# Patient Record
Sex: Male | Born: 1955 | Race: White | Hispanic: No | Marital: Married | Smoking: Never smoker
Health system: Southern US, Community
[De-identification: ages and names within clinical notes are randomized; demographics above are authoritative.]

## PROBLEM LIST (undated history)

## (undated) DIAGNOSIS — F329 Major depressive disorder, single episode, unspecified: Secondary | ICD-10-CM

## (undated) DIAGNOSIS — Z87438 Personal history of other diseases of male genital organs: Secondary | ICD-10-CM

## (undated) DIAGNOSIS — I38 Endocarditis, valve unspecified: Secondary | ICD-10-CM

## (undated) DIAGNOSIS — K219 Gastro-esophageal reflux disease without esophagitis: Secondary | ICD-10-CM

## (undated) DIAGNOSIS — F32A Depression, unspecified: Secondary | ICD-10-CM

## (undated) DIAGNOSIS — I639 Cerebral infarction, unspecified: Secondary | ICD-10-CM

## (undated) DIAGNOSIS — N2889 Other specified disorders of kidney and ureter: Secondary | ICD-10-CM

## (undated) DIAGNOSIS — C801 Malignant (primary) neoplasm, unspecified: Secondary | ICD-10-CM

## (undated) DIAGNOSIS — A4902 Methicillin resistant Staphylococcus aureus infection, unspecified site: Secondary | ICD-10-CM

## (undated) DIAGNOSIS — I10 Essential (primary) hypertension: Secondary | ICD-10-CM

## (undated) DIAGNOSIS — I340 Nonrheumatic mitral (valve) insufficiency: Secondary | ICD-10-CM

## (undated) DIAGNOSIS — E785 Hyperlipidemia, unspecified: Secondary | ICD-10-CM

## (undated) DIAGNOSIS — R7881 Bacteremia: Secondary | ICD-10-CM

## (undated) DIAGNOSIS — Z87442 Personal history of urinary calculi: Secondary | ICD-10-CM

## (undated) DIAGNOSIS — I219 Acute myocardial infarction, unspecified: Secondary | ICD-10-CM

## (undated) DIAGNOSIS — N189 Chronic kidney disease, unspecified: Secondary | ICD-10-CM

## (undated) HISTORY — DX: Endocarditis, valve unspecified: I38

## (undated) HISTORY — PX: TONSILLECTOMY: SUR1361

## (undated) HISTORY — PX: MITRAL VALVE REPAIR: SHX2039

## (undated) HISTORY — PX: NO PAST SURGERIES: SHX2092

## (undated) HISTORY — DX: Nonrheumatic mitral (valve) insufficiency: I34.0

## (undated) HISTORY — PX: CARDIAC CATHETERIZATION: SHX172

## (undated) HISTORY — DX: Acute myocardial infarction, unspecified: I21.9

## (undated) HISTORY — DX: Hyperlipidemia, unspecified: E78.5

## (undated) HISTORY — DX: Cerebral infarction, unspecified: I63.9

## (undated) HISTORY — DX: Other specified disorders of kidney and ureter: N28.89

## (undated) HISTORY — DX: Personal history of other diseases of male genital organs: Z87.438

---

## 2006-01-01 DIAGNOSIS — Z87442 Personal history of urinary calculi: Secondary | ICD-10-CM

## 2006-01-01 HISTORY — DX: Personal history of urinary calculi: Z87.442

## 2006-01-22 ENCOUNTER — Encounter: Admission: RE | Admit: 2006-01-22 | Discharge: 2006-01-22 | Payer: Self-pay | Admitting: Family Medicine

## 2006-03-04 ENCOUNTER — Ambulatory Visit (HOSPITAL_BASED_OUTPATIENT_CLINIC_OR_DEPARTMENT_OTHER): Admission: RE | Admit: 2006-03-04 | Discharge: 2006-03-04 | Payer: Self-pay | Admitting: Urology

## 2008-02-16 ENCOUNTER — Inpatient Hospital Stay (HOSPITAL_COMMUNITY): Admission: EM | Admit: 2008-02-16 | Discharge: 2008-03-08 | Payer: Self-pay | Admitting: Emergency Medicine

## 2008-02-16 ENCOUNTER — Ambulatory Visit: Payer: Self-pay | Admitting: Pulmonary Disease

## 2008-02-18 ENCOUNTER — Encounter (INDEPENDENT_AMBULATORY_CARE_PROVIDER_SITE_OTHER): Payer: Self-pay | Admitting: Cardiology

## 2008-02-19 ENCOUNTER — Ambulatory Visit: Payer: Self-pay | Admitting: Infectious Diseases

## 2008-02-19 ENCOUNTER — Encounter (INDEPENDENT_AMBULATORY_CARE_PROVIDER_SITE_OTHER): Payer: Self-pay | Admitting: Internal Medicine

## 2008-02-20 ENCOUNTER — Encounter (INDEPENDENT_AMBULATORY_CARE_PROVIDER_SITE_OTHER): Payer: Self-pay | Admitting: Neurology

## 2008-02-25 ENCOUNTER — Ambulatory Visit: Payer: Self-pay | Admitting: Physical Medicine & Rehabilitation

## 2008-03-08 ENCOUNTER — Inpatient Hospital Stay (HOSPITAL_COMMUNITY)
Admission: RE | Admit: 2008-03-08 | Discharge: 2008-04-30 | Payer: Self-pay | Admitting: Physical Medicine & Rehabilitation

## 2008-03-08 ENCOUNTER — Ambulatory Visit: Payer: Self-pay | Admitting: Physical Medicine & Rehabilitation

## 2008-03-17 ENCOUNTER — Encounter (INDEPENDENT_AMBULATORY_CARE_PROVIDER_SITE_OTHER): Payer: Self-pay | Admitting: Cardiology

## 2008-04-10 ENCOUNTER — Ambulatory Visit: Payer: Self-pay | Admitting: Physical Medicine & Rehabilitation

## 2008-04-28 ENCOUNTER — Ambulatory Visit: Payer: Self-pay | Admitting: Physical Medicine & Rehabilitation

## 2008-05-13 ENCOUNTER — Encounter: Payer: Self-pay | Admitting: Infectious Diseases

## 2008-06-11 ENCOUNTER — Ambulatory Visit: Payer: Self-pay | Admitting: Infectious Diseases

## 2008-06-11 DIAGNOSIS — L678 Other hair color and hair shaft abnormalities: Secondary | ICD-10-CM | POA: Insufficient documentation

## 2008-06-11 DIAGNOSIS — I10 Essential (primary) hypertension: Secondary | ICD-10-CM | POA: Insufficient documentation

## 2008-06-11 DIAGNOSIS — I330 Acute and subacute infective endocarditis: Secondary | ICD-10-CM | POA: Insufficient documentation

## 2008-06-11 DIAGNOSIS — L738 Other specified follicular disorders: Secondary | ICD-10-CM | POA: Insufficient documentation

## 2008-06-11 DIAGNOSIS — Z87442 Personal history of urinary calculi: Secondary | ICD-10-CM | POA: Insufficient documentation

## 2008-06-11 DIAGNOSIS — E785 Hyperlipidemia, unspecified: Secondary | ICD-10-CM | POA: Insufficient documentation

## 2008-06-18 ENCOUNTER — Ambulatory Visit: Payer: Self-pay | Admitting: Internal Medicine

## 2008-06-18 DIAGNOSIS — I6340 Cerebral infarction due to embolism of unspecified cerebral artery: Secondary | ICD-10-CM | POA: Insufficient documentation

## 2008-06-18 DIAGNOSIS — I2109 ST elevation (STEMI) myocardial infarction involving other coronary artery of anterior wall: Secondary | ICD-10-CM | POA: Insufficient documentation

## 2008-06-28 ENCOUNTER — Telehealth: Payer: Self-pay | Admitting: Internal Medicine

## 2008-06-28 ENCOUNTER — Ambulatory Visit: Payer: Self-pay | Admitting: Psychology

## 2008-06-30 ENCOUNTER — Encounter
Admission: RE | Admit: 2008-06-30 | Discharge: 2008-09-28 | Payer: Self-pay | Admitting: Physical Medicine & Rehabilitation

## 2008-07-07 ENCOUNTER — Ambulatory Visit: Payer: Self-pay | Admitting: Internal Medicine

## 2008-07-08 ENCOUNTER — Encounter: Payer: Self-pay | Admitting: Internal Medicine

## 2008-07-08 ENCOUNTER — Ambulatory Visit (HOSPITAL_COMMUNITY): Admission: RE | Admit: 2008-07-08 | Discharge: 2008-07-08 | Payer: Self-pay | Admitting: Internal Medicine

## 2008-07-16 ENCOUNTER — Encounter
Admission: RE | Admit: 2008-07-16 | Discharge: 2008-10-14 | Payer: Self-pay | Admitting: Physical Medicine & Rehabilitation

## 2008-07-20 ENCOUNTER — Ambulatory Visit: Payer: Self-pay | Admitting: Physical Medicine & Rehabilitation

## 2008-08-25 ENCOUNTER — Ambulatory Visit: Payer: Self-pay | Admitting: Physical Medicine & Rehabilitation

## 2008-08-25 DIAGNOSIS — I059 Rheumatic mitral valve disease, unspecified: Secondary | ICD-10-CM | POA: Insufficient documentation

## 2008-08-25 DIAGNOSIS — R079 Chest pain, unspecified: Secondary | ICD-10-CM | POA: Insufficient documentation

## 2008-08-25 DIAGNOSIS — R1310 Dysphagia, unspecified: Secondary | ICD-10-CM | POA: Insufficient documentation

## 2008-08-25 DIAGNOSIS — R569 Unspecified convulsions: Secondary | ICD-10-CM | POA: Insufficient documentation

## 2008-08-25 DIAGNOSIS — I82409 Acute embolism and thrombosis of unspecified deep veins of unspecified lower extremity: Secondary | ICD-10-CM | POA: Insufficient documentation

## 2008-08-26 ENCOUNTER — Ambulatory Visit: Payer: Self-pay | Admitting: Psychology

## 2008-08-26 ENCOUNTER — Ambulatory Visit: Payer: Self-pay | Admitting: Internal Medicine

## 2008-10-19 ENCOUNTER — Encounter
Admission: RE | Admit: 2008-10-19 | Discharge: 2008-12-22 | Payer: Self-pay | Admitting: Physical Medicine & Rehabilitation

## 2008-10-25 ENCOUNTER — Telehealth (INDEPENDENT_AMBULATORY_CARE_PROVIDER_SITE_OTHER): Payer: Self-pay | Admitting: *Deleted

## 2008-11-19 ENCOUNTER — Ambulatory Visit: Payer: Self-pay | Admitting: Physical Medicine & Rehabilitation

## 2009-01-07 ENCOUNTER — Ambulatory Visit: Payer: Self-pay | Admitting: Internal Medicine

## 2009-01-13 ENCOUNTER — Ambulatory Visit: Payer: Self-pay | Admitting: Internal Medicine

## 2009-01-17 LAB — CONVERTED CEMR LAB
AST: 17 units/L (ref 0–37)
LDL Cholesterol: 94 mg/dL (ref 0–99)
Total CHOL/HDL Ratio: 2
Triglycerides: 48 mg/dL (ref 0.0–149.0)

## 2009-02-08 ENCOUNTER — Telehealth (INDEPENDENT_AMBULATORY_CARE_PROVIDER_SITE_OTHER): Payer: Self-pay | Admitting: *Deleted

## 2009-02-18 ENCOUNTER — Encounter
Admission: RE | Admit: 2009-02-18 | Discharge: 2009-02-23 | Payer: Self-pay | Admitting: Physical Medicine & Rehabilitation

## 2009-02-23 ENCOUNTER — Ambulatory Visit: Payer: Self-pay | Admitting: Physical Medicine & Rehabilitation

## 2009-02-24 ENCOUNTER — Encounter: Payer: Self-pay | Admitting: Internal Medicine

## 2009-04-01 ENCOUNTER — Encounter: Payer: Self-pay | Admitting: Internal Medicine

## 2009-04-12 ENCOUNTER — Telehealth: Payer: Self-pay | Admitting: Internal Medicine

## 2009-04-25 ENCOUNTER — Telehealth: Payer: Self-pay | Admitting: Internal Medicine

## 2009-05-13 ENCOUNTER — Encounter (INDEPENDENT_AMBULATORY_CARE_PROVIDER_SITE_OTHER): Payer: Self-pay | Admitting: *Deleted

## 2009-07-18 ENCOUNTER — Ambulatory Visit (HOSPITAL_COMMUNITY): Admission: RE | Admit: 2009-07-18 | Discharge: 2009-07-18 | Payer: Self-pay | Admitting: Internal Medicine

## 2009-07-18 ENCOUNTER — Ambulatory Visit: Payer: Self-pay | Admitting: Internal Medicine

## 2009-07-18 ENCOUNTER — Encounter: Payer: Self-pay | Admitting: Internal Medicine

## 2009-07-18 ENCOUNTER — Ambulatory Visit: Payer: Self-pay

## 2009-07-18 ENCOUNTER — Ambulatory Visit: Payer: Self-pay | Admitting: Cardiovascular Disease

## 2010-01-13 ENCOUNTER — Encounter: Payer: Self-pay | Admitting: Internal Medicine

## 2010-01-13 ENCOUNTER — Ambulatory Visit
Admission: RE | Admit: 2010-01-13 | Discharge: 2010-01-13 | Payer: Self-pay | Source: Home / Self Care | Attending: Internal Medicine | Admitting: Internal Medicine

## 2010-01-19 ENCOUNTER — Ambulatory Visit
Admission: RE | Admit: 2010-01-19 | Discharge: 2010-01-19 | Payer: Self-pay | Source: Home / Self Care | Attending: Internal Medicine | Admitting: Internal Medicine

## 2010-01-19 ENCOUNTER — Other Ambulatory Visit: Payer: Self-pay | Admitting: Internal Medicine

## 2010-01-19 LAB — LIPID PANEL
Cholesterol: 241 mg/dL — ABNORMAL HIGH (ref 0–200)
HDL: 56.1 mg/dL (ref >39.00)
Total CHOL/HDL Ratio: 4
Triglycerides: 112 mg/dL (ref 0.0–149.0)
VLDL: 22.4 mg/dL (ref 0.0–40.0)

## 2010-01-19 LAB — LDL CHOLESTEROL, DIRECT: Direct LDL: 164.1 mg/dL

## 2010-01-23 ENCOUNTER — Encounter: Payer: Self-pay | Admitting: Internal Medicine

## 2010-02-02 NOTE — Letter (Signed)
Summary: Dr Marden Noble' Office Note  Dr Marden Noble' Office Note   Imported By: Roderic Ovens 03/17/2009 11:39:25  _____________________________________________________________________  External Attachment:    Type:   Image     Comment:   External Document

## 2010-02-02 NOTE — Progress Notes (Signed)
  Request recieved from [employer] forwarded to [facility] for processing' [name]  February 08, 2009 8:36 AM

## 2010-02-02 NOTE — Assessment & Plan Note (Signed)
Summary: 6 MO F/U-W/ECHO SAME DAY   Visit Type:  6 months follow up Primary Provider:  Dr gates  CC:  No complains.  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  I last saw him in clinic about 6 months ago. SInce seen he has done well from a cardiac standpoint.  He denies chest pains.  Breathing is good.  NO DOE.  No PND.  NO palpitations.  He works out at [employer] regularly.  Current Medications (verified): 1)  Pristiq 50 Mg Xr24h-Tab (Desvenlafaxine Succinate) .... Once Daily 2)  Zocor 80 Mg Tabs (Simvastatin) .Marland Kitchen.. 1 Tablet At Bedtime 3)  Metoprolol Succinate 25 Mg Xr24h-Tab (Metoprolol Succinate) .... Take 1/2 Tablet Once A Day 4)  Nortriptyline Hcl 50 Mg Caps (Nortriptyline Hcl) .... Take 1 Capsule By Mouth Once A Day 5)  Bayer Childrens Aspirin 81 Mg Chew (Aspirin) .... Take 1 Tablet By Mouth Once A Day  Allergies (verified): No Known Drug Allergies  Past History:  Past medical, surgical, family and social histories (including risk factors) reviewed, and no changes noted (except as noted below).  Past Medical History: Reviewed history from 08/25/2008 and no changes required. Endocarditis- MV vegitation Myocardial infarction Hemmorhagic CVA   Family History: Reviewed history from 06/11/2008 and no changes required. mom with RF as a child (died of heart problem in 63s).   Social History: Reviewed history from 06/18/2008 and no changes required. 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  Negative to the above problem except as noted above.  Vital Signs:  Patient profile:   55 year old male Height:      68 inches Weight:      174.25 pounds BMI:     26.59 Pulse rate:   74 / minute Pulse rhythm:   regular Resp:     18 per minute BP sitting:   120 / 80  (left arm) Cuff size:   large  Vitals Entered By: Vikki Ports (July 18, 2009 10:40 AM)  Physical Exam  Additional Exam:  Patient is in NAD HEENT:  Normocephalic, atraumatic. EOMI, PERRLA.  Neck: JVP is normal. No thyromegaly. No bruits.  Lungs: clear to auscultation. No rales no wheezes.  Heart: Regular rate and rhythm. Normal S1, S2. No S3.   No significant murmurs. PMI not displaced.  Abdomen:  Supple, nontender. Normal bowel sounds. No masses. No hepatomegaly.  Extremities:   Good distal pulses throughout. No lower extremity edema.  Musculoskeletal :moving all extremities.  Neuro:   alert and oriented x3.    EKG  Procedure date:  07/18/2009  Findings:      NSR.  74 bpm.  RBBB.  Nonspecific ST changes.  Impression & Recommendations:  Problem # 1:  MITRAL VALVE DISORDERS (ICD-424.0) Echo today shows 2 jets of MR that overall is moderate in severity.  LV is normal in size and function.  LA is mildly dilated but not signiificantly different from echo 1 year ago.    [name] is doing well clnically.  Exam is with triv murmur.  I have discussed with him signs of sudden worsening in  MR.  Encouraged him to stay active.  I would follow him in clinci in 6 months with f/u echo in 6 to 12 months.  ABx before dental and GI procedures.  Problem # 2:  HYPERLIPIDEMIA (  ICD-272.4) Patient is currently taking 40  mg   Lipid panel from January shows LDL 94, HDL 74.  Continue. His updated medication list for this problem includes:    Zocor 80 Mg Tabs (Simvastatin) ..... One half a  tablet at bedtime  Problem # 3:  MYOCARDIAL INFARCTION, ANTERIOR WALL, INITIAL EPISODE (ICD-410.11) Complication of endocarditis.  LV function is normal.  Continue current regimen. His updated medication list for this problem includes:    Metoprolol Succinate 25 Mg Xr24h-tab (Metoprolol succinate) .Marland Kitchen... Take 1/2 tablet once a day    Bayer Childrens Aspirin 81 Mg Chew (Aspirin) .Marland Kitchen... 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  Other Orders: EKG w/ Interpretation (93000)  Appended Document: 6 MO F/U-W/ECHO SAME DAY Error:  told patient to stop Zocor.  Complained of some achiness in legs that he thought was due to his trainer.  Will call back in 2 to 3 wks.

## 2010-02-02 NOTE — Progress Notes (Signed)
Summary: disability   Phone Note Other Incoming Call back at [phone]   Caller: [employer] Summary of Call: left msg on after hrs, please call regarding disability forms request ID [id number] Initial call taken by: Migdalia Dk,  April 12, 2009 8:12 AM  Follow-up for Phone Call        Called [employer]port to check on status of paperwork. Left message to have Fleet Contras call back when she is in office later today Dossie Arbour, RN, BSN  April 12, 2009 8:37 AM   Additional Follow-up for Phone Call Additional follow up Details #1::        [facility] with Fleet Contras to see if she is holding and papers on this patient. Additional Follow-up by: Suzan Garibaldi RN

## 2010-02-02 NOTE — Assessment & Plan Note (Signed)
Summary: f/u end of dec/sl   Visit Type:  Follow-up Primary Provider:  Dr gates  CC:  No complains.  History of Present Illness: Patient is a 55 year old with a history of MV endocarditis in 03/2008.  Complicated by MI with dissection of his LAD and also a CVA.  I last saw him in August.  When I saw him I recommended that he cut back on his Metoprolol to 12.5 once daily as he was complaining of some fatigue. On talking to him he says he is very foggy in the morning when he wakes up.  He is not dizzy.  Not dizzy at other times.  Is very actve, using elliptical 30/day.   He denies chest pain, no palpitations, no shortness of breath.  No fevers or chills.   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  He is driving.  Problems Prior to Update: 1)  Chest Pain  (ICD-786.50) 2)  Mitral Valve Prolapse  (ICD-424.0) 3)  Dvt  (ICD-453.40) 4)  Seizure Disorder  (ICD-780.39) 5)  Dysphagia Unspecified  (ICD-787.20) 6)  Hypertension  (ICD-401.9) 7)  Hyperlipidemia  (ICD-272.4) 8)  Cerebral Embolism With Cerebral Infarction  (ICD-434.11) 9)  Myocardial Infarction, Anterior Wall, Initial Episode  (ICD-410.11) 10)  Hyperlipidemia-mixed  (ICD-272.4) 11)  Folliculitis  (ICD-704.8) 12)  Endocarditis, Bacterial, Acute  (ICD-421.0) 13)  Nephrolithiasis, Hx of  (ICD-V13.01)  Current Medications (verified): 1)  Pristiq 50 Mg Xr24h-Tab (Desvenlafaxine Succinate) .... Once Daily 2)  Zocor 80 Mg Tabs (Simvastatin) .Marland Kitchen.. 1 Tablet At Bedtime 3)  Tegretol 200 Mg Tabs (Carbamazepine) .... Take 1 Tablet By Mouth Two Times A Day 4)  Metoprolol Succinate 25 Mg Xr24h-Tab (Metoprolol Succinate) .... Take 1/2 Tablet Once A Day 5)  Nortriptyline Hcl 50 Mg Caps (Nortriptyline Hcl) .... Take 1 Capsule By Mouth Once A Day  Allergies (verified): No Known Drug Allergies  Past History:  Past Medical History: Last updated: 08/25/2008 Endocarditis- MV vegitation Myocardial infarction Hemmorhagic CVA   Family  History: Last updated: 06/28/08 mom with RF as a child (died of heart problem in 17s).   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  Negative to the above problem except as noted above.  Vital Signs:  Patient profile:   55 year old male Height:      68 inches Weight:      144.25 pounds BMI:     22.01 Pulse rate:   71 / minute Pulse rhythm:   regular Resp:     18 per minute BP sitting:   110 / 74  (left arm) Cuff size:   regular  Vitals Entered By: Vikki Ports (January 07, 2009 11:01 AM)  Physical Exam  General:  Thin 55 year old in NAD Head:  normocephalic and atraumatic Mouth:  Teeth, gums and palate normal. Oral mucosa normal. Neck:  JVP is normla    No thyromegaly or bruits. Lungs:  CTA.  No rales or wheezes. Heart:  RRR.  S1, S2.  No S3.  Gr I/VI systolic murmur at apex. Abdomen:  Scaphoid,  no hepatomegaly or masses.  Nontender. Pulses:  pulses normal in all 4 extremities Extremities:  No clubbing or cyanosis. Neurologic:  Alert and oriented x 3.   EKG  Procedure date:  01/07/2009  Findings:      NSR.  71 bpm.  Impression & Recommendations:  Problem # 1:  MITRAL VALVE PROLAPSE (ICD-424.0) Patient with mitral valve endocarditis.  Last TEE in July with moderate MR.  I would recommend f/u echo in July of this year.  I will see him at that time.  Problem # 2:  MYOCARDIAL INFARCTION, ANTERIOR WALL, INITIAL EPISODE (ICD-410.11) This was most likely a complication of the endocarditis. He is on a very smal dose of b blocker.  I do not think his fogginess is secondary to this.  I would probabley continue.  He has plenty. Aggressive risk factor modificaiton.  Wil check lipids.  Stay active.  He has started a baby ASA  I recommended EcASA 81 mg. The following medications were removed from the medication list:    Toprol Xl 25 Mg Xr24h-tab (Metoprolol  succinate) ..... One half a tablet every day His updated medication list for this problem includes:    Metoprolol Succinate 25 Mg Xr24h-tab (Metoprolol succinate) .Marland Kitchen... Take 1/2 tablet once a day    Bayer Childrens Aspirin 81 Mg Chew (Aspirin)  Orders: EKG w/ Interpretation (93000)  Problem # 3:  HYPERTENSION (ICD-401.9) controlled.  Problem # 4:  HYPERLIPIDEMIA-MIXED (ICD-272.4) Will check fasting lipids and AST. His updated medication list for this problem includes:    Zocor 80 Mg Tabs (Simvastatin) .Marland Kitchen... 1 tablet at bedtime  Patient Instructions: 1)  Your physician recommends that you schedule a follow-up appointment in: July/2011 2)  Your physician recommends that you return for a FASTING lipid profile: And AST at [facility]  office next week.

## 2010-02-02 NOTE — Progress Notes (Signed)
Summary: disability forms  Phone Note From Other Clinic Call back at [phone]   Caller: [employer]/Summer Summary of Call: calling again(aft hrs msg) about disability forms ref [id number], has not heard anything and request to know if the dr is going to complete the forms or not Initial call taken by: Migdalia Dk,  April 25, 2009 3:48 PM  Follow-up for Phone Call        I will forward to [facility]- RN for Dr. Tenny Craw. Sherri Rad, RN, BSN  April 25, 2009 5:08 PM   Additional Follow-up for Phone Call Additional follow up Details #1::        Called [employer] and advised per Dr.Ross can't complete disability form because his inability to work is neuro related not cardiac. Additional Follow-up by: Suzan Garibaldi RN

## 2010-02-02 NOTE — Miscellaneous (Signed)
  Clinical Lists Changes  Medications: Added new medication of SIMVASTATIN 40 MG TABS (SIMVASTATIN) 1 tab at bedtime - Signed Rx of SIMVASTATIN 40 MG TABS (SIMVASTATIN) 1 tab at bedtime;  #30 x 6;  Signed;  Entered by: Layne Benton, RN, BSN;  Authorized by: Sherrill Raring, MD, [facility];  Method used: Electronically to [facility]*, [address], [place], [place]  [id number], Ph: [phone], Fax: [phone]    Prescriptions: SIMVASTATIN 40 MG TABS (SIMVASTATIN) 1 tab at bedtime  #30 x 6   Entered by:   Layne Benton, RN, BSN   Authorized by:   Sherrill Raring, MD, [facility]   Signed by:   Layne Benton, RN, BSN on 01/23/2010   Method used:   Electronically to        [facility]* (retail)       [address]       [place], [place]  [id number]       Ph: [phone]       Fax: [phone]   RxID:   [id number]

## 2010-02-02 NOTE — Letter (Signed)
Summary: Appointment - Reminder 2  [facility], Main Office  [address]   [place], [place] [address]   Phone: [phone]  Fax: [phone]     May 13, 2009 MRN: [id number]   [facility] [place] [address] [place], [place]  [address]   Dear [name],  Our records indicate that it is time to schedule a follow-up appointment with Dr. Tenny Craw. It is very important that we reach you to schedule this appointment. We look forward to participating in your health care needs. Please contact us at the number listed above at your earliest convenience to schedule your appointment.  If you are unable to make an appointment at this time, give [place] a call so we can update our records.     Sincerely,   Migdalia Dk [facility] Scheduling Team

## 2010-02-02 NOTE — Assessment & Plan Note (Signed)
Summary: 6 month rov.sl   Visit Type:  6 months follow up Primary Provider:  Dr gates  CC:  No complaints.  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  I last saw him in clinic about 6 months ago. SInce seen he has done well from a cardiac standpoint.  He denies chest pains.  Breathing is good.  NO DOE.  No PND.  NO palpitations.  He works out at [employer] regularly.  Eatting more.  Says his wt is up. He stopped Zocor after last visit.   Current Medications (verified): 1)  Metoprolol Succinate 25 Mg Xr24h-Tab (Metoprolol Succinate) .... Take 1/2 Tablet Once A Day 2)  Nortriptyline Hcl 25 Mg Caps (Nortriptyline Hcl) .... Take 1 Capsule By Mouth Once A Day 3)  Rapaflo 8 Mg Caps (Silodosin) .... Take 1 Capsule By Mouth Once A Day 4)  Pristiq 100 Mg Xr24h-Tab (Desvenlafaxine Succinate) .... Take 1 Tablet By Mouth Once A Day 5)  Vitamin D 2000 Unit Tabs (Cholecalciferol) .... Take 1 Tablet By Mouth Two Times A Day  Allergies (verified): No Known Drug Allergies  Past History:  Past medical, surgical, family and social histories (including risk factors) reviewed, and no changes noted (except as noted below).  Past Medical History: Endocarditis- MV vegitation Myocardial infarction Mitral regurgitation Hemmorhagic CVA  Dyslipidemia  Family History: Reviewed history from 06/11/2008 and no changes required. mom with RF as a child (died of heart problem in 60s).   Social History: Reviewed history from 06/18/2008 and no changes required. 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  Negatvie to the above problem except as noted above.  Vital Signs:  Patient profile:   55 year old male Height:      68 inches Weight:      196.75 pounds BMI:     30.02 Pulse rate:   64 / minute Pulse rhythm:   regular Resp:     18 per minute BP  sitting:   138 / 90  (left arm) Cuff size:   large  Vitals Entered By: Vikki Ports (January 13, 2010 2:15 PM)  Physical Exam  Additional Exam:  Patient is in NAD HEENT:  Normocephalic, atraumatic. EOMI, PERRLA.  Neck: JVP is normal. No thyromegaly. No bruits.  Lungs: clear to auscultation. No rales no wheezes.  Heart: Regular rate and rhythm. Normal S1, S2. No S3.   No significant murmurs. PMI not displaced.  Abdomen:  Supple, nontender. Normal bowel sounds. No masses. No hepatomegaly.  Extremities:   Good distal pulses throughout. No lower extremity edema.  Musculoskeletal :moving all extremities.  Neuro:   alert and oriented x3.    EKG  Procedure date:  01/13/2010  Findings:      NSR>  64 bpm.   RBBB  Impression & Recommendations:  Problem # 1:  MITRAL VALVE DISORDERS (ICD-424.0) Exam is rel unremarkabble.  Patient without SOB or palpitations.  Would follow periodic echoes.  Problem # 2:  HYPERTENSION (ICD-401.9) Follow.  BP on my check was better at 134/78  Problem # 3:  HYPERLIPIDEMIA (ICD-272.4) Sigmond is no longer taking Zocor, stopped after last visit.  Will check fasting lipids and get back with him.  Other Orders: EKG w/ Interpretation (93000)  Patient Instructions: 1)  Your physician recommends that you schedule a follow-up appointment in: 6 months 2)  Your physician recommends that you return for a FASTING lipid profile: [facility] lab next week 272 3)  Your physician recommends that you continue on your current medications as directed. Please refer to the Current Medication list given to you today.

## 2010-02-03 NOTE — Letter (Signed)
Summary: [name]   Imported By: Marylou Mccoy 06/08/2009 14:55:24  _____________________________________________________________________  External Attachment:    Type:   Image     Comment:   External Document

## 2010-03-24 ENCOUNTER — Other Ambulatory Visit: Payer: Self-pay | Admitting: *Deleted

## 2010-03-24 DIAGNOSIS — E782 Mixed hyperlipidemia: Secondary | ICD-10-CM

## 2010-03-27 ENCOUNTER — Other Ambulatory Visit: Payer: Self-pay

## 2010-04-12 LAB — GLUCOSE, CAPILLARY
Glucose-Capillary: 100 mg/dL — ABNORMAL HIGH (ref 70–99)
Glucose-Capillary: 104 mg/dL — ABNORMAL HIGH (ref 70–99)
Glucose-Capillary: 105 mg/dL — ABNORMAL HIGH (ref 70–99)
Glucose-Capillary: 106 mg/dL — ABNORMAL HIGH (ref 70–99)
Glucose-Capillary: 113 mg/dL — ABNORMAL HIGH (ref 70–99)
Glucose-Capillary: 133 mg/dL — ABNORMAL HIGH (ref 70–99)
Glucose-Capillary: 91 mg/dL (ref 70–99)
Glucose-Capillary: 97 mg/dL (ref 70–99)

## 2010-04-12 LAB — CBC
HCT: 40.1 % (ref 39.0–52.0)
Hemoglobin: 13.4 g/dL (ref 13.0–17.0)
Hemoglobin: 13.8 g/dL (ref 13.0–17.0)
Hemoglobin: 13.6 g/dL (ref 13.0–17.0)
MCHC: 34.5 g/dL (ref 30.0–36.0)
MCHC: 34.7 g/dL (ref 30.0–36.0)
MCV: 87.9 fL (ref 78.0–100.0)
RBC: 4.54 MIL/uL (ref 4.22–5.81)
RBC: 4.47 MIL/uL (ref 4.22–5.81)
RDW: 14.5 % (ref 11.5–15.5)
WBC: 7.4 10*3/uL (ref 4.0–10.5)

## 2010-04-12 LAB — BASIC METABOLIC PANEL
CO2: 23 mEq/L (ref 19–32)
CO2: 28 mEq/L (ref 19–32)
Calcium: 9.2 mg/dL (ref 8.4–10.5)
Calcium: 9.4 mg/dL (ref 8.4–10.5)
Chloride: 105 mEq/L (ref 96–112)
Creatinine, Ser: 0.92 mg/dL (ref 0.4–1.5)
GFR calc Af Amer: >60 mL/min (ref >60)
GFR calc Af Amer: >60 mL/min (ref >60)
GFR calc Af Amer: >60 mL/min (ref >60)
GFR calc non Af Amer: >60 mL/min (ref >60)
GFR calc non Af Amer: >60 mL/min (ref >60)
Glucose, Bld: 95 mg/dL (ref 70–99)
Potassium: 3.7 mEq/L (ref 3.5–5.1)
Potassium: 3.9 mEq/L (ref 3.5–5.1)
Sodium: 139 mEq/L (ref 135–145)
Sodium: 140 mEq/L (ref 135–145)
Sodium: 142 mEq/L (ref 135–145)

## 2010-04-13 LAB — GLUCOSE, CAPILLARY
Glucose-Capillary: 100 mg/dL — ABNORMAL HIGH (ref 70–99)
Glucose-Capillary: 100 mg/dL — ABNORMAL HIGH (ref 70–99)
Glucose-Capillary: 100 mg/dL — ABNORMAL HIGH (ref 70–99)
Glucose-Capillary: 101 mg/dL — ABNORMAL HIGH (ref 70–99)
Glucose-Capillary: 102 mg/dL — ABNORMAL HIGH (ref 70–99)
Glucose-Capillary: 103 mg/dL — ABNORMAL HIGH (ref 70–99)
Glucose-Capillary: 103 mg/dL — ABNORMAL HIGH (ref 70–99)
Glucose-Capillary: 103 mg/dL — ABNORMAL HIGH (ref 70–99)
Glucose-Capillary: 103 mg/dL — ABNORMAL HIGH (ref 70–99)
Glucose-Capillary: 104 mg/dL — ABNORMAL HIGH (ref 70–99)
Glucose-Capillary: 104 mg/dL — ABNORMAL HIGH (ref 70–99)
Glucose-Capillary: 104 mg/dL — ABNORMAL HIGH (ref 70–99)
Glucose-Capillary: 105 mg/dL — ABNORMAL HIGH (ref 70–99)
Glucose-Capillary: 105 mg/dL — ABNORMAL HIGH (ref 70–99)
Glucose-Capillary: 106 mg/dL — ABNORMAL HIGH (ref 70–99)
Glucose-Capillary: 106 mg/dL — ABNORMAL HIGH (ref 70–99)
Glucose-Capillary: 107 mg/dL — ABNORMAL HIGH (ref 70–99)
Glucose-Capillary: 107 mg/dL — ABNORMAL HIGH (ref 70–99)
Glucose-Capillary: 107 mg/dL — ABNORMAL HIGH (ref 70–99)
Glucose-Capillary: 109 mg/dL — ABNORMAL HIGH (ref 70–99)
Glucose-Capillary: 110 mg/dL — ABNORMAL HIGH (ref 70–99)
Glucose-Capillary: 111 mg/dL — ABNORMAL HIGH (ref 70–99)
Glucose-Capillary: 111 mg/dL — ABNORMAL HIGH (ref 70–99)
Glucose-Capillary: 112 mg/dL — ABNORMAL HIGH (ref 70–99)
Glucose-Capillary: 112 mg/dL — ABNORMAL HIGH (ref 70–99)
Glucose-Capillary: 113 mg/dL — ABNORMAL HIGH (ref 70–99)
Glucose-Capillary: 113 mg/dL — ABNORMAL HIGH (ref 70–99)
Glucose-Capillary: 114 mg/dL — ABNORMAL HIGH (ref 70–99)
Glucose-Capillary: 117 mg/dL — ABNORMAL HIGH (ref 70–99)
Glucose-Capillary: 117 mg/dL — ABNORMAL HIGH (ref 70–99)
Glucose-Capillary: 118 mg/dL — ABNORMAL HIGH (ref 70–99)
Glucose-Capillary: 119 mg/dL — ABNORMAL HIGH (ref 70–99)
Glucose-Capillary: 123 mg/dL — ABNORMAL HIGH (ref 70–99)
Glucose-Capillary: 123 mg/dL — ABNORMAL HIGH (ref 70–99)
Glucose-Capillary: 125 mg/dL — ABNORMAL HIGH (ref 70–99)
Glucose-Capillary: 125 mg/dL — ABNORMAL HIGH (ref 70–99)
Glucose-Capillary: 125 mg/dL — ABNORMAL HIGH (ref 70–99)
Glucose-Capillary: 84 mg/dL (ref 70–99)
Glucose-Capillary: 88 mg/dL (ref 70–99)
Glucose-Capillary: 91 mg/dL (ref 70–99)
Glucose-Capillary: 93 mg/dL (ref 70–99)
Glucose-Capillary: 93 mg/dL (ref 70–99)
Glucose-Capillary: 98 mg/dL (ref 70–99)
Glucose-Capillary: 99 mg/dL (ref 70–99)
Glucose-Capillary: 99 mg/dL (ref 70–99)
Glucose-Capillary: 102 mg/dL — ABNORMAL HIGH (ref 70–99)
Glucose-Capillary: 102 mg/dL — ABNORMAL HIGH (ref 70–99)
Glucose-Capillary: 103 mg/dL — ABNORMAL HIGH (ref 70–99)
Glucose-Capillary: 103 mg/dL — ABNORMAL HIGH (ref 70–99)
Glucose-Capillary: 106 mg/dL — ABNORMAL HIGH (ref 70–99)
Glucose-Capillary: 107 mg/dL — ABNORMAL HIGH (ref 70–99)
Glucose-Capillary: 107 mg/dL — ABNORMAL HIGH (ref 70–99)
Glucose-Capillary: 108 mg/dL — ABNORMAL HIGH (ref 70–99)
Glucose-Capillary: 110 mg/dL — ABNORMAL HIGH (ref 70–99)
Glucose-Capillary: 111 mg/dL — ABNORMAL HIGH (ref 70–99)
Glucose-Capillary: 111 mg/dL — ABNORMAL HIGH (ref 70–99)
Glucose-Capillary: 111 mg/dL — ABNORMAL HIGH (ref 70–99)
Glucose-Capillary: 112 mg/dL — ABNORMAL HIGH (ref 70–99)
Glucose-Capillary: 113 mg/dL — ABNORMAL HIGH (ref 70–99)
Glucose-Capillary: 113 mg/dL — ABNORMAL HIGH (ref 70–99)
Glucose-Capillary: 113 mg/dL — ABNORMAL HIGH (ref 70–99)
Glucose-Capillary: 115 mg/dL — ABNORMAL HIGH (ref 70–99)
Glucose-Capillary: 118 mg/dL — ABNORMAL HIGH (ref 70–99)
Glucose-Capillary: 118 mg/dL — ABNORMAL HIGH (ref 70–99)
Glucose-Capillary: 118 mg/dL — ABNORMAL HIGH (ref 70–99)
Glucose-Capillary: 118 mg/dL — ABNORMAL HIGH (ref 70–99)
Glucose-Capillary: 120 mg/dL — ABNORMAL HIGH (ref 70–99)
Glucose-Capillary: 120 mg/dL — ABNORMAL HIGH (ref 70–99)
Glucose-Capillary: 120 mg/dL — ABNORMAL HIGH (ref 70–99)
Glucose-Capillary: 121 mg/dL — ABNORMAL HIGH (ref 70–99)
Glucose-Capillary: 122 mg/dL — ABNORMAL HIGH (ref 70–99)
Glucose-Capillary: 123 mg/dL — ABNORMAL HIGH (ref 70–99)
Glucose-Capillary: 123 mg/dL — ABNORMAL HIGH (ref 70–99)
Glucose-Capillary: 124 mg/dL — ABNORMAL HIGH (ref 70–99)
Glucose-Capillary: 125 mg/dL — ABNORMAL HIGH (ref 70–99)
Glucose-Capillary: 127 mg/dL — ABNORMAL HIGH (ref 70–99)
Glucose-Capillary: 128 mg/dL — ABNORMAL HIGH (ref 70–99)
Glucose-Capillary: 132 mg/dL — ABNORMAL HIGH (ref 70–99)
Glucose-Capillary: 132 mg/dL — ABNORMAL HIGH (ref 70–99)
Glucose-Capillary: 133 mg/dL — ABNORMAL HIGH (ref 70–99)
Glucose-Capillary: 137 mg/dL — ABNORMAL HIGH (ref 70–99)
Glucose-Capillary: 137 mg/dL — ABNORMAL HIGH (ref 70–99)
Glucose-Capillary: 137 mg/dL — ABNORMAL HIGH (ref 70–99)
Glucose-Capillary: 141 mg/dL — ABNORMAL HIGH (ref 70–99)
Glucose-Capillary: 145 mg/dL — ABNORMAL HIGH (ref 70–99)
Glucose-Capillary: 149 mg/dL — ABNORMAL HIGH (ref 70–99)
Glucose-Capillary: 152 mg/dL — ABNORMAL HIGH (ref 70–99)
Glucose-Capillary: 153 mg/dL — ABNORMAL HIGH (ref 70–99)
Glucose-Capillary: 159 mg/dL — ABNORMAL HIGH (ref 70–99)
Glucose-Capillary: 84 mg/dL (ref 70–99)
Glucose-Capillary: 90 mg/dL (ref 70–99)
Glucose-Capillary: 92 mg/dL (ref 70–99)
Glucose-Capillary: 98 mg/dL (ref 70–99)

## 2010-04-13 LAB — COMPREHENSIVE METABOLIC PANEL
BUN: 26 mg/dL — ABNORMAL HIGH (ref 6–23)
Calcium: 9.7 mg/dL (ref 8.4–10.5)
Creatinine, Ser: 0.84 mg/dL (ref 0.4–1.5)
GFR calc non Af Amer: >60 mL/min (ref >60)
Glucose, Bld: 131 mg/dL — ABNORMAL HIGH (ref 70–99)
Sodium: 143 mEq/L (ref 135–145)
Total Protein: 6.9 g/dL (ref 6.0–8.3)

## 2010-04-13 LAB — URINALYSIS, ROUTINE W REFLEX MICROSCOPIC
Bilirubin Urine: NEGATIVE
Bilirubin Urine: NEGATIVE
Glucose, UA: NEGATIVE mg/dL
Glucose, UA: NEGATIVE mg/dL
Hgb urine dipstick: NEGATIVE
Ketones, ur: NEGATIVE mg/dL
Ketones, ur: NEGATIVE mg/dL
Nitrite: NEGATIVE
Specific Gravity, Urine: 1.02 (ref 1.005–1.030)
Specific Gravity, Urine: 1.026 (ref 1.005–1.030)
pH: 6 (ref 5.0–8.0)
pH: 6.5 (ref 5.0–8.0)

## 2010-04-13 LAB — BASIC METABOLIC PANEL
BUN: 13 mg/dL (ref 6–23)
BUN: 18 mg/dL (ref 6–23)
CO2: 28 mEq/L (ref 19–32)
CO2: 25 mEq/L (ref 19–32)
CO2: 26 mEq/L (ref 19–32)
CO2: 26 mEq/L (ref 19–32)
Calcium: 9 mg/dL (ref 8.4–10.5)
Calcium: 9.2 mg/dL (ref 8.4–10.5)
Calcium: 9.4 mg/dL (ref 8.4–10.5)
Calcium: 9.5 mg/dL (ref 8.4–10.5)
Chloride: 105 mEq/L (ref 96–112)
Chloride: 108 mEq/L (ref 96–112)
Chloride: 105 mEq/L (ref 96–112)
Chloride: 106 mEq/L (ref 96–112)
Chloride: 109 mEq/L (ref 96–112)
Chloride: 109 mEq/L (ref 96–112)
Creatinine, Ser: 0.79 mg/dL (ref 0.4–1.5)
Creatinine, Ser: 0.79 mg/dL (ref 0.4–1.5)
Creatinine, Ser: 0.84 mg/dL (ref 0.4–1.5)
Creatinine, Ser: 0.91 mg/dL (ref 0.4–1.5)
GFR calc Af Amer: >60 mL/min (ref >60)
GFR calc Af Amer: >60 mL/min (ref >60)
GFR calc Af Amer: >60 mL/min (ref >60)
GFR calc Af Amer: >60 mL/min (ref >60)
GFR calc Af Amer: >60 mL/min (ref >60)
GFR calc Af Amer: >60 mL/min (ref >60)
GFR calc Af Amer: >60 mL/min (ref >60)
GFR calc non Af Amer: >60 mL/min (ref >60)
GFR calc non Af Amer: >60 mL/min (ref >60)
GFR calc non Af Amer: >60 mL/min (ref >60)
GFR calc non Af Amer: >60 mL/min (ref >60)
GFR calc non Af Amer: >60 mL/min (ref >60)
GFR calc non Af Amer: >60 mL/min (ref >60)
GFR calc non Af Amer: >60 mL/min (ref >60)
Glucose, Bld: 101 mg/dL — ABNORMAL HIGH (ref 70–99)
Glucose, Bld: 109 mg/dL — ABNORMAL HIGH (ref 70–99)
Glucose, Bld: 109 mg/dL — ABNORMAL HIGH (ref 70–99)
Potassium: 3.8 mEq/L (ref 3.5–5.1)
Potassium: 3.9 mEq/L (ref 3.5–5.1)
Potassium: 3.3 mEq/L — ABNORMAL LOW (ref 3.5–5.1)
Potassium: 3.6 mEq/L (ref 3.5–5.1)
Sodium: 140 mEq/L (ref 135–145)
Sodium: 140 mEq/L (ref 135–145)
Sodium: 142 mEq/L (ref 135–145)
Sodium: 145 mEq/L (ref 135–145)

## 2010-04-13 LAB — CBC
HCT: 34.7 % — ABNORMAL LOW (ref 39.0–52.0)
HCT: 35.1 % — ABNORMAL LOW (ref 39.0–52.0)
HCT: 38.7 % — ABNORMAL LOW (ref 39.0–52.0)
Hemoglobin: 12.5 g/dL — ABNORMAL LOW (ref 13.0–17.0)
Hemoglobin: 12.8 g/dL — ABNORMAL LOW (ref 13.0–17.0)
Hemoglobin: 13.4 g/dL (ref 13.0–17.0)
MCHC: 34.6 g/dL (ref 30.0–36.0)
MCHC: 34.6 g/dL (ref 30.0–36.0)
MCHC: 34.7 g/dL (ref 30.0–36.0)
MCV: 86.1 fL (ref 78.0–100.0)
MCV: 86.3 fL (ref 78.0–100.0)
MCV: 86.3 fL (ref 78.0–100.0)
MCV: 87.2 fL (ref 78.0–100.0)
MCV: 87.8 fL (ref 78.0–100.0)
RBC: 4.07 MIL/uL — ABNORMAL LOW (ref 4.22–5.81)
RBC: 4.03 MIL/uL — ABNORMAL LOW (ref 4.22–5.81)
RBC: 4.06 MIL/uL — ABNORMAL LOW (ref 4.22–5.81)
RBC: 4.23 MIL/uL (ref 4.22–5.81)
RBC: 4.26 MIL/uL (ref 4.22–5.81)
RDW: 12.8 % (ref 11.5–15.5)
RDW: 13.5 % (ref 11.5–15.5)
WBC: 7.3 10*3/uL (ref 4.0–10.5)
WBC: 8.3 10*3/uL (ref 4.0–10.5)
WBC: 9 10*3/uL (ref 4.0–10.5)
WBC: 9.5 10*3/uL (ref 4.0–10.5)

## 2010-04-13 LAB — DIFFERENTIAL
Lymphs Abs: 1.5 10*3/uL (ref 0.7–4.0)
Monocytes Relative: 7 % (ref 3–12)
Neutro Abs: 7.1 10*3/uL (ref 1.7–7.7)
Neutrophils Relative %: 74 % (ref 43–77)

## 2010-04-13 LAB — URINE CULTURE

## 2010-04-13 LAB — HEMOGLOBIN A1C
Hgb A1c MFr Bld: 5.6 % (ref 4.6–6.1)
Mean Plasma Glucose: 114 mg/dL

## 2010-04-13 LAB — PREALBUMIN
Prealbumin: 25.7 mg/dL (ref 18.0–45.0)
Prealbumin: 24.8 mg/dL (ref 18.0–45.0)

## 2010-04-13 LAB — PHENYTOIN LEVEL, TOTAL: Phenytoin Lvl: <2.5 ug/mL — ABNORMAL LOW (ref 10.0–20.0)

## 2010-04-18 LAB — CBC
HCT: 29.8 % — ABNORMAL LOW (ref 39.0–52.0)
HCT: 31.6 % — ABNORMAL LOW (ref 39.0–52.0)
HCT: 32.7 % — ABNORMAL LOW (ref 39.0–52.0)
HCT: 34.2 % — ABNORMAL LOW (ref 39.0–52.0)
Hemoglobin: 10.3 g/dL — ABNORMAL LOW (ref 13.0–17.0)
Hemoglobin: 11.1 g/dL — ABNORMAL LOW (ref 13.0–17.0)
Hemoglobin: 11.5 g/dL — ABNORMAL LOW (ref 13.0–17.0)
Hemoglobin: 11.5 g/dL — ABNORMAL LOW (ref 13.0–17.0)
Hemoglobin: 11.9 g/dL — ABNORMAL LOW (ref 13.0–17.0)
Hemoglobin: 12 g/dL — ABNORMAL LOW (ref 13.0–17.0)
Hemoglobin: 12.9 g/dL — ABNORMAL LOW (ref 13.0–17.0)
Hemoglobin: 13.2 g/dL (ref 13.0–17.0)
Hemoglobin: 13.2 g/dL (ref 13.0–17.0)
MCHC: 34 g/dL (ref 30.0–36.0)
MCHC: 34.6 g/dL (ref 30.0–36.0)
MCHC: 34.7 g/dL (ref 30.0–36.0)
MCHC: 34.8 g/dL (ref 30.0–36.0)
MCHC: 35.1 g/dL (ref 30.0–36.0)
MCHC: 35.1 g/dL (ref 30.0–36.0)
MCHC: 35.2 g/dL (ref 30.0–36.0)
MCHC: 35.3 g/dL (ref 30.0–36.0)
MCHC: 35.3 g/dL (ref 30.0–36.0)
MCHC: 35.3 g/dL (ref 30.0–36.0)
MCV: 85.5 fL (ref 78.0–100.0)
MCV: 85.9 fL (ref 78.0–100.0)
MCV: 86.3 fL (ref 78.0–100.0)
MCV: 87.3 fL (ref 78.0–100.0)
MCV: 87.8 fL (ref 78.0–100.0)
MCV: 88 fL (ref 78.0–100.0)
Platelets: 242 10*3/uL (ref 150–400)
Platelets: 261 10*3/uL (ref 150–400)
Platelets: 337 10*3/uL (ref 150–400)
Platelets: 411 10*3/uL — ABNORMAL HIGH (ref 150–400)
Platelets: 414 10*3/uL — ABNORMAL HIGH (ref 150–400)
Platelets: 481 10*3/uL — ABNORMAL HIGH (ref 150–400)
Platelets: 494 10*3/uL — ABNORMAL HIGH (ref 150–400)
RBC: 4.25 MIL/uL (ref 4.22–5.81)
RBC: 3.38 MIL/uL — ABNORMAL LOW (ref 4.22–5.81)
RBC: 4 MIL/uL — ABNORMAL LOW (ref 4.22–5.81)
RBC: 4.12 MIL/uL — ABNORMAL LOW (ref 4.22–5.81)
RBC: 4.24 MIL/uL (ref 4.22–5.81)
RBC: 4.41 MIL/uL (ref 4.22–5.81)
RDW: 12.4 % (ref 11.5–15.5)
RDW: 12.4 % (ref 11.5–15.5)
RDW: 12.5 % (ref 11.5–15.5)
RDW: 12.5 % (ref 11.5–15.5)
RDW: 12.5 % (ref 11.5–15.5)
RDW: 12.7 % (ref 11.5–15.5)
RDW: 12.8 % (ref 11.5–15.5)
RDW: 12.9 % (ref 11.5–15.5)
RDW: 13 % (ref 11.5–15.5)
WBC: 11 10*3/uL — ABNORMAL HIGH (ref 4.0–10.5)
WBC: 11.2 10*3/uL — ABNORMAL HIGH (ref 4.0–10.5)
WBC: 12.7 10*3/uL — ABNORMAL HIGH (ref 4.0–10.5)

## 2010-04-18 LAB — GLUCOSE, CAPILLARY
Glucose-Capillary: 102 mg/dL — ABNORMAL HIGH (ref 70–99)
Glucose-Capillary: 104 mg/dL — ABNORMAL HIGH (ref 70–99)
Glucose-Capillary: 107 mg/dL — ABNORMAL HIGH (ref 70–99)
Glucose-Capillary: 107 mg/dL — ABNORMAL HIGH (ref 70–99)
Glucose-Capillary: 107 mg/dL — ABNORMAL HIGH (ref 70–99)
Glucose-Capillary: 109 mg/dL — ABNORMAL HIGH (ref 70–99)
Glucose-Capillary: 109 mg/dL — ABNORMAL HIGH (ref 70–99)
Glucose-Capillary: 110 mg/dL — ABNORMAL HIGH (ref 70–99)
Glucose-Capillary: 111 mg/dL — ABNORMAL HIGH (ref 70–99)
Glucose-Capillary: 112 mg/dL — ABNORMAL HIGH (ref 70–99)
Glucose-Capillary: 113 mg/dL — ABNORMAL HIGH (ref 70–99)
Glucose-Capillary: 116 mg/dL — ABNORMAL HIGH (ref 70–99)
Glucose-Capillary: 118 mg/dL — ABNORMAL HIGH (ref 70–99)
Glucose-Capillary: 118 mg/dL — ABNORMAL HIGH (ref 70–99)
Glucose-Capillary: 119 mg/dL — ABNORMAL HIGH (ref 70–99)
Glucose-Capillary: 120 mg/dL — ABNORMAL HIGH (ref 70–99)
Glucose-Capillary: 122 mg/dL — ABNORMAL HIGH (ref 70–99)
Glucose-Capillary: 125 mg/dL — ABNORMAL HIGH (ref 70–99)
Glucose-Capillary: 125 mg/dL — ABNORMAL HIGH (ref 70–99)
Glucose-Capillary: 129 mg/dL — ABNORMAL HIGH (ref 70–99)
Glucose-Capillary: 130 mg/dL — ABNORMAL HIGH (ref 70–99)
Glucose-Capillary: 133 mg/dL — ABNORMAL HIGH (ref 70–99)
Glucose-Capillary: 133 mg/dL — ABNORMAL HIGH (ref 70–99)
Glucose-Capillary: 147 mg/dL — ABNORMAL HIGH (ref 70–99)
Glucose-Capillary: 154 mg/dL — ABNORMAL HIGH (ref 70–99)
Glucose-Capillary: 98 mg/dL (ref 70–99)
Glucose-Capillary: 99 mg/dL (ref 70–99)
Glucose-Capillary: 99 mg/dL (ref 70–99)

## 2010-04-18 LAB — BASIC METABOLIC PANEL
BUN: 9 mg/dL (ref 6–23)
CO2: 23 mEq/L (ref 19–32)
CO2: 24 mEq/L (ref 19–32)
CO2: 25 mEq/L (ref 19–32)
CO2: 25 mEq/L (ref 19–32)
CO2: 27 mEq/L (ref 19–32)
Calcium: 8.1 mg/dL — ABNORMAL LOW (ref 8.4–10.5)
Calcium: 8.2 mg/dL — ABNORMAL LOW (ref 8.4–10.5)
Calcium: 8.4 mg/dL (ref 8.4–10.5)
Chloride: 104 mEq/L (ref 96–112)
Chloride: 104 mEq/L (ref 96–112)
Chloride: 115 mEq/L — ABNORMAL HIGH (ref 96–112)
Creatinine, Ser: 0.72 mg/dL (ref 0.4–1.5)
Creatinine, Ser: 0.8 mg/dL (ref 0.4–1.5)
Creatinine, Ser: 0.81 mg/dL (ref 0.4–1.5)
Creatinine, Ser: 0.93 mg/dL (ref 0.4–1.5)
GFR calc Af Amer: >60 mL/min (ref >60)
GFR calc Af Amer: >60 mL/min (ref >60)
GFR calc Af Amer: >60 mL/min (ref >60)
GFR calc non Af Amer: >60 mL/min (ref >60)
GFR calc non Af Amer: >60 mL/min (ref >60)
Glucose, Bld: 104 mg/dL — ABNORMAL HIGH (ref 70–99)
Glucose, Bld: 104 mg/dL — ABNORMAL HIGH (ref 70–99)
Glucose, Bld: 105 mg/dL — ABNORMAL HIGH (ref 70–99)
Glucose, Bld: 110 mg/dL — ABNORMAL HIGH (ref 70–99)
Glucose, Bld: 149 mg/dL — ABNORMAL HIGH (ref 70–99)
Potassium: 3.7 mEq/L (ref 3.5–5.1)
Sodium: 139 mEq/L (ref 135–145)
Sodium: 141 mEq/L (ref 135–145)
Sodium: 143 mEq/L (ref 135–145)

## 2010-04-18 LAB — DIFFERENTIAL
Basophils Absolute: 0.1 10*3/uL (ref 0.0–0.1)
Basophils Relative: 1 % (ref 0–1)
Eosinophils Absolute: 0.1 10*3/uL (ref 0.0–0.7)
Eosinophils Relative: 1 % (ref 0–5)
Lymphocytes Relative: 6 % — ABNORMAL LOW (ref 12–46)
Lymphs Abs: 0.7 10*3/uL (ref 0.7–4.0)
Monocytes Absolute: 0.9 10*3/uL (ref 0.1–1.0)
Monocytes Absolute: 1.3 10*3/uL — ABNORMAL HIGH (ref 0.1–1.0)
Neutro Abs: 6.9 10*3/uL (ref 1.7–7.7)
Neutrophils Relative %: 70 % (ref 43–77)

## 2010-04-18 LAB — URINALYSIS, ROUTINE W REFLEX MICROSCOPIC
Bilirubin Urine: NEGATIVE
Glucose, UA: NEGATIVE mg/dL
Ketones, ur: NEGATIVE mg/dL
Leukocytes, UA: NEGATIVE
Nitrite: NEGATIVE
Nitrite: NEGATIVE
Protein, ur: NEGATIVE mg/dL
Urobilinogen, UA: 0.2 mg/dL (ref 0.0–1.0)
pH: 6 (ref 5.0–8.0)
pH: 6.5 (ref 5.0–8.0)

## 2010-04-18 LAB — BLOOD GAS, ARTERIAL
Acid-base deficit: 0.3 mmol/L (ref 0.0–2.0)
Acid-base deficit: 1.1 mmol/L (ref 0.0–2.0)
Bicarbonate: 22.1 mEq/L (ref 20.0–24.0)
Drawn by: 24513
FIO2: 0.3 %
FIO2: 30 %
MECHVT: 600 mL
MECHVT: 600 mL
O2 Saturation: 99 %
Patient temperature: 99.2
RATE: 15 resp/min
TCO2: 23 mmol/L (ref 0–100)
pCO2 arterial: 31.3 mmHg — ABNORMAL LOW (ref 35.0–45.0)

## 2010-04-18 LAB — ANA: Anti Nuclear Antibody(ANA): NEGATIVE

## 2010-04-18 LAB — LIPID PANEL
HDL: 27 mg/dL — ABNORMAL LOW (ref >39)
Total CHOL/HDL Ratio: 4.6 RATIO
VLDL: 20 mg/dL (ref 0–40)

## 2010-04-18 LAB — COMPREHENSIVE METABOLIC PANEL
ALT: 30 U/L (ref 0–53)
ALT: 29 U/L (ref 0–53)
AST: 30 U/L (ref 0–37)
AST: 25 U/L (ref 0–37)
Albumin: 3 g/dL — ABNORMAL LOW (ref 3.5–5.2)
Alkaline Phosphatase: 55 U/L (ref 39–117)
CO2: 28 mEq/L (ref 19–32)
CO2: 24 mEq/L (ref 19–32)
Chloride: 102 mEq/L (ref 96–112)
Chloride: 103 mEq/L (ref 96–112)
Creatinine, Ser: 0.97 mg/dL (ref 0.4–1.5)
GFR calc Af Amer: >60 mL/min (ref >60)
GFR calc Af Amer: >60 mL/min (ref >60)
GFR calc non Af Amer: >60 mL/min (ref >60)
GFR calc non Af Amer: >60 mL/min (ref >60)
Glucose, Bld: 103 mg/dL — ABNORMAL HIGH (ref 70–99)
Potassium: 3.8 mEq/L (ref 3.5–5.1)
Sodium: 138 mEq/L (ref 135–145)
Sodium: 134 mEq/L — ABNORMAL LOW (ref 135–145)
Total Bilirubin: 0.7 mg/dL (ref 0.3–1.2)
Total Bilirubin: 0.7 mg/dL (ref 0.3–1.2)

## 2010-04-18 LAB — PROTIME-INR
INR: 1.3 (ref 0.00–1.49)
Prothrombin Time: 14.5 seconds (ref 11.6–15.2)

## 2010-04-18 LAB — CRYOGLOBULIN

## 2010-04-18 LAB — PHENYTOIN LEVEL, FREE AND TOTAL
Phenytoin Bound: 3.3 ug/mL
Phenytoin, Free: 0.44 ug/mL — ABNORMAL LOW (ref 1.00–2.00)
Phenytoin, Free: <0.02 ug/mL (ref 1.00–2.00)
Phenytoin, Total: 3.7 ug/mL — ABNORMAL LOW (ref 10.0–20.0)
Phenytoin, Total: <1.1 ug/mL (ref 10.0–20.0)

## 2010-04-18 LAB — POCT CARDIAC MARKERS
CKMB, poc: 3.1 ng/mL (ref 1.0–8.0)
Troponin i, poc: 0.13 ng/mL — ABNORMAL HIGH (ref 0.00–0.09)

## 2010-04-18 LAB — CARDIAC PANEL(CRET KIN+CKTOT+MB+TROPI)
CK, MB: 107.1 ng/mL — ABNORMAL HIGH (ref 0.3–4.0)
CK, MB: 6.6 ng/mL — ABNORMAL HIGH (ref 0.3–4.0)
Relative Index: 2.1 (ref 0.0–2.5)
Total CK: 138 U/L (ref 7–232)
Total CK: 641 U/L — ABNORMAL HIGH (ref 7–232)
Total CK: 883 U/L — ABNORMAL HIGH (ref 7–232)
Troponin I: 37.82 ng/mL (ref 0.00–0.06)
Troponin I: 7.58 ng/mL (ref 0.00–0.06)
Troponin I: 9.77 ng/mL (ref 0.00–0.06)

## 2010-04-18 LAB — CULTURE, BLOOD (ROUTINE X 2)

## 2010-04-18 LAB — CK TOTAL AND CKMB (NOT AT ARMC)
CK, MB: 15.9 ng/mL — ABNORMAL HIGH (ref 0.3–4.0)
CK, MB: 16 ng/mL — ABNORMAL HIGH (ref 0.3–4.0)
Relative Index: 9.3 — ABNORMAL HIGH (ref 0.0–2.5)
Relative Index: 3.8 — ABNORMAL HIGH (ref 0.0–2.5)
Total CK: 344 U/L — ABNORMAL HIGH (ref 7–232)
Total CK: 418 U/L — ABNORMAL HIGH (ref 7–232)

## 2010-04-18 LAB — URINE CULTURE: Colony Count: NO GROWTH

## 2010-04-18 LAB — URINE MICROSCOPIC-ADD ON

## 2010-04-18 LAB — PHOSPHORUS: Phosphorus: 3 mg/dL (ref 2.3–4.6)

## 2010-04-18 LAB — TROPONIN I
Troponin I: 1.4 ng/mL (ref 0.00–0.06)
Troponin I: 12.85 ng/mL (ref 0.00–0.06)
Troponin I: 14.48 ng/mL (ref 0.00–0.06)
Troponin I: 5.29 ng/mL (ref 0.00–0.06)

## 2010-04-18 LAB — PHENYTOIN LEVEL, TOTAL
Phenytoin Lvl: 5.5 ug/mL — ABNORMAL LOW (ref 10.0–20.0)
Phenytoin Lvl: 7.2 ug/mL — ABNORMAL LOW (ref 10.0–20.0)

## 2010-04-18 LAB — D-DIMER, QUANTITATIVE: D-Dimer, Quant: 0.78 ug/mL-FEU — ABNORMAL HIGH (ref 0.00–0.48)

## 2010-04-18 LAB — SEDIMENTATION RATE: Sed Rate: 18 mm/hr — ABNORMAL HIGH (ref 0–16)

## 2010-04-18 LAB — MAGNESIUM: Magnesium: 2.1 mg/dL (ref 1.5–2.5)

## 2010-04-18 LAB — HEPARIN LEVEL (UNFRACTIONATED)
Heparin Unfractionated: 0.24 IU/mL — ABNORMAL LOW (ref 0.30–0.70)
Heparin Unfractionated: 0.32 IU/mL (ref 0.30–0.70)

## 2010-04-18 LAB — C4 COMPLEMENT: Complement C4, Body Fluid: 32 mg/dL (ref 16–47)

## 2010-04-18 LAB — CARDIOLIPIN ANTIBODIES, IGM+IGG: Anticardiolipin IgM: <7 [MPL'U] — ABNORMAL LOW (ref <10)

## 2010-05-16 NOTE — H&P (Signed)
NAMETHEADOR, JEZEWSKI               ACCOUNT NO.:  [id number]   MEDICAL RECORD NO.:  [id number]          PATIENT TYPE:  IPS   LOCATION:  4005                         FACILITY:  [facility]   PHYSICIAN:  Ranelle Oyster, M.D.DATE OF BIRTH:  [birth year]   DATE OF ADMISSION:  03/08/2008  DATE OF DISCHARGE:                              HISTORY & PHYSICAL   PRIMARY CARE PHYSICIAN:  [name], MD   CARDIOLOGIST:  [name], MD   NEUROLOGIST:  Melvyn Novas, MD   CHIEF COMPLAINT:  Bicerebral infarct with aphasia and weakness, left  more than right.   HISTORY OF PRESENT ILLNESS:  This is a 55 year old white male with the  mitral valve prolapse and hypertension who was admitted on February 15,  after recent viral illness and fatigue.  He developed chest pain the  evening after his meal.  He received nitroglycerin x2 in the emergency  room.  Chest x-ray was negative.  EKG showed right bundle-branch block  with diffuse ST depression.  Cardiac panel showed troponin of 37.82, CK-  MB of 107.  He is placed on IV heparin.  Cardiac cath with findings of  spontaneous proximal left anterior descending dissection with filling  defect.  No significant CAD was noted.  The patient was placed on  aspirin and Plavix.  On February 18, the patient developed aphasia and  left-sided weakness.  MRI revealed a left temporal lobe infarct.  Neurology saw the patient and TPA was initiated for presumed embolic  ischemic stroke.  He received an initial bolus and then developed a  seizure and was loaded with Ativan and intubated.  CT was negative for  hemorrhagic conversion and TPA was completed.  Followup head CT and CT  angio on February 19, showed 3 hemorrhages in the right basal ganglia,  left frontal lobe, and left posterior fossa.  Neurosurgery saw the  patient and recommended conservative care and monitoring.  EEG showed  epileptiform discharges and diffuse neuronal dysfunction consistent with  a  toxic and metabolic encephalopathy.  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  Hospital course was complicated by gram-positive  cocci, blood culture, and endocarditis.  ID saw the patient and Zosyn  was changed to IV Rocephin on March 2, for 4 more weeks from March 04, 2008.  Due to severe dysphagia, G tube was placed on March 2, by  Interventional Radiology and the patient remains n.p.o.  Subcu Lovenox  was then initiated for DVT prophylaxis.  Rehab saw this patient and felt  that ultimately he could benefit from the inpatient rehab admission and  the patient was brought here today.   REVIEW OF SYSTEMS:  Notable for cough.  He has had an incontinence of  bowel.  Foley catheter remains in place.  He has been lethargic at  times.  Full reviews in the written H&P.  Other pertinent positives are  as above.   PAST MEDICAL HISTORY:  Positive for mitral valve prolapse, hypertension,  BPH, kidney stone, and hyperlipidemia.  Occasional alcohol use and  negative tobacco.   FAMILY HISTORY:  Unknown.   SOCIAL HISTORY:  The patient is married to a local attorney with good  family support.   ALLERGIES:  None.   MEDICATIONS:  Crestor and aspirin.   LABORATORY DATA:  Creatinine 0.8.  Hemoglobin 12.4, white count 9,  platelets 362.   PHYSICAL EXAMINATION:  VITAL SIGNS:  Blood pressure is 135/88, pulse is  95 and temperature 97.9, respiratory rate is 20, sating 95% on room air.  GENERAL:  The patient is lying in bed awake with a right gaze  preference.  HEENT:  Pupils are reactive to light and the patient blinks to visual  stimuli.  He notes throat exam is notable for dry oral mucosa.  He has  poor tongue control and mouth-breathes quite a bit.  NECK:  Supple without JVD or lymphadenopathy.  CHEST:  Clear to auscultation without wheezes, rales, or rhonchi.  HEART:  Regular rate and rhythm without murmur, rubs, or gallops.  EXTREMITIES:  No clubbing, cyanosis, or edema.  ABDOMEN:   Soft, nontender with the G tube site clean and intact.  SKIN:  Notable for bruising in the right groin and a few other areas of  bruising on the foot.  Otherwise he was intact.  NEUROLOGIC:  Cranial nerves II-XII revealed left central VII sign.  He  does appear to have closed vision, although when it left in attention,  interferes with full visual fields.  The patient is globally aphasic,  although appears to have more receptive skills than expressive.  He is  able to follow a few one-step commands, although inconsistently so.  The  patient withdrawals somewhat to pain on the left side.  He has  withdrawals on the right side.  The right arm is moving freely in 4-5/5.  Right lower extremity is also 4-5/5.  Left lower extremity had trace  movement with underlying tone at 2+/4.  Left lower extremity also with  minimal movements with underlying tone perhaps at 1/4.  Judging,  orientation, memory, and mood are all unable to be assessed due to the  patient's profound aphasia today.   POSTADMISSION PHYSICIAN EVALUATION:  1. Functional deficit secondary to bicerebral infarcts due to embolus.      The patient was significant left-sided spastic hemiparesis global      aphasia, dysphagia, hemisensory loss, and spatial deficits.  2. The patient is admitted to receive collaborative interdisciplinary      care between the physiatrist, rehab nursing staff, and therapy      team.  3. The patient's level of medical complexity and substantial therapy      needs to contact so that medical necessity cannot be provided to a      less intensity of care.  4. The patient experienced substantial functional loss from his      baseline.  Upon functional assessment at the time of preadmission      screening on February 24, the patient had not been yet seen by      Physical or Occupational Therapy.  As of most recent therapy eval      within the last 24 hours, the patient's total assist with bed      mobility  performing 20%.  He has been total assist with transfers      (0%).  Gait has not been tested.  ADL's are total assist.  Judging      by the patient's diagnosis, physical exam, and functional history,      he has potential for functional progress  with result in measurable      gains on rehab.  These gains will be a substantial and practical      use upon discharge to home in facilitating mobility, self-care,      etc.  Interim changes in the medical status since our preadmission      consultation are detailed in history of present illness.  5. Physiatrist will provide 24-hour management of medical needs as      well as oversight of therapy plan/treatment and provide guidance as      appropriate regarding interaction of the two.  Medical problem list      and plan are listed below.  6. The 24-hour rehab nursing will assist in management of the      patient's skin care needs as well as appropriate nutrition,      medication management, pain management, safety awareness, family      education, bowel and bladder management, and integration of therapy      concepts and techniques.  7. PT will assess and treat for lower extremity strength, spasticity      control, appropriate splinting, adaptive devices pre-gait training,      family education, and safety with goals at a min assist level.  8. OT will assess and treat for upper extremity use and ADLs in the      setting of his language and cognitive deficits.  It will also work      on Press photographer and spasticity management with      adaptive equipment being added in as appropriate.  Goals are min to      mod assist for basic mobility.  9. Speech language pathology will assess and treat for severe      dysphasia and aphasia with cognitive remediation as possible.      Goals are min assist with language and cognition.  Perhaps      supervision with swallowing.  10.Case management social worker will assess and treat for       psychosocial issues and discharge planning.  11.A team conference will be held weekly to assess progress towards      goals and determine barriers of discharge.  12.The patient has demonstrated sufficient medical stability and      exercise capacity to tolerate at least 3 hours of therapy per day      at least 5 days per week.  We may have to consider a less      aggressive 7-day approach for this patient depending on initial      tolerance.  13.Estimated length of stay is 3-4 weeks.  Prognosis fair.   MEDICAL PROBLEM LIST AND PLAN:  1. Dysphagia:  Continue n.p.o. with gastrostomy tube feeds changing to      bolus schedule.  He will likely need long-term bolus feeds due to      severe oropharyngeal dysphagia.  2. Cardiac:  Continue Lopressor 50 mg b.i.d. via tube.  Watch closely      for I's and O's.  Continue Isordil as well as Crestor.  Monitor for      signs and symptoms of fluid overload, shortness breath, chest pain,      etc.  3. Hyperlipidemia:  Crestor.  4. Seizure prophylaxis:  Continue Keppra 250 mg b.i.d. for now.  5. Sleep:  Continue low-dose Seroquel on 25 mg at bedtime in light to      wean this off as possible.  6. Deep vein thrombosis prophylaxis, subcu  Lovenox.  7. Tone control:  We will start with splinting of the left leg and arm      at night in resting positions.  The patient is likely to develop      severe contractures due to his early emerging tone.      Ranelle Oyster, M.D.  Electronically Signed     ZTS/MEDQ  D:  03/08/2008  T:  03/09/2008  Job:  [id number]

## 2010-05-16 NOTE — Discharge Summary (Signed)
[name], [name]               ACCOUNT NO.:  [id number]   MEDICAL RECORD NO.:  [id number]          PATIENT TYPE:  INP   LOCATION:  2002                         FACILITY:  [facility]   PHYSICIAN:  [name], M.D.DATE OF BIRTH:  1955/09/14   DATE OF ADMISSION:  02/16/2008  DATE OF DISCHARGE:  03/08/2008                               DISCHARGE SUMMARY   DISCHARGE DIAGNOSES:  1. Streptococcus gondii endocarditis with plans for 45 days of total      intravenous antibiotic therapy.  Currently on day 19 of 45      intravenous Rocephin.  2. 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  3. Acute coronary syndrome with a small dissection of the left      anterior descending artery at admission.  4. Severe neurologic sequelae secondary to bilateral thromboembolic      strokes with left hemiparesis, quite probable cognitive      dysfunction, and aphasia.  5. Aspiration pneumonia, resolved.  6. 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  Currently      on Keppra therapy.  7. Hypokalemia, resolved.  8. Percutaneous endoscopic gastrostomy tube placement for nutrition      with ongoing evaluations of swallowing function.  9. Bilateral hemispheric hemorrhage following tissue plasminogen      activator therapy on March 18, 2008, for bilateral embolic strokes.   PROCEDURES:  1. Cardiac catheterization, February 17, 2008 per Dr. [name]      revealed normal left main artery.  Left anterior descending artery      revealed in the proximal segment a small dissection flap,      otherwise, no other angiographically significant coronary artery      disease.  Circumflex artery was normal.  Right coronary artery was      normal and left ventriculogram revealed a small basal anterior wall      akinetic and slightly aneurysmal segment.  Otherwise, normal LV      function.  Ejection fraction of 55%.  2. CT without  contrast of the head performed at 10:20 a.m. on February 19, 2008, revealed no evidence of acute cortical infarct and no      acute intracranial abnormality.  CT angiogram of the chest and neck      performed on February 19, 2008, revealed no evidence of arterial      dissection or other acute vascular abnormality in the neck.  There      was also no aortic dissection or acute vascular findings in the      chest.  There was a 6-mm noncalcified pulmonary nodule in the      medial left lung apex.  Repeat CT scan of the head without contrast      at 12:40 p.m. on February 19, 2008, revealed a 5-mm hypodense      lesion in the cortex of the precentral gyrus, but no other mass      effect or acute findings identified.  The MRI of the brain  performed at 10:45 a.m. on February 19, 2008, revealed a confluent      area of restricted diffusion in the left superior temporal gyrus      corresponding with the patient's acute onset of Wernicke      dysphagia/aphasia.  Pattern was felt to be most compatible with      embolic phenomenon from a proximal cardiac or aortic source.   A portable ultrasound of the aorta on February 20, 2008, revealed the  abdominal aorta to be normal with a transverse dimension of 2.2 cm.  Repeat CT angiogram of the head and neck on February 20, 2008, revealed  multiple hemorrhages bilaterally in the brain, the largest in the right  basal ganglia measuring 7.1 x 2.5 cm with 6 mm of right to left midline  shift.  There was also an occluded segment versus high-grade stenosis in  the anterior left M2 branch.  There were displaced right middle cerebral  artery branches due to basal ganglia hemorrhage.  There was a fetal-type  right posterior cerebral artery.  Serial CT scans of the head from  February 21, 2008 through February 24, 2008, revealed evolving  intracranial hemorrhages in the left frontal and right basal ganglia  with slow resolution of the midline shift.   There were also left caudate  and thalamic infarcts noted.   MRI of the brain with and without contrast on February 26, 2008,  revealed multiple hemorrhagic and nonhemorrhagic infarcts as previously  described on CT scanning.  The largest area of hemorrhages in the right  basal ganglia measured 8 x 2.6 cm.  There was enhancement associated  with the left caudate head infarct, left temporal cortical infarct, and  left parietal cortical infarct.  There were no evidence for abscess or  infection.   Percutaneous gastrostomy placement performed on March 02, 2008, with the  successful placement of 20-French pull-through gastrostomy placement per  Dr. Oley Balm.   Chest x-ray performed on February 26, 2008, revealed patchy airspace  opacities in the left lower lobe consistent with pneumonia and chest x-  ray from March 06, 2008 revealed a right PICC in place with a tip at the  mid superior vena cava level.  There were no segmental infiltrates and  no pulmonary edema.  Gastrostomy tube was in place and heart size was  within normal limits.   CONSULTATIONS:  1. Cardiac consultation on February 16, 2008, Dr. [name], [facility]      Cardiology.  2. Neurology consultation on February 19, 2008, Dr. Porfirio Mylar Dohmeier.  3. Code Stroke consultation, Felicie Morn, PA-C, February 20, 2008.  4. Neurosurgical consultation, Dr. Trey Sailors on February 20, 2008.  5. Infectious Diseases consultation, Dr. Lina Sayre on February 20, 2008.  6. Occupational and physical therapy, and speech therapy consultations      - March 01, 2008.   HOSPITAL COURSE:  [name] is a very pleasant 55 year old male,  who presented to [facility] on February 16, 2008, complaining  of about a 1-week history of a viral-like illness with myalgias,  fatigue, and occasional chills.  Temperature maximum over the past week  was 100.4 degrees.  He reported actually feeling better on the day of  admission and  went to work and reportedly felt relatively well at work,  then returned home and had dinner and after dinner developed chest pain  that was substernal and very intense with a grade of about 7-8/10.  He  vomited once and the chest pain continued.  He also complained of some  right elbow pain as well.  He was brought to the [facility] Emergency  Room by his wife and was seen within 45-50 minutes of the onset of pain.  Denied any diarrhea or abdominal symptoms.  He had had a low-grade cough  during the previous week but nonproductive, and he responded to 2  sublingual nitroglycerins in the emergency room and was placed on  aspirin therapy and intravenous heparin. In the ER, his EKG revealed ST-  segment depression in the inferior leads including II, III and aVF.  He  was seen for immediate consultation by Dr. [name] of [facility]  [facility]iology, who agreed that this could possibly be an acute coronary  syndrome and he was admitted to 3300 for further therapy and  observation.   Cardiac enzymes were positive overnight, though chest pain had been well  controlled and vital signs were stable.  Troponin I was 1.4, CK was 171,  and a relative index was 9.3.  CK-MB was elevated at 15.9.  It was felt  that he likely had an acute coronary syndrome and nasal cannula oxygen,  intravenous nitroglycerin, beta-blocker therapy, and intravenous heparin  were continued.   The patient was taken to the cardiac cath lab by Dr. [name] on  February 17, 2008, where he was found to have a proximal LAD dissection  with a very small flap with excellent blood flow distally.  After  multiple consultations with his colleagues, Dr. [name] elected to treat  with Plavix, Integrilin, aspirin, and IV heparin and to observe over the  next several days for stability and healing.  On February 18, 2008, he  was comfortable and blood pressure was controlled.  Temperature maximum  was 100.0 degrees.  Pulse was 70 and  regular, blood pressure 105/50.  The patient was comfortable and felt to be improving.  Echocardiogram  performed on February 18, 2008 at 3:45 p.m. revealed an overall normal  left ventricular ejection fraction of 60% and there were no left  ventricular regional wall motion abnormalities.  Left ventricular  diastolic function parameters were normal.  Aortic valve thickness was  mildly increased and there was mild aortic valvular regurgitation.  There was also mild mitral valvular regurgitation.  There was a mobile  density in the left atrium, which was felt to possibly represent  artifact, but could not rule out any mass. A TEE or cardiac MRI was  recommended for further evaluation.   On the morning of February 19, 2008, [name] was without chest pain  and feeling quite well.  Vital signs were stable and he was afebrile.  Physical exam was within normal limits on chest and cardiac evaluation.  This was between 0700 and 0725 a.m.  He was also seen by Dr. [name]  shortly thereafter, then felt to be recovering well from his acute  coronary syndrome.  Cardiac MRI was ordered with gadolinium to further  assess the atrial abnormality noted on 2-D echocardiogram.  At  approximately 9:45 a.m., he was noted by his wife to call her by the  wrong name and we were notified immediately and a code stroke was  initiated.  [name] was having an obvious Wernicke aphasia with  inability to name objects or people consistently by their correct name.  He was taken immediately to the [facility] radiology department and  noncontrast CT of the head revealed no evidence  of intracranial  hemorrhage or abnormality.  The subsequent evaluations as dictated above  were performed.  He was seen in consultation by Neurology and it was  felt that he had almost certain thromboembolic phenomena to his brain  most likely from a cardiac source and he was administered t-PA.  Following administration of t-PA, his  speech improved and he was able to  name things more normally, and then he suddenly had a tonic seizure  lasting about 10 minutes.  He was intubated, sedated and intravenous  antiseizure medications were begun in the form of intravenous Dilantin.  Subsequent CT scan following t-PA administration that afternoon revealed  no intracerebral hemorrhage.  Blood cultures were obtained after a fever  of greater than 102 was recorded and he was started on vancomycin and  Rocephin and ID consultation was requested.   Subsequent 2-D echocardiogram performed on February 19, 2008 at 1:30  p.m. revealed mild aortic root dilatation and there was moderate  thickening of the mitral valve and findings were concerning for a mobile  mitral valve vegetation demonstrating chaotic motion.  The left atrial  posterior wall revealed an ill-defined frond-like mass still being  observed.  On the morning of February 20, 2008, CT angio of the head and  neck unfortunately revealed hemorrhagic CNS infarcts bilaterally with  the largest hemorrhage being 2.5 x 7.2 cm as mentioned in the above  radiologic procedures.  Also, blood cultures drawn on February 19, 2008  were reported out as positive for gram-positive cocci and subsequently  grew out Streptococcus gondii in all 3 cultures.  A diagnosis of  streptococcal endocarditis was made and Infectious Disease has  recommended a total of 45 days of intravenous anti-streptococcal  antibiotic therapy.   Neurologic sequelae have been severe with left hemiparesis but with some  function of the right upper extremity and right lower extremity.  Able  to shake hands, but not able to speak.   [name] developed recurrent fever and pneumonia in the left lower  lobe on February 26, 2008 and antibiotics were switched to Zosyn for  approximately 72 hours and then back to Rocephin with resolution of  chest x-ray findings by February 29, 2008.   Gastrostomy tube was placed on  March 02, 2008 for nutrtional purposes.   The patient's condition on transfer to Rehabilitation Medicine at [facility]  revealed quite severe neurologic deficits thus far with aphasia, with  probable cognitive deficits, and severe hemiparesis on the left.  Plans  are for further physical therapy, occupational therapy, and speech  therapy, and for progressing his PEG feedings to bolus feedings instead  of continuous.   He has had no further cardiac sequelae.   LABORATORIES:  Most recent laboratories are as follows:  BMET from March 06, 2008, revealed sodium 145, potassium 4.1, chloride 108, bicarb 29,  glucose 131, BUN 17, and creatinine 0.84.  CBC revealed a white count of  9000, hemoglobin 12.4, and platelet count 362,000.   The patient is on Keppra for seizure prophylaxis.   He has been made a No Code Blue in terms of cardiac and pulmonary  resuscitation with no chest compressions or defibrillation for cardiac  resuscitation and no mechanical intubation or ventilation for pulmonary  support.   [name] may be slowly improving neurologically with more periods of  alertness and interacts as best as he can with movements of his right  hand with gripping, hand shaking, rubbing, etc., to seemingly express  communication.  The hope is that overtime, he will develop much better neurological  function with resolution of inflammation, edema, and blood  intracranially.   Other issues are as follows:  1. Borderline mild elevation of blood pressure.  Continue to follow      and may need to add further medication for better blood pressure      control.  2. Bowel and bladder status.  We will likely not be able to care for      himself in this manner and will still need to be assessed to make      sure he does not have urinary retention after Foley catheter      removal.  3. Nutrition, continues on Jevity 1.2, with 2 scoops of Beneprotein,      at 70 mL an hour and focus is to transition to  bolus tube feedings.   Medications are as follows:  1. Crestor 10 mg per PEG daily.  2. Isordil 10 mg q.8 h. per PEG daily.  3. Seroquel 25 mg p.o. per PEG nightly.  4. Keppra 250 mg b.i.d. per PEG.  5. Metoprolol 50 mg b.i.d. per PEG.  6. Lovenox 40 mg subcu q.24 h.  7. Rocephin 2 g IV for total of 45 days of therapy, which would from     February 19, 2008 with supper was started on February 19, 2008.  8. Ambien 5-10 mg at bedtime p.r.n. insomnia.  9. Imodium 2-4 mg after each loose stool, not to exceed 8 mg daily.  10.Anusol/Tucks ointment as needed for hemorrhoids.  11.Tylenol 650 mg per 20.3 mL of solution per PEG q.4-6 h. p.r.n.      [name], M.D.  Electronically Signed     RNG/MEDQ  D:  03/08/2008  T:  03/08/2008  Job:  [id number]   cc:   [name], MD  [name], M.D.  Melvyn Novas, M.D.  [name], M.D.  Danae Orleans. Venetia Maxon, M.D.

## 2010-05-16 NOTE — H&P (Signed)
NAMEKYRIN, GRATZ               ACCOUNT NO.:  [id number]   MEDICAL RECORD NO.:  [id number]          PATIENT TYPE:  INP   LOCATION:  3305                         FACILITY:  [facility]   PHYSICIAN:  [name], M.D.DATE OF BIRTH:  09-10-1955   DATE OF ADMISSION:  02/16/2008  DATE OF DISCHARGE:                              HISTORY & PHYSICAL   CHIEF COMPLAINT:  Chest pain for 1 hour.   HISTORY OF PRESENT ILLNESS:  Mr. Holtsclaw is a very pleasant 55 year old  male with a history of borderline increase in blood pressure and  moderate elevation of his cholesterol, who presents with a history of 1  week of a viral-like illness with myalgias and fatigue and occasional  chills.  Temperature maximum over the past week was 100.4.  Tonight, he  developed chest pain after eating, that was substernal and very intense  and a grade of about 7-8/10.  The patient felt weak.  He then threw up  and the chest pain continued.  He also complained of some right elbow  pain as well.  He was brought to the [facility] Room by his  wife and was seen within 45-50 minutes of the onset of the pain.  He  denies any diarrhea or other abdominal symptoms.  He has had a low-grade  cough but nonproductive.  Denies any palpable chest pain.  His chest  pain ceased after 2 sublingual nitroglycerins in the emergency room.  He  is being admitted now to rule out acute coronary syndrome.   MEDICATIONS:  Aspirin today and Advil intermittently over the past week.  Crestor 5 mg daily.   ALLERGIES:  No known drug allergies.   PAST MEDICAL HISTORY:  1. Mitral valve prolapse.  2. Renal calculi.  3. BPH.  4. Moderate hypercholesterolemia.  5. Borderline elevations in blood pressure.   PAST SURGICAL HISTORY:  Transurethral extraction of a renal calculus in  2008.   FAMILY HISTORY:  Mother had possibly rheumatic heart disease and died at  age 80 from complications of GYN surgery.  Father is in his 108s and  apparently in good health.  He has a 92 year old brother, who is obese,  with asthma.   SOCIAL HISTORY:  He is married.  He is an Pensions consultant.  Wife is present  with him in the emergency room.  He exercises 4-5 times a week and  usually feels quite well while doing so.  Tends to feel fine usually  except for the above clinical syndrome over the past week.  He has felt  very fatigued over the past week.   REVIEW OF SYSTEMS:  Some diaphoresis and chills this week.  Very  fatigued.  Temperature maximum of 100.4.  Again, no diarrhea or  abdominal pain.  Denies any recent dyspepsia.   PHYSICAL EXAMINATION:  GENERAL:  Alert, oriented male in no acute  distress.  VITAL SIGNS:  Blood pressure 171/92, pulse 91 and regular, respirations  18 and nonlabored, temperature is 97.3.  HEENT:  Reveals him to be atraumatic, normocephalic.  NECK:  Supple without JVD or thyromegaly.  CHEST:  Clear to auscultation.  HEART:  Cardiac exam reveals a regular rhythm with no murmurs, or  gallops, rubs.  Did not hear a systolic click.  ABDOMEN:  Soft, nontender, nondistended.  Bowel sounds are normal.  No  obvious organomegaly.  EXTREMITIES: Without edema or cyanosis.  SKIN:  Felt somewhat clammy initially, but improved after nitroglycerin.  NEUROLOGICAL:  Oriented x3.  Nonfocal exam.   Chest x-ray revealed no active disease.  Normal cardiac size, but not a  very good inspiration.  EKG revealed sinus rhythm with controlled rate,  right bundle-branch block.  Question of ST-segment depression in the  inferior leads in II, III, and F.  Cardiac enzymes, CBC, D-dimers, BMET,  PT/PTT pending.   ASSESSMENT:  A 55 year old male with acute chest pain syndrome with  abnormal EKG, responsive to nitroglycerin, oxygen, and aspirin.  Here to  rule out acute coronary syndrome.  Maybe it could be pericarditis,  pulmonary embolus, esophageal spasm, or biliary colic in considering the  differential diagnosis.   PLAN:  We will  admit and treat with nasal cannula oxygen and check  serial cardiac enzymes.  Nitroglycerin p.r.n. and Lovenox.  We will also  monitor.  Dr. [name] is consulting from [facility].      [name], M.D.  Electronically Signed     RNG/MEDQ  D:  02/16/2008  T:  02/17/2008  Job:  [id number]   cc:   [name], MD

## 2010-05-16 NOTE — Assessment & Plan Note (Signed)
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  He has  continued to make progress since I last saw him.  I did place him on  Tegretol, this has helped some of his lability.  He is weaned off the  Keppra.  He is on Lexapro but is transitioning over to Pristiq through  Dr. Carie Caddy direction.  He feels in general he is improving.  He is  undergoing counseling.  He is hopeful that his wife may be coming around  a bit now.  He sees Dr. Leonides Cave at the end of month for  neuropsychological testing.  The patient has some mild pain at the left  shoulder with end range abduction, but this is minimal.  He is getting  better strength back.  He is in the gym working out, doing the  elliptical, occasionally some running by his account, etc.   REVIEW OF SYSTEMS:  Notable for occasional problems and appetite.  He  has some more recent left ankle pain due to his running.  Other  pertinent positives are above and full.  A 14-point review is in the  written health and history section of the chart.   SOCIAL HISTORY:  As noted above.  I believe his wife is still at the  home.   PHYSICAL EXAMINATION:  VITAL SIGNS:  Blood pressure is 157/121, recheck  116/68, pulse 52, and respiratory rate 18.  He is sating 100% on room  air.  NEUROLOGIC:  The patient is pleasant, alert, and oriented x3.  Affect is  bright.  He is a bit emotional at times but much improved from last  visit, left lability noted.  He has fair insight and awareness.  His  impulsivity was down.  He has a mild left central VII and tongue  deviation, but speech is clear.  Language skills are intact.  I had him  walk heel-to-toe and he lost balance slightly to the left.  He is able  to hop on left foot without significant balance loss.  He has minimal  tone on the left side.  He has some tightness in the pectoralis muscles  as well as the platysmas muscles with end range shoulder abduction  today.  His visual left pronator drift still in  standing.  Coordination  was fair to good overall in the upper extremities.  He still seems to  delay a bit with multi tasks.  Sensory exam is grossly intact on the  left side.  Visual fields are within normal limits.  HEART:  Regular.  CHEST:  Clear.  ABDOMEN:  Soft and nontender.   ASSESSMENT:  1. History of bicerebral infarcts with more left-sided symptomatology      in right.  2. Reactive depression and emotional dyscontrol syndrome.   PLAN:  1. Continue with Tegretol, but change to 200 mg dose which is      pill/swallow form.  He did not like the chewable tablets.  2. Lexapro to Pristiq per Dr. Carie Caddy direction.  3. Await Dr. Maxwell Marion neuropsychological testing.  Also, continue with      counseling.  4. Decide on driving capabilities when I review Dr. Maxwell Marion      information.  Might consider specific driving evaluation also.  He      still has some issues with his processing and potentially some      delay in reaction time.  He continues to make nice progress      overall, however.  5. Recommended range of  motion of left shoulder.  He can begin some      low weight machine lifting.  He is to stay      away from free weights at this point.  Continue aerobic exercise.  6. I will see him back in 2 months.  I would like to check Tegretol      level, CBC, liver function test.      Ranelle Oyster, M.D.  Electronically Signed     ZTS/MedQ  D:  08/25/2008 10:57:32  T:  08/26/2008 03:16:50  Job #:  [id number]

## 2010-05-16 NOTE — Procedures (Signed)
EEG NUMBER:  [id number].   HISTORY:  This is a 55 year old patient with myocardial infarction and  bacterial endocarditis.  The patient has sustained  bicerebral strokes  and has a right brain intracranial hemorrhage with seizures.  The  patient is being evaluated for status epilepticus this is a portable EEG  recording.  No skull defects noted.   MEDICATIONS:  Include Plavix, Rocephin, Cerbrex, Lopressor, Crestor  Protonix, Xanax, Versed, Ambien, Zofran, dopamine and fentanyl.   EEG CLASSIFICATION:  Delta grade 1, generalized.   DESCRIPTION OF RECORDING:  Background rhythm of this consists of a  moderately well-modulated 8-9 Hz background activity.  As the record  progresses, intermittent 1-2 Hz delta activity seen with bifrontal  predominance and surprisingly is bilaterally symmetric.  There are  rudimentary sleep spindles seen off and on throughout the recording in  an overlying beta frequency activity.  At no time, during the recording  it did appear to be evidence of spike, spike wave discharges and again  no focal slowing seen.  EKG monitor shows no evidence of cardiac rhythm  abnormalities with a heart rate of 60.   IMPRESSION:  This is an abnormal EEG recording due to generalized delta  slowing.  Such recording suggests diffuse neuronal dysfunction and could  be consistent with toxic or metabolic encephalopathy, but also could  indicate injury to the deep midline nuclei.  No epileptiform discharges  were seen.      Marlan Palau, M.D.  Electronically Signed     ZOX:WRUE  D:  02/20/2008 19:33:20  T:  02/21/2008 07:49:19  Job #:  [id number]

## 2010-05-16 NOTE — Cardiovascular Report (Signed)
NAMEDONOVAN, [name]               ACCOUNT NO.:  [id number]   MEDICAL RECORD NO.:  [id number]           PATIENT TYPE:   LOCATION:                                 FACILITY:   PHYSICIAN:  [name], MD      DATE OF BIRTH:  Aug 12, 1955   DATE OF PROCEDURE:  02/17/2008  DATE OF DISCHARGE:                            CARDIAC CATHETERIZATION   PRIMARY CARE PHYSICIAN:  [name], MD   INDICATIONS:  Non-ST elevation myocardial infarction - a 55 year old  male with hyperlipidemia, borderline hypertension who came in yesterday  with 7/10 substernal chest pain, followed by 1 bout of nausea and  vomiting with just like pressure in this area.  ECG was performed, which  demonstrated right bundle-branch block and diffuse ST depressions in  inferior and inferolateral leads.  Pain eventually resolved with IV  heparin, nitroglycerin, and beta-blocker.  Overnight, he did have an 18  beat run of ventricular tachycardia, nonsustained.   PROCEDURES:  1. Left heart catheterization.  2. Selective coronary angiography.  3. Left ventriculogram.   FINDINGS:  1. Left main artery:  Left main artery branches into LAD and      circumflex artery.  This artery is widely patent.  2. Left anterior descending artery:  This artery has 2 moderate-sized      diagonal branches.  In the proximal segment of the LAD, there is a      small dissection flap noted with an associated small filling defect      which does not demonstrate any evidence of dye staining. The      contrast in this area clears.  There is excellent flow around this      filling defect.  Otherwise, there is no other angiographically      significant coronary artery disease in the remainder of this      vessel. The LAD is a large vessel (4.5 - 5.0 mm).  3. Circumflex artery:  This artery gives rise to 1 large obtuse      marginal branch.  No angiographically significant coronary artery      disease.  4. Right coronary artery:  This is a  dominant vessel giving rise to      the posterior descending artery.  There is no angiographically      significant coronary artery disease.  5. Left ventriculogram:  There is a small basal anterior wall akinetic      and slightly aneurysmal segment with, otherwise, normal contractile      performance.  Ejection fraction is 55%.   HEMODYNAMICS:  Left ventricular systolic pressure is 133 with an end-  diastolic pressure of 21 mmHg.  Aortic pressure is 133/80, mean of 105  mmHg.  There is no gradient across the aortic valve.   IMPRESSION:  1. 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  Otherwise,      no angiographically significant coronary artery disease.  This      dissection has a recanalized and good flow, and there was a  TIMI-3      flow throughout the remainder of this vessel.  2. Basal anterior wall akinetic segment/aneurysmal segment with,      otherwise, normal contraction.  Ejection fraction of 55%.   PLAN:  Findings were discussed with Dr. Verdis Prime.  Both of [place] agree  to continue aggressive medical management with antiplatlet therepy. I  will initiate Plavix with a 300 mg load now, 75 thereafter and aspirin  325  mg.  We will also initiate Integrilin at least for the next 24 hours.  We will also restart heparin IV 6 hours post sheath pull.  Continue to  optimize medical management.  Continue statin drug, beta-blocker.  We  will monitor the patient closely in the step-down setting.  Findings  were also discussed with Mr. Bracken.      [name], MD  Electronically Signed     MCS/MEDQ  D:  02/17/2008  T:  02/17/2008  Job:  [id number]   cc:   [name], M.D.

## 2010-05-16 NOTE — Assessment & Plan Note (Signed)
[name] is here in regard to his bicerebral infarctions.  He was discharged  from [facility] around April 29, 2008, to a skilled nursing facility.  He has been home with his wife since June 09, 2008.  He has made  remarkable progress.  He ambulates independently without device.  He has  a caregiver at home, who helps with his meals and supervision, but  really does not depend on him anymore.  He goes to the gym now.  He has  interest in driving.  He is eating well.  He denies any pain today.  He  does report depression and emotional lability.  He and his wife are not  doing well in their relationship currently.  She is currently out in  [place] visiting friends.  He sleeps well.  He has come off most of the  medications he previously was on.  He is on Keppra 250 mg b.i.d. and  Lexapro 10 mg nightly.  He has been on Lexapro for about a month for his  mood.  He is on doxycycline now for inguinal rash and also takes Zocor  and Lopressor.   REVIEW OF SYSTEMS:  The patient reports the above.  Full 14-point review  is in the written health and history section of the chart.   SOCIAL HISTORY:  Noted above.  He does have history of drinking,  although he is not actively doing that.   PHYSICAL EXAMINATION:  VITAL SIGNS:  Blood pressure is 113/52, pulse is  50, respiratory rate is 16, and sating 93% on room air.  NEUROLOGIC:  The patient is pleasant, alert, and oriented x3.  Affect is  bright, although he is very emotional, goes from laughing to crying  frequently, but more often is tearful.  He has fair insight and  awareness overall, but is a bit impulsive in general.  He has a left  central VII, still a good pronunciation of words and no problems  swallowing on examination today.  MUSCULOSKELETAL:  His strength is 4+ to 5/5 on the right upper and  bilateral lower extremities.  Left upper extremity is 4/5 with some  blocking at the left elbow and shoulder which are a bit tight with 1/4  tone and  general tendon shortening.  He had a mild left pronator drift.  Decreased fine motor coordination in left upper extremity in general.  I  had him walk today.  He had no problems with normal gait, but did lose  balance on the left with heel-to-toe ambulation.  Sensory exam is  grossly intact on the left side.  HEART:  Regular rhythm, slightly bradycardic.  CHEST:  Clear.  ABDOMEN:  Soft and nontender.   ASSESSMENT:  1. History of bilateral cerebral infarcts most dramatically affecting      his left upper and lower extremity.  2. Reactive depression and emotional dyscontrol.   PLAN:  1. I think he could benefit from a trial of medicine such as Tegretol      to help stabilize his mood.  I would like him to continue following      up with Dr. Leonides Cave for a mood plan and treatment.  We will wean off      Keppra and begin the Tegretol which will end up at 200 mg b.i.d.      after the first week.  2. Continue Lexapro 10 mg nightly for now.  3. He and his wife both likely will need to be involved in counseling  to work through the changes that happened.  4. I do not feel comfortable allowing [place] to drive at this point as      he still has concerns over his emotional lability and impulsive      behavior.  Cognitively, he has made remarkable improvements and I      am shocked to even be thinking about letting him drive soon,      considering his recent medical course.  5. Continue with his physical therapy working on a higher level      balance and coordination, left shoulder range of motion.  I do not      think he requires Botox or any oral antispasmodics at this point.      He is making great progress      there as well.  6. I will see him back in about 4-6 weeks' time.  I am happy to answer      any questions in the meantime.      Ranelle Oyster, M.D.  Electronically Signed     ZTS/MedQ  D:  07/20/2008 13:31:41  T:  07/21/2008 02:59:49  Job #:  [id number]   cc:   [name], M.D.  Fax: [phone]

## 2010-05-16 NOTE — Discharge Summary (Signed)
[name], [name]               ACCOUNT NO.:  [id number]   MEDICAL RECORD NO.:  [id number]          PATIENT TYPE:  IPS   LOCATION:  4005                         FACILITY:  [facility]   PHYSICIAN:  Ranelle Oyster, M.D.DATE OF BIRTH:  12-08-1955   DATE OF ADMISSION:  03/08/2008  DATE OF DISCHARGE:                               DISCHARGE SUMMARY   DISCHARGE DIAGNOSES:  1. Bi-cerebral infarction, ICH - encephalopathy.  2. Endocarditis.  3. Dysphagia.  4. Non-ST elevation myocardial infarction.  5. Hyperlipidemia.  6. Seizure disorder.  7. Hypertension.  8. Deep vein thrombosis prophylaxis with subcutaneous Lovenox.   PROCEDURES:  Placement of gastrostomy tube March 02, 2008 for nutritional  support secondary to dysphagia.   This is a 55 year old white male with mitral valve prolapse and  hypertension who was admitted February 15 with recent viral illness and  fatigue.  Developed chest pain after [name] evening meal.  Received  nitroglycerin x2 in the emergency department.  Chest x-ray negative.  EKG with right bundle branch block and diffuse ST depression.  Cardiac  panel with troponin 37.82, CK-MB of 107.  Placed on intravenous heparin.  Cardiac catheterization Dr. [name] with findings of spontaneous proximal  left anterior descending dissection with filling defect.  No significant  coronary artery disease noted.  Placed on aspirin and Plavix therapy.  On February 18, with aphagia and left-sided weakness, MRI showed left  temporal lobe infarction.  Neurology consulted and tPA initiated for  presumed embolic ischemic stroke.  [name] received initial bolus and  developed a seizure, loaded with Ativan and intubated at that time.  Cranial CT scan follow-up with hemorrhagic conversion, tPA and been  completed.  Follow-up cranial CT scan and CT angiogram February 19 with  3 hemorrhages, the right basal ganglia, left frontal lobe and left  posterior fossa.  Neurosurgery, Dr. [name], advised  conservative care and  monitored.  EEG February 19 with no seizure activity noted.  There was  diffuse neuron dysfunction consistent with toxic and metabolic  encephalopathy.  Remained on Keppra for seizure disorder.   HOSPITAL COURSE:  Positive blood cultures of gram-positive cocci  endocarditis.  Infectious disease consulted, Zosyn was discontinued and  placed on intravenous Rocephin March 2 x4 weeks.  A gastrostomy tube was  placed on March 2 per interventional radiology for dysphagia and  nutritional support.  Subcutaneous Lovenox was later initiated March 5  for deep vein thrombosis prophylaxis.  [name] was seen by inpatient rehab  services and felt to be a good candidate for comprehensive rehab  program.   PAST MEDICAL HISTORY:  1. Mitral valve prolapse.  2. Borderline hypertension.  3. Benign prostatic hypertrophy.  4. Kidney stones.  5. Hyperlipidemia.   No tobacco.  Occasional alcohol.   ALLERGIES:  None.   SOCIAL HISTORY:  [name] is married.  [name] is a [employer] fast [employer].  Multiple  family in the area.   FUNCTIONAL HISTORY:  Prior to admission was independent.   FUNCTIONAL STATUS:  Upon admission to rehab services was +2 total assist  bed mobility, +2 total assist transfers, gait was not tested.  [name] was  limited to follow commands.   MEDICATIONS PRIOR TO ADMISSION:  Crestor 5 mg daily and aspirin daily.   PHYSICAL EXAMINATION:  Blood pressure was 135/88, pulse 95, temperature  97.9, respirations 20, oxygen saturation 95% on room air.  This was a lethargic male, [name] was difficult to keep awake.  Limited to  follow exam.  Would not verbalize or follow any specific commands,  either verbal or motor commands.  Made limited eye contact with the  examiner.  Grimaces to deep palpation.  A Foley catheter tube was in  place.  Deep tendon reflexes were hypoactive.  LUNGS:  Clear to auscultation.  CARDIAC:  Regular rate and rhythm.  ABDOMEN: Soft, nontender.  Good bowel sounds with  G-tube in place.   REHABILITATION HOSPITAL COURSE:  The patient was admitted to inpatient  rehab services with therapies initiated on a 3-hour daily basis  consisting of physical therapy, occupational therapy, speech therapy and  rehabilitation nursing.  The following issues were addressed during the  patient's rehabilitation stay.   Pertaining to [name] bi-cerebral hemorrhagic infarctions,  remained stable.  Conservative care provided.  [name] had seen Dr. Trey Sailors  of neurosurgery during [name] hospital course who advised conservative  care.  [name] was now on subcutaneous Lovenox for deep vein thrombosis  prophylaxis which had been initiated on March 05, 2008.  [name] completed a  course of intravenous Rocephin for endocarditis, follow-up per  cardiology services who continued to follow the patient closely.  [name]  blood pressures was well-controlled as [name] continued on Isordil as well  as Lopressor 50 mg twice daily, diastolic pressures 60-68, pulse from 61-  72.  [name] was followed intensely by speech therapy for both [name] stroke and  findings of toxic metabolic encephalopathy identified per EEG.  Parlodel  had been initiated to help the patient attain better attention as [name] was  still somewhat limited in [name] awareness and initial restraints were  being used for [name] safety.  These were later discontinued.  [name] was using  a Low Boy bed for [name] safety as well as a bed monitor with frequent  checks.  [name] was initially n.p.o. with a gastrostomy tube placed March 2,  per interventional radiology.  [name] diet was steadily advanced to a  regular consistency and [name] G tube was removed on April 29, 2008,  without incident.  [name] continued on Crestor for hyperlipidemia.  Noted  seizure disorder as Keppra was ongoing at 500 mg twice daily with no  more seizure activity noted.  [name] continued to have increased tone which  did improve with the use of baclofen, which was titrated 20 mg every 6  hours.  [name] also  received Botox with good results.   Weekly collaborative interdisciplinary team conferences were held to  discuss the patient's estimated length of stay, any barriers to  discharge and ongoing family teaching.  [name] was essentially minimal  assist to close supervision for [name] transfers and mobility, ambulating  minimum to moderate assist, with decreased balance and needing ongoing  cues for [name] safety, moderate to max assist for verbal output/underlying  language impairment. It was noted the patient was initially nonverbal  upon [name] rehab admission and [name] was able to communicate now [name] basic  needs.  [name] was moderate to max assist improving for [name] functional  problem solving.  [name] was still easily distracted, impulsive, delayed  initiation with functional directions.  [name] was minimal assist for upper  body  bathing and dressing, moderate to max assist lower body bathing and  dressing,  minimal assist toileting and shower transfers.  [name] Foley  catheter tube had been removed.  [name] received routine toileting by rehab  nursing.  [name] was able to sit at the edge of the bed with supervision to  don [name] pants, shoes and socks.  [name] was appropriate, clear conversation  for basic communication.  It was felt, with an ongoing long hospital  course and wife being able to provide but limited care at home, that a  skilled nursing facility would be needed with a bed becoming available  on April 30, 2008 and discharge taking place at that time.   Latest labs showed a sodium 139, potassium 3.7, BUN 9, creatinine 0.7,  hemoglobin 38, hematocrit 40.1, platelet 258,000, WBC of 7.6.  The  patient was discharged in stable condition to skilled nursing facility.   DISCHARGE MEDICATIONS:  At time of dictation included:  1. Crestor 10 mg p.o. q.h.s.  2. Isordil 20 mg p.o. t.i.d.  3. Parlodel 10 mg p.o. at 7 a.m. and 12 noon daily.  4. Baclofen 20 mg p.o. every 6 hours.  5. Colace 200 mg p.o. daily, hold for loose  stools.  6. Keppra 500 mg p.o. b.i.d.  7. Lopressor 50 mg p.o. b.i.d.  8. Tylenol 650 mg p.o. every 4 hours as needed pain.  9. Ultram 50 mg p.o. every 8 hours as needed severe pain.   DIET:  Regular consistency with thin liquids.   SPECIAL INSTRUCTIONS:  Continue therapies to promote overall mobility  and well-being.  Follow-up with Dr. Faith Rogue, outpatient rehab  service, office appointment be made [phone]; Dr. [name], cardiology  services, and Dr. [name], medical management.  Please apply dry  dressing daily to G-tube site until healed.      Mariam Dollar, P.A.      Ranelle Oyster, M.D.  Electronically Signed    DA/MEDQ  D:  04/29/2008  T:  04/29/2008  Job:  [id number]   cc:   [name], M.D.  [name], MD  Melvyn Novas, M.D.  Cliffton Asters, M.D.

## 2010-05-16 NOTE — Consult Note (Signed)
[name], [name]               ACCOUNT NO.:  [id number]   MEDICAL RECORD NO.:  [id number]          PATIENT TYPE:  INP   LOCATION:  2002                         FACILITY:  [facility]   PHYSICIAN:  Melvyn Novas, M.D.  DATE OF BIRTH:  May 31, 1955   DATE OF CONSULTATION:  02/19/2008  DATE OF DISCHARGE:                                 CONSULTATION   ADDENDUM Part 2 of consult- Summaryof events.   NEUROLOGIC:  The patient has no visible cranial nerve deficits.  He  shows initially good coordination with a right-sided dysmetria, that was  rather subtle, and he has a positive right-sided drift for the upper  extremity pronator drift.  Gait had to be deferred.  The patient was  asked to elevate antigravity leg or arm on the right side, he was not  able to maintain this for greater than 5 seconds.  Grip strength on the  right was slightly weaker, but deep tendon reflexes remain symmetric  with down going toes bilaterally.  He stated that he could sense temperature or vibration and pinprick  bilaterally.  There was no evidence of any extinction when we initially  saw the patient.  Again, after intubation and sedation, the patient was  no longer able to cooperate of course with a detailed exam and he  remained still sedated for the first night after his seizure.   LABORATORY DATA:  On the day of the neurologic consultation showed a  troponin of 7.58, CK-MB 2.5, CPK at 138.  Sodium 139, potassium 3.6,  chloride 104.  White blood cell count 10.7, platelets 351, hemoglobin  and hematocrit 12 and 34.2.  The patient showed a 2-D echo with ejection  fraction of 55%.  He is status post cardiac catheterization and IV  heparin treatment as I understand without bolus.  This was discontinued  more than 24 hours before the sensory aphasia became evident and it  should not pose a hypocoagulability risk for this patient.   Due to the history of febrile temperatures and Dr. [name] is feeling  that the  bedside 2-D echo that he repeated on February 19, 2008, after  the patient was admitted to 3100 , this was still not looking quite  normal within the left atrium.  We discussed alternative reasons for the  patient's apparently embolic strokes.  One of them of course became the  possibility of endocarditis with septic emboli.  Dr. [name] had called  and Dr. Maurice March from the ID team earlier the same day and has requested the  patient to be placed on vancomycin.  Dr. [name] was interested in heart MRI once obtainable and Neurology  arranged one EEG next Wednesday and a CT scan for the next morning.   Throughout the day, the patient was seen multiple times by various  specialists and his wife was updated with the development.  The very  next day after the consultation, the patient remained with a right  facial droop, but he now also developed a sudden left-sided weakness.  The CT scan on the morning of February 20, 2008, was then showing 3  hemorrhages in the right basal ganglial area, left frontal lobe, and  left posterior fossa.  The patient remained on IV anticonvulsants, Versed, and fentanyl.  Dr. [name] was called to evaluate the patient from the neurosurgical team.  Blood cultures had not returned at this time though the theory these  might be septic emboli could not be confirmed yet and the patient had  been loaded with IV antibiotics.  The stroke team with Dr. [name]  continued to follow from February 20, 2008.   The patient remained on Dr. [name], Internal Medicine Service and  continued to be followed by Dr. [name] for Cardiology.      Melvyn Novas, M.D.  Electronically Signed     CD/MEDQ  D:  03/04/2008  T:  03/05/2008  Job:  [id number]   cc:   [name], MD  [name], M.D.  [name], MD  [name], M.D.  [name], M.D.

## 2010-05-16 NOTE — Consult Note (Signed)
[name], [name]               ACCOUNT NO.:  [id number]   MEDICAL RECORD NO.:  [id number]          PATIENT TYPE:  INP   LOCATION:  3113                         FACILITY:  [facility]   PHYSICIAN:  [name], M.D.      DATE OF BIRTH:  1955-01-18   DATE OF CONSULTATION:  02/20/2008  DATE OF DISCHARGE:                                 CONSULTATION   REQUESTING PHYSICIAN:  [name], MD   BODY OF TEXT:  This is a 55 year old gentleman with a fairly complicated  history.  On February 16, 2008, he experienced substernal chest pain was  brought into the hospital.  He had a history of mitral regurgitation  aortic insufficiency and was felt to have acute MI syndrome, was seen  the cardiologist who suggested a cardiac catheterization based on the  history.  He underwent cardiac cath.  There was a question of a  dissection.  He underwent antiplatelet therapy.  Following the cardiac  cath he began experiencing some dysphagia and some fascial weakness.  This was on February 19, 2008, he had the onset of aphasia.  He  underwent a CT, which was read as negative.  He continued to have a new  right fascial droop, slurring of his speech.  He was visited by the  Stroke Service who felt he was having stroke and was started on TPA.  After the TPA was started, he on February 19, 2008, in the evening began  developing some seizure activity.  He has previously undergone an MR  that demonstrated some diffuse strokes and felt these could be embolic.  Because of the seizure, the TPA was stopped.  He then underwent this  morning a CT scan, which shows 3 hemorrhages in his right basal ganglia,  left frontal lobe, and left posterior fossa.  Currently, he is on IV  anticonvulsants, Versed, and fentanyl drip and is somewhat sedated.   Currently there is no interventricular extension of the blood and the  hemorrhages appear to be stable, have a little edema around them   Currently, I do not think there is any  need for neurosurgical  intervention, which strongly recommend re-CT in the morning.  If the  hemorrhage extended in the ventricle, he may require ventriculostomy.  Apparently, he has also had echo, which demonstrates some vegetation on  his heart valve.  It is unclear whether these represent septic emboli or  not, however, with the long history of mitral valve prolapse and a  positive blood culture it seems that it has a strong possibility.  He is  currently on appropriate antibiotics for this.  I will continue to  observe and followup on a CT scan.           ______________________________  [name], M.D.     MWR/MEDQ  D:  02/20/2008  T:  02/21/2008  Job:  [id number]

## 2010-05-16 NOTE — Consult Note (Signed)
NAMEMATTHAN, [name]               ACCOUNT NO.:  [id number]   MEDICAL RECORD NO.:  [id number]           PATIENT TYPE:   LOCATION:                                 FACILITY:   PHYSICIAN:  Melvyn Novas, M.D.  DATE OF BIRTH:  [birth year]   DATE OF CONSULTATION:  02/19/2008  DATE OF DISCHARGE:                                 CONSULTATION   TIME:  9:45   CHIEF COMPLAINT:  Sudden onset of expressive aphasia.  Code stroke.   HISTORY OF PRESENT ILLNESS:     ______________________________  Felicie Morn, PA-C      Melvyn Novas, M.D.  Electronically Signed    DS/MEDQ  D:  02/19/2008  T:  02/20/2008  Job:  [id number]

## 2010-05-16 NOTE — Consult Note (Signed)
[name], [name]               ACCOUNT NO.:  [id number]   MEDICAL RECORD NO.:  [id number]          PATIENT TYPE:  INP   LOCATION:  3305                         FACILITY:  [facility]   PHYSICIAN:  [name], MD      DATE OF BIRTH:  [birth year]   DATE OF CONSULTATION:  02/16/2008  DATE OF DISCHARGE:                                 CONSULTATION   REFERRING PHYSICIAN:  [name], MD   REASON FOR CONSULTATION:  Mr. [name] is being seen at the request of  Dr. [name] for the evaluation of chest pain.   HISTORY OF PRESENT ILLNESS:  A 55 year old male with familial  hyperlipidemia, prior mitral valve prolapse, borderline hypertension,  prior nephrolithiasis who is being admitted with chest pain.  This  episode occurred tonight at 8:30 p.m. following dinner which consisted  of spaghetti.  Over the past several days, he has been experiencing a  febrile illness with occasional shaking chills, myalgias, body aches and  fever.  He missed work the last 3 days of last week.  Tonight after  dinner, he developed fairly sudden onset substernal chest discomfort  described as a pressure 7/10 in intensity without any radiation.  He has  never experienced a pain like this prior.  He has experienced fleeting  sharp chest pain with his mitral valve prolapse in the past.  He stated  that he felt mild shortness of breath accompanying this discomfort.  No  significant diaphoresis.  At 8:45 while trying to go to the car, he  vomited x1 in the driveway.  No hematemesis, no coffee-ground emesis.  No other vomiting episodes.  Two days prior to this chest discomfort, he  had been experiencing a dry hacking cough.  Once here in the emergency  department, he received aspirin, nitroglycerin x2 which did help relieve  his chest discomfort slightly.  He also obtained an EKG which  demonstrated sinus rhythm with right bundle-branch block morphology and  diffuse ST depression in II, III, aVF, V4, V5,  V6.  There was no  significant ST elevation noted.  Possibility of ischemia is noted.  I do  not have a prior ECG for comparison.   His last echocardiogram in December 21, 2005 showed an ejection fraction  of 60-65% with mitral valve prolapse and mild mitral regurgitation and  mild aortic insufficiency.  He also states that he had a treadmill test  in December 2007 which was within normal limits with appropriate blood  pressure response.  He exercises regularly about five times a week and  has not experienced any chest discomfort during this activity.   PAST MEDICAL HISTORY:  1. Hyperlipidemia - LDL has been as high as 180, now taking Crestor 5      mg.  He had a LIPITOR and PRAVASTATIN intolerance.  2. Borderline hypertension - on no current medications.  3. Low back pain.  4. Mitral valve prolapse.  5. Nephrolithiasis.   PAST SURGICAL HISTORY:  Basket extraction with nephrolithiasis by Dr.  Logan Bores.   ALLERGIES:  No known drug allergies.   MEDICATIONS:  At home, he is taking 5 mg of Crestor once a day.  He is  not taking any other medications.  He is not on an aspirin or any other  antihypertensives.   SOCIAL HISTORY:  Denies any tobacco use.  No illicit drug use.  In the  past few days, he has not had any alcoholic beverages.  He usually  drinks approximately one beer or one glass of wine per night.  He is a  Curator.   FAMILY HISTORY:  His mother died of rheumatic fever complications at age  66 after a dilation and curettage, but there is no early family history  of coronary artery disease present.   REVIEW OF SYSTEMS:  He has been experiencing some right knee pain but  denies any rash.  Denies any syncope, palpitations, or bleeding  disorders as per HPI.  Unless satisfied above, all other 12 review of  systems negative.   PHYSICAL EXAMINATION:  VITAL SIGNS:  Blood pressure 153/75, pulse 92,  sating 100% on 2 L, afebrile, respiration rate 18.  GENERAL:   Alert and oriented x3 in no acute distress although he is  slightly anxious with his wife who is at bedside.  EYES:  Well perfused conjunctivae.  EOMI.  No scleral icterus noted.  NECK:  Supple.  No lymphadenopathy.  No thyromegaly.  No JVD.  No  carotid bruits.  CARDIOVASCULAR:  Regular rate and rhythm.  No murmurs, rubs or gallops  noted.  At times, he was slightly tachycardic.  Normal PMI.  LUNGS:  Clear to auscultation bilaterally without any wheezes or rales.  ABDOMEN:  Soft, nontender, normoactive bowel sounds.  He did not have  any epigastric tenderness noted.  No hepatosplenomegaly.  EXTREMITIES:  No clubbing, cyanosis or edema.  Normal distal pulses 2+  lower extremity.  NEUROLOGIC - nonfocal.  No tremors.  SKIN:  Warm, dry and intact.  He is not diaphoretic.  No rashes.  PSYCH:  Normal affect.   Labs currently pending.  Chest x-ray shows no acute airspace disease.  EKG as described above shows sinus rhythm with diffuse ST depressions,  possible ischemia with right bundle-branch block morphology.  Heart rate  is 94.  Prior medical records reviewed.  Chest x-ray was personally  reviewed.   ASSESSMENT AND PLAN:  A 55 year old male with mitral valve prolapse,  mild hypertension, hyperlipidemia with a recent febrile illness and new-  onset chest pain concerning for possible acute coronary syndrome.  1. Possible acute coronary syndrome - given his ECG with diffuse ST      depressions, I will go ahead and treat him for possible acute      coronary syndrome.  Intravenous heparin, beta blocker, aspirin,      oxygen, nitroglycerin as needed.  Repeat ECG in the a.m.  Cardiac      enzymes x3 x q.8 hours.  Other possible etiologies for his chest      discomfort include viral gastritis.  D-dimer currently pending.      Appears low risk for pulmonary embolism.  He is quite an active man      without any chest discomfort during normal daily activity.  2. Hyperlipidemia - continue Crestor 5  mg.  We will check a fasting      lipid profile in the a.m.  3. Abnormal EKG - right bundle-branch block with ST depressions as      noted above.  4. Febrile illness - we will go ahead and  hydrate with 100 mL of      normal saline overnight.  I will also make him n.p.o. in case there      is any need for further testing tomorrow morning.   If cardiac biomarkers are reassuring and if his chest pain resolves, he  will likely receive a nuclear stress test for further risk  stratification.  If any cardiac biomarkers are abnormal or if his ECG  does not resolve or chest pain does not resolve, he will be a possible  cardiac catheterization candidate.  I have discussed that with he and  his wife.      [name], MD  Electronically Signed     MCS/MEDQ  D:  02/16/2008  T:  02/17/2008  Job:  [id number]   cc:   [name], M.D.

## 2010-05-16 NOTE — Consult Note (Signed)
[name], [name]               ACCOUNT NO.:  [id number]   MEDICAL RECORD NO.:  [id number]          PATIENT TYPE:  INP   LOCATION:  2002                         FACILITY:  [facility]   PHYSICIAN:  Melvyn Novas, M.D.  DATE OF BIRTH:  1955-03-25   DATE OF CONSULTATION:  02/19/2008  DATE OF DISCHARGE:                                 CONSULTATION     Part one -  This consultation came about as a code stroke call for an in-hospital  patient, who is admitted to the care of Dr. [name] with Dr.  Patsi Sears, [facility].   The patient is a 55 year old male who presented with a chief complaint  of chest discomfort on February 16, 2008, in the evening hours to the  [facility] ER.  The patient had just overcome a viral illness, but had  remained out of work with malaise, subfebrile temperatures.  The day of  his presentation, he developed right after dinner a chest pressure/  chest pain, nausea, and vomiting.  He was coughing, but showed no phlegm  and  remained nonproductive.  He felt better after taking Advil.  He  initially provided no other complaints and when Dr. [name] admitted him  to the Internal Medicine Service, a Cardiology consult was called.  Because of some EKG changes and persistent chest discomfort that the  patient did not address his pain and that seemed to respond to NSAIDs.  The patient has undergone repeated cardiac enzyme drawing during his  first 2 days of admission and a troponin level was slightly elevated,  but had started to decline on followup test.  His cardiologist had  performed a 2-D echo and was concerned about the left atrium presenting  with some unusual intra-atrial thickening or filling defect that looked  atypical for a myxoma.  The patient had stated that he had a history of  mitral wall prolapse and took baby aspirin for this.  He seemed to  improve and temperature elevations were not seen on February 19, 2008,  when the Neurology  consultation came about.  The patient had just been  served breakfast and had talked to his wife and to a Med Tech that  served the tray.  When 15 minutes later his wife came back into the  patient's room, he was talking gibberish and seemed to replace words.  Yet, he seems not to be quite aware that he did this.    He did not slur speech, strictly speaking, and he seemed to comprehend  well.  All this happened around 8:30 to 8:45 in the morning.  A code  stroke was called for the expressive aphasia, and the patient received a  head CT scan, which showed no mass or bleed.  Dr. [name], a cardiologist then clinically reevaluated the patient from  the cardiac perspective.  He would have liked a cardiac MRI at this  time, but I felt that if this patient suffered a possible stroke, an MRI  would give [place] more information and would also help [place] to give if  necessary t-PA within the therapeutic  time window.  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  There were also very  small distal less than 3 or 4 mm found brightly enhanced in the right  temporal region and right occipital region and left parietal temporal.  A t-PA was then administered for a presumed embolic ischemic stroke.  The patient received that the initial bolus and within 2-3 minutes after  the infusion of t-PA followed, developed a seizure.  This time, he began  using with his left arm, wrist flexion, elbow flexion, and then  extension and an almost posturing fashion of the upper extremity  followed.  Until the seizure occurred, the patient had presented with  right motor weakness and expressive aphasia.  Now, we interrupted t-PA  infusion and decided to load the patient with Ativan to which he did not  respond.  He then received 1 g of Depakene IV and 1 g of fosphenytoin IV.  He  needed to be intubated to protect his airway.  He was  in status and had   already been transferred to 3100, the Neurological ICU at this time, now  requiring intubation as a result to seizures and his inability to  protect his airway.  The patient's wife was continuously updated about  the neurologic changes and findings.  I was in constant contact with Dr.  [name] by cell phone and with Dr. [name], Cardiology.  We  discussed to complete or not completes the t-PA infusion, and a CT scan  of the brain unenhanced was obtained, which showed no hemorrhagic  conversion.  Upon receiving these informations, the t-PA infusion was  continued and completed within the allowed window.   PAST MEDICAL HISTORY:  1. Hypertension.  2. Hypercholesterolemia.  3. Renal calculi.  4. Benign prostatic hypertrophy.  5. Mitral valve prolapse.   At home the patient was on a daily aspirin, recently took frequent Advil  because of substernal chest pain and malaise.  In the hospital, he has  been placed on Imdur, Lopressor, Protonix, Plavix, Lovenox, and Crestor.  He was then in the Intensive Care Unit started on anticonvulsant  infusions on Versed and fentanyl per IV path.   ALLERGIES:  He is not allergic to any medication, does have a  significant intolerance to STATINS and developed myalgia.   SOCIAL HISTORY:  Does not smoke, drinks 1 alcoholic beverage per night,  is married, has teenage children, works as an Pensions consultant.   REVIEW OF SYSTEMS:  Positive for nausea, vomiting, and the described  symptoms as elicited in the history of present illness.   PHYSICAL EXAMINATION:  The patient to have clear lungs to auscultation.  No bruits and no temporal artery induration.  No neck rigidity.  No  abdominal tenderness.  His body temperature was 98.7, his blood pressure  was reduced after the sedation, protocol was initiated to 95/45 mm Hg ,  and we decided to reduce the Versed to allow blood presure to recover,  and give him a fluid bolus to bring the systolic blood pressure  into the  120-130 range. His pulse rate was no longer tachycardic.  Once the patient's seizure had stopped, he showed a regular respiratory  rate.  The patient was unarousable after the sedation and intubation as  intended in comparison to the mental status prior to the development of  the seizure.  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  He had full  extraocular movements, showed no gaze or vision impairment, symmetric  facial features were preserved.  Tongue and uvula were midline.  There  was no clear sensory abnormalities.      Melvyn Novas, M.D.  Electronically Signed     CD/MEDQ  D:  03/04/2008  T:  03/05/2008  Job:  [id number]

## 2010-05-19 NOTE — Op Note (Signed)
[name], [name]               ACCOUNT NO.:  [id number]   MEDICAL RECORD NO.:  [id number]          PATIENT TYPE:  AMB   LOCATION:  [facility]                         FACILITY:  [facility]   PHYSICIAN:  Jamison Neighbor, M.D.  DATE OF BIRTH:  1955-03-25   DATE OF PROCEDURE:  03/04/2006  DATE OF DISCHARGE:                               OPERATIVE REPORT   PREOPERATIVE DIAGNOSIS:  Distal left ureteral calculus.   POSTOPERATIVE DIAGNOSIS:  Distal left ureteral calculus.   PROCEDURE:  Cystoscopy, left retrograde, left ureteroscopy, and left  basket extraction.   SURGEON:  Dr. Logan Bores   ANESTHESIA:  General.   COMPLICATIONS:  None.   DRAINS:  None.   BRIEF HISTORY:  This 55 year old male has a stone that is impacted in  the distal left ureter, has been unable to pass despite the use of  Flomax.  The patient's pain has become difficult to control, and he has  elected to have the stone removed.  He understands the risks and  benefits of the procedure and gave full informed consent.   PROCEDURE:  After the successful induction of general anesthesia, the  patient was placed in the dorsal lithotomy position, prepped with  Betadine and draped in the usual sterile fashion.  Cystoscopy was  performed, and the bladder was carefully inspected.  It was free of any  tumor or stones.  Both ureteral orifices are normal in configuration and  location, although left side appeared to be somewhat edematous in its  appearance.  A left retrograde was then performed.  The 6-French  ureteral catheter was inserted without difficulty.   Left retrograde revealed a narrowed distal ureter and then a dilated  proximal ureter.  No filling defects could be seen along the course of  the ureter, and the collecting system was unremarkable.  Contrast did  not flow beyond the distal ureter which appeared to be obstructed.   Following completion of the retrograde, a guidewire was passed up to the  kidney, and the  cystoscope was removed.  The ureteroscope was advanced  along the guidewire and then passed into the distal ureter.  A stone  could be seen in the distal ureter, but it was quite impacted and  partially covered by mucosa.  A basket was placed and was used to pull  the stone out from underneath the mucosal flap, and then it was then  extracted under direct vision.  The ureteroscope was reinserted, and the  ureter was visualized, and there was no significant injury.  The small  amount of mucosal erosion where the stone had been impacted should heal  without difficulty and was felt that a stent was not  necessary.  The bladder was drained.  The patient tolerated procedure  well and was taken to the recovery room in good condition.  We will  continue with the Flomax as needed as well as his pain medicine and will  return to see me in followup in about 2 weeks.           ______________________________  Jamison Neighbor, M.D.  Electronically Signed  RJE/MEDQ  D:  03/04/2006  T:  03/04/2006  Job:  [id number]

## 2010-09-06 ENCOUNTER — Encounter: Payer: Self-pay | Admitting: Internal Medicine

## 2010-10-11 ENCOUNTER — Encounter: Payer: Self-pay | Admitting: Internal Medicine

## 2010-10-24 ENCOUNTER — Encounter: Payer: Self-pay | Admitting: Internal Medicine

## 2010-10-26 ENCOUNTER — Encounter: Payer: Self-pay | Admitting: Internal Medicine

## 2010-10-26 ENCOUNTER — Ambulatory Visit (INDEPENDENT_AMBULATORY_CARE_PROVIDER_SITE_OTHER): Payer: PRIVATE HEALTH INSURANCE | Admitting: Internal Medicine

## 2010-10-26 VITALS — BP 115/68 | HR 60 | Ht 68.0 in | Wt 204.0 lb

## 2010-10-26 DIAGNOSIS — I059 Rheumatic mitral valve disease, unspecified: Secondary | ICD-10-CM

## 2010-10-26 DIAGNOSIS — I2109 ST elevation (STEMI) myocardial infarction involving other coronary artery of anterior wall: Secondary | ICD-10-CM

## 2010-10-26 DIAGNOSIS — E785 Hyperlipidemia, unspecified: Secondary | ICD-10-CM

## 2010-10-26 NOTE — Progress Notes (Signed)
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  He also has a history of dyslipidemia  I saw him back in January. Since seen he denies CP . No significant SOB.  Notes some lethergy.  Felt better on Adderral but has stopped due to mood. No palpitations.   Exercising regularly.  Admits to eatting more than he should  Weight is up. No Known Allergies  Current Outpatient Prescriptions  Medication Sig Dispense Refill  . ARIPiprazole (ABILIFY) 10 MG tablet Take 10 mg by mouth daily.        . Armodafinil (NUVIGIL) 150 MG tablet Take 150 mg by mouth daily.        . cholecalciferol (VITAMIN D) 1000 UNITS tablet Take 1,000 Units by mouth daily. 2 tabs daily       . desvenlafaxine (PRISTIQ) 100 MG 24 hr tablet Take 100 mg by mouth daily.        . metoprolol tartrate (LOPRESSOR) 25 MG tablet 1/2 TAB BID       . simvastatin (ZOCOR) 40 MG tablet Take 40 mg by mouth at bedtime.          Past Medical History  Diagnosis Date  . Endocarditis     MV Vegitation  . Myocardial infarction   . Mitral regurgitation   . CVA (cerebral infarction)     hemmorhagic  CVA  . Dyslipidemia     No past surgical history on file.  Family History  Problem Relation Age of Onset  . Heart disease Mother     mom RF as a child (died of heart problem in  78 )    History   Social History  . Marital Status: Legally Separated    Spouse Name: N/A    Number of Children: N/A  . Years of Education: N/A   Occupational History  . Not on file.   Social History Main Topics  . Smoking status: Never Smoker   . Smokeless tobacco: Not on file  . Alcohol Use: Not on file  . Drug Use: Not on file  . Sexually Active: Not on file   Other Topics Concern  . Not on file   Social History Narrative   MarriedLawyer3 childrenNever smokedAlcohol use--yes    Review of Systems:  All systems reviewed.  They are negative to the above problem except as previously  stated.  Vital Signs: BP 115/68  Pulse 60  Ht 5\' 8"  (1.727 m)  Wt 204 lb (92.534 kg)  BMI 31.02 kg/m2  Physical Exam  Patient is in NAD  HEENT:  Normocephalic, atraumatic. EOMI, PERRLA.  Neck: JVP is normal. No thyromegaly. No bruits.  Lungs: clear to auscultation. No rales no wheezes.  Heart: Regular rate and rhythm. Normal S1, S2. No S3.   No significant murmurs. PMI not displaced.  Abdomen:  Supple, nontender. Normal bowel sounds. No masses. No hepatomegaly.  Extremities:   Good distal pulses throughout. No lower extremity edema.  Musculoskeletal :moving all extremities.  Neuro:   alert and oriented x3.  CN II-XII grossly intact.  EKG:  Sinus rhythm.  60 bpm.  RBBB.  LVH.   Assessment and Plan:

## 2010-10-26 NOTE — Assessment & Plan Note (Signed)
Patient needs to be more careful with diet.  Will review recent labs at Dr. [name]' office.

## 2010-10-26 NOTE — Assessment & Plan Note (Signed)
Last echo in 2011 showed moderate MR.  Will review.  He will need to have f/u at some point.

## 2010-10-26 NOTE — Assessment & Plan Note (Signed)
Doing well.  He has been on lopressor since his MI.  Low dose.  BP is excellent.  He could try pulling back and see if this gives him more energy. Encouraged him to stay active.

## 2015-07-29 DIAGNOSIS — Z79899 Other long term (current) drug therapy: Secondary | ICD-10-CM | POA: Diagnosis not present

## 2015-07-29 DIAGNOSIS — N529 Male erectile dysfunction, unspecified: Secondary | ICD-10-CM | POA: Diagnosis not present

## 2015-07-29 DIAGNOSIS — N400 Enlarged prostate without lower urinary tract symptoms: Secondary | ICD-10-CM | POA: Diagnosis not present

## 2015-07-29 DIAGNOSIS — R946 Abnormal results of thyroid function studies: Secondary | ICD-10-CM | POA: Diagnosis not present

## 2015-07-29 DIAGNOSIS — Z0001 Encounter for general adult medical examination with abnormal findings: Secondary | ICD-10-CM | POA: Diagnosis not present

## 2015-07-29 DIAGNOSIS — E559 Vitamin D deficiency, unspecified: Secondary | ICD-10-CM | POA: Diagnosis not present

## 2015-07-29 DIAGNOSIS — H6120 Impacted cerumen, unspecified ear: Secondary | ICD-10-CM | POA: Diagnosis not present

## 2015-07-29 DIAGNOSIS — E782 Mixed hyperlipidemia: Secondary | ICD-10-CM | POA: Diagnosis not present

## 2015-07-29 DIAGNOSIS — R350 Frequency of micturition: Secondary | ICD-10-CM | POA: Diagnosis not present

## 2015-07-29 DIAGNOSIS — I69919 Unspecified symptoms and signs involving cognitive functions following unspecified cerebrovascular disease: Secondary | ICD-10-CM | POA: Diagnosis not present

## 2015-08-22 ENCOUNTER — Encounter: Payer: PRIVATE HEALTH INSURANCE | Admitting: Internal Medicine

## 2015-08-24 DIAGNOSIS — R351 Nocturia: Secondary | ICD-10-CM | POA: Diagnosis not present

## 2015-08-24 DIAGNOSIS — R350 Frequency of micturition: Secondary | ICD-10-CM | POA: Diagnosis not present

## 2015-08-24 DIAGNOSIS — N401 Enlarged prostate with lower urinary tract symptoms: Secondary | ICD-10-CM | POA: Diagnosis not present

## 2015-08-24 DIAGNOSIS — R3915 Urgency of urination: Secondary | ICD-10-CM | POA: Diagnosis not present

## 2015-09-30 ENCOUNTER — Encounter: Payer: Self-pay | Admitting: Internal Medicine

## 2015-10-12 DIAGNOSIS — Z23 Encounter for immunization: Secondary | ICD-10-CM | POA: Diagnosis not present

## 2015-10-17 ENCOUNTER — Encounter: Payer: Self-pay | Admitting: Internal Medicine

## 2015-10-17 ENCOUNTER — Encounter (INDEPENDENT_AMBULATORY_CARE_PROVIDER_SITE_OTHER): Payer: Self-pay

## 2015-10-17 ENCOUNTER — Ambulatory Visit (INDEPENDENT_AMBULATORY_CARE_PROVIDER_SITE_OTHER): Payer: BLUE CROSS/BLUE SHIELD | Admitting: Internal Medicine

## 2015-10-17 VITALS — BP 142/86 | HR 73 | Ht 68.0 in | Wt 203.8 lb

## 2015-10-17 DIAGNOSIS — I10 Essential (primary) hypertension: Secondary | ICD-10-CM

## 2015-10-17 MED ORDER — ROSUVASTATIN CALCIUM 10 MG PO TABS: 10.0000 mg | ORAL_TABLET | Freq: Every day | ORAL | 3 refills | Status: DC

## 2015-10-17 NOTE — Patient Instructions (Signed)
Your physician has recommended you make the following change in your medication:  1.) start Crestor (rosuvastatin) 10 mg once a day Your physician wants you to follow-up in: 1 year with [name].  You will receive a reminder letter in the mail two months in advance. If you don't receive a letter, please call our office to schedule the follow-up appointment.

## 2015-10-17 NOTE — Progress Notes (Signed)
Cardiology Office Note   Date:  10/17/2015   ID:  [name], DOB [birth year], MRN [id number]  PCP:  Henrine Screws, MD  Cardiologist:   Dorris Carnes, MD    F/U of MV dz     History of Present Illness: [name] is a 60 y.o. male with a history of endocarditis in [id number]  Complicated by MI and also emobokc event to brain with resultant CVA then bleed  I saw him last in October 2012   Since seen he has done well from a cardiac standpoint.  Breathing is OK  NO CP  NO palpitations  No dizziness He walks some , goes to gym a couple times per week though he does not like going  He is followed by [facility]  Lipids in July LDL was 203  HDL 61  Review of that note the pt's diet is not good   He says he tried Lipitor in past  Complains of memory issues  Says that he thinks he tried Crestor  ? Memory issue   Last echo in July 2011 bileaflet prolapse with mod MR      Outpatient Medications Prior to Visit  Medication Sig Dispense Refill  . ARIPiprazole (ABILIFY) 10 MG tablet Take 10 mg by mouth daily.      . Armodafinil (NUVIGIL) 150 MG tablet Take 150 mg by mouth daily.      . cholecalciferol (VITAMIN D) 1000 UNITS tablet Take 1,000 Units by mouth daily. 2 tabs daily     . desvenlafaxine (PRISTIQ) 100 MG 24 hr tablet Take 100 mg by mouth daily.      . metoprolol tartrate (LOPRESSOR) 25 MG tablet 1/2 TAB BID     . simvastatin (ZOCOR) 40 MG tablet Take 40 mg by mouth at bedtime.       No facility-administered medications prior to visit.      Allergies:   Review of patient's allergies indicates no known allergies.   Past Medical History:  Diagnosis Date  . CVA (cerebral infarction)    hemmorhagic  CVA  . Dyslipidemia   . Endocarditis    MV Vegitation  . Mitral regurgitation   . Myocardial infarction     Past Surgical History:  Procedure Laterality Date  . NO PAST SURGERIES       Social History:  The patient  reports that he has never smoked. He has never used  smokeless tobacco. He reports that he does not drink alcohol or use drugs.   Family History:  The patient's family history includes Heart disease in his mother.    ROS:  Please see the history of present illness. All other systems are reviewed and  Negative to the above problem except as noted.    PHYSICAL EXAM: VS:  BP (!) 142/86   Pulse 73   Ht 5\' 8"  (1.727 m)   Wt 203 lb 12.8 oz (92.4 kg)   BMI 30.99 kg/m   GEN: Well nourished, well developed, in no acute distress  HEENT: normal  Neck: no JVD, carotid bruits, or masses Cardiac: RRR; no murmurs, rubs, or gallops,no edema  Respiratory:  clear to auscultation bilaterally, normal work of breathing GI: soft, nontender, nondistended, + BS  No hepatomegaly  MS: no deformity Moving all extremities   Skin: warm and dry, no rash Neuro:  Strength and sensation are intact Psych: euthymic mood, full affect   EKG:  EKG is ordered today.    SR 73 bpm   Occasional  PVC  RBBB  LVH    Lipid Panel    Component Value Date/Time   CHOL 241 (H) 01/19/2010 0904   TRIG 112.0 01/19/2010 0904   HDL 56.10 01/19/2010 0904   CHOLHDL 4 01/19/2010 0904   VLDL 22.4 01/19/2010 0904   LDLCALC 94 01/13/2009 0842   LDLDIRECT 164.1 01/19/2010 0904      Wt Readings from Last 3 Encounters:  10/17/15 203 lb 12.8 oz (92.4 kg)  10/26/10 204 lb (92.5 kg)  01/13/10 196 lb 12 oz (89.2 kg)      ASSESSMENT AND PLAN:  1  MV dz  There is no murmur on exam   I would follow for now  I would hold off on scheduling another echo    2  HL  Profound  He should retry statin  Recomm Crestor 10 mg  F/U lipid in 8 wks   Discussed low fat diet    3  BP  Follow  Diastolic is  mldly increased at 32    ENcouraged him to stay actvive  INcrease walking   Plan for f/u in 1 year       Current medicines are reviewed at length with the patient today.  The patient does not have concerns regarding medicines.  Signed, Dorris Carnes, MD  10/17/2015 8:46 AM    [address], [place], [place]  [address] Phone: [phone]; Fax: [phone]

## 2015-10-19 DIAGNOSIS — F3342 Major depressive disorder, recurrent, in full remission: Secondary | ICD-10-CM | POA: Diagnosis not present

## 2015-10-26 DIAGNOSIS — N401 Enlarged prostate with lower urinary tract symptoms: Secondary | ICD-10-CM | POA: Diagnosis not present

## 2015-10-26 DIAGNOSIS — R351 Nocturia: Secondary | ICD-10-CM | POA: Diagnosis not present

## 2015-10-29 ENCOUNTER — Inpatient Hospital Stay (HOSPITAL_COMMUNITY)
Admission: EM | Admit: 2015-10-29 | Discharge: 2015-11-08 | DRG: 871 | Disposition: A | Discharge status: Rehabilitation Facility | Payer: BLUE CROSS/BLUE SHIELD | Attending: Internal Medicine | Admitting: Internal Medicine

## 2015-10-29 ENCOUNTER — Emergency Department (HOSPITAL_COMMUNITY): Payer: BLUE CROSS/BLUE SHIELD

## 2015-10-29 ENCOUNTER — Encounter (HOSPITAL_COMMUNITY): Payer: Self-pay | Admitting: *Deleted

## 2015-10-29 DIAGNOSIS — N2889 Other specified disorders of kidney and ureter: Secondary | ICD-10-CM | POA: Diagnosis not present

## 2015-10-29 DIAGNOSIS — R7881 Bacteremia: Secondary | ICD-10-CM | POA: Diagnosis not present

## 2015-10-29 DIAGNOSIS — J9690 Respiratory failure, unspecified, unspecified whether with hypoxia or hypercapnia: Secondary | ICD-10-CM | POA: Diagnosis not present

## 2015-10-29 DIAGNOSIS — E876 Hypokalemia: Secondary | ICD-10-CM | POA: Diagnosis present

## 2015-10-29 DIAGNOSIS — R451 Restlessness and agitation: Secondary | ICD-10-CM | POA: Diagnosis not present

## 2015-10-29 DIAGNOSIS — E871 Hypo-osmolality and hyponatremia: Secondary | ICD-10-CM | POA: Diagnosis not present

## 2015-10-29 DIAGNOSIS — Z4682 Encounter for fitting and adjustment of non-vascular catheter: Secondary | ICD-10-CM | POA: Diagnosis not present

## 2015-10-29 DIAGNOSIS — E785 Hyperlipidemia, unspecified: Secondary | ICD-10-CM | POA: Diagnosis present

## 2015-10-29 DIAGNOSIS — Z79899 Other long term (current) drug therapy: Secondary | ICD-10-CM

## 2015-10-29 DIAGNOSIS — I081 Rheumatic disorders of both mitral and tricuspid valves: Secondary | ICD-10-CM | POA: Diagnosis present

## 2015-10-29 DIAGNOSIS — Z8673 Personal history of transient ischemic attack (TIA), and cerebral infarction without residual deficits: Secondary | ICD-10-CM | POA: Diagnosis not present

## 2015-10-29 DIAGNOSIS — R509 Fever, unspecified: Secondary | ICD-10-CM

## 2015-10-29 DIAGNOSIS — R5381 Other malaise: Secondary | ICD-10-CM | POA: Diagnosis not present

## 2015-10-29 DIAGNOSIS — A419 Sepsis, unspecified organism: Secondary | ICD-10-CM

## 2015-10-29 DIAGNOSIS — M47812 Spondylosis without myelopathy or radiculopathy, cervical region: Secondary | ICD-10-CM | POA: Diagnosis not present

## 2015-10-29 DIAGNOSIS — I248 Other forms of acute ischemic heart disease: Secondary | ICD-10-CM | POA: Diagnosis present

## 2015-10-29 DIAGNOSIS — H6091 Unspecified otitis externa, right ear: Secondary | ICD-10-CM | POA: Diagnosis present

## 2015-10-29 DIAGNOSIS — G039 Meningitis, unspecified: Secondary | ICD-10-CM

## 2015-10-29 DIAGNOSIS — C659 Malignant neoplasm of unspecified renal pelvis: Secondary | ICD-10-CM | POA: Diagnosis present

## 2015-10-29 DIAGNOSIS — N179 Acute kidney failure, unspecified: Secondary | ICD-10-CM | POA: Diagnosis present

## 2015-10-29 DIAGNOSIS — Z9079 Acquired absence of other genital organ(s): Secondary | ICD-10-CM | POA: Diagnosis not present

## 2015-10-29 DIAGNOSIS — A401 Sepsis due to streptococcus, group B: Secondary | ICD-10-CM | POA: Diagnosis not present

## 2015-10-29 DIAGNOSIS — Z8679 Personal history of other diseases of the circulatory system: Secondary | ICD-10-CM

## 2015-10-29 DIAGNOSIS — J9601 Acute respiratory failure with hypoxia: Secondary | ICD-10-CM | POA: Diagnosis not present

## 2015-10-29 DIAGNOSIS — R1310 Dysphagia, unspecified: Secondary | ICD-10-CM | POA: Diagnosis present

## 2015-10-29 DIAGNOSIS — R836 Abnormal cytological findings in cerebrospinal fluid: Secondary | ICD-10-CM | POA: Diagnosis not present

## 2015-10-29 DIAGNOSIS — D72829 Elevated white blood cell count, unspecified: Secondary | ICD-10-CM

## 2015-10-29 DIAGNOSIS — I340 Nonrheumatic mitral (valve) insufficiency: Secondary | ICD-10-CM | POA: Diagnosis not present

## 2015-10-29 DIAGNOSIS — G9340 Encephalopathy, unspecified: Secondary | ICD-10-CM

## 2015-10-29 DIAGNOSIS — E872 Acidosis: Secondary | ICD-10-CM | POA: Diagnosis present

## 2015-10-29 DIAGNOSIS — N289 Disorder of kidney and ureter, unspecified: Secondary | ICD-10-CM | POA: Diagnosis not present

## 2015-10-29 DIAGNOSIS — A491 Streptococcal infection, unspecified site: Secondary | ICD-10-CM | POA: Diagnosis not present

## 2015-10-29 DIAGNOSIS — I10 Essential (primary) hypertension: Secondary | ICD-10-CM | POA: Diagnosis present

## 2015-10-29 DIAGNOSIS — R778 Other specified abnormalities of plasma proteins: Secondary | ICD-10-CM

## 2015-10-29 DIAGNOSIS — R7989 Other specified abnormal findings of blood chemistry: Secondary | ICD-10-CM

## 2015-10-29 DIAGNOSIS — I341 Nonrheumatic mitral (valve) prolapse: Secondary | ICD-10-CM | POA: Diagnosis not present

## 2015-10-29 DIAGNOSIS — G9341 Metabolic encephalopathy: Secondary | ICD-10-CM | POA: Diagnosis present

## 2015-10-29 DIAGNOSIS — R4182 Altered mental status, unspecified: Secondary | ICD-10-CM | POA: Diagnosis not present

## 2015-10-29 DIAGNOSIS — R6520 Severe sepsis without septic shock: Secondary | ICD-10-CM | POA: Diagnosis present

## 2015-10-29 DIAGNOSIS — R402431 Glasgow coma scale score 3-8, in the field [EMT or ambulance]: Secondary | ICD-10-CM | POA: Diagnosis not present

## 2015-10-29 DIAGNOSIS — N400 Enlarged prostate without lower urinary tract symptoms: Secondary | ICD-10-CM | POA: Diagnosis not present

## 2015-10-29 DIAGNOSIS — I639 Cerebral infarction, unspecified: Secondary | ICD-10-CM

## 2015-10-29 DIAGNOSIS — C649 Malignant neoplasm of unspecified kidney, except renal pelvis: Secondary | ICD-10-CM

## 2015-10-29 DIAGNOSIS — G009 Bacterial meningitis, unspecified: Secondary | ICD-10-CM | POA: Diagnosis not present

## 2015-10-29 DIAGNOSIS — H60501 Unspecified acute noninfective otitis externa, right ear: Secondary | ICD-10-CM | POA: Diagnosis not present

## 2015-10-29 DIAGNOSIS — R410 Disorientation, unspecified: Secondary | ICD-10-CM

## 2015-10-29 DIAGNOSIS — Z9289 Personal history of other medical treatment: Secondary | ICD-10-CM

## 2015-10-29 DIAGNOSIS — F1010 Alcohol abuse, uncomplicated: Secondary | ICD-10-CM | POA: Diagnosis present

## 2015-10-29 DIAGNOSIS — R4587 Impulsiveness: Secondary | ICD-10-CM | POA: Diagnosis not present

## 2015-10-29 DIAGNOSIS — H60391 Other infective otitis externa, right ear: Secondary | ICD-10-CM | POA: Diagnosis not present

## 2015-10-29 DIAGNOSIS — I159 Secondary hypertension, unspecified: Secondary | ICD-10-CM | POA: Diagnosis not present

## 2015-10-29 DIAGNOSIS — H6122 Impacted cerumen, left ear: Secondary | ICD-10-CM | POA: Diagnosis not present

## 2015-10-29 DIAGNOSIS — D72820 Lymphocytosis (symptomatic): Secondary | ICD-10-CM | POA: Diagnosis not present

## 2015-10-29 DIAGNOSIS — I252 Old myocardial infarction: Secondary | ICD-10-CM

## 2015-10-29 DIAGNOSIS — I6930 Unspecified sequelae of cerebral infarction: Secondary | ICD-10-CM

## 2015-10-29 DIAGNOSIS — R748 Abnormal levels of other serum enzymes: Secondary | ICD-10-CM | POA: Diagnosis not present

## 2015-10-29 DIAGNOSIS — J9600 Acute respiratory failure, unspecified whether with hypoxia or hypercapnia: Secondary | ICD-10-CM

## 2015-10-29 DIAGNOSIS — I351 Nonrheumatic aortic (valve) insufficiency: Secondary | ICD-10-CM | POA: Diagnosis not present

## 2015-10-29 DIAGNOSIS — Z95828 Presence of other vascular implants and grafts: Secondary | ICD-10-CM | POA: Diagnosis not present

## 2015-10-29 DIAGNOSIS — N281 Cyst of kidney, acquired: Secondary | ICD-10-CM | POA: Diagnosis present

## 2015-10-29 HISTORY — DX: Bacteremia: R78.81

## 2015-10-29 HISTORY — DX: Cerebral infarction, unspecified: I63.9

## 2015-10-29 LAB — URINALYSIS, ROUTINE W REFLEX MICROSCOPIC
Bilirubin Urine: NEGATIVE
GLUCOSE, UA: NEGATIVE mg/dL
KETONES UR: NEGATIVE mg/dL
Leukocytes, UA: NEGATIVE
Nitrite: NEGATIVE
PROTEIN: NEGATIVE mg/dL
Specific Gravity, Urine: 1.015 (ref 1.005–1.030)
pH: 7.5 (ref 5.0–8.0)

## 2015-10-29 LAB — BLOOD CULTURE ID PANEL (REFLEXED)
ACINETOBACTER BAUMANNII: NOT DETECTED
CANDIDA ALBICANS: NOT DETECTED
CANDIDA GLABRATA: NOT DETECTED
Candida krusei: NOT DETECTED
Candida parapsilosis: NOT DETECTED
Candida tropicalis: NOT DETECTED
ENTEROBACTER CLOACAE COMPLEX: NOT DETECTED
ENTEROBACTERIACEAE SPECIES: NOT DETECTED
ENTEROCOCCUS SPECIES: NOT DETECTED
ESCHERICHIA COLI: NOT DETECTED
Haemophilus influenzae: NOT DETECTED
Klebsiella oxytoca: NOT DETECTED
Klebsiella pneumoniae: NOT DETECTED
LISTERIA MONOCYTOGENES: NOT DETECTED
NEISSERIA MENINGITIDIS: NOT DETECTED
PSEUDOMONAS AERUGINOSA: NOT DETECTED
Proteus species: NOT DETECTED
STAPHYLOCOCCUS SPECIES: NOT DETECTED
STREPTOCOCCUS AGALACTIAE: DETECTED — AB
STREPTOCOCCUS PNEUMONIAE: NOT DETECTED
STREPTOCOCCUS PYOGENES: NOT DETECTED
Serratia marcescens: NOT DETECTED
Staphylococcus aureus (BCID): NOT DETECTED
Streptococcus species: DETECTED — AB

## 2015-10-29 LAB — CBC WITH DIFFERENTIAL/PLATELET
BASOS PCT: 0 %
Basophils Absolute: 0 10*3/uL (ref 0.0–0.1)
Eosinophils Absolute: 0 10*3/uL (ref 0.0–0.7)
Eosinophils Relative: 0 %
HEMATOCRIT: 48.7 % (ref 39.0–52.0)
Hemoglobin: 16.6 g/dL (ref 13.0–17.0)
LYMPHS PCT: 3 %
Lymphs Abs: 0.3 10*3/uL — ABNORMAL LOW (ref 0.7–4.0)
MCH: 30.1 pg (ref 26.0–34.0)
MCHC: 34.1 g/dL (ref 30.0–36.0)
MCV: 88.2 fL (ref 78.0–100.0)
MONO ABS: 0.2 10*3/uL (ref 0.1–1.0)
MONOS PCT: 2 %
NEUTROS ABS: 11.1 10*3/uL — AB (ref 1.7–7.7)
Neutrophils Relative %: 95 %
Platelets: 210 10*3/uL (ref 150–400)
RBC: 5.52 MIL/uL (ref 4.22–5.81)
RDW: 12.8 % (ref 11.5–15.5)
WBC: 11.7 10*3/uL — ABNORMAL HIGH (ref 4.0–10.5)

## 2015-10-29 LAB — CBC
HEMATOCRIT: 41.3 % (ref 39.0–52.0)
Hemoglobin: 13.6 g/dL (ref 13.0–17.0)
MCH: 29.9 pg (ref 26.0–34.0)
MCHC: 32.9 g/dL (ref 30.0–36.0)
MCV: 90.8 fL (ref 78.0–100.0)
Platelets: 182 10*3/uL (ref 150–400)
RBC: 4.55 MIL/uL (ref 4.22–5.81)
RDW: 13.4 % (ref 11.5–15.5)
WBC: 24.4 10*3/uL — ABNORMAL HIGH (ref 4.0–10.5)

## 2015-10-29 LAB — RESPIRATORY PANEL BY PCR
ADENOVIRUS-RVPPCR: NOT DETECTED
BORDETELLA PERTUSSIS-RVPCR: NOT DETECTED
CHLAMYDOPHILA PNEUMONIAE-RVPPCR: NOT DETECTED
CORONAVIRUS 229E-RVPPCR: NOT DETECTED
CORONAVIRUS HKU1-RVPPCR: NOT DETECTED
CORONAVIRUS NL63-RVPPCR: NOT DETECTED
Coronavirus OC43: NOT DETECTED
Influenza A: NOT DETECTED
Influenza B: NOT DETECTED
MYCOPLASMA PNEUMONIAE-RVPPCR: NOT DETECTED
Metapneumovirus: NOT DETECTED
Parainfluenza Virus 1: NOT DETECTED
Parainfluenza Virus 2: NOT DETECTED
Parainfluenza Virus 3: NOT DETECTED
Parainfluenza Virus 4: NOT DETECTED
Respiratory Syncytial Virus: NOT DETECTED
Rhinovirus / Enterovirus: NOT DETECTED

## 2015-10-29 LAB — GLUCOSE, CAPILLARY
GLUCOSE-CAPILLARY: 136 mg/dL — AB (ref 65–99)
GLUCOSE-CAPILLARY: 129 mg/dL — AB (ref 65–99)
GLUCOSE-CAPILLARY: 129 mg/dL — AB (ref 65–99)
Glucose-Capillary: 133 mg/dL — ABNORMAL HIGH (ref 65–99)

## 2015-10-29 LAB — I-STAT ARTERIAL BLOOD GAS, ED
ACID-BASE DEFICIT: 1 mmol/L (ref 0.0–2.0)
Bicarbonate: 23.4 mmol/L (ref 20.0–28.0)
O2 SAT: 95 %
TCO2: 25 mmol/L (ref 0–100)
pCO2 arterial: 40.2 mmHg (ref 32.0–48.0)
pH, Arterial: 7.381 (ref 7.350–7.450)
pO2, Arterial: 84 mmHg (ref 83.0–108.0)

## 2015-10-29 LAB — MAGNESIUM: MAGNESIUM: 1.4 mg/dL — AB (ref 1.7–2.4)

## 2015-10-29 LAB — URINE MICROSCOPIC-ADD ON: WBC, UA: NONE SEEN WBC/hpf (ref 0–5)

## 2015-10-29 LAB — COMPREHENSIVE METABOLIC PANEL
ALBUMIN: 4.6 g/dL (ref 3.5–5.0)
ALK PHOS: 85 U/L (ref 38–126)
ALT: 35 U/L (ref 17–63)
ANION GAP: 13 (ref 5–15)
AST: 35 U/L (ref 15–41)
BUN: 16 mg/dL (ref 6–20)
CALCIUM: 9.5 mg/dL (ref 8.9–10.3)
CO2: 26 mmol/L (ref 22–32)
Chloride: 102 mmol/L (ref 101–111)
Creatinine, Ser: 1.24 mg/dL (ref 0.61–1.24)
GFR calc Af Amer: >60 mL/min (ref >60)
GFR calc non Af Amer: >60 mL/min (ref >60)
GLUCOSE: 117 mg/dL — AB (ref 65–99)
Potassium: 3.7 mmol/L (ref 3.5–5.1)
SODIUM: 141 mmol/L (ref 135–145)
Total Bilirubin: 1.1 mg/dL (ref 0.3–1.2)
Total Protein: 7.4 g/dL (ref 6.5–8.1)

## 2015-10-29 LAB — I-STAT CG4 LACTIC ACID, ED: Lactic Acid, Venous: 3.2 mmol/L (ref 0.5–1.9)

## 2015-10-29 LAB — APTT: APTT: 25 s (ref 24–36)

## 2015-10-29 LAB — TROPONIN I
TROPONIN I: 0.1 ng/mL — AB (ref <0.03)
TROPONIN I: 0.1 ng/mL — AB (ref <0.03)
Troponin I: 0.05 ng/mL (ref <0.03)

## 2015-10-29 LAB — PHOSPHORUS: Phosphorus: 3 mg/dL (ref 2.5–4.6)

## 2015-10-29 LAB — PROTIME-INR
INR: 1.08
PROTHROMBIN TIME: 14.1 s (ref 11.4–15.2)

## 2015-10-29 LAB — CREATININE, SERUM
Creatinine, Ser: 1.19 mg/dL (ref 0.61–1.24)
GFR calc Af Amer: >60 mL/min (ref >60)
GFR calc non Af Amer: >60 mL/min (ref >60)

## 2015-10-29 LAB — LACTIC ACID, PLASMA: LACTIC ACID, VENOUS: 3 mmol/L — AB (ref 0.5–1.9)

## 2015-10-29 LAB — MRSA PCR SCREENING: MRSA BY PCR: NEGATIVE

## 2015-10-29 MED ORDER — ORAL CARE MOUTH RINSE
15.0000 mL | OROMUCOSAL | Status: DC
  Administered 2015-10-29 – 2015-11-02 (×40): 15 mL via OROMUCOSAL

## 2015-10-29 MED ORDER — PIPERACILLIN-TAZOBACTAM 3.375 G IVPB
3.3750 g | Freq: Three times a day (TID) | INTRAVENOUS | Status: DC
  Administered 2015-10-29 – 2015-10-31 (×7): 3.375 g via INTRAVENOUS
  Filled 2015-10-29 (×9): qty 50

## 2015-10-29 MED ORDER — MIDAZOLAM HCL 2 MG/2ML IJ SOLN
2.0000 mg | Freq: Once | INTRAMUSCULAR | Status: AC
  Administered 2015-10-29: 2 mg via INTRAVENOUS
  Filled 2015-10-29: qty 2

## 2015-10-29 MED ORDER — VITAL HIGH PROTEIN PO LIQD
1000.0000 mL | ORAL | Status: DC
  Administered 2015-10-29 – 2015-10-31 (×3): 1000 mL

## 2015-10-29 MED ORDER — PIPERACILLIN-TAZOBACTAM 3.375 G IVPB 30 MIN
3.3750 g | Freq: Once | INTRAVENOUS | Status: AC
  Administered 2015-10-29: 3.375 g via INTRAVENOUS
  Filled 2015-10-29: qty 50

## 2015-10-29 MED ORDER — LORAZEPAM 2 MG/ML IJ SOLN
2.0000 mg | Freq: Once | INTRAMUSCULAR | Status: AC
  Administered 2015-10-29: 2 mg via INTRAVENOUS
  Filled 2015-10-29: qty 1

## 2015-10-29 MED ORDER — PROPOFOL 1000 MG/100ML IV EMUL
5.0000 ug/kg/min | Freq: Once | INTRAVENOUS | Status: AC
  Administered 2015-10-29: 5 ug/kg/min via INTRAVENOUS
  Filled 2015-10-29: qty 100

## 2015-10-29 MED ORDER — ACETAMINOPHEN 650 MG RE SUPP
650.0000 mg | Freq: Once | RECTAL | Status: AC
  Administered 2015-10-29: 650 mg via RECTAL
  Filled 2015-10-29: qty 1

## 2015-10-29 MED ORDER — PRO-STAT SUGAR FREE PO LIQD
60.0000 mL | Freq: Four times a day (QID) | ORAL | Status: DC
  Administered 2015-10-29 – 2015-10-31 (×11): 60 mL
  Filled 2015-10-29 (×10): qty 60

## 2015-10-29 MED ORDER — PANTOPRAZOLE SODIUM 40 MG PO TBEC
40.0000 mg | DELAYED_RELEASE_TABLET | Freq: Every day | ORAL | Status: DC
  Administered 2015-10-29 – 2015-10-30 (×2): 40 mg via ORAL
  Filled 2015-10-29 (×2): qty 1

## 2015-10-29 MED ORDER — SODIUM CHLORIDE 0.9 % IV BOLUS (SEPSIS)
1000.0000 mL | Freq: Once | INTRAVENOUS | Status: AC
  Administered 2015-10-29: 1000 mL via INTRAVENOUS

## 2015-10-29 MED ORDER — SODIUM CHLORIDE 0.9 % IV SOLN: 250.0000 mL | INTRAVENOUS | Status: DC | PRN

## 2015-10-29 MED ORDER — ROSUVASTATIN CALCIUM 10 MG PO TABS
10.0000 mg | ORAL_TABLET | Freq: Every day | ORAL | Status: DC
  Administered 2015-10-29 – 2015-11-08 (×10): 10 mg via ORAL
  Filled 2015-10-29 (×10): qty 1

## 2015-10-29 MED ORDER — PROPOFOL 1000 MG/100ML IV EMUL
5.0000 ug/kg/min | INTRAVENOUS | Status: DC
  Administered 2015-10-29 – 2015-10-30 (×8): 40 ug/kg/min via INTRAVENOUS
  Administered 2015-10-30: 30 ug/kg/min via INTRAVENOUS
  Administered 2015-10-31 (×2): 40 ug/kg/min via INTRAVENOUS
  Filled 2015-10-29 (×12): qty 100

## 2015-10-29 MED ORDER — ASPIRIN 300 MG RE SUPP: 300.0000 mg | RECTAL | Status: AC

## 2015-10-29 MED ORDER — VANCOMYCIN HCL IN DEXTROSE 1-5 GM/200ML-% IV SOLN
1000.0000 mg | Freq: Once | INTRAVENOUS | Status: AC
  Administered 2015-10-29: 1000 mg via INTRAVENOUS
  Filled 2015-10-29: qty 200

## 2015-10-29 MED ORDER — VANCOMYCIN HCL 10 G IV SOLR
1250.0000 mg | Freq: Two times a day (BID) | INTRAVENOUS | Status: DC
  Administered 2015-10-29 – 2015-10-31 (×4): 1250 mg via INTRAVENOUS
  Filled 2015-10-29 (×5): qty 1250

## 2015-10-29 MED ORDER — FENTANYL CITRATE (PF) 100 MCG/2ML IJ SOLN
100.0000 ug | Freq: Once | INTRAMUSCULAR | Status: AC
  Administered 2015-10-29: 100 ug via INTRAVENOUS
  Filled 2015-10-29: qty 2

## 2015-10-29 MED ORDER — MAGNESIUM SULFATE 2 GM/50ML IV SOLN
2.0000 g | Freq: Once | INTRAVENOUS | Status: AC
  Administered 2015-10-29: 2 g via INTRAVENOUS
  Filled 2015-10-29: qty 50

## 2015-10-29 MED ORDER — ASPIRIN 81 MG PO CHEW
324.0000 mg | CHEWABLE_TABLET | ORAL | Status: AC
  Administered 2015-10-29: 324 mg via ORAL
  Filled 2015-10-29: qty 4

## 2015-10-29 MED ORDER — ETOMIDATE 2 MG/ML IV SOLN
INTRAVENOUS | Status: DC | PRN
  Administered 2015-10-29: 150 mg via INTRAVENOUS

## 2015-10-29 MED ORDER — ENOXAPARIN SODIUM 40 MG/0.4ML ~~LOC~~ SOLN: 40.0000 mg | Freq: Every day | SUBCUTANEOUS | Status: DC

## 2015-10-29 MED ORDER — CHLORHEXIDINE GLUCONATE 0.12% ORAL RINSE (MEDLINE KIT)
15.0000 mL | Freq: Two times a day (BID) | OROMUCOSAL | Status: DC
  Administered 2015-10-29 – 2015-11-02 (×9): 15 mL via OROMUCOSAL

## 2015-10-29 MED ORDER — SUCCINYLCHOLINE CHLORIDE 20 MG/ML IJ SOLN
INTRAMUSCULAR | Status: DC | PRN
  Administered 2015-10-29: 100 mg via INTRAVENOUS

## 2015-10-29 NOTE — ED Notes (Signed)
Patient arrived via EMS covered in feces.  Wife stated they went to bed around 1030.  She got up around 230 and found him on the BR floor covered in feces.  She stated he answered her when she was questioning him but upon arrival to the ED patient was restless, thrashing his arms about, attempting to sit up, grabbing at people.  Patient had a procedure done by urologist for an enlarged prostate on Wednesday and was given Cipro.  Stated he was fine when they went to bed - no complaints were voiced.  EMS adm 5mg  Valium and patient was still restless and moving about unable to control patient

## 2015-10-29 NOTE — H&P (Signed)
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 In the ED he was found to be febrile and combative and required intubation. Two days prior to his presentation he had a urologic procedure done (details unclear), but it was a trans-urethral and trans-rectal approach to address his prostatic hypertrophy. He was given a dose of cipro to take the afternoon of his procedure, and according to his wife seemed to be well for the day following his procedure. They went to bed the evening prior to his presentation without incident, but his wife found him lying on the bathroom floor, having been incontinent of bowel and bladder. However, he had taken his pants off and was in the bathroom and appeared to have been attempting to intentionally move his bowels and void on the toilet but fell over before he could be seated on the toilet. She found him down, awake, but not lucid.  PAST MEDICAL HISTORY :  He  has a past medical history of CVA (cerebral infarction); Dyslipidemia; Endocarditis; Mitral regurgitation; Myocardial infarction; and Stroke ([place]).  PAST SURGICAL HISTORY: He  has a past surgical history that includes No past surgeries.  No Known Allergies  No current facility-administered medications on file prior to encounter.    Current Outpatient Prescriptions on File Prior to Encounter  Medication Sig  . BuPROPion HCl (WELLBUTRIN PO) Take by mouth as directed.  . rosuvastatin (CRESTOR) 10 MG tablet Take 1 tablet (10 mg total) by mouth daily.    FAMILY HISTORY:  His indicated that his mother is deceased. He indicated that his father is deceased.    SOCIAL HISTORY: He  reports that he has never smoked. He has  never used smokeless tobacco. He reports that he does not drink alcohol or use drugs.  REVIEW OF SYSTEMS:   Unable to assess  SUBJECTIVE:  60 y/o man with hx of endocarditis presenting with fever, AMS, following a urologic procedure  VITAL SIGNS: BP 143/86   Pulse 110   Temp 102.2 F (39 C) (Rectal)   Resp 19   Ht 5\' 8"  (1.727 m)   Wt 204 lb (92.5 kg)   SpO2 100%   BMI 31.02 kg/m   HEMODYNAMICS:    VENTILATOR SETTINGS: Vent Mode: PRVC FiO2 (%):  [50 %] 50 % Set Rate:  [15 bmp] 15 bmp Vt Set:  [550 mL] 550 mL PEEP:  [5 cmH20] 5 cmH20 Plateau Pressure:  [13 cmH20] 13 cmH20  INTAKE / OUTPUT: No intake/output data recorded.  PHYSICAL EXAMINATION: General:  Well-appearing man Neuro:  Sedated HEENT:  Small lac on forehead, MMM, ETT in place Cardiovascular:  Tachycardic Lungs:  CTA Abdomen:  Clear Musculoskeletal:  No apparent injuries to extremities Skin:  No rashes  LABS:  BMET  Recent Labs Lab 10/29/15 0355  NA 141  K 3.7  CL 102  CO2 26  BUN 16  CREATININE 1.24  GLUCOSE 117*    Electrolytes  Recent Labs Lab 10/29/15 0355  CALCIUM 9.5    CBC  Recent Labs Lab 10/29/15 0355  WBC 11.7*  HGB 16.6  HCT 48.7  PLT 210    Coag's  Recent Labs Lab 10/29/15 0355  APTT 25  INR 1.08    Sepsis Markers  Recent Labs Lab 10/29/15 0417  LATICACIDVEN 3.20*    ABG  Recent Labs Lab 10/29/15 0451  PHART 7.381  PCO2ART 40.2  PO2ART 84.0    Liver Enzymes  Recent Labs Lab 10/29/15 0355  AST 35  ALT 35  ALKPHOS 85  BILITOT 1.1  ALBUMIN 4.6    Cardiac Enzymes  Recent Labs Lab 10/29/15 0355  TROPONINI 0.05*    Glucose No results for input(s): GLUCAP in the last 168 hours.  Imaging Dg Chest Portable 1 View  Result Date: 10/29/2015 CLINICAL DATA:  60 year old male with intubation. EXAM: PORTABLE CHEST 1 VIEW COMPARISON:  Chest radiograph dated 10/29/2015 FINDINGS: There has been interval retraction of the endotracheal  tube now approximately 5 cm above the carina. The enteric tube extends into the left hemi abdomen with the side-port in the left upper quadrant and tip beyond the inferior margin of the image. There is stable cardiomegaly with mild congestive changes. No focal consolidation, pleural effusion, or pneumothorax. A bone fragment noted along the inferior aspect of the left glenoid which appears chronic. No acute fracture identified. IMPRESSION: Interval retraction of the endotracheal tube with tip now approximately 5 cm above the carina. Stable cardiomegaly with minimal congestion. Electronically Signed   By: Anner Crete M.D.   On: 10/29/2015 05:41   Dg Chest Portable 1 View  Result Date: 10/29/2015 CLINICAL DATA:  Initial evaluation for endotracheal tube placement. EXAM: PORTABLE CHEST 1 VIEW COMPARISON:  Prior radiograph from earlier the same day. FINDINGS: Patient now intubated with an endotracheal tube in place. Tip positioned approximately 2.2 cm above the carina. Enteric tube courses in the the abdomen. Cardiomegaly stable.  Mediastinal silhouette within normal limits. Lungs hypoinflated. Mild vascular congestion without overt pulmonary edema. No focal infiltrates. No definite pleural effusion. No pneumothorax. No acute osseous abnormality. IMPRESSION: 1. Tip of the endotracheal tube approximately 2.2 cm above the carina. 2. Stable cardiomegaly with diffuse pulmonary vascular congestion. Electronically Signed   By: Jeannine Boga M.D.   On: 10/29/2015 05:33   Dg Chest Port 1 View  Result Date: 10/29/2015 CLINICAL DATA:  60 year old male with altered mental status EXAM: PORTABLE CHEST 1 VIEW COMPARISON:  Chest radiograph dated 03/06/2008 FINDINGS: There is moderate cardiomegaly with mild vascular congestion. No focal consolidation, pleural effusion, or pneumothorax. No acute osseous pathology noted. IMPRESSION: Cardiomegaly with mild congestive changes, new since the prior radiograph.  Electronically Signed   By: Anner Crete M.D.   On: 10/29/2015 04:27     STUDIES:  Head CT >> (read pending) Neck CT >> (read pending)  CULTURES: Blood 10/28 >> in process  ANTIBIOTICS: Vanc  10/28 >> Zosyn 10/28 >>  SIGNIFICANT EVENTS: Intubated in the ED for agitation  LINES/TUBES: ETT 7.5 mm PIV x2 Condom cath OGT  DISCUSSION: 60 y/o p/w sepsis  ASSESSMENT / PLAN:  PULMONARY A: Need for mechanical ventilation P:   Full vent support  CARDIOVASCULAR A:  HLD P:  Continue Crestor  RENAL A:   Lactic acidosis AKI P:   Due to sepsis, trend with hydration  GASTROINTESTINAL A:   No active issues P:    HEMATOLOGIC A:   No active issues P:   INFECTIOUS A:   Severe sepsis P:   Concern for bacteremia from procedure, risk for endocarditis Echo to eval mitral valve F/u blood cx Continue vanc + zosyn  ENDOCRINE A:   No active issues   P:  NEUROLOGIC A:   AMS Hx of stroke P:   Due to sepsis Does not sound like seizure, although possible Concern for reactivation of old stroke. Unknown deficits RASS goal: 0  FAMILY  - Updates: Wife ([place]) updated on admission   - Inter-disciplinary family meet or Palliative Care meeting due by:  11/05/15   CRITICAL CARE Performed by: Luz Brazen   Total critical care time: 45 minutes  Critical care time was exclusive of separately billable procedures and treating other patients.  Critical care was necessary to treat or prevent imminent or life-threatening deterioration.  Critical care was time spent personally by me on the following activities: development of treatment plan with patient and/or surrogate as well as nursing, discussions with consultants, evaluation of patient's response to treatment, examination of patient, obtaining history from patient or surrogate, ordering and performing treatments and interventions, ordering and review of laboratory studies, ordering and review of  radiographic studies, pulse oximetry and re-evaluation of patient's condition.   Luz Brazen, MD Pulmonary and [place] Pager: [phone]  10/29/2015, 6:03 AM

## 2015-10-29 NOTE — Progress Notes (Signed)
CRITICAL VALUE ALERT  Critical value received:  Lactic acid 3.0  Date of notification:  10/29/15  Time of notification:  1205  Critical value read back:Yes.    Nurse who received alert:  San Jetty RN  MD notified (1st page):  Tammy P NP with CCM  Time of first page:  1206  Trending down from initial result. No new orders given.

## 2015-10-29 NOTE — Progress Notes (Signed)
eLink Physician-Brief Progress Note Patient Name: [name] DOB: 1955-11-24 MRN: [id number]   Date of Service  10/29/2015  HPI/Events of Note  Low magnesium  eICU Interventions  replaced     Intervention Category Minor Interventions: Electrolytes abnormality - evaluation and management  [name], P 10/29/2015, 6:44 PM

## 2015-10-29 NOTE — Progress Notes (Signed)
Pt admitted to [facility]. Vitals signs stable, pt responding to pain, pupils round a reactive, skin intact. Will continue to monitor pt.

## 2015-10-29 NOTE — Progress Notes (Signed)
Initial Nutrition Assessment  DOCUMENTATION CODES:   Obesity unspecified  INTERVENTION:  Recommend initiating Vital High Protein @ 10 ml/hr via OG tube.  Pro-Stat 60 ml 4 times daily, each 30 ml container provides 100 kcal, 15 grams protein. This regimen will provide 1336 kcal, 141 grams protein, 202 ml H2O daily with current propofol rate.   NUTRITION DIAGNOSIS:   Inadequate oral intake related to inability to eat as evidenced by NPO status.  GOAL:   Provide needs based on ASPEN/SCCM guidelines  MONITOR:   Vent status, Labs, Weight trends, I & O's, TF tolerance  REASON FOR ASSESSMENT:   Ventilator    ASSESSMENT:   60 y/o man with a hx of mitral valve prolapse w/ endocarditis and stroke in 2012 who presented to the ED after being found by his wife altered. In the ED he was found to be febrile and combative and required intubation. Two days prior to his presentation he had a urologic procedure done (details unclear), but it was a trans-urethral and trans-rectal approach to address his prostatic hypertrophy.   No family present at time of assessment. Patient appears well-nourished. Discussed with RN. Patient will remain intubated and plan is to TF patient.  Patient is currently intubated on ventilator support MV: 10.9 L/min Temp (24hrs), Avg:100.3 F (37.9 C), Min:99.4 F (37.4 C), Max:102.2 F (39 C)  Propofol: 11.2 ml/hr (296 kcal daily)  Access: OG tube that extends into left hemi abdomen per chest x-ray (though tip beyond inferior margin of image)  MAP: 86; has been >65 since admission  Medications reviewed and include: pantoprazole, propofol gtt.  Labs reviewed: CBG 129-136.  Nutrition-Focused physical exam completed. Findings are no fat depletion, no muscle depletion, and no edema.   Diet Order:  Diet NPO time specified  Skin:  Reviewed, no issues  Last BM:  Unknown  Height:   Ht Readings from Last 1 Encounters:  10/29/15 5\' 8"  (1.727 m)     Weight:   Wt Readings from Last 1 Encounters:  10/29/15 204 lb (92.5 kg)    Ideal Body Weight:  70 kg  BMI:  Body mass index is 31.02 kg/m.  Estimated Nutritional Needs:   Kcal:  [id number] (11-14 kcal/kg)  Protein:  >/= 140 grams (>/= 2 grams/kg)  Fluid:  1.8-2 L/day  EDUCATION NEEDS:   No education needs identified at this time  Willey Blade, MS, RD, LDN Pager: [phone] After Hours Pager: [phone]

## 2015-10-29 NOTE — Progress Notes (Signed)
PULMONARY / CRITICAL CARE MEDICINE   Name: [name] MRN: [id number] DOB: 07/01/1955    ADMISSION DATE:  10/29/2015  CHIEF COMPLAINT:  AMS  HISTORY OF PRESENT ILLNESS:   [name] is a 60 y/o man with a hx of mitral valve prolapse w/ endocarditis and stroke in 2012 who presented to the ED after being found by his wife altered. In the ED he was found to be febrile and combative and required intubation. Two days prior to his presentation he had a urologic procedure done (details unclear), but it was a trans-urethral and trans-rectal approach to address his prostatic hypertrophy. He was given a dose of cipro to take the afternoon of his procedure, and according to his wife seemed to be well for the day following his procedure. They went to bed the evening prior to his presentation without incident, but his wife found him lying on the bathroom floor, having been incontinent of bowel and bladder. However, he had taken his pants off and was in the bathroom and appeared to have been attempting to intentionally move his bowels and void on the toilet but fell over before he could be seated on the toilet. She found him down, awake, but not lucid.  SUBJECTIVE:  No events, agitated when off sedation but no purposeful.  VITAL SIGNS: BP 106/76   Pulse (!) 105   Temp 99.4 F (37.4 C) (Oral)   Resp 18   Ht 5\' 8"  (1.727 m)   Wt 92.5 kg (204 lb)   SpO2 100%   BMI 31.02 kg/m   HEMODYNAMICS:    VENTILATOR SETTINGS: Vent Mode: PRVC FiO2 (%):  [40 %-50 %] 40 % Set Rate:  [15 bmp] 15 bmp Vt Set:  [550 mL] 550 mL PEEP:  [5 cmH20] 5 cmH20 Plateau Pressure:  [13 cmH20-16 cmH20] 16 cmH20  INTAKE / OUTPUT: I/O last 3 completed shifts: In: 6505.8 [I.V.:3255.8; IV Piggyback:3250] Out: 150 [Urine:100; Other:50]  PHYSICAL EXAMINATION: General:  Well-appearing man, sedated and intubated Neuro:  Sedated, intubated, moves all ext to pain but not command, pupils are reactive. HEENT:  Small lac on  forehead, MMM, ETT in place, neck supple. Cardiovascular:  RRR, Nl S1/S2, -M/R/G. Lungs:  CTA B. Abdomen:  Soft, NT, ND and +BS. Musculoskeletal:  No apparent injuries to extremities Skin:  No rashes  LABS:  BMET  Recent Labs Lab 10/29/15 0355 10/29/15 0845  NA 141  --   K 3.7  --   CL 102  --   CO2 26  --   BUN 16  --   CREATININE 1.24 1.19  GLUCOSE 117*  --    Electrolytes  Recent Labs Lab 10/29/15 0355  CALCIUM 9.5   CBC  Recent Labs Lab 10/29/15 0355 10/29/15 0845  WBC 11.7* 24.4*  HGB 16.6 13.6  HCT 48.7 41.3  PLT 210 182   Coag's  Recent Labs Lab 10/29/15 0355  APTT 25  INR 1.08   Sepsis Markers  Recent Labs Lab 10/29/15 0417 10/29/15 1127  LATICACIDVEN 3.20* 3.0*   ABG  Recent Labs Lab 10/29/15 0451  PHART 7.381  PCO2ART 40.2  PO2ART 84.0   Liver Enzymes  Recent Labs Lab 10/29/15 0355  AST 35  ALT 35  ALKPHOS 85  BILITOT 1.1  ALBUMIN 4.6   Cardiac Enzymes  Recent Labs Lab 10/29/15 0355 10/29/15 1117  TROPONINI 0.05* 0.10*   Glucose  Recent Labs Lab 10/29/15 0847 10/29/15 1128  GLUCAP 136* 129*  129*   Imaging  Ct Head Wo Contrast  Result Date: 10/29/2015 CLINICAL DATA:  60 year old male with confusion. Patient was found bathroom having seizure. EXAM: CT HEAD WITHOUT CONTRAST CT CERVICAL SPINE WITHOUT CONTRAST TECHNIQUE: Multidetector CT imaging of the head and cervical spine was performed following the standard protocol without intravenous contrast. Multiplanar CT image reconstructions of the cervical spine were also generated. COMPARISON:  Head CT dated 02/24/2008 and MRI dated 02/26/2008 FINDINGS: CT HEAD FINDINGS Brain: There is mild age-related atrophy and chronic microvascular ischemic changes. An area of low-attenuation with volume loss involving the left temporal lobe likely represents encephalomalacia sequela of prior infarct. An area of low attenuation seen in the same area on the CT of 2010. There is no  acute intracranial hemorrhage. No mass effect or midline shift. No extra-axial fluid collection. Vascular: No hyperdense vessel or unexpected calcification. Skull: Normal. Negative for fracture or focal lesion. Sinuses/Orbits: There is mild diffuse mucoperiosteal thickening of paranasal sinuses. No air-fluid levels. The mastoid air cells are clear. The globes and retro-orbital fat are preserved. Other: An endotracheal and enteric tubes are partially visualized. CT CERVICAL SPINE FINDINGS Alignment: No acute subluxation. Skull base and vertebrae: No acute fracture. There is incomplete bony fusion of the posterior ring of C1. Soft tissues and spinal canal: No prevertebral fluid or swelling. No visible canal hematoma. An endotracheal and enteric tube are partially visualized. Disc levels: Mild degenerative changes most prominent at C5-C6 and C6-C7. Upper chest: Negative. Other: None IMPRESSION: No acute intracranial hemorrhage. Old-appearing left temporal lobe infarct and encephalomalacia. No acute/traumatic cervical spine pathology. Electronically Signed   By: Anner Crete M.D.   On: 10/29/2015 06:25   Ct Cervical Spine Wo Contrast  Result Date: 10/29/2015 CLINICAL DATA:  60 year old male with confusion. Patient was found bathroom having seizure. EXAM: CT HEAD WITHOUT CONTRAST CT CERVICAL SPINE WITHOUT CONTRAST TECHNIQUE: Multidetector CT imaging of the head and cervical spine was performed following the standard protocol without intravenous contrast. Multiplanar CT image reconstructions of the cervical spine were also generated. COMPARISON:  Head CT dated 02/24/2008 and MRI dated 02/26/2008 FINDINGS: CT HEAD FINDINGS Brain: There is mild age-related atrophy and chronic microvascular ischemic changes. An area of low-attenuation with volume loss involving the left temporal lobe likely represents encephalomalacia sequela of prior infarct. An area of low attenuation seen in the same area on the CT of 2010. There  is no acute intracranial hemorrhage. No mass effect or midline shift. No extra-axial fluid collection. Vascular: No hyperdense vessel or unexpected calcification. Skull: Normal. Negative for fracture or focal lesion. Sinuses/Orbits: There is mild diffuse mucoperiosteal thickening of paranasal sinuses. No air-fluid levels. The mastoid air cells are clear. The globes and retro-orbital fat are preserved. Other: An endotracheal and enteric tubes are partially visualized. CT CERVICAL SPINE FINDINGS Alignment: No acute subluxation. Skull base and vertebrae: No acute fracture. There is incomplete bony fusion of the posterior ring of C1. Soft tissues and spinal canal: No prevertebral fluid or swelling. No visible canal hematoma. An endotracheal and enteric tube are partially visualized. Disc levels: Mild degenerative changes most prominent at C5-C6 and C6-C7. Upper chest: Negative. Other: None IMPRESSION: No acute intracranial hemorrhage. Old-appearing left temporal lobe infarct and encephalomalacia. No acute/traumatic cervical spine pathology. Electronically Signed   By: Anner Crete M.D.   On: 10/29/2015 06:25   Dg Chest Portable 1 View  Result Date: 10/29/2015 CLINICAL DATA:  60 year old male with intubation. EXAM: PORTABLE CHEST 1 VIEW COMPARISON:  Chest radiograph dated 10/29/2015 FINDINGS: There has been  interval retraction of the endotracheal tube now approximately 5 cm above the carina. The enteric tube extends into the left hemi abdomen with the side-port in the left upper quadrant and tip beyond the inferior margin of the image. There is stable cardiomegaly with mild congestive changes. No focal consolidation, pleural effusion, or pneumothorax. A bone fragment noted along the inferior aspect of the left glenoid which appears chronic. No acute fracture identified. IMPRESSION: Interval retraction of the endotracheal tube with tip now approximately 5 cm above the carina. Stable cardiomegaly with minimal  congestion. Electronically Signed   By: Anner Crete M.D.   On: 10/29/2015 05:41   Dg Chest Portable 1 View  Result Date: 10/29/2015 CLINICAL DATA:  Initial evaluation for endotracheal tube placement. EXAM: PORTABLE CHEST 1 VIEW COMPARISON:  Prior radiograph from earlier the same day. FINDINGS: Patient now intubated with an endotracheal tube in place. Tip positioned approximately 2.2 cm above the carina. Enteric tube courses in the the abdomen. Cardiomegaly stable.  Mediastinal silhouette within normal limits. Lungs hypoinflated. Mild vascular congestion without overt pulmonary edema. No focal infiltrates. No definite pleural effusion. No pneumothorax. No acute osseous abnormality. IMPRESSION: 1. Tip of the endotracheal tube approximately 2.2 cm above the carina. 2. Stable cardiomegaly with diffuse pulmonary vascular congestion. Electronically Signed   By: Jeannine Boga M.D.   On: 10/29/2015 05:33   Dg Chest Port 1 View  Result Date: 10/29/2015 CLINICAL DATA:  60 year old male with altered mental status EXAM: PORTABLE CHEST 1 VIEW COMPARISON:  Chest radiograph dated 03/06/2008 FINDINGS: There is moderate cardiomegaly with mild vascular congestion. No focal consolidation, pleural effusion, or pneumothorax. No acute osseous pathology noted. IMPRESSION: Cardiomegaly with mild congestive changes, new since the prior radiograph. Electronically Signed   By: Anner Crete M.D.   On: 10/29/2015 04:27   STUDIES:  Head CT >> negative Neck CT >> negative  CULTURES: Blood 10/28 >> in process  ANTIBIOTICS: Vanc  10/28 >> Zosyn 10/28 >>  SIGNIFICANT EVENTS: 10/28>>>Intubated in the ED for agitation  LINES/TUBES: ETT 7.5 mm PIV x2 Condom cath OGT  DISCUSSION: 60 y/o p/w sepsis  ASSESSMENT / PLAN:  PULMONARY A: Need for mechanical ventilation P:   Full vent support. ABG and adjust vent accordingly.  CARDIOVASCULAR A:  HLD P:  Continue Crestor. Tele  monitoring.  RENAL A:   Lactic acidosis P:   Due to sepsis, trend with hydration BMET in AM Replace electrolytes as indicated. NS   GASTROINTESTINAL A:   No active issues P:   Consult nutrition for TF as per nutrition. PPI  HEMATOLOGIC A:   No active issues P:  CBC in AM. Transfuse per ICU protocol.  INFECTIOUS A:   Severe sepsis P:   Concern for bacteremia from procedure, risk for endocarditis. Echo to eval mitral valve, pending. F/u blood cx. Continue vanc+zosyn.  ENDOCRINE A:   No active issues   P:   Monitor  NEUROLOGIC A:   AMS Hx of stroke Neck is supple, doubt meningitis P:   Due to sepsis Does not sound like seizure, although possible Concern for reactivation of old stroke. Unknown deficits RASS goal: 0 Neurology consult called (?MRI and EEG).  FAMILY  - Updates: Wife ([name]) updated bedside   - Inter-disciplinary family meet or Palliative Care meeting due by:  11/05/15  The patient is critically ill with multiple organ systems failure and requires high complexity decision making for assessment and support, frequent evaluation and titration of therapies, application of advanced monitoring technologies  and extensive interpretation of multiple databases.   Critical Care Time devoted to patient care services described in this note is  35  Minutes. This time reflects time of care of this signee Dr Jennet Maduro. This critical care time does not reflect procedure time, or teaching time or supervisory time of PA/NP/Med student/Med Resident etc but could involve care discussion time.  Rush Farmer, M.D. [facility] Pulmonary/Critical Care Medicine. Pager: [phone]. After hours pager: [phone].  10/29/2015, 2:07 PM

## 2015-10-29 NOTE — Progress Notes (Signed)
PHARMACY - PHYSICIAN COMMUNICATION CRITICAL VALUE ALERT - BLOOD CULTURE IDENTIFICATION (BCID)  Results for orders placed or performed during the hospital encounter of 10/29/15  Blood Culture ID Panel (Reflexed) (Collected: 10/29/2015  4:00 AM)  Result Value Ref Range   Enterococcus species NOT DETECTED NOT DETECTED   Listeria monocytogenes NOT DETECTED NOT DETECTED   Staphylococcus species NOT DETECTED NOT DETECTED   Staphylococcus aureus NOT DETECTED NOT DETECTED   Streptococcus species DETECTED (A) NOT DETECTED   Streptococcus agalactiae DETECTED (A) NOT DETECTED   Streptococcus pneumoniae NOT DETECTED NOT DETECTED   Streptococcus pyogenes NOT DETECTED NOT DETECTED   Acinetobacter baumannii NOT DETECTED NOT DETECTED   Enterobacteriaceae species NOT DETECTED NOT DETECTED   Enterobacter cloacae complex NOT DETECTED NOT DETECTED   Escherichia coli NOT DETECTED NOT DETECTED   Klebsiella oxytoca NOT DETECTED NOT DETECTED   Klebsiella pneumoniae NOT DETECTED NOT DETECTED   Proteus species NOT DETECTED NOT DETECTED   Serratia marcescens NOT DETECTED NOT DETECTED   Haemophilus influenzae NOT DETECTED NOT DETECTED   Neisseria meningitidis NOT DETECTED NOT DETECTED   Pseudomonas aeruginosa NOT DETECTED NOT DETECTED   Candida albicans NOT DETECTED NOT DETECTED   Candida glabrata NOT DETECTED NOT DETECTED   Candida krusei NOT DETECTED NOT DETECTED   Candida parapsilosis NOT DETECTED NOT DETECTED   Candida tropicalis NOT DETECTED NOT DETECTED    Name of physician (or Provider) Contacted: [place]  Changes to prescribed antibiotics required: No changes at this time per MD - would recommend penicillin g 4 million units IV Q4H when ready  [name], [name] 10/29/2015  3:44 PM

## 2015-10-29 NOTE — Progress Notes (Signed)
I met with patient's wife and have reviewed his clinical course. Please call with any change in condition or clinical concerns.

## 2015-10-29 NOTE — Consult Note (Signed)
Neurology Consult Note  Reason for Consultation: Altered Mental Status  Requesting provider: Jennet Maduro, MD  CC: Unable to obtain as the patient is intubated and sedated  HPI: This is a 60 year old man admitted for altered mental status. History is obtained from review of the medical record as the patient is intubated and sedated and therefore unable to provide any information. No family is present at the bedside.  According to notes, the patient was found by his wife covered in feces in the bathroom of his home early this morning. It sounds as if he was in his usual state of health when he went to bed last night. When she found him, he had been incontinent of bowel and bladder. The admission note states that "he take his pants off and was in the bathroom and appeared to have been attempting to intentionally move his bowels and void on the toilet but fell over before he could be seated on the toilet." He was awake but confused when she found him. Two days ago he had a transurethral/transrectal procedure performed by urology for BPH. He reportedly had one dose of ciprofloxacin around his procedure and initially did well for the first day afterwards.  In the ED, he was noted to be agitated, speaking incoherently. He was moving all 4 extremities with no focality noted by the ED physician. He was febrile with leukocytosis, felt to be septic. ED nursing notes indicate that the patient was restless, thrashing about, grabbing at people. His behavior did not respond to benzodiazepines and he was intubated because of his agitation. He was placed on empiric vancomycin and Zosyn and admitted to the ICU. Neurology consultation is now requested for additional recommendations.  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 I have limited information in the available records about these events. There is a cardiology note from October 2012 mentioning his endocarditis but  beyond that no specific details aren't available at this time.  PMH:  Past Medical History:  Diagnosis Date  . CVA (cerebral infarction)    hemmorhagic  CVA  . Dyslipidemia   . Endocarditis    MV Vegitation  . Mitral regurgitation   . Myocardial infarction   . Stroke [facility])     PSH:  Past Surgical History:  Procedure Laterality Date  . NO PAST SURGERIES      Family history: Family History  Problem Relation Age of Onset  . Heart disease Mother     mom RF as a child (died of heart problem in  24 )    Social history:  Social History   Social History  . Marital status: Legally Separated    Spouse name: N/A  . Number of children: N/A  . Years of education: N/A   Occupational History  . Not on file.   Social History Main Topics  . Smoking status: Never Smoker  . Smokeless tobacco: Never Used  . Alcohol use No  . Drug use: No  . Sexual activity: Not on file   Other Topics Concern  . 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 0.9 %  sodium chloride infusion  250 mL Intravenous PRN Luz Brazen, MD      . chlorhexidine gluconate (MEDLINE KIT) (  PERIDEX) 0.12 % solution 15 mL  15 mL Mouth Rinse BID Raylene Miyamoto, MD   15 mL at 10/29/15 0800  . etomidate (AMIDATE) injection   Intravenous PRN Ripley Fraise, MD   150 mg at 10/29/15 0434  . feeding supplement (PRO-STAT SUGAR FREE 64) liquid 60 mL  60 mL Per Tube QID Raylene Miyamoto, MD      . feeding supplement (VITAL HIGH PROTEIN) liquid 1,000 mL  1,000 mL Per Tube Q24H Raylene Miyamoto, MD      . MEDLINE mouth rinse  15 mL Mouth Rinse 10 times per day Raylene Miyamoto, MD   15 mL at 10/29/15 1200  . pantoprazole (PROTONIX) EC tablet 40 mg  40 mg Oral Q1200 Tammy S Parrett, NP   40 mg at 10/29/15 1215  .  piperacillin-tazobactam (ZOSYN) IVPB 3.375 g  3.375 g Intravenous Q8H Erenest Blank, RPH   3.375 g at 10/29/15 1200  . propofol (DIPRIVAN) 1000 MG/100ML infusion  5-80 mcg/kg/min Intravenous Continuous Luz Brazen, MD 22.2 mL/hr at 10/29/15 1130 40 mcg/kg/min at 10/29/15 1130  . rosuvastatin (CRESTOR) tablet 10 mg  10 mg Oral Daily Luz Brazen, MD   10 mg at 10/29/15 0959  . succinylcholine (ANECTINE) injection   Intravenous PRN Ripley Fraise, MD   100 mg at 10/29/15 0435  . vancomycin (VANCOCIN) 1,250 mg in sodium chloride 0.9 % 250 mL IVPB  1,250 mg Intravenous Q12H Erenest Blank, RPH        Allergies: No Known Allergies  ROS: As per HPI. A full 14-point review of systems Cannot be obtained as the patient is unable to provide any information since he is intubated and sedated.   PE:  BP 106/76   Pulse (!) 105   Temp 99.4 F (37.4 C) (Oral)   Resp 18   Ht _0  (1.727 m)   Wt 92.5 kg (204 lb)   SpO2 100%   BMI 31.02 kg/m   General: WDWN, intubated and sedated on a propofol drip in the ICU. He does not open his eyes spontaneously or with stimulation. He moves spontaneously and non-purposefully with tactile stimulation. He does not follow commands.  He is diaphoretic with sweat covering his head and face.  HEENT: Normocephalic. Neck supple without LAD. ETT, OGT in place. Sclerae anicteric. No conjunctival injection.  CV: Regular, tachy, no obvious murmur. Carotid pulses full and symmetric, no bruits. Distal pulses 2+ and symmetric.  Lungs: CTAB on anterior exam. Ventilated.  Abdomen: Soft, non-distended, no rebound or guarding. Bowel sounds present x4.  Extremities: No C/C/E. Neuro:  CN: Pupils are equal and round. They are symmetrically reactive from 3-->2 mm. He does not blink to visual threat. Eyes are conjugate. Oculocephalics are intact. Corneals are intact and symmetric. Face is symmetric at rest with symmetric grimace. The remainder of his cranial nerve exam is limited  as he is unable to participate.   Motor: Normal bulk, tone. He moves the right side with good strength. There is relatively less movement on the left, particularly the LLE. No tremor or other abnormal movements.  Sensation: He grimaces and withdraws purposefully from nailbed pressure on the right side and the left arm. He has flexion to nailbed pressure in the LLE.  DTRs: 3+, symmetric. Toes mute. No pathologic reflexes.  Coordination and gait: These cannot be assessed as the patient is unable to participate with the exam.   Labs:  Lab Results  Component Value Date   WBC  24.4 (H) 10/29/2015   HGB 13.6 10/29/2015   HCT 41.3 10/29/2015   PLT 182 10/29/2015   GLUCOSE 117 (H) 10/29/2015   CHOL 241 (H) 01/19/2010   TRIG 112.0 01/19/2010   HDL 56.10 01/19/2010   LDLDIRECT 164.1 01/19/2010   LDLCALC 94 01/13/2009   ALT 35 10/29/2015   AST 35 10/29/2015   NA 141 10/29/2015   K 3.7 10/29/2015   CL 102 10/29/2015   CREATININE 1.19 10/29/2015   BUN 16 10/29/2015   CO2 26 10/29/2015   INR 1.08 10/29/2015   HGBA1C  03/09/2008    5.6 (NOTE)   The ADA recommends the following therapeutic goal for glycemic   control related to Hgb A1C measurement:   Goal of Therapy:   < 7.0% Hgb A1C   Reference: American Diabetes Association: Clinical Practice   Recommendations 2008, Diabetes Care,  2008, 31:(Suppl 1).   Troponin 0.10 Lactate 3.20--> 3.0 Urinalysis notable for trace hemoglobin Blood culture with gram-positive cocci in chains, ID pending  Imaging:  I have personally and independently reviewed the CT scan of the head without contrast from today. This shows some encephalomalacia in the left temporal lobe consistent with previous infarction noted on scans in February 2010. There is a linear area of hypodensity underlying the right and slow, consistent with encephalomalacia. This is in the region of a previous hemorrhagic infarction from February 2010. Mild diffuse chronic small vessel ischemic  changes are noted in the bihemispheric white matter. Mild diffuse generalized atrophy is also present. No obvious acute abnormality is appreciated.  Assessment and Plan:  1. Acute encephalopathy: This is in the setting of sepsis. He has no meningismus to suggest meningitis. He has no evidence of seizure activity on his current examination. While seizure cannot be excluded in the pre-hospital setting, this would most likely be considered provoked in the setting of sepsis if it occurred. Blood cultures are positive with ID of the organism pending. This is most suggestive of septic encephalopathy. Treat underlying infection as you are. Optimize metabolic status as you are. Limit sedation as tolerated. CT of the head did not show any obvious acute abnormality. If his encephalopathy persists following adequate treatment of his infection, consideration could be given to MRI scan of the brain at that time.  2. 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 This appears to have occurred in 2010 based upon available imaging. CT and MRI scan at that time showed a hemorrhagic infarct in the right insular region with several scattered areas of ischemic infarction involving both posterior and anterior circulations. Additional risk factors for cerebrovascular disease including history of dyslipidemia. Continue statin. Given mechanism for previous stroke, no indication for antiplatelet therapy unless other indications exist.  3. Possible left hemiparesis: On observation, he has relatively less movement of the left leg and possibly the left arm as well. This may be recrudescence of old deficits in the setting of sepsis given history of right hemispheric hemorrhagic infarction. This can be observed. If this persists, consider MRI scan of the brain to exclude acute ischemia.  Thank you for this consultation. Neurology will continue to follow with you. Please  call if any questions or concerns.  This patient is critically ill and at significant risk of neurological worsening, death and care requires constant monitoring of vital signs, hemodynamics,respiratory and cardiac monitoring, neurological assessment, discussion with family, other specialists and medical decision making of high complexity. A total  of 60 minutes of critical care time was spent on this case.

## 2015-10-29 NOTE — ED Provider Notes (Signed)
[facility] DEPT Provider Note   CSN: [id number] Arrival date & time: 10/29/15  [id number]   By signing my name below, I, Maud Deed. Royston Sinner, attest that this documentation has been prepared under the direction and in the presence of Ripley Fraise, MD.  Electronically Signed: Maud Deed. Royston Sinner, ED Scribe. 10/29/15. 4:02 AM.   History   Chief Complaint Chief Complaint  Patient presents with  . Altered Mental Status   The history is provided by the EMS personnel and the spouse. No language interpreter was used.    LEVEL 5 CAVEAT DUE TO ALTERED MENTAL STATUS   HPI Comments: [name], brought in by EMS is a 60 y.o. male with a PMHx of CVA, MI, mitral regurgitation, and endocarditis who presents to the Emergency Department here for altered mental status this evening. Per EMS, pt was found in the bathroom by his wife covered in feces. She denies noting any seizure like activity. She states "i couldn't make sense of what he was trying to say". En route to department, pt was extremely combative and non-verbal. He was given a total of 5 mg of Versed without any significantly improvement. Wife states pt had approximately 2 glasses of wine this evening.   Pt recently underwent a urologic procedure 2 days ago. At that time, pt was prescribed Cipro which he has been complaint with.  PCP: Henrine Screws, MD   CARDIOLOGIST: Dorris Carnes, MD  Past Medical History:  Diagnosis Date  . CVA (cerebral infarction)    hemmorhagic  CVA  . Dyslipidemia   . Endocarditis    MV Vegitation  . Mitral regurgitation   . Myocardial infarction     Patient Active Problem List   Diagnosis Date Noted  . Dyslipidemia 10/26/2010  . Mitral valve disorders(424.0) 08/25/2008  . DVT 08/25/2008  . SEIZURE DISORDER 08/25/2008  . CHEST PAIN 08/25/2008  . DYSPHAGIA UNSPECIFIED 08/25/2008  . MYOCARDIAL INFARCTION, ANTERIOR WALL, INITIAL EPISODE 06/18/2008  . CEREBRAL EMBOLISM WITH CEREBRAL INFARCTION 06/18/2008   . Other and unspecified hyperlipidemia 06/11/2008  . Essential hypertension 06/11/2008  . ENDOCARDITIS, BACTERIAL, ACUTE 06/11/2008  . FOLLICULITIS 0000000  . NEPHROLITHIASIS, HX OF 06/11/2008    Past Surgical History:  Procedure Laterality Date  . NO PAST SURGERIES         Home Medications    Prior to Admission medications   Medication Sig Start Date End Date Taking? Authorizing Provider  BuPROPion HCl (WELLBUTRIN PO) Take by mouth as directed.    Historical Provider, MD  rosuvastatin (CRESTOR) 10 MG tablet Take 1 tablet (10 mg total) by mouth daily. 10/17/15 01/15/16  Fay Records, MD    Family History Family History  Problem Relation Age of Onset  . Heart disease Mother     mom RF as a child (died of heart problem in  70 )    Social History Social History  Substance Use Topics  . Smoking status: Never Smoker  . Smokeless tobacco: Never Used  . Alcohol use No     Allergies   Review of patient's allergies indicates no known allergies.   Review of Systems Review of Systems  Unable to perform ROS: Mental status change     Physical Exam Updated Vital Signs BP 162/91 (BP Location: Right Arm)   Pulse (!) 125   Temp 102.2 F (39 C) (Rectal)   Resp 26   Ht 5\' 8"  (1.727 m)   Wt 92.5 kg   SpO2 100%   BMI 31.02 kg/m  Physical Exam  CONSTITUTIONAL: Disheveled and agitated HEAD: Normocephalic/atraumatic EYES: EOMI/PERRL ENMT: Mucous membranes moist NECK: supple no meningeal signs SPINE/BACK:No bruising/crepitance/stepoffs noted to spine  CV: S1/S2 noted, no murmurs/rubs/gallops noted. Tachycardic  LUNGS: Tac hypnic and course breath sounds bilaterally  ABDOMEN: soft, nontender, no rebound or guarding, bowel sounds noted throughout abdomen [id number] appearance  NEURO: Pt is awake but agitated. Speaking incoherently . Moves all extremities x 4  No focal weakness noted EXTREMITIES: pulses normal/equal, full ROM SKIN: warm, covered in feces throughout  body PSYCH: Unable to assess   ED Treatments / Results   DIAGNOSTIC STUDIES: Oxygen Saturation is 100% on RA, Normal by my interpretation.    COORDINATION OF CARE: 3:52 AM- Will order blood work, EKG, and imaging. Discussed treatment plan with pt at bedside and pt agreed to plan.     Labs (all labs ordered are listed, but only abnormal results are displayed) Labs Reviewed  COMPREHENSIVE METABOLIC PANEL - Abnormal; Notable for the following:       Result Value   Glucose, Bld 117 (*)    All other components within normal limits  CBC WITH DIFFERENTIAL/PLATELET - Abnormal; Notable for the following:    WBC 11.7 (*)    Neutro Abs 11.1 (*)    Lymphs Abs 0.3 (*)    All other components within normal limits  TROPONIN I - Abnormal; Notable for the following:    Troponin I 0.05 (*)    All other components within normal limits  I-STAT CG4 LACTIC ACID, ED - Abnormal; Notable for the following:    Lactic Acid, Venous 3.20 (*)    All other components within normal limits  CULTURE, BLOOD (ROUTINE X 2)  CULTURE, BLOOD (ROUTINE X 2)  URINE CULTURE  APTT  PROTIME-INR  URINALYSIS, ROUTINE W REFLEX MICROSCOPIC (NOT AT [facility])  CBC  CREATININE, SERUM  I-STAT ARTERIAL BLOOD GAS, ED    EKG  EKG Interpretation  Date/Time:  Saturday October 29 2015 03:49:14 EDT Ventricular Rate:  123 PR Interval:    QRS Duration: 144 QT Interval:  329 QTC Calculation: 471 R Axis:   -22 Text Interpretation:  Sinus tachycardia Ventricular premature complex Probable left atrial enlargement Right bundle branch block Left ventricular hypertrophy Abnormal ekg rate is faster when compared to prior Confirmed by Christy Gentles  MD, Mayan Dolney ([id number]) on 10/29/2015 4:19:15 AM       Radiology Dg Chest Portable 1 View  Result Date: 10/29/2015 CLINICAL DATA:  60 year old male with intubation. EXAM: PORTABLE CHEST 1 VIEW COMPARISON:  Chest radiograph dated 10/29/2015 FINDINGS: There has been interval retraction of the  endotracheal tube now approximately 5 cm above the carina. The enteric tube extends into the left hemi abdomen with the side-port in the left upper quadrant and tip beyond the inferior margin of the image. There is stable cardiomegaly with mild congestive changes. No focal consolidation, pleural effusion, or pneumothorax. A bone fragment noted along the inferior aspect of the left glenoid which appears chronic. No acute fracture identified. IMPRESSION: Interval retraction of the endotracheal tube with tip now approximately 5 cm above the carina. Stable cardiomegaly with minimal congestion. Electronically Signed   By: Anner Crete M.D.   On: 10/29/2015 05:41   Dg Chest Portable 1 View  Result Date: 10/29/2015 CLINICAL DATA:  Initial evaluation for endotracheal tube placement. EXAM: PORTABLE CHEST 1 VIEW COMPARISON:  Prior radiograph from earlier the same day. FINDINGS: Patient now intubated with an endotracheal tube in place. Tip positioned approximately 2.2 cm above  the carina. Enteric tube courses in the the abdomen. Cardiomegaly stable.  Mediastinal silhouette within normal limits. Lungs hypoinflated. Mild vascular congestion without overt pulmonary edema. No focal infiltrates. No definite pleural effusion. No pneumothorax. No acute osseous abnormality. IMPRESSION: 1. Tip of the endotracheal tube approximately 2.2 cm above the carina. 2. Stable cardiomegaly with diffuse pulmonary vascular congestion. Electronically Signed   By: Jeannine Boga M.D.   On: 10/29/2015 05:33   Dg Chest Port 1 View  Result Date: 10/29/2015 CLINICAL DATA:  60 year old male with altered mental status EXAM: PORTABLE CHEST 1 VIEW COMPARISON:  Chest radiograph dated 03/06/2008 FINDINGS: There is moderate cardiomegaly with mild vascular congestion. No focal consolidation, pleural effusion, or pneumothorax. No acute osseous pathology noted. IMPRESSION: Cardiomegaly with mild congestive changes, new since the prior  radiograph. Electronically Signed   By: Anner Crete M.D.   On: 10/29/2015 04:27    Procedures Procedures  CRITICAL CARE Performed by: Sharyon Cable Total critical care time: 45 minutes Critical care time was exclusive of separately billable procedures and treating other patients. Critical care was necessary to treat or prevent imminent or life-threatening deterioration. Critical care was time spent personally by me on the following activities: development of treatment plan with patient and/or surrogate as well as nursing, discussions with consultants, evaluation of patient's response to treatment, examination of patient, obtaining history from patient or surrogate, ordering and performing treatments and interventions, ordering and review of laboratory studies, ordering and review of radiographic studies, pulse oximetry and re-evaluation of patient's condition.  INTUBATION Performed by: Sharyon Cable  Required items: required  devices, and special equipment available Patient identity confirmed: provided demographic data and hospital-assigned identification number Time out: deferred due to emergent procedure  Indications: altered mental status  Intubation method: Glidescope Laryngoscopy   Preoxygenation: BVM  Sedatives: Etomidate Paralytic: Succinylcholine  Tube Size: 7.5 cuffed  Post-procedure assessment: chest rise and ETCO2 monitor Breath sounds: equal and absent over the epigastrium Tube secured with: ETT holder Chest x-ray interpreted by radiologist and me.  Chest x-ray findings: endotracheal tube in appropriate position  Patient tolerated the procedure well with no immediate complications.    Medications Ordered in ED Medications  piperacillin-tazobactam (ZOSYN) IVPB 3.375 g (not administered)  vancomycin (VANCOCIN) 1,250 mg in sodium chloride 0.9 % 250 mL IVPB (not administered)  0.9 %  sodium chloride infusion (not administered)  aspirin chewable  tablet 324 mg (not administered)    Or  aspirin suppository 300 mg (not administered)  enoxaparin (LOVENOX) injection 40 mg (not administered)  rosuvastatin (CRESTOR) tablet 10 mg (not administered)  etomidate (AMIDATE) injection (150 mg Intravenous Given 10/29/15 0434)  succinylcholine (ANECTINE) injection (100 mg Intravenous Given 10/29/15 0435)  sodium chloride 0.9 % bolus 1,000 mL (0 mLs Intravenous Stopped 10/29/15 0500)    And  sodium chloride 0.9 % bolus 1,000 mL (0 mLs Intravenous Stopped 10/29/15 0510)    And  sodium chloride 0.9 % bolus 1,000 mL (1,000 mLs Intravenous New Bag/Given 10/29/15 0510)  piperacillin-tazobactam (ZOSYN) IVPB 3.375 g (0 g Intravenous Stopped 10/29/15 0430)  vancomycin (VANCOCIN) IVPB 1000 mg/200 mL premix (0 mg Intravenous Stopped 10/29/15 0530)  acetaminophen (TYLENOL) suppository 650 mg (650 mg Rectal Given 10/29/15 0457)  LORazepam (ATIVAN) injection 2 mg (2 mg Intravenous Given 10/29/15 0402)  propofol (DIPRIVAN) 1000 MG/100ML infusion (5 mcg/kg/min  92.5 kg Intravenous New Bag/Given 10/29/15 0456)  fentaNYL (SUBLIMAZE) injection 100 mcg (100 mcg Intravenous Given 10/29/15 0437)  midazolam (VERSED) injection 2 mg (2 mg Intravenous  Given 10/29/15 0438)     Initial Impression / Assessment and Plan / ED Course  I have reviewed the triage vital signs and the nursing notes.  Pertinent labs & imaging results that were available during my care of the patient were reviewed by me and considered in my medical decision making (see chart for details).  Clinical Course    4:53 AM Pt seen on arrival Apparently he was at baseline last night, but found this morning in bathroom floor covered in feces and incoherent/confused On arrival, he was very agitated and confused.  No focal weakness He was noted to be febrile Likely sepsis, and urine is potential cause given recent procedure He was given versed 5mg  prior to arrival, and then another ativan 2mg  without  improvement.  Pt still agitated/combative Patient intubated due to altered mental status and also likelihood of needing large doses of sedation to complete workup.  He will need CT head to rule out head injury or ICH Wife updated on plan Patient friend jody stern also updated at wife request  I have spoken to critical care team to have patient admitted 6:03 AM Pt seen by critical care dr Ancil Linsey Admitted to ICU Pt stabilized in the ED Final Clinical Impressions(s) / ED Diagnoses   Final diagnoses:  Sepsis, due to unspecified organism [facility])  Delirium    New Prescriptions New Prescriptions   No medications on file  I personally performed the services described in this documentation, which was scribed in my presence. The recorded information has been reviewed and is accurate.        Ripley Fraise, MD 10/29/15 [id number]

## 2015-10-29 NOTE — Progress Notes (Signed)
Pharmacy Antibiotic Note  [name] is a 60 y.o. male admitted on 10/29/2015 with sepsis.  Pharmacy has been consulted for Vancomycin/Zosyn dosing. WBC 11.7. CrCl ~70 mL/min. Pt febrile at 102.2.   Plan: -Vancomycin 1250 mg IV q12h -Zosyn 3.375G IV q8h to be infused over 4 hours -Trend WBC, temp, renal function  -Drug levels as indicated  Height: 5\' 8"  (172.7 cm) Weight: 204 lb (92.5 kg) IBW/kg (Calculated) : 68.4  Temp (24hrs), Avg:102.2 F (39 C), Min:102.2 F (39 C), Max:102.2 F (39 C)   Recent Labs Lab 10/29/15 0355 10/29/15 0417  WBC 11.7*  --   CREATININE 1.24  --   LATICACIDVEN  --  3.20*    Estimated Creatinine Clearance: 70.8 mL/min (by C-G formula based on SCr of 1.24 mg/dL).    No Known Allergies   Narda Bonds 10/29/2015 5:22 AM

## 2015-10-29 NOTE — Progress Notes (Signed)
Patient transferred to [facility] without complications. 100% oO2

## 2015-10-30 ENCOUNTER — Inpatient Hospital Stay (HOSPITAL_COMMUNITY): Payer: BLUE CROSS/BLUE SHIELD

## 2015-10-30 DIAGNOSIS — J9600 Acute respiratory failure, unspecified whether with hypoxia or hypercapnia: Secondary | ICD-10-CM

## 2015-10-30 DIAGNOSIS — R7989 Other specified abnormal findings of blood chemistry: Secondary | ICD-10-CM

## 2015-10-30 DIAGNOSIS — J9601 Acute respiratory failure with hypoxia: Secondary | ICD-10-CM

## 2015-10-30 DIAGNOSIS — G9340 Encephalopathy, unspecified: Secondary | ICD-10-CM

## 2015-10-30 DIAGNOSIS — I340 Nonrheumatic mitral (valve) insufficiency: Secondary | ICD-10-CM

## 2015-10-30 DIAGNOSIS — I341 Nonrheumatic mitral (valve) prolapse: Secondary | ICD-10-CM

## 2015-10-30 DIAGNOSIS — J9690 Respiratory failure, unspecified, unspecified whether with hypoxia or hypercapnia: Secondary | ICD-10-CM

## 2015-10-30 DIAGNOSIS — R7881 Bacteremia: Secondary | ICD-10-CM

## 2015-10-30 DIAGNOSIS — A419 Sepsis, unspecified organism: Secondary | ICD-10-CM

## 2015-10-30 DIAGNOSIS — R410 Disorientation, unspecified: Secondary | ICD-10-CM

## 2015-10-30 DIAGNOSIS — R778 Other specified abnormalities of plasma proteins: Secondary | ICD-10-CM

## 2015-10-30 LAB — BASIC METABOLIC PANEL
Anion gap: 8 (ref 5–15)
BUN: 25 mg/dL — AB (ref 6–20)
CHLORIDE: 108 mmol/L (ref 101–111)
CO2: 24 mmol/L (ref 22–32)
Calcium: 8.5 mg/dL — ABNORMAL LOW (ref 8.9–10.3)
Creatinine, Ser: 1.24 mg/dL (ref 0.61–1.24)
Glucose, Bld: 147 mg/dL — ABNORMAL HIGH (ref 65–99)
POTASSIUM: 3.5 mmol/L (ref 3.5–5.1)
SODIUM: 140 mmol/L (ref 135–145)

## 2015-10-30 LAB — URINE CULTURE: Culture: NO GROWTH

## 2015-10-30 LAB — BLOOD GAS, ARTERIAL
Acid-Base Excess: 1.5 mmol/L (ref 0.0–2.0)
Bicarbonate: 25 mmol/L (ref 20.0–28.0)
Drawn by: 42624
FIO2: 40
O2 Saturation: 98.5 %
PATIENT TEMPERATURE: 98.6
PCO2 ART: 35.9 mmHg (ref 32.0–48.0)
PEEP: 5 cmH2O
PO2 ART: 119 mmHg — AB (ref 83.0–108.0)
RATE: 15 resp/min
VT: 550 mL
pH, Arterial: 7.457 — ABNORMAL HIGH (ref 7.350–7.450)

## 2015-10-30 LAB — TROPONIN I: TROPONIN I: 0.08 ng/mL — AB (ref <0.03)

## 2015-10-30 LAB — ECHOCARDIOGRAM COMPLETE
HEIGHTINCHES: 68 in
WEIGHTICAEL: 3273.39 [oz_av]

## 2015-10-30 LAB — CBC
HCT: 40.8 % (ref 39.0–52.0)
HEMOGLOBIN: 13 g/dL (ref 13.0–17.0)
MCH: 28.6 pg (ref 26.0–34.0)
MCHC: 31.9 g/dL (ref 30.0–36.0)
MCV: 89.7 fL (ref 78.0–100.0)
PLATELETS: 169 10*3/uL (ref 150–400)
RBC: 4.55 MIL/uL (ref 4.22–5.81)
RDW: 13.6 % (ref 11.5–15.5)
WBC: 21.8 10*3/uL — AB (ref 4.0–10.5)

## 2015-10-30 LAB — GLUCOSE, CAPILLARY
GLUCOSE-CAPILLARY: 122 mg/dL — AB (ref 65–99)
Glucose-Capillary: 141 mg/dL — ABNORMAL HIGH (ref 65–99)
Glucose-Capillary: 142 mg/dL — ABNORMAL HIGH (ref 65–99)
Glucose-Capillary: 118 mg/dL — ABNORMAL HIGH (ref 65–99)
Glucose-Capillary: 138 mg/dL — ABNORMAL HIGH (ref 65–99)

## 2015-10-30 LAB — PHOSPHORUS
PHOSPHORUS: 2.9 mg/dL (ref 2.5–4.6)
Phosphorus: 2.4 mg/dL — ABNORMAL LOW (ref 2.5–4.6)

## 2015-10-30 LAB — MAGNESIUM
MAGNESIUM: 2.6 mg/dL — AB (ref 1.7–2.4)
Magnesium: 2.3 mg/dL (ref 1.7–2.4)

## 2015-10-30 LAB — LACTIC ACID, PLASMA: LACTIC ACID, VENOUS: 1.7 mmol/L (ref 0.5–1.9)

## 2015-10-30 MED ORDER — DEXTROSE 5 % IV SOLN
20.0000 mmol | Freq: Once | INTRAVENOUS | Status: AC
  Administered 2015-10-30: 20 mmol via INTRAVENOUS
  Filled 2015-10-30: qty 6.67

## 2015-10-30 MED ORDER — ACETAMINOPHEN 325 MG PO TABS
650.0000 mg | ORAL_TABLET | Freq: Four times a day (QID) | ORAL | Status: DC | PRN
  Administered 2015-10-30 – 2015-11-06 (×6): 650 mg via ORAL
  Filled 2015-10-30 (×7): qty 2

## 2015-10-30 MED ORDER — POTASSIUM CHLORIDE 20 MEQ/15ML (10%) PO SOLN
20.0000 meq | ORAL | Status: AC
  Administered 2015-10-30 (×2): 20 meq
  Filled 2015-10-30 (×2): qty 15

## 2015-10-30 NOTE — Progress Notes (Signed)
  Echocardiogram 2D Echocardiogram has been performed.  [name] M 10/30/2015, 1:39 PM

## 2015-10-30 NOTE — Consult Note (Signed)
Admit date: 10/29/2015 Referring Physician  Dr. Titus Mould Primary Cardiologist  Dr. Dorris Carnes Reason for Consultation Elevated troponin  HPI: This is a 60 y/o man with a hx of mitral valve prolapse w/ endocarditis (S. gordoneii) and stroke in 2010.  At that time he was noted to have elevated Troponin and underwent cath showing an LAD dissection.  After review with several interventional Cardiologists, it was felt that MI was due to either a dissection or embolic event that proceeded the dissection from his endocarditis.  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  She was recently seen by [name] a few weeks ago and was doing well.    Today he presented to the ED after being found by his wife with altered mental status. In the ED he was found to be febrile and combative and required intubation. Two days prior to his presentation he had a urologic procedure done (details unclear), but it was a trans-urethral and trans-rectal approach to address his prostatic hypertrophy. He was given a dose of cipro to take the afternoon of his procedure, and according to his wife seemed to be well for the day following his procedure. They went to bed the evening prior to his presentation without incident, but his wife found him lying on the bathroom floor, having been incontinent of bowel and bladder. However, he had taken his pants off and was in the bathroom and appeared to have been attempting to intentionally move his bowels and void on the toilet but fell over before he could be seated on the toilet. She found him down, awake, but not lucid.  Cardiology is now asked to see due to elevated troponin at 0.1 and 0.08.  WBC elevated at 21.8.  Cxray showed no evidence of CHF.  He remains intubated.  Blood cx positive for Group B Strep.  No family at bedside at this time.  2D echo pending for workup of possible endocarditis.      PMH:   Past Medical  History:  Diagnosis Date  . CVA (cerebral infarction)    hemmorhagic  CVA  . Dyslipidemia   . Endocarditis    MV Vegitation  . Mitral regurgitation   . Myocardial infarction   . Stroke [facility] [place])      PSH:   Past Surgical History:  Procedure Laterality Date  . NO PAST SURGERIES      Allergies:  Review of patient's allergies indicates no known allergies. Prior to Admit Meds:   Prescriptions Prior to Admission  Medication Sig Dispense Refill Last Dose  . buPROPion (WELLBUTRIN XL) 150 MG 24 hr tablet Take 300 mg by mouth daily.   10/28/2015 at Unknown time  . Cholecalciferol (VITAMIN D PO) Take 1 tablet by mouth daily.   10/28/2015 at Unknown time  . diphenhydramine-acetaminophen (TYLENOL PM) 25-500 MG TABS tablet Take 2 tablets by mouth at bedtime.   10/28/2015 at Unknown time  . Multiple Vitamin (MULTIVITAMIN WITH MINERALS) TABS tablet Take 1 tablet by mouth daily.   10/28/2015 at Unknown time  . rosuvastatin (CRESTOR) 10 MG tablet Take 1 tablet (10 mg total) by mouth daily. 90 tablet 3 10/28/2015 at Unknown time   Fam HX:    Family History  Problem Relation Age of Onset  . Heart disease Mother     mom RF as a child (died of heart problem in  8 )   Social HX:  Social History   Social History  . Marital status: Legally Separated    Spouse name: N/A  . Number of children: N/A  . Years of education: N/A   Occupational History  . Not on file.   Social History Main Topics  . Smoking status: Never Smoker  . Smokeless tobacco: Never Used  . Alcohol use No  . Drug use: No  . Sexual activity: Not on file   Other Topics Concern  . 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 rate 16, height 5\' 8"  (1.727 m),  weight 204 lb 9.4 oz (92.8 kg), SpO2 100 %.    General: Intubated and sedated Lungs:   Clear bilaterally to auscultation anteriorly Heart:   HRRR S1 S2 Pulses are 2+ & equal. No audible murmurs            No carotid bruit. No JVD.  No abdominal bruits. No femoral bruits. Abdomen: Bowel sounds are positive, abdomen soft and non-tender without masses ] Msk:  Back normal, normal gait. Normal strength and tone for age. Extremities:   No clubbing, cyanosis or edema.  DP +1 ?splinter hemorrhage on index finger of left hand and Lesions on toes. Neuro: sedated Psych:  sedated    Labs:   Lab Results  Component Value Date   WBC 21.8 (H) 10/30/2015   HGB 13.0 10/30/2015   HCT 40.8 10/30/2015   MCV 89.7 10/30/2015   PLT 169 10/30/2015    Recent Labs Lab 10/29/15 0355  10/30/15 0446  NA 141  --  140  K 3.7  --  3.5  CL 102  --  108  CO2 26  --  24  BUN 16  --  25*  CREATININE 1.24  < > 1.24  CALCIUM 9.5  --  8.5*  PROT 7.4  --   --   BILITOT 1.1  --   --   ALKPHOS 85  --   --   ALT 35  --   --   AST 35  --   --   GLUCOSE 117*  --  147*  < > = values in this interval not displayed. No results found for: PTT Lab Results  Component Value Date   INR 1.08 10/29/2015   INR 1.1 03/02/2008   INR 1.3 02/20/2008   Lab Results  Component Value Date   CKTOTAL 98 02/19/2008   CKMB 1.8 02/19/2008   TROPONINI 0.08 (HH) 10/29/2015     Lab Results  Component Value Date   CHOL 241 (H) 01/19/2010   CHOL 178 01/13/2009   CHOL  02/17/2008    123        ATP III CLASSIFICATION:  <200     mg/dL   Desirable  200-239  mg/dL   Borderline High  >=240    mg/dL   High          Lab Results  Component Value Date   HDL 56.10 01/19/2010   HDL 74.80 01/13/2009   HDL 27 (L) 02/17/2008   Lab Results  Component Value Date   LDLCALC 94 01/13/2009   [place]  02/17/2008    76  Total Cholesterol/HDL:CHD Risk Coronary Heart Disease Risk Table                     Men   Women  1/2  Average Risk   3.4   3.3  Average Risk       5.0   4.4  2 X Average Risk   9.6   7.1  3 X Average Risk  23.4   11.0        Use the calculated Patient Ratio above and the CHD Risk Table to determine the patient's CHD Risk.        ATP III CLASSIFICATION (LDL):  <100     mg/dL   Optimal  100-129  mg/dL   Near or Above                    Optimal  130-159  mg/dL   Borderline  160-189  mg/dL   High  >190     mg/dL   Very High   Lab Results  Component Value Date   TRIG 112.0 01/19/2010   TRIG 48.0 01/13/2009   TRIG 99 02/17/2008   Lab Results  Component Value Date   CHOLHDL 4 01/19/2010   CHOLHDL 2 01/13/2009   CHOLHDL 4.6 02/17/2008   Lab Results  Component Value Date   LDLDIRECT 164.1 01/19/2010      Radiology:  Ct Head Wo Contrast  Result Date: 10/29/2015 CLINICAL DATA:  60 year old male with confusion. Patient was found bathroom having seizure. EXAM: CT HEAD WITHOUT CONTRAST CT CERVICAL SPINE WITHOUT CONTRAST TECHNIQUE: Multidetector CT imaging of the head and cervical spine was performed following the standard protocol without intravenous contrast. Multiplanar CT image reconstructions of the cervical spine were also generated. COMPARISON:  Head CT dated 02/24/2008 and MRI dated 02/26/2008 FINDINGS: CT HEAD FINDINGS Brain: There is mild age-related atrophy and chronic microvascular ischemic changes. An area of low-attenuation with volume loss involving the left temporal lobe likely represents encephalomalacia sequela of prior infarct. An area of low attenuation seen in the same area on the CT of 2010. There is no acute intracranial hemorrhage. No mass effect or midline shift. No extra-axial fluid collection. Vascular: No hyperdense vessel or unexpected calcification. Skull: Normal. Negative for fracture or focal lesion. Sinuses/Orbits: There is mild diffuse mucoperiosteal thickening of paranasal sinuses. No air-fluid levels. The mastoid air cells are clear. The globes and  retro-orbital fat are preserved. Other: An endotracheal and enteric tubes are partially visualized. CT CERVICAL SPINE FINDINGS Alignment: No acute subluxation. Skull base and vertebrae: No acute fracture. There is incomplete bony fusion of the posterior ring of C1. Soft tissues and spinal canal: No prevertebral fluid or swelling. No visible canal hematoma. An endotracheal and enteric tube are partially visualized. Disc levels: Mild degenerative changes most prominent at C5-C6 and C6-C7. Upper chest: Negative. Other: None IMPRESSION: No acute intracranial hemorrhage. Old-appearing left temporal lobe infarct and encephalomalacia. No acute/traumatic cervical spine pathology. Electronically Signed   By: Anner Crete M.D.   On: 10/29/2015 06:25   Ct Cervical Spine Wo Contrast  Result Date: 10/29/2015 CLINICAL DATA:  60 year old male with confusion. Patient was found bathroom having seizure. EXAM: CT HEAD WITHOUT CONTRAST CT CERVICAL SPINE WITHOUT CONTRAST TECHNIQUE: Multidetector CT imaging of the head and cervical spine was performed following the standard protocol without intravenous contrast. Multiplanar CT image reconstructions of the cervical spine were also generated. COMPARISON:  Head CT dated 02/24/2008 and MRI dated 02/26/2008 FINDINGS: CT  HEAD FINDINGS Brain: There is mild age-related atrophy and chronic microvascular ischemic changes. An area of low-attenuation with volume loss involving the left temporal lobe likely represents encephalomalacia sequela of prior infarct. An area of low attenuation seen in the same area on the CT of 2010. There is no acute intracranial hemorrhage. No mass effect or midline shift. No extra-axial fluid collection. Vascular: No hyperdense vessel or unexpected calcification. Skull: Normal. Negative for fracture or focal lesion. Sinuses/Orbits: There is mild diffuse mucoperiosteal thickening of paranasal sinuses. No air-fluid levels. The mastoid air cells are clear. The  globes and retro-orbital fat are preserved. Other: An endotracheal and enteric tubes are partially visualized. CT CERVICAL SPINE FINDINGS Alignment: No acute subluxation. Skull base and vertebrae: No acute fracture. There is incomplete bony fusion of the posterior ring of C1. Soft tissues and spinal canal: No prevertebral fluid or swelling. No visible canal hematoma. An endotracheal and enteric tube are partially visualized. Disc levels: Mild degenerative changes most prominent at C5-C6 and C6-C7. Upper chest: Negative. Other: None IMPRESSION: No acute intracranial hemorrhage. Old-appearing left temporal lobe infarct and encephalomalacia. No acute/traumatic cervical spine pathology. Electronically Signed   By: Anner Crete M.D.   On: 10/29/2015 06:25   Dg Chest Port 1 View  Result Date: 10/30/2015 CLINICAL DATA:  Respiratory failure EXAM: PORTABLE CHEST 1 VIEW COMPARISON:  Chest radiograph from one day prior. FINDINGS: Endotracheal tube tip is 3.1 cm above the carina. Enteric tube enters stomach with the tip not seen on this image. Stable cardiomediastinal silhouette with mild cardiomegaly. No pneumothorax. No pleural effusion. Slightly low lung volumes. No pulmonary edema. Mild left basilar atelectasis. IMPRESSION: 1. Support structures as described. 2. Slightly low lung volumes with mild left basilar atelectasis. 3. Stable mild cardiomegaly without pulmonary edema. Electronically Signed   By: Ilona Sorrel M.D.   On: 10/30/2015 08:13   Dg Chest Portable 1 View  Result Date: 10/29/2015 CLINICAL DATA:  60 year old male with intubation. EXAM: PORTABLE CHEST 1 VIEW COMPARISON:  Chest radiograph dated 10/29/2015 FINDINGS: There has been interval retraction of the endotracheal tube now approximately 5 cm above the carina. The enteric tube extends into the left hemi abdomen with the side-port in the left upper quadrant and tip beyond the inferior margin of the image. There is stable cardiomegaly with mild  congestive changes. No focal consolidation, pleural effusion, or pneumothorax. A bone fragment noted along the inferior aspect of the left glenoid which appears chronic. No acute fracture identified. IMPRESSION: Interval retraction of the endotracheal tube with tip now approximately 5 cm above the carina. Stable cardiomegaly with minimal congestion. Electronically Signed   By: Anner Crete M.D.   On: 10/29/2015 05:41   Dg Chest Portable 1 View  Result Date: 10/29/2015 CLINICAL DATA:  Initial evaluation for endotracheal tube placement. EXAM: PORTABLE CHEST 1 VIEW COMPARISON:  Prior radiograph from earlier the same day. FINDINGS: Patient now intubated with an endotracheal tube in place. Tip positioned approximately 2.2 cm above the carina. Enteric tube courses in the the abdomen. Cardiomegaly stable.  Mediastinal silhouette within normal limits. Lungs hypoinflated. Mild vascular congestion without overt pulmonary edema. No focal infiltrates. No definite pleural effusion. No pneumothorax. No acute osseous abnormality. IMPRESSION: 1. Tip of the endotracheal tube approximately 2.2 cm above the carina. 2. Stable cardiomegaly with diffuse pulmonary vascular congestion. Electronically Signed   By: Jeannine Boga M.D.   On: 10/29/2015 05:33   Dg Chest Port 1 View  Result Date: 10/29/2015 CLINICAL DATA:  60 year old  male with altered mental status EXAM: PORTABLE CHEST 1 VIEW COMPARISON:  Chest radiograph dated 03/06/2008 FINDINGS: There is moderate cardiomegaly with mild vascular congestion. No focal consolidation, pleural effusion, or pneumothorax. No acute osseous pathology noted. IMPRESSION: Cardiomegaly with mild congestive changes, new since the prior radiograph. Electronically Signed   By: Anner Crete M.D.   On: 10/29/2015 04:27    EKG:  Sinus tachycardia with IRBBB and LVH, ST depression in the lateral precordial leads  ASSESSMENT/PLAN:   1.  Elevated troponin likely demand ischemia in  the setting of sepsis. 2D echo pending.  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  Will continue to cycle enzymes.  No ST elevation on EKG but did have some ST depression on EKG when tachycardic.    2.  Group B strep bacteremia ? Etiology.  Had urologic procedure 2 days ago and was covered with antibiotics.  I do not hear a murmur on exam but ? Splinter hemorrhage on index finger of left hand which could be trauma related.  2D echo pending.  May need TEE.  Antbx per CCM.   3.  Bileaflet MVP with moderate MR by last echo.    4.  Remote MI in the setting of acute endocarditis in 2010 felt likely due to embolic event and subsequent mid LAD dissection.    5.  History of CVA post cath felt embolic from endocarditis.   6.  Dyslipidemia - on statin.    Fransico Him, MD  10/30/2015  2:40 PM

## 2015-10-30 NOTE — Progress Notes (Signed)
PULMONARY / CRITICAL CARE MEDICINE   Name: Tzuriel Boehnke MRN: [id number] DOB: Mar 28, 1955    ADMISSION DATE:  10/29/2015  CHIEF COMPLAINT:  AMS  HISTORY OF PRESENT ILLNESS:   Mr. Buchanan is a 60 y/o man with a hx of mitral valve prolapse w/ endocarditis and stroke in 2012 who presented to the ED after being found by his wife altered. In the ED he was found to be febrile and combative and required intubation. Two days prior to his presentation he had a urologic procedure done (details unclear), but it was a trans-urethral and trans-rectal approach to address his prostatic hypertrophy. He was given a dose of cipro to take the afternoon of his procedure, and according to his wife seemed to be well for the day following his procedure. They went to bed the evening prior to his presentation without incident, but his wife found him lying on the bathroom floor, having been incontinent of bowel and bladder. However, he had taken his pants off and was in the bathroom and appeared to have been attempting to intentionally move his bowels and void on the toilet but fell over before he could be seated on the toilet. She found him down, awake, but not lucid.  SUBJECTIVE:  Agitated off sedation per nursing , no purposeful movement Appreciate Neuro consult 10/28 , acute encephalopathy due to sepsis , cont to monitor . MRI if sx persist or worsen.  BC panel +strep , fevers persist~102 tmax , echo pending , LA tr down   VITAL SIGNS: BP 125/61   Pulse 88   Temp 100.1 F (37.8 C) (Axillary)   Resp 15   Ht 5\' 8"  (1.727 m)   Wt 204 lb 9.4 oz (92.8 kg)   SpO2 100%   BMI 31.11 kg/m   HEMODYNAMICS:    VENTILATOR SETTINGS: Vent Mode: PRVC FiO2 (%):  [40 %-50 %] 40 % Set Rate:  [15 bmp] 15 bmp Vt Set:  [550 mL] 550 mL PEEP:  [5 cmH20] 5 cmH20 Plateau Pressure:  [15 cmH20-16 cmH20] 15 cmH20  INTAKE / OUTPUT: I/O last 3 completed shifts: In: 8142.4 [I.V.:4002.4; NG/GT:190; IV Piggyback:3950] Out: [id number]  [Urine:2500; Other:50]  PHYSICAL EXAMINATION: General:  Well-appearing man, sedated and intubated Neuro:  Sedated, intubated, moves all ext to pain but not command, pupils are reactive. HEENT: ETT in place, neck supple. Cardiovascular:  RRR, Nl S1/S2, -M/R/G. Lungs:  CTA B. Abdomen:  Soft, NT, ND and +BS. Musculoskeletal:  No apparent injuries to extremities Skin:  No rashes  LABS:  BMET  Recent Labs Lab 10/29/15 0355 10/29/15 0845 10/30/15 0446  NA 141  --  140  K 3.7  --  3.5  CL 102  --  108  CO2 26  --  24  BUN 16  --  25*  CREATININE 1.24 1.19 1.24  GLUCOSE 117*  --  147*   Electrolytes  Recent Labs Lab 10/29/15 0355 10/29/15 1623 10/30/15 0446  CALCIUM 9.5  --  8.5*  MG  --  1.4* 2.6*  PHOS  --  3.0 2.4*   CBC  Recent Labs Lab 10/29/15 0355 10/29/15 0845 10/30/15 0446  WBC 11.7* 24.4* 21.8*  HGB 16.6 13.6 13.0  HCT 48.7 41.3 40.8  PLT 210 182 169   Coag's  Recent Labs Lab 10/29/15 0355  APTT 25  INR 1.08   Sepsis Markers  Recent Labs Lab 10/29/15 0417 10/29/15 1127 10/30/15 0446  LATICACIDVEN 3.20* 3.0* 1.7   ABG  Recent Labs  Lab 10/29/15 0451 10/30/15 0510  PHART 7.381 7.457*  PCO2ART 40.2 35.9  PO2ART 84.0 119*   Liver Enzymes  Recent Labs Lab 10/29/15 0355  AST 35  ALT 35  ALKPHOS 85  BILITOT 1.1  ALBUMIN 4.6   Cardiac Enzymes  Recent Labs Lab 10/29/15 1117 10/29/15 1644 10/29/15 2331  TROPONINI 0.10* 0.10* 0.08*   Glucose  Recent Labs Lab 10/29/15 0847 10/29/15 1128 10/29/15 1626 10/30/15 0030 10/30/15 0733  GLUCAP 136* 129*  129* 133* 122* 142*   Imaging No results found. STUDIES:  Head CT >> negative Neck CT >> negative  CULTURES: Blood 10/28 >> Panel +Strep species/agalactiae >> RVP neg 10/28   ANTIBIOTICS: Vanc  10/28 >> Zosyn 10/28 >>  SIGNIFICANT EVENTS: 10/28>>>Intubated in the ED for agitation  LINES/TUBES: ETT 7.5 mm PIV x2 Condom cath OGT  DISCUSSION: 60 y/o p/w  sepsis  ASSESSMENT / PLAN:  PULMONARY A: Need for mechanical ventilation P:   Full vent support. Assess for daily SBT and wean    CARDIOVASCULAR A:  HLD Hx of MV endocarditis w/ MI /CVA 2010 Troponin bump .05>.10>.10>.08 ? Demand ischemia w/ sepsis  P:  Continue Crestor. Check EKG  Echo pending  Consult Cardiology Dorris Carnes pt)    RENAL A:   Lactic acidosis>LA trend down  Hypomagnesium >replaced  Hypokalemia >replaced   P:   Due to sepsis,, tr LA  BMET in AM Replace electrolytes as indicated. NS KVO   GASTROINTESTINAL A:  Nutrition  P:   Cont TF PPI  HEMATOLOGIC A:   No active issues P:  CBC in AM. Transfuse per ICU protocol.  INFECTIOUS A:   Severe sepsis w/ bacteremia ? Urological procedure on 10/25 [facility] panel +strep , RVP neg  Hx of endocarditis w/ CVA/MI in 2010  P:   Echo to eval mitral valve, pending. F/u blood cx. Continue vanc+zosyn.  ENDOCRINE A:   No active issues   P:   Monitor  NEUROLOGIC A:   AMS most likely d/t sepsis   Hx of stroke Neck is supple, doubt meningitis -neuro consult 10/28 , MRI if mentation does not improve or worsens P:   RASS goal: 0 Sedation protocol   FAMILY  - Updates: Wife updated 10/29  - Inter-disciplinary family meet or Palliative Care meeting due by:  11/05/15  Tammy Parrett NP-C  [facility]  [phone]   10/30/2015, 7:37 AM  Attending Note:  60 year old male with history of endocarditis and associated CVA from [facility] presenting with AMS.  The patient had a recent urological procedure.  Patient growing group B strep in blood and coag negative staph.  MS remains poor but neurology feels it is sepsis related.  On exam, lungs with crackles and purulent sputum when suctioned.  I reviewed CXR myself, ETT ok and no infiltrate.  Spoke with wife, I feel patient will improve, it is just a matter of treating the sepsis and giving enough time to heal.  Will continue abx until  speciation and sensitivities are present.  Troponin positive, will call cards for that but more importantly the echo for ?of vegetations.  Order sputum cultures.  Hold weaning for today (did fine on bedside PS on 5/5 by me).  Once mental status improves then will extubate (pulmonary mechanics are stable).  Wife updated bedside.  The patient is critically ill with multiple organ systems failure and requires high complexity decision making for assessment and support, frequent evaluation and titration of therapies,  application of advanced monitoring technologies and extensive interpretation of multiple databases.   Critical Care Time devoted to patient care services described in this note is  35  Minutes. This time reflects time of care of this signee Dr Jennet Maduro. This critical care time does not reflect procedure time, or teaching time or supervisory time of PA/NP/Med student/Med Resident etc but could involve care discussion time.  Rush Farmer, M.D. [facility] Pulmonary/Critical Care Medicine. Pager: [phone]. After hours pager: [phone].

## 2015-10-30 NOTE — Progress Notes (Signed)
Neurology Progress Note  Subjective: D/w RN. Chart reviewed. No significant overnight events. When RN attempted to turn down sedation this morning, he became agitated and was thrashing around on the bed so she turned it back up. He remains on propofol 40 mcg/kg/min.   Current Meds:   Current Facility-Administered Medications:  .  0.9 %  sodium chloride infusion, 250 mL, Intravenous, PRN, Luz Brazen, MD, Last Rate: 20 mL/hr at 10/29/15 2000, 250 mL at 10/29/15 2000 .  chlorhexidine gluconate (MEDLINE KIT) (PERIDEX) 0.12 % solution 15 mL, 15 mL, Mouth Rinse, BID, Raylene Miyamoto, MD, 15 mL at 10/29/15 2001 .  etomidate (AMIDATE) injection, , Intravenous, PRN, Ripley Fraise, MD, 150 mg at 10/29/15 0434 .  feeding supplement (PRO-STAT SUGAR FREE 64) liquid 60 mL, 60 mL, Per Tube, QID, Raylene Miyamoto, MD, 60 mL at 10/29/15 2149 .  feeding supplement (VITAL HIGH PROTEIN) liquid 1,000 mL, 1,000 mL, Per Tube, Q24H, Raylene Miyamoto, MD, 1,000 mL at 10/29/15 1600 .  MEDLINE mouth rinse, 15 mL, Mouth Rinse, 10 times per day, Raylene Miyamoto, MD, 15 mL at 10/30/15 0532 .  pantoprazole (PROTONIX) EC tablet 40 mg, 40 mg, Oral, Q1200, Tammy S Parrett, NP, 40 mg at 10/29/15 1215 .  piperacillin-tazobactam (ZOSYN) IVPB 3.375 g, 3.375 g, Intravenous, Q8H, Erenest Blank, RPH, 3.375 g at 10/30/15 0532 .  potassium chloride 20 MEQ/15ML (10%) solution 20 mEq, 20 mEq, Per Tube, Q4H, Guadelupe Sabin Deterding, MD .  propofol (DIPRIVAN) 1000 MG/100ML infusion, 5-80 mcg/kg/min, Intravenous, Continuous, Luz Brazen, MD, Last Rate: 22.2 mL/hr at 10/30/15 0700, 40 mcg/kg/min at 10/30/15 0700 .  rosuvastatin (CRESTOR) tablet 10 mg, 10 mg, Oral, Daily, Luz Brazen, MD, 10 mg at 10/29/15 0959 .  succinylcholine (ANECTINE) injection, , Intravenous, PRN, Ripley Fraise, MD, 100 mg at 10/29/15 0435 .  vancomycin (VANCOCIN) 1,250 mg in sodium chloride 0.9 % 250 mL IVPB, 1,250 mg, Intravenous, Q12H, Erenest Blank, RPH, 1,250 mg at 10/30/15 0500  Objective:  Temp:  [99.1 F (37.3 C)-101 F (38.3 C)] 100.1 F (37.8 C) (10/29 0400) Pulse Rate:  [88-107] 88 (10/29 0700) Resp:  [15-26] 15 (10/29 0700) BP: (95-129)/(52-81) 125/61 (10/29 0700) SpO2:  [98 %-100 %] 100 % (10/29 0700) FiO2 (%):  [40 %-50 %] 40 % (10/29 0459) Weight:  [92.8 kg (204 lb 9.4 oz)] 92.8 kg (204 lb 9.4 oz) (10/29 0500)  General: WDWN intubated and sedated on propofol drip. He does not open his eyes spontaneously or with stimulation. He is not following any commands. He is unresponsive to voice but has some mild agitated movements with noxious stimulation.   HEENT: Neck is supple without lymphadenopathy. ETT in place. Sclerae are anicteric. There is mild conjunctival injection.  CV: Regular, no murmur. Carotid pulses are 2+ and symmetric with no bruits. Distal pulses 2+ and symmetric.  Lungs: CTAB on anterior exam.   Extremities: No C/C/E. Neuro: MS: As noted above. No aphasia.  CN: Pupils are equal and reactive from 3-->2 mm bilaterally. He is not blinking to threat. Eyes are conjugate. Oculocephalics are sluggish but intact. Corneals are present and symmetric. His face is grossly symmetric but partly obscured by tubes and tape. The remainder of his cranial nerves cannot be assessed as he is unable to participate with the exam.  Motor: Normal bulk. Tone is mildly increased throughout, L>R. He moves all extremities when I start to stimulate him but still with less movement on the left, particularly the left  leg. No tremor or other abnormal movements are observed.  Sensation: He moves all extremities to mild noxious stimuli anywhere.  DTRs: 3+, symmetric. Toes are downgoing on the right, mute on the left. Coordination and gait: These cannot be assessed as the patient is unable to participate with the exam.   Labs: Lab Results  Component Value Date   WBC 21.8 (H) 10/30/2015   HGB 13.0 10/30/2015   HCT 40.8 10/30/2015    PLT 169 10/30/2015   GLUCOSE 147 (H) 10/30/2015   CHOL 241 (H) 01/19/2010   TRIG 112.0 01/19/2010   HDL 56.10 01/19/2010   LDLDIRECT 164.1 01/19/2010   LDLCALC 94 01/13/2009   ALT 35 10/29/2015   AST 35 10/29/2015   NA 140 10/30/2015   K 3.5 10/30/2015   CL 108 10/30/2015   CREATININE 1.24 10/30/2015   BUN 25 (H) 10/30/2015   CO2 24 10/30/2015   INR 1.08 10/29/2015   HGBA1C  03/09/2008    5.6 (NOTE)   The ADA recommends the following therapeutic goal for glycemic   control related to Hgb A1C measurement:   Goal of Therapy:   < 7.0% Hgb A1C   Reference: American Diabetes Association: Clinical Practice   Recommendations 2008, Diabetes Care,  2008, 31:(Suppl 1).   CBC Latest Ref Rng & Units 10/30/2015 10/29/2015 10/29/2015  WBC 4.0 - 10.5 K/uL 21.8(H) 24.4(H) 11.7(H)  Hemoglobin 13.0 - 17.0 g/dL 13.0 13.6 16.6  Hematocrit 39.0 - 52.0 % 40.8 41.3 48.7  Platelets 150 - 400 K/uL 169 182 210    Lab Results  Component Value Date   HGBA1C  03/09/2008    5.6 (NOTE)   The ADA recommends the following therapeutic goal for glycemic   control related to Hgb A1C measurement:   Goal of Therapy:   < 7.0% Hgb A1C   Reference: American Diabetes Association: Clinical Practice   Recommendations 2008, Diabetes Care,  2008, 31:(Suppl 1).   Lab Results  Component Value Date   ALT 35 10/29/2015   AST 35 10/29/2015   ALKPHOS 85 10/29/2015   BILITOT 1.1 10/29/2015   Blood cx--> Strep. Agalactiae Mg 2.6 Phos 2.4 Lactate 1.4 ABG: pH 7.457, pCO2 35.9, pO2 119, bicarb 25  Radiology:  No new neuroimaging   A/P:   1. Acute encephalopathy: This is in the setting of sepsis. There is no clinical evidence to suggest CNS infection at this time. He is not seizing on his current exam. Seizure prior to admission is possible but if so would be considered provoked in the setting of infection. Suspect septic encephalopathy. Treat underlying infection as you are. Optimize metabolic status as you are. Limit  sedation as tolerated. If his encephalopathy persists following adequate treatment of his infection, consideration could be given to MRI scan of the brain at that time. Will order EEG for AM 10/30.  2. 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 Additional risk factors for cerebrovascular disease including history of dyslipidemia. Continue statin. Given mechanism for previous stroke, no indication for antiplatelet therapy unless other indications exist.  3. Possible left hemiparesis: This may be recrudescence of old deficits in the setting of sepsis given history of right hemispheric hemorrhagic infarction. This can be observed. Follow exam as sedation is reduced. If persists, may consider MRI at a later date.  No family present at the time of my visit. Plan d/w RN at the bedside.   Melba Coon, MD [facility]

## 2015-10-31 ENCOUNTER — Inpatient Hospital Stay (HOSPITAL_COMMUNITY): Payer: BLUE CROSS/BLUE SHIELD

## 2015-10-31 DIAGNOSIS — R7881 Bacteremia: Secondary | ICD-10-CM

## 2015-10-31 DIAGNOSIS — R4182 Altered mental status, unspecified: Secondary | ICD-10-CM

## 2015-10-31 DIAGNOSIS — I351 Nonrheumatic aortic (valve) insufficiency: Secondary | ICD-10-CM

## 2015-10-31 LAB — BLOOD GAS, ARTERIAL
ACID-BASE EXCESS: 1.5 mmol/L (ref 0.0–2.0)
BICARBONATE: 25.3 mmol/L (ref 20.0–28.0)
DRAWN BY: 42624
FIO2: 0.4
LHR: 15 {breaths}/min
MECHVT: 500 mL
O2 SAT: 98.6 %
PEEP/CPAP: 5 cmH2O
PH ART: 7.437 (ref 7.350–7.450)
PO2 ART: 131 mmHg — AB (ref 83.0–108.0)
Patient temperature: 98.6
pCO2 arterial: 38.1 mmHg (ref 32.0–48.0)

## 2015-10-31 LAB — CBC
HEMATOCRIT: 38.9 % — AB (ref 39.0–52.0)
HEMOGLOBIN: 12.5 g/dL — AB (ref 13.0–17.0)
MCH: 29.3 pg (ref 26.0–34.0)
MCHC: 32.1 g/dL (ref 30.0–36.0)
MCV: 91.3 fL (ref 78.0–100.0)
Platelets: 180 10*3/uL (ref 150–400)
RBC: 4.26 MIL/uL (ref 4.22–5.81)
RDW: 13.7 % (ref 11.5–15.5)
WBC: 16.4 10*3/uL — ABNORMAL HIGH (ref 4.0–10.5)

## 2015-10-31 LAB — GLUCOSE, CAPILLARY
GLUCOSE-CAPILLARY: 142 mg/dL — AB (ref 65–99)
GLUCOSE-CAPILLARY: 115 mg/dL — AB (ref 65–99)
Glucose-Capillary: 106 mg/dL — ABNORMAL HIGH (ref 65–99)
Glucose-Capillary: 120 mg/dL — ABNORMAL HIGH (ref 65–99)
Glucose-Capillary: 125 mg/dL — ABNORMAL HIGH (ref 65–99)
Glucose-Capillary: 127 mg/dL — ABNORMAL HIGH (ref 65–99)
Glucose-Capillary: 148 mg/dL — ABNORMAL HIGH (ref 65–99)

## 2015-10-31 LAB — CULTURE, BLOOD (ROUTINE X 2)

## 2015-10-31 LAB — MAGNESIUM: Magnesium: 2.3 mg/dL (ref 1.7–2.4)

## 2015-10-31 LAB — PHOSPHORUS: Phosphorus: 2.7 mg/dL (ref 2.5–4.6)

## 2015-10-31 LAB — BASIC METABOLIC PANEL
ANION GAP: 7 (ref 5–15)
BUN: 25 mg/dL — ABNORMAL HIGH (ref 6–20)
CHLORIDE: 110 mmol/L (ref 101–111)
CO2: 25 mmol/L (ref 22–32)
Calcium: 8.4 mg/dL — ABNORMAL LOW (ref 8.9–10.3)
Creatinine, Ser: 1.07 mg/dL (ref 0.61–1.24)
GFR calc Af Amer: >60 mL/min (ref >60)
GFR calc non Af Amer: >60 mL/min (ref >60)
GLUCOSE: 123 mg/dL — AB (ref 65–99)
POTASSIUM: 3.5 mmol/L (ref 3.5–5.1)
Sodium: 142 mmol/L (ref 135–145)

## 2015-10-31 LAB — TRIGLYCERIDES: Triglycerides: 286 mg/dL — ABNORMAL HIGH (ref <150)

## 2015-10-31 MED ORDER — PANTOPRAZOLE SODIUM 40 MG PO PACK
40.0000 mg | PACK | Freq: Every day | ORAL | Status: DC
  Administered 2015-10-31 – 2015-11-01 (×2): 40 mg
  Filled 2015-10-31 (×2): qty 20

## 2015-10-31 MED ORDER — PENICILLIN G POTASSIUM 5000000 UNITS IJ SOLR
4.0000 10*6.[IU] | INTRAVENOUS | Status: DC
  Administered 2015-10-31 – 2015-11-08 (×47): 4 10*6.[IU] via INTRAVENOUS
  Filled 2015-10-31 (×60): qty 4

## 2015-10-31 MED ORDER — IOPAMIDOL (ISOVUE-300) INJECTION 61%
INTRAVENOUS | Status: AC
  Administered 2015-10-31: 30 mL
  Filled 2015-10-31: qty 30

## 2015-10-31 MED ORDER — DEXMEDETOMIDINE HCL IN NACL 400 MCG/100ML IV SOLN
0.4000 ug/kg/h | INTRAVENOUS | Status: DC
  Administered 2015-10-31 (×2): 1 ug/kg/h via INTRAVENOUS
  Administered 2015-10-31: 0.4 ug/kg/h via INTRAVENOUS
  Administered 2015-10-31: 1.1 ug/kg/h via INTRAVENOUS
  Administered 2015-11-01 (×2): 1.2 ug/kg/h via INTRAVENOUS
  Administered 2015-11-02: 0.3 ug/kg/h via INTRAVENOUS
  Administered 2015-11-03: 0.6 ug/kg/h via INTRAVENOUS
  Filled 2015-10-31: qty 50
  Filled 2015-10-31 (×3): qty 100
  Filled 2015-10-31: qty 50
  Filled 2015-10-31: qty 100
  Filled 2015-10-31: qty 50
  Filled 2015-10-31 (×3): qty 100

## 2015-10-31 MED ORDER — SODIUM CHLORIDE 0.9 % IV SOLN: INTRAVENOUS | Status: DC

## 2015-10-31 MED ORDER — POTASSIUM CHLORIDE 20 MEQ/15ML (10%) PO SOLN
40.0000 meq | Freq: Once | ORAL | Status: AC
  Administered 2015-10-31: 40 meq
  Filled 2015-10-31: qty 30

## 2015-10-31 MED ORDER — IOPAMIDOL (ISOVUE-300) INJECTION 61%
INTRAVENOUS | Status: AC
  Administered 2015-11-01: 100 mL
  Filled 2015-10-31: qty 100

## 2015-10-31 NOTE — Progress Notes (Signed)
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 In the ED he was found to be febrile and combative and required intubation. Two days prior to his presentation he had a urologic procedure done (details unclear), but it was a trans-urethral and trans-rectal approach to address his prostatic hypertrophy. He was given a dose of cipro to take the afternoon of his procedure, and according to his wife seemed to be well for the day following his procedure. They went to bed the evening prior to his presentation without incident, but his wife found him lying on the bathroom floor, having been incontinent of bowel and bladder. However, he had taken his pants off and was in the bathroom and appeared to have been attempting to intentionally move his bowels and void on the toilet but fell over before he could be seated on the toilet. She found him down, awake, but not lucid.  SUBJECTIVE:  Agitation when sedation is weaned down.  VITAL SIGNS: BP (!) 162/84 (BP Location: Left Arm)   Pulse 83   Temp 99.8 F (37.7 C) (Axillary)   Resp 15   Ht 5\' 8"  (1.727 m)   Wt 201 lb 11.5 oz (91.5 kg)   SpO2 100%   BMI 30.67 kg/m   HEMODYNAMICS:   VENTILATOR SETTINGS: Vent Mode: PRVC FiO2 (%):  [40 %] 40 % Set Rate:  [15 bmp] 15 bmp Vt Set:  [550 mL] 550 mL PEEP:  [5 cmH20] 5 cmH20 Plateau Pressure:  [12 cmH20-16 cmH20] 12 cmH20  INTAKE / OUTPUT: I/O last 3 completed shifts: In: 2484.7 [I.V.:1028.1; NG/GT:150; IV Piggyback:1306.7] Out: 3450 [Urine:3450]  PHYSICAL EXAMINATION: General:  Well-appearing man, sedated and intubated Neuro:  Sedated, intubated, moves all ext to pain but not command, pupils are reactive. HEENT:  ETT in place, neck supple, Moist mucus membranes Cardiovascular:  RRR, Nl S1/S2, No MRG Lungs:  Clear, no wheeze or crackles Abdomen:  Soft, NT, ND and +BS. Musculoskeletal:  No apparent injuries to extremities Skin:  No rashes  LABS:  BMET  Recent Labs Lab 10/29/15 0355 10/29/15 0845 10/30/15 0446 10/31/15 0111  NA 141  --  140 142  K 3.7  --  3.5 3.5  CL 102  --  108 110  CO2 26  --  24 25  BUN 16  --  25* 25*  CREATININE 1.24 1.19 1.24 1.07  GLUCOSE 117*  --  147* 123*   Electrolytes  Recent Labs Lab 10/29/15 0355  10/30/15 0446 10/30/15 1712 10/31/15 0111  CALCIUM 9.5  --  8.5*  --  8.4*  MG  --   < > 2.6* 2.3 2.3  PHOS  --   < > 2.4* 2.9 2.7  < > = values in this interval not displayed. CBC  Recent Labs Lab 10/29/15 0845 10/30/15 0446 10/31/15 0111  WBC 24.4* 21.8* 16.4*  HGB 13.6 13.0 12.5*  HCT 41.3 40.8 38.9*  PLT 182 169 180   Coag's  Recent Labs Lab 10/29/15 0355  APTT 25  INR 1.08   Sepsis Markers  Recent Labs Lab 10/29/15 0417 10/29/15 1127 10/30/15 0446  LATICACIDVEN 3.20* 3.0* 1.7   ABG  Recent  Labs Lab 10/29/15 0451 10/30/15 0510 10/31/15 0435  PHART 7.381 7.457* 7.437  PCO2ART 40.2 35.9 38.1  PO2ART 84.0 119* 131*   Liver Enzymes  Recent Labs Lab 10/29/15 0355  AST 35  ALT 35  ALKPHOS 85  BILITOT 1.1  ALBUMIN 4.6   Cardiac Enzymes  Recent Labs Lab 10/29/15 1117 10/29/15 1644 10/29/15 2331  TROPONINI 0.10* 0.10* 0.08*   Glucose  Recent Labs Lab 10/30/15 0733 10/30/15 1128 10/30/15 1524 10/30/15 2012 10/30/15 2357 10/31/15 0724  GLUCAP 142* 118* 138* 142* 127* 120*   Imaging Dg Chest Port 1 View  Result Date: 10/31/2015 CLINICAL DATA:  Respiratory failure, sepsis, mitral insufficiency. Intubated patient. EXAM: PORTABLE CHEST 1 VIEW COMPARISON:  Portable chest x-ray of October 30, 2015 FINDINGS: The patient is rotated toward the right. The lungs are reasonably well inflated. Mild interstitial  prominence in the right lower lobe persists. There is no pneumothorax or pleural effusion. The heart is top-normal in size. The pulmonary vascularity is normal. The endotracheal tube tip lies approximately 6 cm above the carina. The esophagogastric tube tip in proximal port lie below the GE junction. IMPRESSION: Stable appearance of the chest allowing for rotation. Subsegmental atelectasis in the left lower lobe persists. The support tubes are in reasonable position. Electronically Signed   By: David  Martinique M.D.   On: 10/31/2015 07:04   STUDIES:  Head CT >> negative Neck CT >> negative  CULTURES: Blood 10/28 >> Panel +Strep species/agalactiae >> RVP neg 10/28   ANTIBIOTICS: Vanc  10/28 >> Zosyn 10/28 >>  SIGNIFICANT EVENTS: 10/28>>>Intubated in the ED for agitation  LINES/TUBES: ETT 7.5 mm PIV x2 Condom cath OGT  DISCUSSION: 60 y/o p/w sepsis  ASSESSMENT / PLAN:  PULMONARY A: Need for mechanical ventilation P:   Full vent support. Assess for daily SBT and wean    CARDIOVASCULAR A:  HLD Hx of MV endocarditis w/ MI /CVA 2010 Troponin bump .05>.10>.10>.08 ? Demand ischemia w/ sepsis  P:  Continue Crestor. Check EKG  Echo pending  Will need TEE  RENAL A:   Lactic acidosis>LA trend down  Hypomagnesium >replaced  Hypokalemia >replaced   P:   Due to sepsis BMET in AM Replace electrolytes as indicated. NS KVO   GASTROINTESTINAL A:  Nutrition  P:   Cont TF PPI  HEMATOLOGIC A:   No active issues P:  CBC in AM. Transfuse per ICU protocol.  INFECTIOUS A:   Severe sepsis w/ strep bacteremia ? Urological procedure on 10/25 [facility] [address] panel +strep , RVP neg  Hx of endocarditis w/ CVA/MI in 2010  Possible vegetations on TTE 10/29 P:   Will get a TEE Repeat blood cultures.  Continue vanc+zosyn. Consider ID consult.   ENDOCRINE A:   No active issues   P:   Monitor  NEUROLOGIC A:   AMS most likely d/t sepsis   Hx of stroke Neck is supple, doubt  meningitis -neuro consult 10/28 , MRI if mentation does not improve or worsens P:   RASS goal: 0 Sedation protocol   FAMILY  - Updates: Wife updated 10/29, 10/30 - Inter-disciplinary family meet or Palliative Care meeting due by:  11/05/15  Critical care time- 35 mins.  Marshell Garfinkel MD [facility] Pager [phone] If no answer or after 3pm call: [phone] 10/31/2015, 8:24 AM

## 2015-10-31 NOTE — Procedures (Signed)
ELECTROENCEPHALOGRAM REPORT  Date of Study: 10/31/2015  Patient's Name: [name] MRN: [id number] Date of Birth: [birth year]  Referring Provider: Dr. Melba Coon  Clinical History: This is a 60 year old man with altered mental status.  Medications: propofol (DIPRIVAN) 1000 MG/100ML infusion  acetaminophen (TYLENOL) tablet 650 mg  chlorhexidine gluconate (MEDLINE KIT) (PERIDEX) 0.12 % solution 15 mL  etomidate (AMIDATE) injection  pantoprazole sodium (PROTONIX) 40 mg/20 mL oral suspension 40 mg  piperacillin-tazobactam (ZOSYN) IVPB 3.375 g  rosuvastatin (CRESTOR) tablet 10 mg   Technical Summary: A multichannel digital EEG recording measured by the international 10-20 system with electrodes applied with paste and impedances below 5000 ohms performed as portable with EKG monitoring in an intubated and sedated patient.  Hyperventilation and photic stimulation were not performed.  The digital EEG was referentially recorded, reformatted, and digitally filtered in a variety of bipolar and referential montages for optimal display.   Description: The patient is intubated and sedated on Propofol during the recording.  Thre is no clear posterior dominant rhythm. The background consists of a mixture of low to medium voltage beta, theta, and delta activity. There were poorly formed vertex waves seen. With stimulation, there is minimal reactivity noted, with some muscle artifact. Hyperventilation and photic stimulation were not performed.  There were no epileptiform discharges or electrographic seizures seen.    EKG lead showed sinus tachycardia. Impression: This sedated EEG is abnormal due to moderate diffuse slowing of the background.  Clinical Correlation of the above findings indicates diffuse cerebral dysfunction that is non-specific in etiology and can be seen with hypoxic/ischemic injury, toxic/metabolic encephalopathies, neurodegenerative disorders, or medication effect from Propofol.   The absence of epileptiform discharges does not rule out a clinical diagnosis of epilepsy.  Clinical correlation is advised.   Ellouise Newer, M.D.

## 2015-10-31 NOTE — Consult Note (Signed)
[facility] [facility] for Infectious Disease  Total days of antibiotics 3        Day 3 Zosyn               Reason for Consult: Group B Strep Bacteremia   Referring Physician: Dr. Vaughan Browner  Active Problems:   Sepsis ([place])   Delirium   Respiratory failure ([place])   Acute encephalopathy   Elevated troponin   MVP (mitral valve prolapse)   Mitral valve insufficiency   Bacteremia    HPI: [name] is a 60 y.o. 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 Two days prior to admission, patient underwent a Urologic procedure (transuretheral and transrectal) for evaluation and treatment of his BPH. The details of this procedure are not known. In conjunction with the procedure, patient took a prophylactic dose of Ciprofloxacin. Patient did well after the procedure and the following day, but was found by his wife on the bathroom floor the night following. He had his pants off but appeared to have fallen on the floor prior to sitting on the toilet.   In the ED, patient was febrile to 102.2, hypertensive, tachycardic and satting 100% on non-rebreather. WBC has trended 11.7 > 24.4 > 21.8 >16.4 with left shift. CT head and neck showed no acute hemorrhage and an old left temporal lobe infarct with encephalomalacia. CXR showed pulmonary vascular congestion. BCx from 10/28 grew GBS (Strep Agalactiae). Respiratory Cx grew rare candida. UCx is NGTD. TTE showed a questionable density on MV, but TEE was negative for vegetations. Repeat BCx are pending. On presentation, patient was started on Vanc/Zosyn for sepsis and intubated due to agitation and airway compromise. Since admission, patient has been persistently febrile and unable to follow commands.   Past Medical History:  Diagnosis Date  . CVA (cerebral infarction)    hemmorhagic  CVA  .  Dyslipidemia   . Endocarditis    MV Vegitation  . Mitral regurgitation   . Myocardial infarction   . Stroke [facility])     Allergies: No Known Allergies  Current antibiotics: Vancomycin 10/28 >> 10/30 Zosyn 10/28 >>  MEDICATIONS: . chlorhexidine gluconate (MEDLINE KIT)  15 mL Mouth Rinse BID  . feeding supplement (PRO-STAT SUGAR FREE 64)  60 mL Per Tube QID  . feeding supplement (VITAL HIGH PROTEIN)  1,000 mL Per Tube Q24H  . mouth rinse  15 mL Mouth Rinse 10 times per day  . pantoprazole sodium  40 mg Per Tube Daily  . pencillin G potassium IV  4 Million Units Intravenous Q4H  . rosuvastatin  10 mg Oral Daily    Social History  Substance Use Topics  . Smoking status: Never Smoker  . Smokeless tobacco: Never Used  . Alcohol use No    Family History  Problem Relation Age of Onset  . Heart disease Mother     mom RF as a child (died of heart problem in  55 )    Review of Systems: A complete ROS was negative except as per HPI.   OBJECTIVE: Vitals:   10/31/15 1414 10/31/15 1500 10/31/15 1600 10/31/15 1700  BP:  (!) 157/81 (!) 165/89 123/67  Pulse: 97     Resp: 19     Temp: (!) 101.6 F (38.7 C)  (!) 101.2 F (38.4 C) 99.6 F (  37.6 C)  TempSrc: Oral  Oral Oral  SpO2: 98%     Weight:      Height:       General: Vital signs reviewed.  Patient is agitated, intubated, well developed.  Head: Normocephalic and atraumatic. Eyes: Pupils equal, round, minimally reactive to light, conjunctivae normal, no scleral icterus.  Neck: Supple, trachea midline.  Cardiovascular: Tachycardic, regular rhythm, S1 normal, S2 normal, no murmurs, gallops, or rubs. Pulmonary/Chest: Clear to anterior auscultation bilaterally, no wheezes, rales, or rhonchi. Abdominal: Soft, non-tender, non-distended, hyperactive BS +, no guarding present.  Extremities: No lower extremity edema bilaterally, pulses symmetric and intact bilaterally.  Neurological: Awake, does not follow commands, moves right UE  and LE on own, but limited movement of LLE and LUE, does not move with command.  Skin: Warm, dry and intact. No rashes or erythema. Psychiatric: Unable to assess   LABS: Results for orders placed or performed during the hospital encounter of 10/29/15 (from the past 48 hour(s))  Troponin I (q 6hr x 3)     Status: Abnormal   Collection Time: 10/29/15 11:31 PM  Result Value Ref Range   Troponin I 0.08 (HH) <0.03 ng/mL    Comment: CRITICAL VALUE NOTED.  VALUE IS CONSISTENT WITH PREVIOUSLY REPORTED AND CALLED VALUE.  Glucose, capillary     Status: Abnormal   Collection Time: 10/30/15 12:30 AM  Result Value Ref Range   Glucose-Capillary 122 (H) 65 - 99 mg/dL   Comment 1 Notify RN   Glucose, capillary     Status: Abnormal   Collection Time: 10/30/15  4:35 AM  Result Value Ref Range   Glucose-Capillary 141 (H) 65 - 99 mg/dL   Comment 1 Notify RN   CBC     Status: Abnormal   Collection Time: 10/30/15  4:46 AM  Result Value Ref Range   WBC 21.8 (H) 4.0 - 10.5 K/uL   RBC 4.55 4.22 - 5.81 MIL/uL   Hemoglobin 13.0 13.0 - 17.0 g/dL   HCT 40.8 39.0 - 52.0 %   MCV 89.7 78.0 - 100.0 fL   MCH 28.6 26.0 - 34.0 pg   MCHC 31.9 30.0 - 36.0 g/dL   RDW 13.6 11.5 - 15.5 %   Platelets 169 150 - 400 K/uL  Basic metabolic panel     Status: Abnormal   Collection Time: 10/30/15  4:46 AM  Result Value Ref Range   Sodium 140 135 - 145 mmol/L   Potassium 3.5 3.5 - 5.1 mmol/L   Chloride 108 101 - 111 mmol/L   CO2 24 22 - 32 mmol/L   Glucose, Bld 147 (H) 65 - 99 mg/dL   BUN 25 (H) 6 - 20 mg/dL   Creatinine, Ser 1.24 0.61 - 1.24 mg/dL   Calcium 8.5 (L) 8.9 - 10.3 mg/dL   GFR calc non Af Amer >60 >60 mL/min   GFR calc Af Amer >60 >60 mL/min    Comment: (NOTE) The eGFR has been calculated using the CKD EPI equation. This calculation has not been validated in all clinical situations. eGFR's persistently <60 mL/min signify possible Chronic Kidney Disease.    Anion gap 8 5 - 15  Lactic acid, plasma      Status: None   Collection Time: 10/30/15  4:46 AM  Result Value Ref Range   Lactic Acid, Venous 1.7 0.5 - 1.9 mmol/L  Magnesium     Status: Abnormal   Collection Time: 10/30/15  4:46 AM  Result Value Ref Range  Magnesium 2.6 (H) 1.7 - 2.4 mg/dL  Phosphorus     Status: Abnormal   Collection Time: 10/30/15  4:46 AM  Result Value Ref Range   Phosphorus 2.4 (L) 2.5 - 4.6 mg/dL  Blood gas, arterial     Status: Abnormal   Collection Time: 10/30/15  5:10 AM  Result Value Ref Range   FIO2 40.00    Delivery systems VENTILATOR    Mode PRESSURE REGULATED VOLUME CONTROL    VT 550 mL   LHR 15 resp/min   Peep/cpap 5.0 cm H20   pH, Arterial 7.457 (H) 7.350 - 7.450   pCO2 arterial 35.9 32.0 - 48.0 mmHg   pO2, Arterial 119 (H) 83.0 - 108.0 mmHg   Bicarbonate 25.0 20.0 - 28.0 mmol/L   Acid-Base Excess 1.5 0.0 - 2.0 mmol/L   O2 Saturation 98.5 %   Patient temperature 98.6    Collection site LEFT RADIAL    Drawn by [id number]    Sample type ARTERIAL DRAW    Allens test (pass/fail) PASS PASS  Glucose, capillary     Status: Abnormal   Collection Time: 10/30/15  7:33 AM  Result Value Ref Range   Glucose-Capillary 142 (H) 65 - 99 mg/dL   Comment 1 Capillary Specimen   Culture, respiratory (NON-Expectorated)     Status: None (Preliminary result)   Collection Time: 10/30/15 11:22 AM  Result Value Ref Range   Specimen Description TRACHEAL ASPIRATE    Special Requests NONE    Gram Stain      ABUNDANT WBC PRESENT,BOTH PMN AND MONONUCLEAR RARE SQUAMOUS EPITHELIAL CELLS PRESENT RARE GRAM POSITIVE COCCI IN PAIRS RARE GRAM POSITIVE RODS    Culture RARE CANDIDA ALBICANS    Report Status PENDING   Glucose, capillary     Status: Abnormal   Collection Time: 10/30/15 11:28 AM  Result Value Ref Range   Glucose-Capillary 118 (H) 65 - 99 mg/dL  Glucose, capillary     Status: Abnormal   Collection Time: 10/30/15  3:24 PM  Result Value Ref Range   Glucose-Capillary 138 (H) 65 - 99 mg/dL  Magnesium      Status: None   Collection Time: 10/30/15  5:12 PM  Result Value Ref Range   Magnesium 2.3 1.7 - 2.4 mg/dL  Phosphorus     Status: None   Collection Time: 10/30/15  5:12 PM  Result Value Ref Range   Phosphorus 2.9 2.5 - 4.6 mg/dL  Glucose, capillary     Status: Abnormal   Collection Time: 10/30/15  8:12 PM  Result Value Ref Range   Glucose-Capillary 142 (H) 65 - 99 mg/dL  Glucose, capillary     Status: Abnormal   Collection Time: 10/30/15 11:57 PM  Result Value Ref Range   Glucose-Capillary 127 (H) 65 - 99 mg/dL   Comment 1 Notify RN   BMET in AM     Status: Abnormal   Collection Time: 10/31/15  1:11 AM  Result Value Ref Range   Sodium 142 135 - 145 mmol/L   Potassium 3.5 3.5 - 5.1 mmol/L   Chloride 110 101 - 111 mmol/L   CO2 25 22 - 32 mmol/L   Glucose, Bld 123 (H) 65 - 99 mg/dL   BUN 25 (H) 6 - 20 mg/dL   Creatinine, Ser 1.07 0.61 - 1.24 mg/dL   Calcium 8.4 (L) 8.9 - 10.3 mg/dL   GFR calc non Af Amer >60 >60 mL/min   GFR calc Af Amer >60 >60 mL/min  Comment: (NOTE) The eGFR has been calculated using the CKD EPI equation. This calculation has not been validated in all clinical situations. eGFR's persistently <60 mL/min signify possible Chronic Kidney Disease.    Anion gap 7 5 - 15  CBC     Status: Abnormal   Collection Time: 10/31/15  1:11 AM  Result Value Ref Range   WBC 16.4 (H) 4.0 - 10.5 K/uL   RBC 4.26 4.22 - 5.81 MIL/uL   Hemoglobin 12.5 (L) 13.0 - 17.0 g/dL   HCT 38.9 (L) 39.0 - 52.0 %   MCV 91.3 78.0 - 100.0 fL   MCH 29.3 26.0 - 34.0 pg   MCHC 32.1 30.0 - 36.0 g/dL   RDW 13.7 11.5 - 15.5 %   Platelets 180 150 - 400 K/uL  Magnesium     Status: None   Collection Time: 10/31/15  1:11 AM  Result Value Ref Range   Magnesium 2.3 1.7 - 2.4 mg/dL  Phosphorus     Status: None   Collection Time: 10/31/15  1:11 AM  Result Value Ref Range   Phosphorus 2.7 2.5 - 4.6 mg/dL  Triglycerides     Status: Abnormal   Collection Time: 10/31/15  1:11 AM  Result Value Ref  Range   Triglycerides 286 (H) <150 mg/dL  Blood gas, arterial     Status: Abnormal   Collection Time: 10/31/15  4:35 AM  Result Value Ref Range   FIO2 0.40    Delivery systems VENTILATOR    Mode PRESSURE REGULATED VOLUME CONTROL    VT 500 mL   LHR 15 resp/min   Peep/cpap 5.0 cm H20   pH, Arterial 7.437 7.350 - 7.450   pCO2 arterial 38.1 32.0 - 48.0 mmHg   pO2, Arterial 131 (H) 83.0 - 108.0 mmHg   Bicarbonate 25.3 20.0 - 28.0 mmol/L   Acid-Base Excess 1.5 0.0 - 2.0 mmol/L   O2 Saturation 98.6 %   Patient temperature 98.6    Collection site LEFT RADIAL    Drawn by [id number]    Sample type ARTERIAL DRAW    Allens test (pass/fail) PASS PASS  Glucose, capillary     Status: Abnormal   Collection Time: 10/31/15  7:24 AM  Result Value Ref Range   Glucose-Capillary 120 (H) 65 - 99 mg/dL   Comment 1 Capillary Specimen   Glucose, capillary     Status: Abnormal   Collection Time: 10/31/15  1:11 PM  Result Value Ref Range   Glucose-Capillary 106 (H) 65 - 99 mg/dL   Comment 1 Capillary Specimen     MICRO: BCx 10/28 >>GBS (Strep Agalactiae) UCx 10/28 >> NGTD Respiratory Cx >> Rare Candida Albicans BCx 10/30 >> pending  IMAGING: Dg Chest Port 1 View  Result Date: 10/31/2015 CLINICAL DATA:  Respiratory failure, sepsis, mitral insufficiency. Intubated patient. EXAM: PORTABLE CHEST 1 VIEW COMPARISON:  Portable chest x-ray of October 30, 2015 FINDINGS: The patient is rotated toward the right. The lungs are reasonably well inflated. Mild interstitial prominence in the right lower lobe persists. There is no pneumothorax or pleural effusion. The heart is top-normal in size. The pulmonary vascularity is normal. The endotracheal tube tip lies approximately 6 cm above the carina. The esophagogastric tube tip in proximal port lie below the GE junction. IMPRESSION: Stable appearance of the chest allowing for rotation. Subsegmental atelectasis in the left lower lobe persists. The support tubes are in  reasonable position. Electronically Signed   By: David  Martinique M.D.   On: 10/31/2015  07:04   Dg Chest Port 1 View  Result Date: 10/30/2015 CLINICAL DATA:  Respiratory failure EXAM: PORTABLE CHEST 1 VIEW COMPARISON:  Chest radiograph from one day prior. FINDINGS: Endotracheal tube tip is 3.1 cm above the carina. Enteric tube enters stomach with the tip not seen on this image. Stable cardiomediastinal silhouette with mild cardiomegaly. No pneumothorax. No pleural effusion. Slightly low lung volumes. No pulmonary edema. Mild left basilar atelectasis. IMPRESSION: 1. Support structures as described. 2. Slightly low lung volumes with mild left basilar atelectasis. 3. Stable mild cardiomegaly without pulmonary edema. Electronically Signed   By: Ilona Sorrel M.D.   On: 10/30/2015 08:13   Assessment/Plan:   Mr. 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   Sepsis 2/2 GBS Bacteremia: Patient presented altered, febrile, with leukocytosis and found to have Strep Agalactiae bacteremia 2 days following a urologic procedure for BPH. Patient was initially started on Vanc/Zosyn and has been narrowed to Zosyn on 10/30. Patient continues to be febrile, but with decreasing leukocytosis from 24 to 16 today. TEE was negative for vegetations. Repeat BCx have been drawn. Patient remains encephalopathic, intubated and critically ill. Prior to procedure, patient was in his normal state of health. He is very active per chart and was recently in [place]. Bacteremia may be secondary to urologic procedure as the urinary tract serves as the source of invasive GBS in 5-15% of patients. However, UA was without infectious signs on admission and UCx was NGTD. He could aslo have bacteremia without a clear source as  this occurs in 30-40% of all invasive GBS episodes. However, the source is not clear in his case. We will continue to pursue work up for etiology with CT abdomen/pelvis with contrast to evaluate for abscess and possible MRI when able. I would favor narrowing patient from Zosyn to Penicillin G or Vancomycin.  -Zosyn 10/28 >> d/c -Transition to Penicillin G -BCx 10/28 >>GBS (Strep Agalactiae) -UCx 10/28 >> NGTD -Respiratory Cx >> Rare Candida Albicans -BCx 10/30 >> pending -Check HIV -Obtain CT abdomen/pelvis to assess for abscess -Consider MRI when able  Loose Stools: Per nursing, patient had two episode of loose stool starting last night and 3 today. His stools is mixed with liquid and formed stool. His leukocytosis is improving. Stool is not excessively malodorous or toxic smelling. Given his presentation, I doubt C. Diff at this time and loose stool may be secondary to antibiotics.  -Continue to monitor  Martyn Malay, DO PGY-3 Internal Medicine Resident Pager # [phone] 10/31/2015 5:30 PM

## 2015-10-31 NOTE — Progress Notes (Signed)
EEG completed; results pending.    

## 2015-10-31 NOTE — Progress Notes (Signed)
  Echocardiogram Echocardiogram Transesophageal has been performed.  Jennette Dubin 10/31/2015, 10:48 AM

## 2015-10-31 NOTE — Progress Notes (Signed)
Subjective: Pt intubated  Minimally responsive  Objective: Vitals:   10/31/15 0348 10/31/15 0400 10/31/15 0500 10/31/15 0600  BP:  126/74 (!) 161/91 139/78  Pulse:  85 90 88  Resp:  15 15 16   Temp:  99.8 F (37.7 C)    TempSrc:  Axillary    SpO2: 99% 99% 100% 98%  Weight:  201 lb 11.5 oz (91.5 kg)    Height:       Weight change: -2 lb 13.9 oz (-1.3 kg)  Intake/Output Summary (Last 24 hours) at 10/31/15 0804 Last data filed at 10/31/15 0600  Gross per 24 hour  Intake          1694.92 ml  Output             1800 ml  Net          -105.08 ml    General: Alert, awake, oriented x3, in no acute distress Neck:  JVP is normal Heart: Regular rate and rhythm, without murmurs, rubs, gallops.  Lungs: Clear to auscultation.  No rales or wheezes. Exemities:  No edema.   Neuro: Deferred    Tele  SR    Lab Results: Results for orders placed or performed during the hospital encounter of 10/29/15 (from the past 24 hour(s))  Culture, respiratory (NON-Expectorated)     Status: None (Preliminary result)   Collection Time: 10/30/15 11:22 AM  Result Value Ref Range   Specimen Description TRACHEAL ASPIRATE    Special Requests NONE    Gram Stain      ABUNDANT WBC PRESENT,BOTH PMN AND MONONUCLEAR RARE SQUAMOUS EPITHELIAL CELLS PRESENT RARE GRAM POSITIVE COCCI IN PAIRS RARE GRAM POSITIVE RODS    Culture PENDING    Report Status PENDING   Glucose, capillary     Status: Abnormal   Collection Time: 10/30/15 11:28 AM  Result Value Ref Range   Glucose-Capillary 118 (H) 65 - 99 mg/dL  Glucose, capillary     Status: Abnormal   Collection Time: 10/30/15  3:24 PM  Result Value Ref Range   Glucose-Capillary 138 (H) 65 - 99 mg/dL  Magnesium     Status: None   Collection Time: 10/30/15  5:12 PM  Result Value Ref Range   Magnesium 2.3 1.7 - 2.4 mg/dL  Phosphorus     Status: None   Collection Time: 10/30/15  5:12 PM  Result Value Ref Range   Phosphorus 2.9 2.5 - 4.6 mg/dL  Glucose,  capillary     Status: Abnormal   Collection Time: 10/30/15  8:12 PM  Result Value Ref Range   Glucose-Capillary 142 (H) 65 - 99 mg/dL  Glucose, capillary     Status: Abnormal   Collection Time: 10/30/15 11:57 PM  Result Value Ref Range   Glucose-Capillary 127 (H) 65 - 99 mg/dL   Comment 1 Notify RN   BMET in AM     Status: Abnormal   Collection Time: 10/31/15  1:11 AM  Result Value Ref Range   Sodium 142 135 - 145 mmol/L   Potassium 3.5 3.5 - 5.1 mmol/L   Chloride 110 101 - 111 mmol/L   CO2 25 22 - 32 mmol/L   Glucose, Bld 123 (H) 65 - 99 mg/dL   BUN 25 (H) 6 - 20 mg/dL   Creatinine, Ser 1.07 0.61 - 1.24 mg/dL   Calcium 8.4 (L) 8.9 - 10.3 mg/dL   GFR calc non Af Amer >60 >60 mL/min   GFR calc Af Amer >60 >60 mL/min  Anion gap 7 5 - 15  CBC     Status: Abnormal   Collection Time: 10/31/15  1:11 AM  Result Value Ref Range   WBC 16.4 (H) 4.0 - 10.5 K/uL   RBC 4.26 4.22 - 5.81 MIL/uL   Hemoglobin 12.5 (L) 13.0 - 17.0 g/dL   HCT 38.9 (L) 39.0 - 52.0 %   MCV 91.3 78.0 - 100.0 fL   MCH 29.3 26.0 - 34.0 pg   MCHC 32.1 30.0 - 36.0 g/dL   RDW 13.7 11.5 - 15.5 %   Platelets 180 150 - 400 K/uL  Magnesium     Status: None   Collection Time: 10/31/15  1:11 AM  Result Value Ref Range   Magnesium 2.3 1.7 - 2.4 mg/dL  Phosphorus     Status: None   Collection Time: 10/31/15  1:11 AM  Result Value Ref Range   Phosphorus 2.7 2.5 - 4.6 mg/dL  Triglycerides     Status: Abnormal   Collection Time: 10/31/15  1:11 AM  Result Value Ref Range   Triglycerides 286 (H) <150 mg/dL  Blood gas, arterial     Status: Abnormal   Collection Time: 10/31/15  4:35 AM  Result Value Ref Range   FIO2 0.40    Delivery systems VENTILATOR    Mode PRESSURE REGULATED VOLUME CONTROL    VT 500 mL   LHR 15 resp/min   Peep/cpap 5.0 cm H20   pH, Arterial 7.437 7.350 - 7.450   pCO2 arterial 38.1 32.0 - 48.0 mmHg   pO2, Arterial 131 (H) 83.0 - 108.0 mmHg   Bicarbonate 25.3 20.0 - 28.0 mmol/L   Acid-Base Excess  1.5 0.0 - 2.0 mmol/L   O2 Saturation 98.6 %   Patient temperature 98.6    Collection site LEFT RADIAL    Drawn by [id number]    Sample type ARTERIAL DRAW    Allens test (pass/fail) PASS PASS  Glucose, capillary     Status: Abnormal   Collection Time: 10/31/15  7:24 AM  Result Value Ref Range   Glucose-Capillary 120 (H) 65 - 99 mg/dL   Comment 1 Capillary Specimen     Studies/Results: Dg Chest Port 1 View  Result Date: 10/31/2015 CLINICAL DATA:  Respiratory failure, sepsis, mitral insufficiency. Intubated patient. EXAM: PORTABLE CHEST 1 VIEW COMPARISON:  Portable chest x-ray of October 30, 2015 FINDINGS: The patient is rotated toward the right. The lungs are reasonably well inflated. Mild interstitial prominence in the right lower lobe persists. There is no pneumothorax or pleural effusion. The heart is top-normal in size. The pulmonary vascularity is normal. The endotracheal tube tip lies approximately 6 cm above the carina. The esophagogastric tube tip in proximal port lie below the GE junction. IMPRESSION: Stable appearance of the chest allowing for rotation. Subsegmental atelectasis in the left lower lobe persists. The support tubes are in reasonable position. Electronically Signed   By: David  Martinique M.D.   On: 10/31/2015 07:04    MedicationsREviewed    @PROBHOSP @  Pt familiar to me from clinic  I saw him in 10 /16  He was doing well at the time  Had just gotten back from [place] prior  Very active   Hx of endocarditis with complications back in 0000000   Now with altered mental status and bacteremia   Need to r/o new infection of valve   Will plan for TEE today  Compare to last TEE  ID should see pt     LOS: 2 days  Dorris Carnes 10/31/2015, 8:04 AM

## 2015-10-31 NOTE — Progress Notes (Signed)
Pt didn't wean today due to not being able to follow directions. RT wasn't able to get the Pt to open his eyes. 1600 hour he was more responsive, RT will wean 11/01/15. Pt has no secretions

## 2015-10-31 NOTE — CV Procedure (Signed)
     Transesophageal Echocardiogram Note  [name] [id number] [birth year]  Procedure: Transesophageal Echocardiogram Indications: Strep bacteremia, h/o mitral valve endocarditis  Procedure Details Consent: Obtained Time Out: Verified patient identification, verified procedure, site/side was marked, verified correct patient position, special equipment/implants available, Radiology Safety Procedures followed,  medications/allergies/relevent history reviewed, required imaging and test results available.  Performed  Medications: IV propofol infusion  - Left ventricle: Systolic function was normal. The estimated   ejection fraction was in the range of 60% to 65%. Wall motion was   normal; there were no regional wall motion abnormalities. - Aortic valve: There is moderate central regurgitation most   probably sec to dilated aortic root. No vegetation seen. There   was moderate regurgitation. - Aorta: Aortic root is mildly dilated measuring 41 mm. - Ascending aorta: The ascending aorta was normal in size. - Descending aorta: The descending aorta was normal in size. - Mitral valve: Mitral valve leaflets are thickened with bi-leaflet   prolapse posterior > anterior with moderate mitral regurgitation.   There are 3 jets, no obvious vegetation and no perforation. - Left atrium: The atrium was dilated. No evidence of thrombus in   the atrial cavity or appendage. No evidence of thrombus in the   atrial cavity or appendage. - Right ventricle: Systolic function was normal. - Right atrium: No evidence of thrombus in the atrial cavity or   appendage. - Atrial septum: No defect or patent foramen ovale was identified. - Tricuspid valve: There was mild regurgitation. - Pulmonic valve: No evidence of vegetation.  Impressions:  - There is no obvious vegetation.   Negative bubble study.  Complications: No apparent complications Patient did tolerate procedure well.  Maurice Dawley, MD,  [facility] 10/31/2015, 10:59 AM

## 2015-10-31 NOTE — Progress Notes (Signed)
ID MD aware of frequent loose BM.  No orders to test c. diff at this time.  Mosie Lukes, RN

## 2015-11-01 ENCOUNTER — Inpatient Hospital Stay (HOSPITAL_COMMUNITY): Payer: BLUE CROSS/BLUE SHIELD

## 2015-11-01 LAB — HIV ANTIBODY (ROUTINE TESTING W REFLEX): HIV SCREEN 4TH GENERATION: NONREACTIVE

## 2015-11-01 LAB — BASIC METABOLIC PANEL
ANION GAP: 8 (ref 5–15)
BUN: 23 mg/dL — ABNORMAL HIGH (ref 6–20)
CALCIUM: 8.7 mg/dL — AB (ref 8.9–10.3)
CO2: 26 mmol/L (ref 22–32)
Chloride: 108 mmol/L (ref 101–111)
Creatinine, Ser: 1.02 mg/dL (ref 0.61–1.24)
GLUCOSE: 123 mg/dL — AB (ref 65–99)
POTASSIUM: 4 mmol/L (ref 3.5–5.1)
Sodium: 142 mmol/L (ref 135–145)

## 2015-11-01 LAB — BLOOD GAS, ARTERIAL
ACID-BASE EXCESS: 1.7 mmol/L (ref 0.0–2.0)
BICARBONATE: 25.3 mmol/L (ref 20.0–28.0)
DRAWN BY: 365271
FIO2: 40
LHR: 15 {breaths}/min
O2 SAT: 98.4 %
PEEP/CPAP: 5 cmH2O
PH ART: 7.436 (ref 7.350–7.450)
Patient temperature: 100.3
VT: 550 mL
pCO2 arterial: 38.7 mmHg (ref 32.0–48.0)
pO2, Arterial: 128 mmHg — ABNORMAL HIGH (ref 83.0–108.0)

## 2015-11-01 LAB — GLUCOSE, CAPILLARY
GLUCOSE-CAPILLARY: 142 mg/dL — AB (ref 65–99)
GLUCOSE-CAPILLARY: 93 mg/dL (ref 65–99)
Glucose-Capillary: 126 mg/dL — ABNORMAL HIGH (ref 65–99)
Glucose-Capillary: 92 mg/dL (ref 65–99)
Glucose-Capillary: 108 mg/dL — ABNORMAL HIGH (ref 65–99)

## 2015-11-01 LAB — CBC
HEMATOCRIT: 39.2 % (ref 39.0–52.0)
Hemoglobin: 12.6 g/dL — ABNORMAL LOW (ref 13.0–17.0)
MCH: 29.7 pg (ref 26.0–34.0)
MCHC: 32.1 g/dL (ref 30.0–36.0)
MCV: 92.5 fL (ref 78.0–100.0)
PLATELETS: 184 10*3/uL (ref 150–400)
RBC: 4.24 MIL/uL (ref 4.22–5.81)
RDW: 13.4 % (ref 11.5–15.5)
WBC: 13.4 10*3/uL — AB (ref 4.0–10.5)

## 2015-11-01 LAB — PHOSPHORUS: Phosphorus: 3.3 mg/dL (ref 2.5–4.6)

## 2015-11-01 LAB — CULTURE, RESPIRATORY

## 2015-11-01 LAB — MAGNESIUM: MAGNESIUM: 2.1 mg/dL (ref 1.7–2.4)

## 2015-11-01 LAB — CULTURE, RESPIRATORY W GRAM STAIN

## 2015-11-01 MED ORDER — ACETAMINOPHEN 10 MG/ML IV SOLN
1000.0000 mg | Freq: Four times a day (QID) | INTRAVENOUS | Status: AC | PRN
  Administered 2015-11-01 – 2015-11-02 (×2): 1000 mg via INTRAVENOUS
  Filled 2015-11-01 (×4): qty 100

## 2015-11-01 MED ORDER — HYDRALAZINE HCL 20 MG/ML IJ SOLN: 5.0000 mg | Freq: Four times a day (QID) | INTRAMUSCULAR | Status: DC | PRN

## 2015-11-01 MED ORDER — HALOPERIDOL LACTATE 5 MG/ML IJ SOLN
1.0000 mg | INTRAMUSCULAR | Status: DC | PRN
  Administered 2015-11-01 (×2): 4 mg via INTRAVENOUS
  Administered 2015-11-01: 3 mg via INTRAVENOUS
  Administered 2015-11-02 – 2015-11-03 (×4): 4 mg via INTRAVENOUS
  Filled 2015-11-01 (×8): qty 1

## 2015-11-01 NOTE — Progress Notes (Signed)
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 In the ED he was found to be febrile and combative and required intubation. Two days prior to his presentation he had a urologic procedure done (details unclear), but it was a trans-urethral and trans-rectal approach to address his prostatic hypertrophy. He was given a dose of cipro to take the afternoon of his procedure, and according to his wife seemed to be well for the day following his procedure. They went to bed the evening prior to his presentation without incident, but his wife found him lying on the bathroom floor, having been incontinent of bowel and bladder. However, he had taken his pants off and was in the bathroom and appeared to have been attempting to intentionally move his bowels and void on the toilet but fell over before he could be seated on the toilet. She found him down, awake, but not lucid.  SUBJECTIVE:  Agitation when sedation is weaned down. On weaning trial today.  VITAL SIGNS: BP (!) 172/89 (BP Location: Right Arm)   Pulse 73   Temp 100.2 F (37.9 C) (Oral)   Resp 19   Ht 5\' 8"  (1.727 m)   Wt 202 lb 2.6 oz (91.7 kg)   SpO2 100%   BMI 30.74 kg/m   HEMODYNAMICS:   VENTILATOR SETTINGS: Vent Mode: CPAP FiO2 (%):  [40 %] 40 % Set Rate:  [15 bmp] 15 bmp Vt Set:  [550 mL] 550 mL PEEP:  [5 cmH20] 5 cmH20 Pressure Support:  [5 cmH20] 5 cmH20 Plateau Pressure:  [13 cmH20-17 cmH20] 13 cmH20  INTAKE / OUTPUT: I/O last 3 completed shifts: In: 4299.8 [I.V.:1509.8; NG/GT:1440; IV Piggyback:1350] Out: 1985 [id number]; Stool:200]  PHYSICAL EXAMINATION: General:  Well-appearing man, agitated and intubated Neuro:  Moves all ext to  pain but not command, pupils are reactive. HEENT: ETT in place, neck supple, Moist mucus membranes Cardiovascular:  RRR, Nl S1/S2, No MRG Lungs:  Clear, no wheeze or crackles Abdomen:  Soft, NT, ND and +BS. Musculoskeletal:  No apparent injuries to extremities Skin:  No rashes  LABS:  BMET  Recent Labs Lab 10/30/15 0446 10/31/15 0111 11/01/15 0146  NA 140 142 142  K 3.5 3.5 4.0  CL 108 110 108  CO2 24 25 26   BUN 25* 25* 23*  CREATININE 1.24 1.07 1.02  GLUCOSE 147* 123* 123*   Electrolytes  Recent Labs Lab 10/30/15 0446 10/30/15 1712 10/31/15 0111 11/01/15 0146  CALCIUM 8.5*  --  8.4* 8.7*  MG 2.6* 2.3 2.3 2.1  PHOS 2.4* 2.9 2.7 3.3   CBC  Recent Labs Lab 10/30/15 0446 10/31/15 0111 11/01/15 0146  WBC 21.8* 16.4* 13.4*  HGB 13.0 12.5* 12.6*  HCT 40.8 38.9* 39.2  PLT 169 180 184   Coag's  Recent Labs Lab 10/29/15 0355  APTT 25  INR 1.08   Sepsis Markers  Recent Labs Lab 10/29/15 0417 10/29/15 1127 10/30/15 0446  LATICACIDVEN 3.20* 3.0* 1.7   ABG  Recent Labs Lab 10/30/15 0510 10/31/15 0435 11/01/15 0425  PHART 7.457* 7.437 7.436  PCO2ART 35.9 38.1 38.7  PO2ART 119* 131* 128*   Liver Enzymes  Recent Labs Lab 10/29/15 0355  AST 35  ALT 35  ALKPHOS 85  BILITOT 1.1  ALBUMIN 4.6   Cardiac Enzymes  Recent Labs Lab 10/29/15 1117 10/29/15 1644 10/29/15 2331  TROPONINI 0.10* 0.10* 0.08*   Glucose  Recent Labs Lab 10/31/15 1311 10/31/15 1606 10/31/15 2012 10/31/15 2309 11/01/15 0306 11/01/15 0757  GLUCAP 106* 115* 148* 125* 126* 142*   Imaging Ct Abdomen Pelvis W Contrast  Result Date: 11/01/2015 CLINICAL DATA:  60 y/o  M; bacteremia. EXAM: CT ABDOMEN AND PELVIS WITH CONTRAST TECHNIQUE: Multidetector CT imaging of the abdomen and pelvis was performed using the standard protocol following bolus administration of intravenous contrast. CONTRAST:  145mL ISOVUE-300 IOPAMIDOL (ISOVUE-300) INJECTION 61% COMPARISON:   01/22/2006 CT of abdomen and pelvis. FINDINGS: Lower chest: Small bilateral pleural effusions and minor dependent atelectasis. Hepatobiliary: New nonspecific lucent well-circumscribed lesion dense liver segment 3 measuring 14 mm (series 2, image 20). Otherwise no focal liver lesion is identified. No intra or extrahepatic biliary ductal dilatation. Normal gallbladder. Pancreas: Unremarkable. No pancreatic ductal dilatation or surrounding inflammatory changes. Spleen: Normal in size without focal abnormality. Adrenals/Urinary Tract: Right kidney interpolar heterogeneous mass measuring 43 x 35 x 53 mm (AP x ML x CC), series 2, image 27 and series 5, image 105. Additionally in the right kidney upper pole there is a fluid attenuating lesion measuring 17 mm probably representing a cyst. No urinary stone disease or obstructive uropathy is identified. Bladder is unremarkable. Stomach/Bowel: Enteric tube with tip in the distal stomach noted. No obstructive or inflammatory changes of bowel identified. Nonvisualized appendix. Rectal tube. Vascular/Lymphatic: Aortic atherosclerosis. No enlarged abdominal or pelvic lymph nodes. Reproductive: Prostate is unremarkable. Other: No abdominal wall hernia or abnormality. No abdominopelvic ascites. Musculoskeletal: No acute or significant osseous findings. IMPRESSION: 1. 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 Further characterization with renal protocol CT or MRI is recommended. No lymphadenopathy is identified. 2. Nonspecific 14 mm lucent lesion in liver segment 3, new from 2008. Attention at follow-up is recommended. 3. Small bilateral pleural effusions and minor dependent atelectasis at the lung bases. 4. Otherwise no acute process identified. These results will be called to the ordering clinician or representative by the Radiologist Assistant, and communication documented in the PACS or zVision Dashboard. Electronically Signed   By: Kristine Garbe M.D.   On: 11/01/2015 02:24   Dg Chest Port 1 View  Result Date: 11/01/2015 CLINICAL DATA:  Respiratory failure. EXAM: PORTABLE CHEST 1 VIEW COMPARISON:  10/31/2015. FINDINGS: Endotracheal tube and NG tube in stable position. Heart size stable. Low lung volumes with basilar atelectasis. Left base infiltrate. Tiny left pleural effusion cannot be excluded. IMPRESSION: 1. Lines and tubes in stable position. 2. Low lung volumes with basilar atelectasis. Left base infiltrate with small left pleural effusion noted. Electronically Signed   By: Marcello Moores  Register   On: 11/01/2015 07:01   STUDIES:  Head CT >> negative Neck CT >> negative  CULTURES: Blood 10/28 >> Panel +Strep species/agalactiae >> RVP neg 10/28   ANTIBIOTICS: Vanc  10/28 >> 10/30 Zosyn 10/28 >> 10/30 Pencillin 10/30 >>  SIGNIFICANT EVENTS: 10/28>>>Intubated in the ED for agitation  LINES/TUBES: ETT 7.5 mm 10/28 >> 10/30 PIV x2 Condom cath OGT  DISCUSSION: 60 y/o p/w sepsis, group B strep bacteremia  ASSESSMENT / PLAN:  PULMONARY A: Need for mechanical ventilation P:   Doing well on weaning trial. Plan for extubation today.  CARDIOVASCULAR A:  HLD Hx of MV endocarditis w/ MI /CVA 2010 Troponin  bump .05>.10>.10>.08 ? Demand ischemia w/ sepsis  P:  Continue Crestor. Cardiology on board  RENAL A:   Lactic acidosis>LA trend down  Hypomagnesium >replaced  Hypokalemia >replaced  CT abd/pelvis noted with renal mass. Will need follow up and biopsy. P:   Due to sepsis BMET in AM Replace electrolytes as indicated. NS KVO   GASTROINTESTINAL A:  Nutrition  P:   Keep NPO Swallow eval after extubation PPI  HEMATOLOGIC A:   No active issues P:  Transfuse per ICU protocol.  INFECTIOUS A:   Severe sepsis w/ strep bacteremia ? Urological procedure on 10/25 [facility] panel +strep , RVP neg  Hx of endocarditis w/ CVA/MI in 2010  No vegetations on TEE 10/31 P:   Follow blood cultures.   Abx changed to pencillin ID on board.  ENDOCRINE A:   No active issues   P:   Monitor  NEUROLOGIC A:   AMS most likely d/t sepsis   Hx of stroke Neck is supple, doubt meningitis -neuro consult 10/28 , MRI if mentation does not improve or worsens P:   RASS goal: 0 Off sedation now. Will use precedex if needed for agitation  FAMILY  - Updates: Wife updated 10/29, 10/30, 10/31 - Inter-disciplinary family meet or Palliative Care meeting due by:  11/05/15  Critical care time- 35 mins.  Marshell Garfinkel MD [facility] Pager [phone] If no answer or after 3pm call: [phone] 11/01/2015, 8:58 AM

## 2015-11-01 NOTE — Progress Notes (Signed)
[facility]    Date of Admission:  10/29/2015   Total days of antibiotics 4        Day 2 Penicillin G          ID: [name] is a 60 y.o. 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  Active Problems:   Sepsis ([facility])   Delirium   Respiratory failure (HCC)   Acute encephalopathy   Elevated troponin   MVP (mitral valve prolapse)   Mitral valve insufficiency   Bacteremia  Subjective: Patient was seen and examined this morning. He was extubated this morning. He follows some commands. He continues to be febrile. His wife has requested [place] not to share the results of the renal mass with him at this time.   Medications:  . chlorhexidine gluconate (MEDLINE KIT)  15 mL Mouth Rinse BID  . mouth rinse  15 mL Mouth Rinse 10 times per day  . pantoprazole sodium  40 mg Per Tube Daily  . pencillin G potassium IV  4 Million Units Intravenous Q4H  . rosuvastatin  10 mg Oral Daily    Objective: Vitals:   11/01/15 0730 11/01/15 0800 11/01/15 0900 11/01/15 1000  BP:  (!) 172/89 (!) 157/84 (!) 169/82  Pulse: 69 73 89   Resp: '15 19 17 ' (!) 22  Temp:  100.2 F (37.9 C)    TempSrc:  Oral    SpO2: 100% 100% 99%   Weight:      Height:       General: Vital signs reviewed.  Patient is agitated, well developed.  Cardiovascular: Regular rate, regular rhythm, S1 normal, S2 normal, no murmurs, gallops, or rubs. Pulmonary/Chest: Clear to anterior auscultation bilaterally, no wheezes, rales, or rhonchi. Abdominal: Soft, non-tender, non-distended, hyperactive BS +, no guarding present.  Extremities: No lower extremity edema bilaterally, pulses symmetric and intact bilaterally.  Neurological: Awake, follows some commands, moves all extremities. Skin: Warm, dry  and intact. No rashes or erythema.  Lab Results  Recent Labs  10/31/15 0111 11/01/15 0146  WBC 16.4* 13.4*  HGB 12.5* 12.6*  HCT 38.9* 39.2  NA 142 142  K 3.5 4.0  CL 110 108  CO2 25 26  BUN 25* 23*  CREATININE 1.07 1.02    Microbiology: BCx 10/28 >>GBS (Strep Agalactiae) UCx 10/28 >> NGTD Respiratory Cx >> Rare Candida Albicans BCx 10/30 >> pending  Antibiotics: Vancomycin 10/28 >> 10/30 Zosyn 10/28 >>10/30 Penicillin G 10/30 >>  Studies/Results: Ct Abdomen Pelvis W Contrast  Result Date: 11/01/2015 CLINICAL DATA:  60 y/o  M; bacteremia. EXAM: CT ABDOMEN AND PELVIS WITH CONTRAST TECHNIQUE: Multidetector CT imaging of the abdomen and pelvis was performed using the standard protocol following bolus administration of intravenous contrast. CONTRAST:  152m ISOVUE-300 IOPAMIDOL (ISOVUE-300) INJECTION 61% COMPARISON:  01/22/2006 CT of abdomen and pelvis. FINDINGS: Lower chest: Small bilateral pleural effusions and minor dependent atelectasis. Hepatobiliary: New nonspecific lucent well-circumscribed lesion dense liver segment 3 measuring 14 mm (series 2, image 20). Otherwise no focal liver lesion is identified. No intra or extrahepatic biliary ductal dilatation. Normal gallbladder. Pancreas: Unremarkable. No pancreatic ductal dilatation or surrounding inflammatory changes. Spleen: Normal in size without focal abnormality. Adrenals/Urinary Tract: Right kidney interpolar heterogeneous mass measuring 43 x 35  x 53 mm (AP x ML x CC), series 2, image 27 and series 5, image 105. Additionally in the right kidney upper pole there is a fluid attenuating lesion measuring 17 mm probably representing a cyst. No urinary stone disease or obstructive uropathy is identified. Bladder is unremarkable. Stomach/Bowel: Enteric tube with tip in the distal stomach noted. No obstructive or inflammatory changes of bowel identified. Nonvisualized appendix. Rectal tube. Vascular/Lymphatic: Aortic atherosclerosis. No  enlarged abdominal or pelvic lymph nodes. Reproductive: Prostate is unremarkable. Other: No abdominal wall hernia or abnormality. No abdominopelvic ascites. Musculoskeletal: No acute or significant osseous findings. IMPRESSION: 1. 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 Further characterization with renal protocol CT or MRI is recommended. No lymphadenopathy is identified. 2. Nonspecific 14 mm lucent lesion in liver segment 3, new from 2008. Attention at follow-up is recommended. 3. Small bilateral pleural effusions and minor dependent atelectasis at the lung bases. 4. Otherwise no acute process identified. These results will be called to the ordering clinician or representative by the Radiologist Assistant, and communication documented in the PACS or zVision Dashboard. Electronically Signed   By: Kristine Garbe M.D.   On: 11/01/2015 02:24   Dg Chest Port 1 View  Result Date: 11/01/2015 CLINICAL DATA:  Respiratory failure. EXAM: PORTABLE CHEST 1 VIEW COMPARISON:  10/31/2015. FINDINGS: Endotracheal tube and NG tube in stable position. Heart size stable. Low lung volumes with basilar atelectasis. Left base infiltrate. Tiny left pleural effusion cannot be excluded. IMPRESSION: 1. Lines and tubes in stable position. 2. Low lung volumes with basilar atelectasis. Left base infiltrate with small left pleural effusion noted. Electronically Signed   By: Marcello Moores  Register   On: 11/01/2015 07:01   Dg Chest Port 1 View  Result Date: 10/31/2015 CLINICAL DATA:  Respiratory failure, sepsis, mitral insufficiency. Intubated patient. EXAM: PORTABLE CHEST 1 VIEW COMPARISON:  Portable chest x-ray of October 30, 2015 FINDINGS: The patient is rotated toward the right. The lungs are reasonably well inflated. Mild interstitial prominence in the right lower lobe persists. There is no pneumothorax or pleural effusion. The heart is top-normal in size. The pulmonary vascularity is normal.  The endotracheal tube tip lies approximately 6 cm above the carina. The esophagogastric tube tip in proximal port lie below the GE junction. IMPRESSION: Stable appearance of the chest allowing for rotation. Subsegmental atelectasis in the left lower lobe persists. The support tubes are in reasonable position. Electronically Signed   By: David  Martinique M.D.   On: 10/31/2015 07:04   Assessment/Plan: [name] is a 60 yo male with PMHx of Mitral Valve Endocarditis complicated by MI and embolic CVA with hemorrhage in 2010, HTN, BPH and HLD who presented to the ED on 10/28 after his wife found him altered, lying on the bathroom floor one day following a Urologic procedure.   Sepsis 2/2 GBS Bacteremia: Patient continues to be febrile, but with decreasing leukocytosis from 24>16>13 today. TEE was negative for vegetations. Repeat BCx have been drawn and are pending. Patient was extubated this morning but remains agitated. CT abdomen/pelvis negative for abscess, but did reveal a 5.3 cm right renal mass concerning for malignancy. HIV negative. His bacteremia may be secondary to recent Urologic procedure; however, 40% of invasive GBS are without focus. Another hypothesis includes immunosuppression from a possible underlying renal cell carcinoma leading to susceptibility to GBS Bacteremia. It is less likely that the patient has an underlying immunodeficiency such as CVD as he would have presented at an  earlier age with recurrent infections such as pneumonia- not with two isolated bacteremic events separated by 7 years. However, further consideration for testing can be made as an outpatient. Overall, patient appears to be improving. If he continues to be febrile, would consider further imaging for work up. -Penicillin G 10/30 >> -BCx 10/28 >>GBS (Strep Agalactiae) -BCx 10/30 >> pending  Loose Stools: Patient continues to have loose stools with rectal tube in. Likely secondary to bacteremia and antibiotics. His  leukocytosis is improving. Given his presentation, I doubt C. Diff at this time.  -Continue to monitor  Right Renal Mass: Seen on CT abdomen/pelvis and concerning for malignancy. Wife requests that we do not tell patient at this time.  -Will need outpatient follow up  Martyn Malay, DO PGY-3 Internal Medicine Resident Pager # [phone] 11/01/2015 10:44 AM

## 2015-11-01 NOTE — Progress Notes (Signed)
Dr. Vaughan Browner notified for oral temp 102.8. Tylenol ordered as PO. Pt now NPO. Verbal orders given to have pharmacy to order IV Tylenol.

## 2015-11-01 NOTE — Procedures (Signed)
Extubation Procedure Note  Patient Details:   Name: [name] DOB: 12/21/1955 MRN: [id number]   Airway Documentation:     Evaluation  O2 sats: stable throughout Complications: No apparent complications Patient did tolerate procedure well. Bilateral Breath Sounds: Clear   Yes  Elsie Stain 11/01/2015, 9:18 AM

## 2015-11-01 NOTE — Progress Notes (Signed)
Pt resting comfortably   On Precedex  Extubated. Reviewed with nursing  Pt has had intermittent hypertension  Will give low dose hyydralazine 5 mg IV prn if BP consistently increaed over 190  Follow his needs over next 24 to 48 hours.

## 2015-11-01 NOTE — Progress Notes (Signed)
Pt weaned 5/5 and 40%. Pt had good volumes and vital signs were good. Pt became very agitated so the nurse saw the doctor and evaluated the Pt and said to extubate. RT extubated to 4L [place] with SATS 98%, RR 16,, HR 79. Leak test good and Pt was alert. RT will continue to monitor

## 2015-11-02 ENCOUNTER — Inpatient Hospital Stay (HOSPITAL_COMMUNITY): Payer: BLUE CROSS/BLUE SHIELD

## 2015-11-02 DIAGNOSIS — H9193 Unspecified hearing loss, bilateral: Secondary | ICD-10-CM

## 2015-11-02 DIAGNOSIS — N2889 Other specified disorders of kidney and ureter: Secondary | ICD-10-CM

## 2015-11-02 DIAGNOSIS — Z95828 Presence of other vascular implants and grafts: Secondary | ICD-10-CM

## 2015-11-02 DIAGNOSIS — A401 Sepsis due to streptococcus, group B: Principal | ICD-10-CM

## 2015-11-02 HISTORY — DX: Unspecified hearing loss, bilateral: H91.93

## 2015-11-02 LAB — CSF CELL COUNT WITH DIFFERENTIAL
Eosinophils, CSF: 1 % (ref 0–1)
LYMPHS CSF: 30 % — AB (ref 40–80)
MONOCYTE-MACROPHAGE-SPINAL FLUID: 28 % (ref 15–45)
RBC COUNT CSF: 5 /mm3 — AB
Segmented Neutrophils-CSF: 41 % — ABNORMAL HIGH (ref 0–6)
Tube #: 3
WBC CSF: 158 /mm3 — AB (ref 0–5)

## 2015-11-02 LAB — BLOOD GAS, ARTERIAL
ACID-BASE EXCESS: 1.7 mmol/L (ref 0.0–2.0)
Bicarbonate: 24.7 mmol/L (ref 20.0–28.0)
Drawn by: 42624
O2 CONTENT: 2 L/min
O2 SAT: 91.4 %
PATIENT TEMPERATURE: 98.6
PO2 ART: 59.1 mmHg — AB (ref 83.0–108.0)
pCO2 arterial: 31.8 mmHg — ABNORMAL LOW (ref 32.0–48.0)
pH, Arterial: 7.503 — ABNORMAL HIGH (ref 7.350–7.450)

## 2015-11-02 LAB — BASIC METABOLIC PANEL
ANION GAP: 9 (ref 5–15)
BUN: 18 mg/dL (ref 6–20)
CHLORIDE: 106 mmol/L (ref 101–111)
CO2: 23 mmol/L (ref 22–32)
Calcium: 8.4 mg/dL — ABNORMAL LOW (ref 8.9–10.3)
Creatinine, Ser: 0.77 mg/dL (ref 0.61–1.24)
GFR calc Af Amer: >60 mL/min (ref >60)
GFR calc non Af Amer: >60 mL/min (ref >60)
Glucose, Bld: 137 mg/dL — ABNORMAL HIGH (ref 65–99)
POTASSIUM: 3.9 mmol/L (ref 3.5–5.1)
SODIUM: 138 mmol/L (ref 135–145)

## 2015-11-02 LAB — GLUCOSE, CAPILLARY
GLUCOSE-CAPILLARY: 103 mg/dL — AB (ref 65–99)
GLUCOSE-CAPILLARY: 87 mg/dL (ref 65–99)
GLUCOSE-CAPILLARY: 92 mg/dL (ref 65–99)
Glucose-Capillary: 114 mg/dL — ABNORMAL HIGH (ref 65–99)
Glucose-Capillary: 122 mg/dL — ABNORMAL HIGH (ref 65–99)
Glucose-Capillary: 92 mg/dL (ref 65–99)

## 2015-11-02 LAB — PHOSPHORUS: PHOSPHORUS: 2.5 mg/dL (ref 2.5–4.6)

## 2015-11-02 LAB — CBC
HCT: 36.8 % — ABNORMAL LOW (ref 39.0–52.0)
HEMOGLOBIN: 12.6 g/dL — AB (ref 13.0–17.0)
MCH: 29.9 pg (ref 26.0–34.0)
MCHC: 34.2 g/dL (ref 30.0–36.0)
MCV: 87.2 fL (ref 78.0–100.0)
PLATELETS: 223 10*3/uL (ref 150–400)
RBC: 4.22 MIL/uL (ref 4.22–5.81)
RDW: 12.6 % (ref 11.5–15.5)
WBC: 11.9 10*3/uL — AB (ref 4.0–10.5)

## 2015-11-02 LAB — PROTEIN, CSF: Total  Protein, CSF: 130 mg/dL — ABNORMAL HIGH (ref 15–45)

## 2015-11-02 LAB — MAGNESIUM: MAGNESIUM: 1.9 mg/dL (ref 1.7–2.4)

## 2015-11-02 LAB — GLUCOSE, CSF: GLUCOSE CSF: 41 mg/dL (ref 40–70)

## 2015-11-02 MED ORDER — ORAL CARE MOUTH RINSE
15.0000 mL | Freq: Two times a day (BID) | OROMUCOSAL | Status: DC
  Administered 2015-11-02 – 2015-11-08 (×8): 15 mL via OROMUCOSAL

## 2015-11-02 MED ORDER — LIDOCAINE HCL (PF) 1 % IJ SOLN
5.0000 mL | Freq: Once | INTRAMUSCULAR | Status: AC
  Administered 2015-11-02 (×2): 5 mL via INTRADERMAL

## 2015-11-02 NOTE — Consult Note (Signed)
Urology Consult  Referring physician:   Dr. Dorris Carnes Reason for referral:  Right renal mass  Impression/Assessment: 1.  Incidental finding of  66m solid Right Renal mass, apparently unrelated to pt's current problem. CT reviewed with Radiology and with Dr. TPhebe Colla [id number] ( > 4cm <7cm); 75-90% 5 yr. survivorship with cytoreductive partial nephrectomy. Best approach would be Laparoscopic Robotic partial nephrectomy.          2. With respect to symptomatic BPH, pt is at risk for hypotension from alpha-blockers, and would avoid them for now. He was given a prescription for finasteride 545m 1/day for 6 months, to try to shrink his prostate ( 20%), to try to avoid surgical intervention. This may be restarted when pt is awake and alert, and able to tolerate po intake..      Plan:    I have discussed case with Dr. TePhebe CollaWill allow pt to fully recover from septic episode, and then see Dr. MaTresa Moores op to discuss surgical approach.   History of Present Illness:   89o male with  A hx of mitral valve prolapse, complicated by endocarditis and CVA in 2012, found by wife in altered mental state, 4 days post cystoscopy, and transrectal u/s for BPH evaluation ( covered with Cipro). He was incontinent of both bowel and bladder, ( see HPI), and was admitted to the ICU for intubation, and Rx for Group B strep bacteremia Rx.  Head and neck CT neg.    Abdominal CT, however, shows solid Right renal mass, and G-U consulted.   Past Medical History:  Diagnosis Date  . CVA (cerebral infarction)    hemmorhagic  CVA  . Dyslipidemia   . Endocarditis    MV Vegitation  . Mitral regurgitation   . [facility]   [facility]   Past Surgical History:  Procedure Laterality Date  . NO PAST SURGERIES      Medications:  Vanc  10/28 >> 10/30 Zosyn 10/28 >> 10/30 Pencillin 10/30 >>    Allergies: No Known Allergies  Family History  Problem Relation Age of Onset  . Heart disease Mother      mom RF as a child (died of heart problem in  4083    Social History:  reports that he has never smoked. He has never used smokeless tobacco. He reports that he does not drink alcohol or use drugs.  ROS: BPH, recent evaluation: frequency, urgency, weak stream, incomplete emptying.    Physical Exam:  Vital signs in last 24 hours: Temp:  [98.1 F (36.7 C)-102.8 F (39.3 C)] 99.2 F (37.3 C) (11/01 0400) Pulse Rate:  [56-94] 78 (11/01 0500) Resp:  [16-24] 20 (11/01 0500) BP: (139-188)/(65-166) 160/83 (11/01 0500) SpO2:  [94 %-100 %] 99 % (11/01 0500) FiO2 (%):  [40 %] 40 % (10/31 0853) Weight:  [94.3 kg (208 lb)] 94.3 kg (208 lb) (11/01 020488Physical Exam: extubaterd.  General:  Well-appearing man, agitated and intubated Neuro:  Moves all ext to pain but not command, pupils are reactive. HEENT: neck supple, Moist mucus membranes Cardiovascular:  RRR, Nl S1/S2, No MRG Lungs:  Clear, no wheeze or crackles Abdomen:  Soft, NT, ND and +BS. Musculoskeletal:  No apparent injuries to extremities Skin:  No rashes Recta;: NST. Prostate: 4+ lobular, benign.  Laboratory Data:  Results for orders placed or performed during the hospital encounter of 10/29/15 (from the past 72 hour(s))  Culture, respiratory (NON-Expectorated)     Status: None  Collection Time: 10/30/15 11:22 AM  Result Value Ref Range   Specimen Description TRACHEAL ASPIRATE    Special Requests NONE    Gram Stain      ABUNDANT WBC PRESENT,BOTH PMN AND MONONUCLEAR RARE SQUAMOUS EPITHELIAL CELLS PRESENT RARE GRAM POSITIVE COCCI IN PAIRS RARE GRAM POSITIVE RODS    Culture RARE CANDIDA ALBICANS    Report Status 11/01/2015 FINAL   Glucose, capillary     Status: Abnormal   Collection Time: 10/30/15 11:28 AM  Result Value Ref Range   Glucose-Capillary 118 (H) 65 - 99 mg/dL  Glucose, capillary     Status: Abnormal   Collection Time: 10/30/15  3:24 PM  Result Value Ref Range   Glucose-Capillary 138 (H) 65 - 99 mg/dL   Magnesium     Status: None   Collection Time: 10/30/15  5:12 PM  Result Value Ref Range   Magnesium 2.3 1.7 - 2.4 mg/dL  Phosphorus     Status: None   Collection Time: 10/30/15  5:12 PM  Result Value Ref Range   Phosphorus 2.9 2.5 - 4.6 mg/dL  Glucose, capillary     Status: Abnormal   Collection Time: 10/30/15  8:12 PM  Result Value Ref Range   Glucose-Capillary 142 (H) 65 - 99 mg/dL  Glucose, capillary     Status: Abnormal   Collection Time: 10/30/15 11:57 PM  Result Value Ref Range   Glucose-Capillary 127 (H) 65 - 99 mg/dL   Comment 1 Notify RN   BMET in AM     Status: Abnormal   Collection Time: 10/31/15  1:11 AM  Result Value Ref Range   Sodium 142 135 - 145 mmol/L   Potassium 3.5 3.5 - 5.1 mmol/L   Chloride 110 101 - 111 mmol/L   CO2 25 22 - 32 mmol/L   Glucose, Bld 123 (H) 65 - 99 mg/dL   BUN 25 (H) 6 - 20 mg/dL   Creatinine, Ser 1.07 0.61 - 1.24 mg/dL   Calcium 8.4 (L) 8.9 - 10.3 mg/dL   GFR calc non Af Amer >60 >60 mL/min   GFR calc Af Amer >60 >60 mL/min    Comment: (NOTE) The eGFR has been calculated using the CKD EPI equation. This calculation has not been validated in all clinical situations. eGFR's persistently <60 mL/min signify possible Chronic Kidney Disease.    Anion gap 7 5 - 15  CBC     Status: Abnormal   Collection Time: 10/31/15  1:11 AM  Result Value Ref Range   WBC 16.4 (H) 4.0 - 10.5 K/uL   RBC 4.26 4.22 - 5.81 MIL/uL   Hemoglobin 12.5 (L) 13.0 - 17.0 g/dL   HCT 38.9 (L) 39.0 - 52.0 %   MCV 91.3 78.0 - 100.0 fL   MCH 29.3 26.0 - 34.0 pg   MCHC 32.1 30.0 - 36.0 g/dL   RDW 13.7 11.5 - 15.5 %   Platelets 180 150 - 400 K/uL  Magnesium     Status: None   Collection Time: 10/31/15  1:11 AM  Result Value Ref Range   Magnesium 2.3 1.7 - 2.4 mg/dL  Phosphorus     Status: None   Collection Time: 10/31/15  1:11 AM  Result Value Ref Range   Phosphorus 2.7 2.5 - 4.6 mg/dL  Triglycerides     Status: Abnormal   Collection Time: 10/31/15  1:11 AM   Result Value Ref Range   Triglycerides 286 (H) <150 mg/dL  Blood gas, arterial  Status: Abnormal   Collection Time: 10/31/15  4:35 AM  Result Value Ref Range   FIO2 0.40    Delivery systems VENTILATOR    Mode PRESSURE REGULATED VOLUME CONTROL    VT 500 mL   LHR 15 resp/min   Peep/cpap 5.0 cm H20   pH, Arterial 7.437 7.350 - 7.450   pCO2 arterial 38.1 32.0 - 48.0 mmHg   pO2, Arterial 131 (H) 83.0 - 108.0 mmHg   Bicarbonate 25.3 20.0 - 28.0 mmol/L   Acid-Base Excess 1.5 0.0 - 2.0 mmol/L   O2 Saturation 98.6 %   Patient temperature 98.6    Collection site LEFT RADIAL    Drawn by [phone]    Sample type ARTERIAL DRAW    Allens test (pass/fail) PASS PASS  Glucose, capillary     Status: Abnormal   Collection Time: 10/31/15  7:24 AM  Result Value Ref Range   Glucose-Capillary 120 (H) 65 - 99 mg/dL   Comment 1 Capillary Specimen   Culture, blood (Routine X 2) w Reflex to ID Panel     Status: None (Preliminary result)   Collection Time: 10/31/15  9:28 AM  Result Value Ref Range   Specimen Description BLOOD RIGHT ARM    Special Requests IN PEDIATRIC BOTTLE 2CC    Culture NO GROWTH 1 DAY    Report Status PENDING   Culture, blood (Routine X 2) w Reflex to ID Panel     Status: None (Preliminary result)   Collection Time: 10/31/15  9:32 AM  Result Value Ref Range   Specimen Description BLOOD RIGHT HAND    Special Requests IN PEDIATRIC BOTTLE 3CC    Culture NO GROWTH 1 DAY    Report Status PENDING   Glucose, capillary     Status: Abnormal   Collection Time: 10/31/15  1:11 PM  Result Value Ref Range   Glucose-Capillary 106 (H) 65 - 99 mg/dL   Comment 1 Capillary Specimen   Glucose, capillary     Status: Abnormal   Collection Time: 10/31/15  4:06 PM  Result Value Ref Range   Glucose-Capillary 115 (H) 65 - 99 mg/dL   Comment 1 Capillary Specimen   HIV antibody     Status: None   Collection Time: 10/31/15  7:01 PM  Result Value Ref Range   HIV Screen 4th Generation wRfx Non  Reactive Non Reactive    Comment: (NOTE) Performed At: [facility] [place], [place] [id number] Lindon Romp MD ZD:[id number]   Glucose, capillary     Status: Abnormal   Collection Time: 10/31/15  8:12 PM  Result Value Ref Range   Glucose-Capillary 148 (H) 65 - 99 mg/dL   Comment 1 Capillary Specimen    Comment 2 Notify RN   Glucose, capillary     Status: Abnormal   Collection Time: 10/31/15 11:09 PM  Result Value Ref Range   Glucose-Capillary 125 (H) 65 - 99 mg/dL   Comment 1 Capillary Specimen    Comment 2 Notify RN   CBC     Status: Abnormal   Collection Time: 11/01/15  1:46 AM  Result Value Ref Range   WBC 13.4 (H) 4.0 - 10.5 K/uL   RBC 4.24 4.22 - 5.81 MIL/uL   Hemoglobin 12.6 (L) 13.0 - 17.0 g/dL   HCT 39.2 39.0 - 52.0 %   MCV 92.5 78.0 - 100.0 fL   MCH 29.7 26.0 - 34.0 pg   MCHC 32.1 30.0 - 36.0 g/dL   RDW  13.4 11.5 - 15.5 %   Platelets 184 150 - 400 K/uL  Basic metabolic panel     Status: Abnormal   Collection Time: 11/01/15  1:46 AM  Result Value Ref Range   Sodium 142 135 - 145 mmol/L   Potassium 4.0 3.5 - 5.1 mmol/L   Chloride 108 101 - 111 mmol/L   CO2 26 22 - 32 mmol/L   Glucose, Bld 123 (H) 65 - 99 mg/dL   BUN 23 (H) 6 - 20 mg/dL   Creatinine, Ser 1.02 0.61 - 1.24 mg/dL   Calcium 8.7 (L) 8.9 - 10.3 mg/dL   GFR calc non Af Amer >60 >60 mL/min   GFR calc Af Amer >60 >60 mL/min    Comment: (NOTE) The eGFR has been calculated using the CKD EPI equation. This calculation has not been validated in all clinical situations. eGFR's persistently <60 mL/min signify possible Chronic Kidney Disease.    Anion gap 8 5 - 15  Magnesium     Status: None   Collection Time: 11/01/15  1:46 AM  Result Value Ref Range   Magnesium 2.1 1.7 - 2.4 mg/dL  Phosphorus     Status: None   Collection Time: 11/01/15  1:46 AM  Result Value Ref Range   Phosphorus 3.3 2.5 - 4.6 mg/dL  Glucose, capillary     Status: Abnormal   Collection Time: 11/01/15   3:06 AM  Result Value Ref Range   Glucose-Capillary 126 (H) 65 - 99 mg/dL   Comment 1 Capillary Specimen    Comment 2 Notify RN   Blood gas, arterial     Status: Abnormal   Collection Time: 11/01/15  4:25 AM  Result Value Ref Range   FIO2 40.00    Delivery systems VENTILATOR    Mode PRESSURE REGULATED VOLUME CONTROL    VT 550 mL   LHR 15 resp/min   Peep/cpap 5.0 cm H20   pH, Arterial 7.436 7.350 - 7.450   pCO2 arterial 38.7 32.0 - 48.0 mmHg   pO2, Arterial 128 (H) 83.0 - 108.0 mmHg   Bicarbonate 25.3 20.0 - 28.0 mmol/L   Acid-Base Excess 1.7 0.0 - 2.0 mmol/L   O2 Saturation 98.4 %   Patient temperature 100.3    Collection site RIGHT RADIAL    Drawn by [phone]    Sample type ARTERIAL DRAW    Allens test (pass/fail) PASS PASS  Glucose, capillary     Status: Abnormal   Collection Time: 11/01/15  7:57 AM  Result Value Ref Range   Glucose-Capillary 142 (H) 65 - 99 mg/dL   Comment 1 Capillary Specimen   Glucose, capillary     Status: None   Collection Time: 11/01/15 11:27 AM  Result Value Ref Range   Glucose-Capillary 92 65 - 99 mg/dL   Comment 1 Capillary Specimen   Glucose, capillary     Status: None   Collection Time: 11/01/15  4:50 PM  Result Value Ref Range   Glucose-Capillary 93 65 - 99 mg/dL  Glucose, capillary     Status: Abnormal   Collection Time: 11/01/15  8:04 PM  Result Value Ref Range   Glucose-Capillary 108 (H) 65 - 99 mg/dL   Comment 1 Capillary Specimen    Comment 2 Notify RN   Glucose, capillary     Status: Abnormal   Collection Time: 11/02/15 12:12 AM  Result Value Ref Range   Glucose-Capillary 122 (H) 65 - 99 mg/dL   Comment 1 Capillary Specimen  Comment 2 Notify RN   Blood gas, arterial     Status: Abnormal   Collection Time: 11/02/15  4:39 AM  Result Value Ref Range   O2 Content 2.0 L/min   Delivery systems NASAL CANNULA    pH, Arterial 7.503 (H) 7.350 - 7.450   pCO2 arterial 31.8 (L) 32.0 - 48.0 mmHg   pO2, Arterial 59.1 (L) 83.0 - 108.0  mmHg   Bicarbonate 24.7 20.0 - 28.0 mmol/L   Acid-Base Excess 1.7 0.0 - 2.0 mmol/L   O2 Saturation 91.4 %   Patient temperature 98.6    Collection site RIGHT RADIAL    Drawn by [phone]    Sample type ARTERIAL DRAW    Allens test (pass/fail) PASS PASS  Glucose, capillary     Status: Abnormal   Collection Time: 11/02/15  5:00 AM  Result Value Ref Range   Glucose-Capillary 103 (H) 65 - 99 mg/dL   Comment 1 Capillary Specimen    Comment 2 Notify RN   Basic metabolic panel     Status: Abnormal   Collection Time: 11/02/15  5:24 AM  Result Value Ref Range   Sodium 138 135 - 145 mmol/L   Potassium 3.9 3.5 - 5.1 mmol/L   Chloride 106 101 - 111 mmol/L   CO2 23 22 - 32 mmol/L   Glucose, Bld 137 (H) 65 - 99 mg/dL   BUN 18 6 - 20 mg/dL   Creatinine, Ser 0.77 0.61 - 1.24 mg/dL   Calcium 8.4 (L) 8.9 - 10.3 mg/dL   GFR calc non Af Amer >60 >60 mL/min   GFR calc Af Amer >60 >60 mL/min    Comment: (NOTE) The eGFR has been calculated using the CKD EPI equation. This calculation has not been validated in all clinical situations. eGFR's persistently <60 mL/min signify possible Chronic Kidney Disease.    Anion gap 9 5 - 15  Magnesium     Status: None   Collection Time: 11/02/15  5:24 AM  Result Value Ref Range   Magnesium 1.9 1.7 - 2.4 mg/dL  Phosphorus     Status: None   Collection Time: 11/02/15  5:24 AM  Result Value Ref Range   Phosphorus 2.5 2.5 - 4.6 mg/dL  CBC     Status: Abnormal   Collection Time: 11/02/15  5:24 AM  Result Value Ref Range   WBC 11.9 (H) 4.0 - 10.5 K/uL   RBC 4.22 4.22 - 5.81 MIL/uL   Hemoglobin 12.6 (L) 13.0 - 17.0 g/dL   HCT 36.8 (L) 39.0 - 52.0 %   MCV 87.2 78.0 - 100.0 fL   MCH 29.9 26.0 - 34.0 pg   MCHC 34.2 30.0 - 36.0 g/dL   RDW 12.6 11.5 - 15.5 %   Platelets 223 150 - 400 K/uL   Recent Results (from the past 240 hour(s))  Urine culture     Status: None   Collection Time: 10/29/15  3:55 AM  Result Value Ref Range Status   Specimen Description URINE,  RANDOM  Final   Special Requests NONE  Final   Culture NO GROWTH  Final   Report Status 10/30/2015 FINAL  Final  Blood Culture (routine x 2)     Status: Abnormal   Collection Time: 10/29/15  4:00 AM  Result Value Ref Range Status   Specimen Description BLOOD LEFT ARM  Final   Special Requests BOTTLES DRAWN AEROBIC AND ANAEROBIC 5ML  Final   Culture  Setup Time   Final  GRAM POSITIVE COCCI IN CHAINS IN BOTH AEROBIC AND ANAEROBIC BOTTLES CRITICAL RESULT CALLED TO, READ BACK BY AND VERIFIED WITH: R RUMBARGER,PHARMD AT 4128 10/29/15 BY L BENFIELD    Culture GROUP B STREP(S.AGALACTIAE)ISOLATED (A)  Final   Report Status 10/31/2015 FINAL  Final   Organism ID, Bacteria GROUP B STREP(S.AGALACTIAE)ISOLATED  Final      Susceptibility   Group b strep(s.agalactiae)isolated - MIC*    CLINDAMYCIN <=0.25 SENSITIVE Sensitive     AMPICILLIN <=0.25 SENSITIVE Sensitive     ERYTHROMYCIN <=0.12 SENSITIVE Sensitive     VANCOMYCIN 0.5 SENSITIVE Sensitive     CEFTRIAXONE <=0.12 SENSITIVE Sensitive     LEVOFLOXACIN 1 SENSITIVE Sensitive     * GROUP B STREP(S.AGALACTIAE)ISOLATED  Blood Culture ID Panel (Reflexed)     Status: Abnormal   Collection Time: 10/29/15  4:00 AM  Result Value Ref Range Status   Enterococcus species NOT DETECTED NOT DETECTED Final   Listeria monocytogenes NOT DETECTED NOT DETECTED Final   Staphylococcus species NOT DETECTED NOT DETECTED Final   Staphylococcus aureus NOT DETECTED NOT DETECTED Final   Streptococcus species DETECTED (A) NOT DETECTED Final    Comment: CRITICAL RESULT CALLED TO, READ BACK BY AND VERIFIED WITH: R RUMBARGER,PHARMD AT 1541 10/29/15 BY L BENFIELD    Streptococcus agalactiae DETECTED (A) NOT DETECTED Final    Comment: CRITICAL RESULT CALLED TO, READ BACK BY AND VERIFIED WITH: R RUMBARGER,PHARMD AT 1541 10/29/15 BY L BENFIELD    Streptococcus pneumoniae NOT DETECTED NOT DETECTED Final   Streptococcus pyogenes NOT DETECTED NOT DETECTED Final    Acinetobacter baumannii NOT DETECTED NOT DETECTED Final   Enterobacteriaceae species NOT DETECTED NOT DETECTED Final   Enterobacter cloacae complex NOT DETECTED NOT DETECTED Final   Escherichia coli NOT DETECTED NOT DETECTED Final   Klebsiella oxytoca NOT DETECTED NOT DETECTED Final   Klebsiella pneumoniae NOT DETECTED NOT DETECTED Final   Proteus species NOT DETECTED NOT DETECTED Final   Serratia marcescens NOT DETECTED NOT DETECTED Final   Haemophilus influenzae NOT DETECTED NOT DETECTED Final   Neisseria meningitidis NOT DETECTED NOT DETECTED Final   Pseudomonas aeruginosa NOT DETECTED NOT DETECTED Final   Candida albicans NOT DETECTED NOT DETECTED Final   Candida glabrata NOT DETECTED NOT DETECTED Final   Candida krusei NOT DETECTED NOT DETECTED Final   Candida parapsilosis NOT DETECTED NOT DETECTED Final   Candida tropicalis NOT DETECTED NOT DETECTED Final  Blood Culture (routine x 2)     Status: Abnormal   Collection Time: 10/29/15  4:15 AM  Result Value Ref Range Status   Specimen Description BLOOD LEFT ARM  Final   Special Requests IN PEDIATRIC BOTTLE 2ML  Final   Culture  Setup Time   Final    GRAM POSITIVE COCCI IN CHAINS IN PEDIATRIC BOTTLE CRITICAL RESULT CALLED TO, READ BACK BY AND VERIFIED WITH: R RUMBARGER,PHARMD AT 1541 10/29/15 BY L BENFIELD    Culture (A)  Final    GROUP B STREP(S.AGALACTIAE)ISOLATED SUSCEPTIBILITIES PERFORMED ON PREVIOUS CULTURE WITHIN THE LAST 5 DAYS.    Report Status 10/31/2015 FINAL  Final  MRSA PCR Screening     Status: None   Collection Time: 10/29/15  6:39 AM  Result Value Ref Range Status   MRSA by PCR NEGATIVE NEGATIVE Final    Comment:        The GeneXpert MRSA Assay (FDA approved for NASAL specimens only), is one component of a comprehensive MRSA colonization surveillance program. It is not intended to diagnose MRSA  infection nor to guide or monitor treatment for MRSA infections.   Respiratory Panel by PCR     Status: None    Collection Time: 10/29/15 11:15 AM  Result Value Ref Range Status   Adenovirus NOT DETECTED NOT DETECTED Final   Coronavirus 229E NOT DETECTED NOT DETECTED Final   Coronavirus HKU1 NOT DETECTED NOT DETECTED Final   Coronavirus NL63 NOT DETECTED NOT DETECTED Final   Coronavirus OC43 NOT DETECTED NOT DETECTED Final   Metapneumovirus NOT DETECTED NOT DETECTED Final   Rhinovirus / Enterovirus NOT DETECTED NOT DETECTED Final   Influenza A NOT DETECTED NOT DETECTED Final   Influenza B NOT DETECTED NOT DETECTED Final   Parainfluenza Virus 1 NOT DETECTED NOT DETECTED Final   Parainfluenza Virus 2 NOT DETECTED NOT DETECTED Final   Parainfluenza Virus 3 NOT DETECTED NOT DETECTED Final   Parainfluenza Virus 4 NOT DETECTED NOT DETECTED Final   Respiratory Syncytial Virus NOT DETECTED NOT DETECTED Final   Bordetella pertussis NOT DETECTED NOT DETECTED Final   Chlamydophila pneumoniae NOT DETECTED NOT DETECTED Final   Mycoplasma pneumoniae NOT DETECTED NOT DETECTED Final  Culture, respiratory (NON-Expectorated)     Status: None   Collection Time: 10/30/15 11:22 AM  Result Value Ref Range Status   Specimen Description TRACHEAL ASPIRATE  Final   Special Requests NONE  Final   Gram Stain   Final    ABUNDANT WBC PRESENT,BOTH PMN AND MONONUCLEAR RARE SQUAMOUS EPITHELIAL CELLS PRESENT RARE GRAM POSITIVE COCCI IN PAIRS RARE GRAM POSITIVE RODS    Culture RARE CANDIDA ALBICANS  Final   Report Status 11/01/2015 FINAL  Final  Culture, blood (Routine X 2) w Reflex to ID Panel     Status: None (Preliminary result)   Collection Time: 10/31/15  9:28 AM  Result Value Ref Range Status   Specimen Description BLOOD RIGHT ARM  Final   Special Requests IN PEDIATRIC BOTTLE 2CC  Final   Culture NO GROWTH 1 DAY  Final   Report Status PENDING  Incomplete  Culture, blood (Routine X 2) w Reflex to ID Panel     Status: None (Preliminary result)   Collection Time: 10/31/15  9:32 AM  Result Value Ref Range Status    Specimen Description BLOOD RIGHT HAND  Final   Special Requests IN PEDIATRIC BOTTLE 3CC  Final   Culture NO GROWTH 1 DAY  Final   Report Status PENDING  Incomplete   Creatinine:  Recent Labs  10/29/15 0355 10/29/15 0845 10/30/15 0446 10/31/15 0111 11/01/15 0146 11/02/15 0524  CREATININE 1.24 1.19 1.24 1.07 1.02 0.77   Study Result   CLINICAL DATA:  59 y/o  M; bacteremia.  EXAM: CT ABDOMEN AND PELVIS WITH CONTRAST  TECHNIQUE: Multidetector CT imaging of the abdomen and pelvis was performed using the standard protocol following bolus administration of intravenous contrast.  CONTRAST:  112m ISOVUE-300 IOPAMIDOL (ISOVUE-300) INJECTION 61%  COMPARISON:  01/22/2006 CT of abdomen and pelvis.  FINDINGS: Lower chest: Small bilateral pleural effusions and minor dependent atelectasis.  Hepatobiliary: New nonspecific lucent well-circumscribed lesion dense liver segment 3 measuring 14 mm (series 2, image 20). Otherwise no focal liver lesion is identified. No intra or extrahepatic biliary ductal dilatation. Normal gallbladder.  Pancreas: Unremarkable. No pancreatic ductal dilatation or surrounding inflammatory changes.  Spleen: Normal in size without focal abnormality.  Adrenals/Urinary Tract: Right kidney interpolar heterogeneous mass measuring 43 x 35 x 53 mm (AP x ML x CC), series 2, image 27 and series 5, image 105. Additionally  in the right kidney upper pole there is a fluid attenuating lesion measuring 17 mm probably representing a cyst. No urinary stone disease or obstructive uropathy is identified. Bladder is unremarkable.  Stomach/Bowel: Enteric tube with tip in the distal stomach noted. No obstructive or inflammatory changes of bowel identified. Nonvisualized appendix. Rectal tube.  Vascular/Lymphatic: Aortic atherosclerosis. No enlarged abdominal or pelvic lymph nodes.  Reproductive: Prostate is unremarkable.  Other: No abdominal wall hernia  or abnormality. No abdominopelvic ascites.  Musculoskeletal: No acute or significant osseous findings.  IMPRESSION: 1. 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 Further characterization with renal protocol CT or MRI is recommended. No lymphadenopathy is identified. 2. Nonspecific 14 mm lucent lesion in liver segment 3, new from 2008. Attention at follow-up is recommended. 3. Small bilateral pleural effusions and minor dependent atelectasis at the lung bases. 4. Otherwise no acute process identified. These results will be called to the ordering clinician or representative by the Radiologist Assistant, and communication documented in the PACS or zVision Dashboard.   Electronically Signed   By: Kristine Garbe M.D.   On: 11/01/2015 02:24       Teller Wakefield I Suede [place] 11/02/2015, 8:00 AM

## 2015-11-02 NOTE — Progress Notes (Signed)
Subjective: Patient agitated and encephalopathic.  Moving all extremities. Continues to have elevated WBC and fever. concerned for possible seeding to CSF.  HE is so agitated and moving around this WILL NEED TO BE DONE UNDER FLOURO.   Exam: Vitals:   11/02/15 0500 11/02/15 0930  BP: (!) 160/83   Pulse: 78   Resp: 20   Temp:  98.8 F (37.1 C)       Gen: In bed, NAD MS: alert, agitated, confused, follows simple commands.  CN: 2-12 intact Motor: MAEW but right arm less than left Sensory:intact   Pertinent Labs/Diagnostics: WBC 11.9 Temp 98.4 MRI pending   Impression: 60 YO male with sepsis with some improvement but not significant. Fear for possible seeing to [facility]. EEG showed no epileptiform activity. Will order LP under Flouro due to danger of doing it at bedside. Will need sedation while getting LP. Will also obtain MRI to evaluate for shower of emboli --though less likely   Etta Quill PA-C [facility] [phone]  M-F  (am- 4 PM)  11/02/2015, 10:52 AM    11/02/2015, 10:45 AM

## 2015-11-02 NOTE — Progress Notes (Signed)
[name]   Reason for visit: Follow up on GBS bacteremia  Interval History: TEE negative for vegetation; repeat blood cultures ngtd; less encephalopathic and alert and oriented per nursing  CXR independently reviewed and some edema/vascularity  Physical Exam: Constitutional:  Vitals:   11/02/15 0500 11/02/15 0930  BP: (!) 160/83   Pulse: 78   Resp: 20   Temp:  98.8 F (37.1 C)   patient appears in NAD, sleepy but arousable Eyes: anicteric HENT: no thrush Respiratory: Normal respiratory effort; CTA B Cardiovascular: RRR GI: soft, nt, nd  Review of Systems: Constitutional: negative for fatigue and malaise Gastrointestinal: negative for diarrhea Musculoskeletal: negative for myalgias and arthralgias  Lab Results  Component Value Date   WBC 11.9 (H) 11/02/2015   HGB 12.6 (L) 11/02/2015   HCT 36.8 (L) 11/02/2015   MCV 87.2 11/02/2015   PLT 223 11/02/2015    Lab Results  Component Value Date   CREATININE 0.77 11/02/2015   BUN 18 11/02/2015   NA 138 11/02/2015   K 3.9 11/02/2015   CL 106 11/02/2015   CO2 23 11/02/2015    Lab Results  Component Value Date   ALT 35 10/29/2015   AST 35 10/29/2015   ALKPHOS 85 10/29/2015     Microbiology: Recent Results (from the past 240 hour(s))  Urine culture     Status: None   Collection Time: 10/29/15  3:55 AM  Result Value Ref Range Status   Specimen Description URINE, RANDOM  Final   Special Requests NONE  Final   Culture NO GROWTH  Final   Report Status 10/30/2015 FINAL  Final  Blood Culture (routine x 2)     Status: Abnormal   Collection Time: 10/29/15  4:00 AM  Result Value Ref Range Status   Specimen Description BLOOD LEFT ARM  Final   Special Requests BOTTLES DRAWN AEROBIC AND ANAEROBIC 5ML  Final   Culture  Setup Time   Final    GRAM POSITIVE COCCI IN CHAINS IN BOTH AEROBIC AND ANAEROBIC BOTTLES CRITICAL RESULT CALLED TO, READ BACK BY AND VERIFIED WITH: R RUMBARGER,PHARMD AT  1541 10/29/15 BY L BENFIELD    Culture GROUP B STREP(S.AGALACTIAE)ISOLATED (A)  Final   Report Status 10/31/2015 FINAL  Final   Organism ID, Bacteria GROUP B STREP(S.AGALACTIAE)ISOLATED  Final      Susceptibility   Group b strep(s.agalactiae)isolated - MIC*    CLINDAMYCIN <=0.25 SENSITIVE Sensitive     AMPICILLIN <=0.25 SENSITIVE Sensitive     ERYTHROMYCIN <=0.12 SENSITIVE Sensitive     VANCOMYCIN 0.5 SENSITIVE Sensitive     CEFTRIAXONE <=0.12 SENSITIVE Sensitive     LEVOFLOXACIN 1 SENSITIVE Sensitive     * GROUP B STREP(S.AGALACTIAE)ISOLATED  Blood Culture ID Panel (Reflexed)     Status: Abnormal   Collection Time: 10/29/15  4:00 AM  Result Value Ref Range Status   Enterococcus species NOT DETECTED NOT DETECTED Final   Listeria monocytogenes NOT DETECTED NOT DETECTED Final   Staphylococcus species NOT DETECTED NOT DETECTED Final   Staphylococcus aureus NOT DETECTED NOT DETECTED Final   Streptococcus species DETECTED (A) NOT DETECTED Final    Comment: CRITICAL RESULT CALLED TO, READ BACK BY AND VERIFIED WITH: R RUMBARGER,PHARMD AT 1541 10/29/15 BY L BENFIELD    Streptococcus agalactiae DETECTED (A) NOT DETECTED Final    Comment: CRITICAL RESULT CALLED TO, READ BACK BY AND VERIFIED WITH: R RUMBARGER,PHARMD AT 1541 10/29/15 BY L BENFIELD    Streptococcus pneumoniae NOT  DETECTED NOT DETECTED Final   Streptococcus pyogenes NOT DETECTED NOT DETECTED Final   Acinetobacter baumannii NOT DETECTED NOT DETECTED Final   Enterobacteriaceae species NOT DETECTED NOT DETECTED Final   Enterobacter cloacae complex NOT DETECTED NOT DETECTED Final   Escherichia coli NOT DETECTED NOT DETECTED Final   Klebsiella oxytoca NOT DETECTED NOT DETECTED Final   Klebsiella pneumoniae NOT DETECTED NOT DETECTED Final   Proteus species NOT DETECTED NOT DETECTED Final   Serratia marcescens NOT DETECTED NOT DETECTED Final   Haemophilus influenzae NOT DETECTED NOT DETECTED Final   Neisseria meningitidis NOT  DETECTED NOT DETECTED Final   Pseudomonas aeruginosa NOT DETECTED NOT DETECTED Final   Candida albicans NOT DETECTED NOT DETECTED Final   Candida glabrata NOT DETECTED NOT DETECTED Final   Candida krusei NOT DETECTED NOT DETECTED Final   Candida parapsilosis NOT DETECTED NOT DETECTED Final   Candida tropicalis NOT DETECTED NOT DETECTED Final  Blood Culture (routine x 2)     Status: Abnormal   Collection Time: 10/29/15  4:15 AM  Result Value Ref Range Status   Specimen Description BLOOD LEFT ARM  Final   Special Requests IN PEDIATRIC BOTTLE 2ML  Final   Culture  Setup Time   Final    GRAM POSITIVE COCCI IN CHAINS IN PEDIATRIC BOTTLE CRITICAL RESULT CALLED TO, READ BACK BY AND VERIFIED WITH: R RUMBARGER,PHARMD AT 1541 10/29/15 BY L BENFIELD    Culture (A)  Final    GROUP B STREP(S.AGALACTIAE)ISOLATED SUSCEPTIBILITIES PERFORMED ON PREVIOUS CULTURE WITHIN THE LAST 5 DAYS.    Report Status 10/31/2015 FINAL  Final  MRSA PCR Screening     Status: None   Collection Time: 10/29/15  6:39 AM  Result Value Ref Range Status   MRSA by PCR NEGATIVE NEGATIVE Final    Comment:        The GeneXpert MRSA Assay (FDA approved for NASAL specimens only), is one component of a comprehensive MRSA colonization surveillance program. It is not intended to diagnose MRSA infection nor to guide or monitor treatment for MRSA infections.   Respiratory Panel by PCR     Status: None   Collection Time: 10/29/15 11:15 AM  Result Value Ref Range Status   Adenovirus NOT DETECTED NOT DETECTED Final   Coronavirus 229E NOT DETECTED NOT DETECTED Final   Coronavirus HKU1 NOT DETECTED NOT DETECTED Final   Coronavirus NL63 NOT DETECTED NOT DETECTED Final   Coronavirus OC43 NOT DETECTED NOT DETECTED Final   Metapneumovirus NOT DETECTED NOT DETECTED Final   Rhinovirus / Enterovirus NOT DETECTED NOT DETECTED Final   Influenza A NOT DETECTED NOT DETECTED Final   Influenza B NOT DETECTED NOT DETECTED Final    Parainfluenza Virus 1 NOT DETECTED NOT DETECTED Final   Parainfluenza Virus 2 NOT DETECTED NOT DETECTED Final   Parainfluenza Virus 3 NOT DETECTED NOT DETECTED Final   Parainfluenza Virus 4 NOT DETECTED NOT DETECTED Final   Respiratory Syncytial Virus NOT DETECTED NOT DETECTED Final   Bordetella pertussis NOT DETECTED NOT DETECTED Final   Chlamydophila pneumoniae NOT DETECTED NOT DETECTED Final   Mycoplasma pneumoniae NOT DETECTED NOT DETECTED Final  Culture, respiratory (NON-Expectorated)     Status: None   Collection Time: 10/30/15 11:22 AM  Result Value Ref Range Status   Specimen Description TRACHEAL ASPIRATE  Final   Special Requests NONE  Final   Gram Stain   Final    ABUNDANT WBC PRESENT,BOTH PMN AND MONONUCLEAR RARE SQUAMOUS EPITHELIAL CELLS PRESENT RARE GRAM POSITIVE COCCI  IN PAIRS RARE GRAM POSITIVE RODS    Culture RARE CANDIDA ALBICANS  Final   Report Status 11/01/2015 FINAL  Final  Culture, blood (Routine X 2) w Reflex to ID Panel     Status: None (Preliminary result)   Collection Time: 10/31/15  9:28 AM  Result Value Ref Range Status   Specimen Description BLOOD RIGHT ARM  Final   Special Requests IN PEDIATRIC BOTTLE 2CC  Final   Culture NO GROWTH 1 DAY  Final   Report Status PENDING  Incomplete  Culture, blood (Routine X 2) w Reflex to ID Panel     Status: None (Preliminary result)   Collection Time: 10/31/15  9:32 AM  Result Value Ref Range Status   Specimen Description BLOOD RIGHT HAND  Final   Special Requests IN PEDIATRIC BOTTLE 3CC  Final   Culture NO GROWTH 1 DAY  Final   Report Status PENDING  Incomplete    Impression/Plan:  1. GBS bacteremia - fortunately TEE negative for vegetation.  On penicillin and improving.  Will need IV treatment for 2 weeks total from negative blood culture 10/30 (if it remains negative) through November 12th.   Will need picc line once blood culture 10/30 negative at 72 hours Ok to pull picc line after treatment completion on  November 12th Labs per home health protocol or SNF protocol Timing of urologic procedure most likely due to timing but unclear why recurrent x 2.   Will continue to follow blood culture  2. Renal mass - urology following and will need surgery as outpatient  3.  AMS - improving, likely sepsis related and slowly improving.

## 2015-11-02 NOTE — Progress Notes (Signed)
CRITICAL VALUE ALERT  Critical value received:  CSF WBC 158   Date of notification:  11/02/2015  Time of notification:  [id number]  Critical value read back:Yes.    Nurse who received alert:  Guadalupe Dawn  MD notified (1st page): Luellen Pucker RN at [facility] [employer] [employer] notified for Dr. Leonidas Romberg

## 2015-11-02 NOTE — Progress Notes (Signed)
SLP Cancellation Note  Patient Details Name: [name] MRN: [id number] DOB: 1955/01/23   Cancelled treatment:       Reason Eval/Treat Not Completed: Patient at procedure or test/unavailable.  Will f/u next date.   [name] 11/02/2015, 2:20 PM

## 2015-11-02 NOTE — Progress Notes (Signed)
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 In the ED he was found to be febrile and combative and required intubation. Two days prior to his presentation he had a urologic procedure done (details unclear), but it was a trans-urethral and trans-rectal approach to address his prostatic hypertrophy. He was given a dose of cipro to take the afternoon of his procedure, and according to his wife seemed to be well for the day following his procedure. They went to bed the evening prior to his presentation without incident, but his wife found him lying on the bathroom floor, having been incontinent of bowel and bladder. However, he had taken his pants off and was in the bathroom and appeared to have been attempting to intentionally move his bowels and void on the toilet but fell over before he could be seated on the toilet. She found him down, awake, but not lucid.  SUBJECTIVE:  Extubated yesterday. Required precedex for agitation, delirium. Haldol PRN. Much more alert today AM.  VITAL SIGNS: BP (!) 160/83   Pulse 78   Temp 99.2 F (37.3 C) (Oral)   Resp 20   Ht 5\' 8"  (1.727 m)   Wt 208 lb (94.3 kg)   SpO2 99%   BMI 31.63 kg/m   HEMODYNAMICS:   VENTILATOR SETTINGS: Vent Mode: CPAP FiO2 (%):  [40 %] 40 % PEEP:  [5 cmH20] 5 cmH20 Pressure Support:  [5 cmH20] 5 cmH20  INTAKE / OUTPUT: I/O last 3 completed shifts: In: 4923.4 [I.V.:1273.4; NG/GT:1300; IV [id number] Out: 2275 [id number]; Stool:300]  PHYSICAL EXAMINATION: General:  Well-appearing man, No distress Neuro:  Moves all ext to pain but not command, pupils are reactive. HEENT: Neck supple, Moist mucus membranes Cardiovascular:  RRR,  Nl S1/S2, No MRG Lungs:  Clear, no wheeze or crackles Abdomen:  Soft, NT, ND and +BS. Musculoskeletal:  No apparent injuries to extremities Skin:  No rashes  LABS:  BMET  Recent Labs Lab 10/31/15 0111 11/01/15 0146 11/02/15 0524  NA 142 142 138  K 3.5 4.0 3.9  CL 110 108 106  CO2 25 26 23   BUN 25* 23* 18  CREATININE 1.07 1.02 0.77  GLUCOSE 123* 123* 137*   Electrolytes  Recent Labs Lab 10/31/15 0111 11/01/15 0146 11/02/15 0524  CALCIUM 8.4* 8.7* 8.4*  MG 2.3 2.1 1.9  PHOS 2.7 3.3 2.5   CBC  Recent Labs Lab 10/31/15 0111 11/01/15 0146 11/02/15 0524  WBC 16.4* 13.4* 11.9*  HGB 12.5* 12.6* 12.6*  HCT 38.9* 39.2 36.8*  PLT 180 184 223   Coag's  Recent Labs Lab 10/29/15 0355  APTT 25  INR 1.08   Sepsis Markers  Recent Labs Lab 10/29/15 0417 10/29/15 1127 10/30/15 0446  LATICACIDVEN 3.20* 3.0* 1.7   ABG  Recent Labs Lab 10/31/15 0435 11/01/15 0425 11/02/15 0439  PHART 7.437 7.436 7.503*  PCO2ART 38.1 38.7 31.8*  PO2ART 131* 128* 59.1*   Liver Enzymes  Recent Labs Lab 10/29/15 0355  AST 35  ALT 35  ALKPHOS 85  BILITOT 1.1  ALBUMIN 4.6   Cardiac Enzymes  Recent Labs Lab 10/29/15 1117 10/29/15 1644 10/29/15 2331  TROPONINI 0.10* 0.10* 0.08*  Glucose  Recent Labs Lab 11/01/15 1127 11/01/15 1650 11/01/15 2004 11/02/15 0012 11/02/15 0500 11/02/15 0816  GLUCAP 92 93 108* 122* 103* 87   Imaging Dg Chest Port 1 View  Result Date: 11/02/2015 CLINICAL DATA:  60 year old male with respiratory failure. Initial encounter. EXAM: PORTABLE CHEST 1 VIEW COMPARISON:  Portable chest radiographs 11/01/2015 and earlier. CT Abdomen and Pelvis 11/01/2015 FINDINGS: Portable AP semi upright view at 0459 hours. Extubated and enteric tube removed. Stable to mildly improved lung volumes. Improved left lung base ventilation with improved visualization of the left hemidiaphragm. Stable cardiomegaly and mediastinal contours. Increased pulmonary  vascularity since yesterday without overt edema. No pneumothorax. The small pleural effusions seen by CT Abdomen and Pelvis yesterday are not evident. IMPRESSION: 1. Extubated and enteric tube removed. 2. Improved left lung base ventilation. Increased pulmonary vascularity without overt edema. Small bilateral pleural effusions seen by CT Abdomen and Pelvis yesterday are not evident. Electronically Signed   By: Genevie Ann M.D.   On: 11/02/2015 07:29   STUDIES:  Head CT >> negative Neck CT >> negative  CULTURES: Blood 10/28 >> Panel +Strep species/agalactiae >> RVP neg 10/28   ANTIBIOTICS: Vanc  10/28 >> 10/30 Zosyn 10/28 >> 10/30 Pencillin 10/30 >>  SIGNIFICANT EVENTS: 10/28>>>Intubated in the ED for agitation  LINES/TUBES: ETT 7.5 mm 10/28 >> 10/31 PIV x2 Condom cath OGT  DISCUSSION: 60 y/o p/w sepsis, group B strep bacteremia. Incidental findings of renal mass suspicious for malignancy.  ASSESSMENT / PLAN:  PULMONARY A: Need for mechanical ventilation > extubated P:   No ussues  CARDIOVASCULAR A:  HLD Hx of MV endocarditis w/ MI /CVA 2010 Troponin bump .05>.10>.10>.08 ? Demand ischemia w/ sepsis  P:  Continue Crestor. PRN hydralazine for HTN He may need lasix today for vascular congestion on CXR. Will defer to cardiology.  RENAL A:   Lactic acidosis>LA trend down  Hypomagnesium >replaced  Hypokalemia >replaced  CT abd/pelvis noted with renal mass. Will need follow up and biopsy. P:   Due to sepsis BMET in AM Replace electrolytes as indicated. Urology plans on biopsy as outpatient. Family does not want him to know about the kidney mass yet.  GASTROINTESTINAL A:  Nutrition  P:   Keep NPO Swallow eval today. PPI  HEMATOLOGIC A:   No active issues P:  Transfuse per ICU protocol.  INFECTIOUS A:   Severe sepsis w/ strep bacteremia ? Urological procedure on 10/25 [facility] panel +strep , RVP neg  Hx of endocarditis w/ CVA/MI in 2010  No vegetations on TEE  10/31 P:   Follow blood cultures.  Abx changed to pencillin ID on board.  ENDOCRINE A:   No active issues   P:   Monitor  NEUROLOGIC A:   AMS most likely d/t sepsis   Hx of stroke Neck is supple, doubt meningitis -neuro consult 10/28 , MRI if mentation does not improve or worsens P:   RASS goal: 0 Off sedation now. Will use precedex, haldol if needed for agitation  FAMILY  - Updates: Wife updated daily. - Inter-disciplinary family meet or Palliative Care meeting due by:  11/05/15  Critical care time- 35 mins.  Marshell Garfinkel MD [facility] Pager [phone] If no answer or after 3pm call: [phone] 11/02/2015, 8:31 AM

## 2015-11-03 ENCOUNTER — Inpatient Hospital Stay (HOSPITAL_COMMUNITY): Payer: BLUE CROSS/BLUE SHIELD

## 2015-11-03 DIAGNOSIS — H60501 Unspecified acute noninfective otitis externa, right ear: Secondary | ICD-10-CM

## 2015-11-03 DIAGNOSIS — G039 Meningitis, unspecified: Secondary | ICD-10-CM

## 2015-11-03 LAB — MAGNESIUM: Magnesium: 2 mg/dL (ref 1.7–2.4)

## 2015-11-03 LAB — BASIC METABOLIC PANEL
ANION GAP: 11 (ref 5–15)
BUN: 17 mg/dL (ref 6–20)
CALCIUM: 8.5 mg/dL — AB (ref 8.9–10.3)
CO2: 21 mmol/L — AB (ref 22–32)
CREATININE: 0.83 mg/dL (ref 0.61–1.24)
Chloride: 103 mmol/L (ref 101–111)
GFR calc Af Amer: >60 mL/min (ref >60)
GLUCOSE: 96 mg/dL (ref 65–99)
Potassium: 3.4 mmol/L — ABNORMAL LOW (ref 3.5–5.1)
Sodium: 135 mmol/L (ref 135–145)

## 2015-11-03 LAB — PHOSPHORUS: Phosphorus: 2.6 mg/dL (ref 2.5–4.6)

## 2015-11-03 LAB — CBC
HCT: 40.2 % (ref 39.0–52.0)
Hemoglobin: 13.9 g/dL (ref 13.0–17.0)
MCH: 29.5 pg (ref 26.0–34.0)
MCHC: 34.6 g/dL (ref 30.0–36.0)
MCV: 85.4 fL (ref 78.0–100.0)
PLATELETS: 257 10*3/uL (ref 150–400)
RBC: 4.71 MIL/uL (ref 4.22–5.81)
RDW: 12.3 % (ref 11.5–15.5)
WBC: 12.3 10*3/uL — AB (ref 4.0–10.5)

## 2015-11-03 LAB — GLUCOSE, CAPILLARY: GLUCOSE-CAPILLARY: 105 mg/dL — AB (ref 65–99)

## 2015-11-03 LAB — HERPES SIMPLEX VIRUS(HSV) DNA BY PCR
HSV 1 DNA: NEGATIVE
HSV 2 DNA: NEGATIVE

## 2015-11-03 MED ORDER — HYDRALAZINE HCL 20 MG/ML IJ SOLN
10.0000 mg | Freq: Once | INTRAMUSCULAR | Status: AC
  Administered 2015-11-03: 10 mg via INTRAVENOUS
  Filled 2015-11-03: qty 1

## 2015-11-03 MED ORDER — HYDRALAZINE HCL 20 MG/ML IJ SOLN: 10.0000 mg | Freq: Four times a day (QID) | INTRAMUSCULAR | Status: DC | PRN

## 2015-11-03 MED ORDER — CIPROFLOXACIN-DEXAMETHASONE 0.3-0.1 % OT SUSP
4.0000 [drp] | Freq: Two times a day (BID) | OTIC | Status: DC
  Administered 2015-11-03 – 2015-11-08 (×12): 4 [drp] via OTIC
  Filled 2015-11-03 (×3): qty 7.5

## 2015-11-03 MED ORDER — ENOXAPARIN SODIUM 40 MG/0.4ML ~~LOC~~ SOLN
40.0000 mg | SUBCUTANEOUS | Status: DC
  Administered 2015-11-03 – 2015-11-08 (×6): 40 mg via SUBCUTANEOUS
  Filled 2015-11-03 (×5): qty 0.4

## 2015-11-03 MED ORDER — HYDRALAZINE HCL 20 MG/ML IJ SOLN
5.0000 mg | INTRAMUSCULAR | Status: DC | PRN
  Administered 2015-11-03 (×2): 5 mg via INTRAVENOUS
  Filled 2015-11-03 (×2): qty 1

## 2015-11-03 NOTE — Progress Notes (Addendum)
[place] for Infectious Disease   Reason for visit: Follow up on GBS bacteremia  Interval History:  repeat blood cultures ngtd; less encephalopathic   Physical Exam: Constitutional:  Vitals:   11/03/15 0700 11/03/15 0800  BP: 139/76 137/71  Pulse: (!) 56 (!) 58  Resp: 20 (!) 21  Temp:     patient appears in NAD, sleepy but arousable Eyes: anicteric HENT: no thrush; right ear with crusty discharge from canal Respiratory: Normal respiratory effort; CTA B Cardiovascular: RRR GI: soft, nt, nd  Review of Systems: Unable to assess due to mental status  Lab Results  Component Value Date   WBC 12.3 (H) 11/03/2015   HGB 13.9 11/03/2015   HCT 40.2 11/03/2015   MCV 85.4 11/03/2015   PLT 257 11/03/2015    Lab Results  Component Value Date   CREATININE 0.83 11/03/2015   BUN 17 11/03/2015   NA 135 11/03/2015   K 3.4 (L) 11/03/2015   CL 103 11/03/2015   CO2 21 (L) 11/03/2015    Lab Results  Component Value Date   ALT 35 10/29/2015   AST 35 10/29/2015   ALKPHOS 85 10/29/2015     Microbiology: Recent Results (from the past 240 hour(s))  Urine culture     Status: None   Collection Time: 10/29/15  3:55 AM  Result Value Ref Range Status   Specimen Description URINE, RANDOM  Final   Special Requests NONE  Final   Culture NO GROWTH  Final   Report Status 10/30/2015 FINAL  Final  Blood Culture (routine x 2)     Status: Abnormal   Collection Time: 10/29/15  4:00 AM  Result Value Ref Range Status   Specimen Description BLOOD LEFT ARM  Final   Special Requests BOTTLES DRAWN AEROBIC AND ANAEROBIC 5ML  Final   Culture  Setup Time   Final    GRAM POSITIVE COCCI IN CHAINS IN BOTH AEROBIC AND ANAEROBIC BOTTLES CRITICAL RESULT CALLED TO, READ BACK BY AND VERIFIED WITH: R RUMBARGER,PHARMD AT 1541 10/29/15 BY L BENFIELD    Culture GROUP B STREP(S.AGALACTIAE)ISOLATED (A)  Final   Report Status 10/31/2015 FINAL  Final   Organism ID, Bacteria GROUP B  STREP(S.AGALACTIAE)ISOLATED  Final      Susceptibility   Group b strep(s.agalactiae)isolated - MIC*    CLINDAMYCIN <=0.25 SENSITIVE Sensitive     AMPICILLIN <=0.25 SENSITIVE Sensitive     ERYTHROMYCIN <=0.12 SENSITIVE Sensitive     VANCOMYCIN 0.5 SENSITIVE Sensitive     CEFTRIAXONE <=0.12 SENSITIVE Sensitive     LEVOFLOXACIN 1 SENSITIVE Sensitive     * GROUP B STREP(S.AGALACTIAE)ISOLATED  Blood Culture ID Panel (Reflexed)     Status: Abnormal   Collection Time: 10/29/15  4:00 AM  Result Value Ref Range Status   Enterococcus species NOT DETECTED NOT DETECTED Final   Listeria monocytogenes NOT DETECTED NOT DETECTED Final   Staphylococcus species NOT DETECTED NOT DETECTED Final   Staphylococcus aureus NOT DETECTED NOT DETECTED Final   Streptococcus species DETECTED (A) NOT DETECTED Final    Comment: CRITICAL RESULT CALLED TO, READ BACK BY AND VERIFIED WITH: R RUMBARGER,PHARMD AT 1541 10/29/15 BY L BENFIELD    Streptococcus agalactiae DETECTED (A) NOT DETECTED Final    Comment: CRITICAL RESULT CALLED TO, READ BACK BY AND VERIFIED WITH: R RUMBARGER,PHARMD AT 1541 10/29/15 BY L BENFIELD    Streptococcus pneumoniae NOT DETECTED NOT DETECTED Final   Streptococcus pyogenes NOT DETECTED NOT DETECTED Final   Acinetobacter baumannii NOT  DETECTED NOT DETECTED Final   Enterobacteriaceae species NOT DETECTED NOT DETECTED Final   Enterobacter cloacae complex NOT DETECTED NOT DETECTED Final   Escherichia coli NOT DETECTED NOT DETECTED Final   Klebsiella oxytoca NOT DETECTED NOT DETECTED Final   Klebsiella pneumoniae NOT DETECTED NOT DETECTED Final   Proteus species NOT DETECTED NOT DETECTED Final   Serratia marcescens NOT DETECTED NOT DETECTED Final   Haemophilus influenzae NOT DETECTED NOT DETECTED Final   Neisseria meningitidis NOT DETECTED NOT DETECTED Final   Pseudomonas aeruginosa NOT DETECTED NOT DETECTED Final   Candida albicans NOT DETECTED NOT DETECTED Final   Candida glabrata NOT  DETECTED NOT DETECTED Final   Candida krusei NOT DETECTED NOT DETECTED Final   Candida parapsilosis NOT DETECTED NOT DETECTED Final   Candida tropicalis NOT DETECTED NOT DETECTED Final  Blood Culture (routine x 2)     Status: Abnormal   Collection Time: 10/29/15  4:15 AM  Result Value Ref Range Status   Specimen Description BLOOD LEFT ARM  Final   Special Requests IN PEDIATRIC BOTTLE 2ML  Final   Culture  Setup Time   Final    GRAM POSITIVE COCCI IN CHAINS IN PEDIATRIC BOTTLE CRITICAL RESULT CALLED TO, READ BACK BY AND VERIFIED WITH: R RUMBARGER,PHARMD AT 1541 10/29/15 BY L BENFIELD    Culture (A)  Final    GROUP B STREP(S.AGALACTIAE)ISOLATED SUSCEPTIBILITIES PERFORMED ON PREVIOUS CULTURE WITHIN THE LAST 5 DAYS.    Report Status 10/31/2015 FINAL  Final  MRSA PCR Screening     Status: None   Collection Time: 10/29/15  6:39 AM  Result Value Ref Range Status   MRSA by PCR NEGATIVE NEGATIVE Final    Comment:        The GeneXpert MRSA Assay (FDA approved for NASAL specimens only), is one component of a comprehensive MRSA colonization surveillance program. It is not intended to diagnose MRSA infection nor to guide or monitor treatment for MRSA infections.   Respiratory Panel by PCR     Status: None   Collection Time: 10/29/15 11:15 AM  Result Value Ref Range Status   Adenovirus NOT DETECTED NOT DETECTED Final   Coronavirus 229E NOT DETECTED NOT DETECTED Final   Coronavirus HKU1 NOT DETECTED NOT DETECTED Final   Coronavirus NL63 NOT DETECTED NOT DETECTED Final   Coronavirus OC43 NOT DETECTED NOT DETECTED Final   Metapneumovirus NOT DETECTED NOT DETECTED Final   Rhinovirus / Enterovirus NOT DETECTED NOT DETECTED Final   Influenza A NOT DETECTED NOT DETECTED Final   Influenza B NOT DETECTED NOT DETECTED Final   Parainfluenza Virus 1 NOT DETECTED NOT DETECTED Final   Parainfluenza Virus 2 NOT DETECTED NOT DETECTED Final   Parainfluenza Virus 3 NOT DETECTED NOT DETECTED Final    Parainfluenza Virus 4 NOT DETECTED NOT DETECTED Final   Respiratory Syncytial Virus NOT DETECTED NOT DETECTED Final   Bordetella pertussis NOT DETECTED NOT DETECTED Final   Chlamydophila pneumoniae NOT DETECTED NOT DETECTED Final   Mycoplasma pneumoniae NOT DETECTED NOT DETECTED Final  Culture, respiratory (NON-Expectorated)     Status: None   Collection Time: 10/30/15 11:22 AM  Result Value Ref Range Status   Specimen Description TRACHEAL ASPIRATE  Final   Special Requests NONE  Final   Gram Stain   Final    ABUNDANT WBC PRESENT,BOTH PMN AND MONONUCLEAR RARE SQUAMOUS EPITHELIAL CELLS PRESENT RARE GRAM POSITIVE COCCI IN PAIRS RARE GRAM POSITIVE RODS    Culture RARE CANDIDA ALBICANS  Final   Report  Status 11/01/2015 FINAL  Final  Culture, blood (Routine X 2) w Reflex to ID Panel     Status: None (Preliminary result)   Collection Time: 10/31/15  9:28 AM  Result Value Ref Range Status   Specimen Description BLOOD RIGHT ARM  Final   Special Requests IN PEDIATRIC BOTTLE 2CC  Final   Culture NO GROWTH 3 DAYS  Final   Report Status PENDING  Incomplete  Culture, blood (Routine X 2) w Reflex to ID Panel     Status: None (Preliminary result)   Collection Time: 10/31/15  9:32 AM  Result Value Ref Range Status   Specimen Description BLOOD RIGHT HAND  Final   Special Requests IN PEDIATRIC BOTTLE 3CC  Final   Culture NO GROWTH 3 DAYS  Final   Report Status PENDING  Incomplete  CSF culture     Status: None (Preliminary result)   Collection Time: 11/02/15  2:41 PM  Result Value Ref Range Status   Specimen Description CSF  Final   Special Requests NONE  Final   Gram Stain   Final    WBC PRESENT,BOTH PMN AND MONONUCLEAR NO ORGANISMS SEEN CYTOSPIN SMEAR    Culture PENDING  Incomplete   Report Status PENDING  Incomplete    Impression/Plan:  1. GBS bacteremia - fortunately TEE negative for vegetation.  On penicillin and improving.  Will need IV treatment for 2 weeks total from negative  blood culture 10/30 (if it remains negative) through November 12th.   Ok for picc line tomorrow 11/3 if blood cultures remain negative Ok to pull picc line after treatment completion on November 12th Labs per home health protocol or SNF protocol Timing of urologic procedure most likely due to timing but unclear why recurrent x 2.   Will continue to follow blood culture  2. Renal mass - urology following and will need surgery as outpatient  3.  AMS - improving, likely sepsis related and slowly improving.  LP with increased WBCs and increased protein and normal glucose c/w meningitis.  No organisms seen and likely related to underlying infection. Will continue with current antibiotics.  MRI noted and no septic emboli  4. Otitis externa - discharge from ear concerning for infection.  Will start ciprodex drops

## 2015-11-03 NOTE — Progress Notes (Signed)
PULMONARY / CRITICAL CARE MEDICINE   Name: [name] MRN: [id number] DOB: [birth year]    ADMISSION DATE:  10/29/2015  CHIEF COMPLAINT:  AMS  HISTORY OF PRESENT ILLNESS:   60 yo male presented with altered mental status, incontinent of bowel/bladder, fever, and respiratory failure.  He had cystoscopy and transrectal u/s for BPH 2 days prior to admission and given cipro for prophylaxis.  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  VITAL SIGNS: BP 139/76   Pulse (!) 56   Temp 97.9 F (36.6 C) (Oral)   Resp 20   Ht 5\' 8"  (1.727 m)   Wt 200 lb (90.7 kg)   SpO2 98%   BMI 30.41 kg/m   INTAKE / OUTPUT: I/O last 3 completed shifts: In: 3213.7 [I.V.:863.7; IV [id number] Out: 2150 [Urine:2050; Stool:100]  PHYSICAL EXAMINATION: General: alert Neuro: follows commands, moves extremities HEENT: no stridor Cardiac: regular, 2/6 SM Chest: no wheeze Abd: soft, non tender Ext: no edema Skin: no rashes  LABS:  BMET  Recent Labs Lab 11/01/15 0146 11/02/15 0524 11/03/15 0247  NA 142 138 135  K 4.0 3.9 3.4*  CL 108 106 103  CO2 26 23 21*  BUN 23* 18 17  CREATININE 1.02 0.77 0.83  GLUCOSE 123* 137* 96   Electrolytes  Recent Labs Lab 11/01/15 0146 11/02/15 0524 11/03/15 0247  CALCIUM 8.7* 8.4* 8.5*  MG 2.1 1.9 2.0  PHOS 3.3 2.5 2.6   CBC  Recent Labs Lab 11/01/15 0146 11/02/15 0524 11/03/15 0247  WBC 13.4* 11.9* 12.3*  HGB 12.6* 12.6* 13.9  HCT 39.2 36.8* 40.2  PLT 184 223 257   Coag's  Recent Labs Lab 10/29/15 0355  APTT 25  INR 1.08   Sepsis Markers  Recent Labs Lab 10/29/15 0417 10/29/15 1127 10/30/15 0446  LATICACIDVEN 3.20* 3.0* 1.7   ABG  Recent Labs Lab 10/31/15 0435 11/01/15 0425 11/02/15 0439  PHART 7.437 7.436 7.503*  PCO2ART 38.1 38.7 31.8*  PO2ART 131* 128* 59.1*   Liver Enzymes  Recent Labs Lab 10/29/15 0355  AST 35  ALT 35  ALKPHOS 85   BILITOT 1.1  ALBUMIN 4.6   Cardiac Enzymes  Recent Labs Lab 10/29/15 1117 10/29/15 1644 10/29/15 2331  TROPONINI 0.10* 0.10* 0.08*   Glucose  Recent Labs Lab 11/02/15 0500 11/02/15 0816 11/02/15 1109 11/02/15 1606 11/02/15 2003 11/03/15 0008  GLUCAP 103* 87 92 114* 92 105*   Imaging Mr Brain Wo Contrast  Result Date: 11/02/2015 CLINICAL DATA:  Encephalopathy. EXAM: MRI HEAD WITHOUT CONTRAST TECHNIQUE: Multiplanar, multiecho pulse sequences of the brain and surrounding structures were obtained without intravenous contrast. COMPARISON:  Head CT 10/29/2015, brain MRI 02/26/2008 FINDINGS: The examination is markedly degraded by motion. Brain: There are multiple areas within the in bilateral frontal lobes thin show hyperintensity on diffusion-weighted imaging without clear correlate on the ADC map. Additionally, there are areas of diffusion restriction within the dependent portion of the occipital horns of both lateral ventricles. There is hemosiderin deposition related to remote hemorrhagic infarcts along the right external capsule and left frontal lobe. There is hyperintense T2 weighted signal within the left temporal lobe at the site of prior infarct. No mass lesion or midline shift. No hydrocephalus. Vascular: Major intracranial arterial and venous sinus flow voids are preserved. No evidence of chronic microhemorrhage or amyloid angiopathy. Skull and upper cervical spine: The visualized skull base, calvarium, upper cervical spine and extracranial soft  tissues are normal. Sinuses/Orbits: Bilateral mastoid effusions and opacification of the right sphenoid sinus. Normal orbits. IMPRESSION: 1. Despite efforts by the technologist and patient, motion artifact is present on today's examination and could not be eliminated. This reduces the sensitivity and specificity of the study. 2. Areas of layering diffusion restriction within the dependent portions of the lateral ventricles may be secondary  to chronic blood products related to prior hemorrhage, though in the context of infection, the appearance is concerning for ventriculitis. Recommend correlation with CSF analysis. Further MR imaging with intravenous contrast material might also be helpful. 3. Multiple areas of abnormal DWI intensity within both frontal lobes, which mostly appear to be within sulci. Given the lack of low ADC values at these sites, the findings may represent sequelae of remote hemorrhage or leptomeningeal abnormality (such as in the context of meningitis). 4. Remote infarcts of the right external capsule and left temporal lobe with associated hemosiderin deposition. Electronically Signed   By: Ulyses Jarred M.D.   On: 11/02/2015 15:57   Dg Chest Port 1 View  Result Date: 11/03/2015 CLINICAL DATA:  60 year old male with acute respiratory failure. Subsequent encounter. EXAM: PORTABLE CHEST 1 VIEW COMPARISON:  11/02/2015 FINDINGS: Resolution of pulmonary vascular congestion. Elevated right hemidiaphragm with minimal right base subsegmental atelectasis. Cardiomegaly. Minimally tortuous aorta. IMPRESSION: Resolution of pulmonary vascular congestion. Cardiomegaly. Electronically Signed   By: Genia Del M.D.   On: 11/03/2015 07:09   Dg Fluoro Guide Lumbar Puncture  Result Date: 11/02/2015 CLINICAL DATA:  Confusion, fever, altered mental status. EXAM: DIAGNOSTIC LUMBAR PUNCTURE UNDER FLUOROSCOPIC GUIDANCE FLUOROSCOPY TIME:  Fluoroscopy Time:  18 seconds Radiation Exposure Index (if provided by the fluoroscopic device): Number of Acquired Spot Images: 0 PROCEDURE: Informed consent was obtained from the patient prior to the procedure, including potential complications of headache, allergy, and pain. With the patient prone, the lower back was prepped with Betadine. 1% Lidocaine was used for local anesthesia. Lumbar puncture was performed at the L3-4 level using a 20 gauge needle with return of slightly yellowish CSF with an opening  pressure of 39 cm water. 15 ml of CSF were obtained for laboratory studies. The patient tolerated the procedure well and there were no apparent complications. IMPRESSION: Technically successful lumbar puncture under fluoroscopic guidance. Fluid was slightly yellowish. Opening pressure significantly elevated at 39 cm H2O. Electronically Signed   By: Rolm Baptise M.D.   On: 11/02/2015 14:55   STUDIES:  CT head/neck 10/28 >> old Lt temporal infarct with encephalomalacia TEE 10/30 >> EF 60 to 65%, aortic root 41 mm, mod MR, no vegetations EEG 10/30 >> moderate slowing CT abd/pelvis 10/31 >> Rt renal mass 53 mm, 14 mm liver lucency MRI brain 11/01 >> motion artifact, ? Ventriculitis, remote infarcts Rt external capsule and Lt temporal lobe LP 11/01 >> glucose 41, protein 130, RBC 5, WBC 158 (41N, 30L, 22M, 1E)  CULTURES: Blood 10/28 >> Group B Strep Respiratory viral panel 10/28 >> negative Urine 10/28 >> negative Sputum 10/29 >> negative Blood 10/30 >> CSF 11/01 >>   ANTIBIOTICS: Vancomycin 10/28 >> 10/30 Zosyn 10/28 >> 10/30 Pencillin 10/30 >>  SIGNIFICANT EVENTS: 10/28 intubated in the ED for agitation 10/29 cardiology consulted for TEE, ID consulted for bacteremia 11/01 urology consulted, LP by IR  LINES/TUBES: ETT 10/28 >> 10/31  DISCUSSION: 60 y/o p/w sepsis, group B strep bacteremia. Incidental findings of renal mass suspicious for malignancy.  60 yo with altered mental status from Group B Strep bacteremia after cystoscopy  and transrectal ultrasound.  Found to have meningitis and right renal mass.  ASSESSMENT / PLAN:  Acute encephalopathy >> improving. LP results consistent with meningitis. - f/u LP cultures results from 11/01 - wean off precedex for RASS goal 0 - hold outpt bupropion for now  Group B Strep bacteremia. Otitis. Meningitis. - Abx per ID - place PICC line if blood cx from 10/30 negative for 72 hrs - will need Abx through 11/13/15 for  bacteremia  Right renal mass. Hx of BPH. - f/u with urology  Hx of HLD. Hx of MVP. - hold outpt crestor for now  Nutrition. Dysphagia. - pureed diet - f/u with speech therapy  Deconditioning. - PT, OT  Resolved problems >> compromise airway, lactic acidosis  DVT prophylaxis - Lovenox, SCDs SUP - No indicated Goal of care - full code  D/w Dr. Linus Salmons Updated pt's wife at bedside  CC time 31 minutes  Chesley Mires, MD [place] 11/03/2015, 9:19 AM Pager:  [phone] After 3pm call: [phone]

## 2015-11-03 NOTE — Consult Note (Signed)
Assessment:  Consultation re: solid Right renal mass. ( see below)  Plan:  Support and follow through menningitis episode. Pt to see Dr. Phebe Colla in January.     Subjective: Family consultation, wife. Wednesday evening, 60 minutes. Reviewed CT scan, and discussed new finding of Right solid renal mass. Points discussed: A.. Mass not there on prior CT approx. 5 yrs ago B. Mass solid. Tissue density 59 HU. vs adjacent cyst. 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 C. Stage of tumor: probable c T 1 [id number] D. Probable 75%-90% 5 yr survival rate with laparascopic partial robotic nephrectomy.  E. Films reviewed also with Dr. [name] and Radiology. Will give pt time to recover from his current problem, then see Dr. [name] in f/u for surgical planning. Will need antibiotic coverage per ID, Cardiology.   Objective: Vital signs in last 24 hours: Temp:  [97.8 F (36.6 C)-101.8 F (38.8 C)] 97.9 F (36.6 C) (11/01 2300) Pulse Rate:  [47-77] 56 (11/02 0700) Resp:  [9-27] 20 (11/02 0700) BP: (92-160)/(49-99) 139/76 (11/02 0700) SpO2:  [83 %-99 %] 98 % (11/02 0700) Weight:  [90.7 kg (200 lb)] 90.7 kg (200 lb) (11/02 0557)A  Intake/Output from previous day: 11/01 0701 - 11/02 0700 In: 2124.2 [I.V.:524.2; IV Piggyback:1600] Out: 1200 [Urine:1100; Stool:100] Intake/Output this shift: No intake/output data recorded.  Past Medical History:  Diagnosis Date  . CVA (cerebral infarction)    hemmorhagic  CVA  . Dyslipidemia   . Endocarditis    [facility] [place]  . Mitral regurgitation   . Myocardial infarction   . Stroke [facility])     Physical Exam:  Lungs - Normal respiratory effort, chest expands symmetrically.  Abdomen - Soft, non-tender & non-distended.  Lab Results:  Recent Labs  11/01/15 0146 11/02/15 0524 11/03/15 0247  WBC 13.4* 11.9* 12.3*  HGB 12.6* 12.6* 13.9  HCT 39.2 36.8* 40.2   BMET  Recent Labs  11/02/15 0524 11/03/15 0247  NA 138 135  K 3.9 3.4*  CL 106 103  CO2 23 21*  GLUCOSE  137* 96  BUN 18 17  CREATININE 0.77 0.83  CALCIUM 8.4* 8.5*   No results for input(s): LABURIN in the last 72 hours. Results for orders placed or performed during the hospital encounter of 10/29/15  Urine culture     Status: None   Collection Time: 10/29/15  3:55 AM  Result Value Ref Range Status   Specimen Description URINE, RANDOM  Final   Special Requests NONE  Final   Culture NO GROWTH  Final   Report Status 10/30/2015 FINAL  Final  Blood Culture (routine x 2)     Status: Abnormal   Collection Time: 10/29/15  4:00 AM  Result Value Ref Range Status   Specimen Description BLOOD LEFT ARM  Final   Special Requests BOTTLES DRAWN AEROBIC AND ANAEROBIC 5ML  Final   Culture  Setup Time   Final    GRAM POSITIVE COCCI IN CHAINS IN BOTH AEROBIC AND ANAEROBIC BOTTLES CRITICAL RESULT CALLED TO, READ BACK BY AND VERIFIED WITH: R RUMBARGER,PHARMD AT [id number] 10/29/15 BY L BENFIELD    Culture GROUP B STREP(S.AGALACTIAE)ISOLATED (A)  Final   Report Status 10/31/2015 FINAL  Final   Organism ID, Bacteria GROUP B STREP(S.AGALACTIAE)ISOLATED  Final      Susceptibility   Group b strep(s.agalactiae)isolated - MIC*    CLINDAMYCIN <=0.25 SENSITIVE Sensitive     AMPICILLIN <=0.25 SENSITIVE Sensitive     ERYTHROMYCIN <=0.12 SENSITIVE Sensitive     VANCOMYCIN 0.5 SENSITIVE Sensitive  CEFTRIAXONE <=0.12 SENSITIVE Sensitive     LEVOFLOXACIN 1 SENSITIVE Sensitive     * GROUP B STREP(S.AGALACTIAE)ISOLATED  Blood Culture ID Panel (Reflexed)     Status: Abnormal   Collection Time: 10/29/15  4:00 AM  Result Value Ref Range Status   Enterococcus species NOT DETECTED NOT DETECTED Final   Listeria monocytogenes NOT DETECTED NOT DETECTED Final   Staphylococcus species NOT DETECTED NOT DETECTED Final   Staphylococcus aureus NOT DETECTED NOT DETECTED Final   Streptococcus species DETECTED (A) NOT DETECTED Final    Comment: CRITICAL RESULT CALLED TO, READ BACK BY AND VERIFIED WITH: R RUMBARGER,PHARMD AT 1541  10/29/15 BY L BENFIELD    Streptococcus agalactiae DETECTED (A) NOT DETECTED Final    Comment: CRITICAL RESULT CALLED TO, READ BACK BY AND VERIFIED WITH: R RUMBARGER,PHARMD AT 1541 10/29/15 BY L BENFIELD    Streptococcus pneumoniae NOT DETECTED NOT DETECTED Final   Streptococcus pyogenes NOT DETECTED NOT DETECTED Final   Acinetobacter baumannii NOT DETECTED NOT DETECTED Final   Enterobacteriaceae species NOT DETECTED NOT DETECTED Final   Enterobacter cloacae complex NOT DETECTED NOT DETECTED Final   Escherichia coli NOT DETECTED NOT DETECTED Final   Klebsiella oxytoca NOT DETECTED NOT DETECTED Final   Klebsiella pneumoniae NOT DETECTED NOT DETECTED Final   Proteus species NOT DETECTED NOT DETECTED Final   Serratia marcescens NOT DETECTED NOT DETECTED Final   Haemophilus influenzae NOT DETECTED NOT DETECTED Final   Neisseria meningitidis NOT DETECTED NOT DETECTED Final   Pseudomonas aeruginosa NOT DETECTED NOT DETECTED Final   Candida albicans NOT DETECTED NOT DETECTED Final   Candida glabrata NOT DETECTED NOT DETECTED Final   Candida krusei NOT DETECTED NOT DETECTED Final   Candida parapsilosis NOT DETECTED NOT DETECTED Final   Candida tropicalis NOT DETECTED NOT DETECTED Final  Blood Culture (routine x 2)     Status: Abnormal   Collection Time: 10/29/15  4:15 AM  Result Value Ref Range Status   Specimen Description BLOOD LEFT ARM  Final   Special Requests IN PEDIATRIC BOTTLE 2ML  Final   Culture  Setup Time   Final    GRAM POSITIVE COCCI IN CHAINS IN PEDIATRIC BOTTLE CRITICAL RESULT CALLED TO, READ BACK BY AND VERIFIED WITH: R RUMBARGER,PHARMD AT 1541 10/29/15 BY L BENFIELD    Culture (A)  Final    GROUP B STREP(S.AGALACTIAE)ISOLATED SUSCEPTIBILITIES PERFORMED ON PREVIOUS CULTURE WITHIN THE LAST 5 DAYS.    Report Status 10/31/2015 FINAL  Final  MRSA PCR Screening     Status: None   Collection Time: 10/29/15  6:39 AM  Result Value Ref Range Status   MRSA by PCR NEGATIVE  NEGATIVE Final    Comment:        The GeneXpert MRSA Assay (FDA approved for NASAL specimens only), is one component of a comprehensive MRSA colonization surveillance program. It is not intended to diagnose MRSA infection nor to guide or monitor treatment for MRSA infections.   Respiratory Panel by PCR     Status: None   Collection Time: 10/29/15 11:15 AM  Result Value Ref Range Status   Adenovirus NOT DETECTED NOT DETECTED Final   Coronavirus 229E NOT DETECTED NOT DETECTED Final   Coronavirus HKU1 NOT DETECTED NOT DETECTED Final   Coronavirus NL63 NOT DETECTED NOT DETECTED Final   Coronavirus OC43 NOT DETECTED NOT DETECTED Final   Metapneumovirus NOT DETECTED NOT DETECTED Final   Rhinovirus / Enterovirus NOT DETECTED NOT DETECTED Final   Influenza A NOT DETECTED NOT  DETECTED Final   Influenza B NOT DETECTED NOT DETECTED Final   Parainfluenza Virus 1 NOT DETECTED NOT DETECTED Final   Parainfluenza Virus 2 NOT DETECTED NOT DETECTED Final   Parainfluenza Virus 3 NOT DETECTED NOT DETECTED Final   Parainfluenza Virus 4 NOT DETECTED NOT DETECTED Final   Respiratory Syncytial Virus NOT DETECTED NOT DETECTED Final   Bordetella pertussis NOT DETECTED NOT DETECTED Final   Chlamydophila pneumoniae NOT DETECTED NOT DETECTED Final   Mycoplasma pneumoniae NOT DETECTED NOT DETECTED Final  Culture, respiratory (NON-Expectorated)     Status: None   Collection Time: 10/30/15 11:22 AM  Result Value Ref Range Status   Specimen Description TRACHEAL ASPIRATE  Final   Special Requests NONE  Final   Gram Stain   Final    ABUNDANT WBC PRESENT,BOTH PMN AND MONONUCLEAR RARE SQUAMOUS EPITHELIAL CELLS PRESENT RARE GRAM POSITIVE COCCI IN PAIRS RARE GRAM POSITIVE RODS    Culture RARE CANDIDA ALBICANS  Final   Report Status 11/01/2015 FINAL  Final  Culture, blood (Routine X 2) w Reflex to ID Panel     Status: None (Preliminary result)   Collection Time: 10/31/15  9:28 AM  Result Value Ref Range  Status   Specimen Description BLOOD RIGHT ARM  Final   Special Requests IN PEDIATRIC BOTTLE 2CC  Final   Culture NO GROWTH 3 DAYS  Final   Report Status PENDING  Incomplete  Culture, blood (Routine X 2) w Reflex to ID Panel     Status: None (Preliminary result)   Collection Time: 10/31/15  9:32 AM  Result Value Ref Range Status   Specimen Description BLOOD RIGHT HAND  Final   Special Requests IN PEDIATRIC BOTTLE 3CC  Final   Culture NO GROWTH 3 DAYS  Final   Report Status PENDING  Incomplete  CSF culture     Status: None (Preliminary result)   Collection Time: 11/02/15  2:41 PM  Result Value Ref Range Status   Specimen Description CSF  Final   Special Requests NONE  Final   Gram Stain   Final    WBC PRESENT,BOTH PMN AND MONONUCLEAR NO ORGANISMS SEEN CYTOSPIN SMEAR    Culture PENDING  Incomplete   Report Status PENDING  Incomplete    Studies/Results: Mr Brain Wo Contrast  Result Date: 11/02/2015 CLINICAL DATA:  Encephalopathy. EXAM: MRI HEAD WITHOUT CONTRAST TECHNIQUE: Multiplanar, multiecho pulse sequences of the brain and surrounding structures were obtained without intravenous contrast. COMPARISON:  Head CT 10/29/2015, brain MRI 02/26/2008 FINDINGS: The examination is markedly degraded by motion. Brain: There are multiple areas within the in bilateral frontal lobes thin show hyperintensity on diffusion-weighted imaging without clear correlate on the ADC map. Additionally, there are areas of diffusion restriction within the dependent portion of the occipital horns of both lateral ventricles. There is hemosiderin deposition related to remote hemorrhagic infarcts along the right external capsule and left frontal lobe. There is hyperintense T2 weighted signal within the left temporal lobe at the site of prior infarct. No mass lesion or midline shift. No hydrocephalus. Vascular: Major intracranial arterial and venous sinus flow voids are preserved. No evidence of chronic microhemorrhage or  amyloid angiopathy. Skull and upper cervical spine: The visualized skull base, calvarium, upper cervical spine and extracranial soft tissues are normal. Sinuses/Orbits: Bilateral mastoid effusions and opacification of the right sphenoid sinus. Normal orbits. IMPRESSION: 1. Despite efforts by the technologist and patient, motion artifact is present on today's examination and could not be eliminated. This reduces the sensitivity  and specificity of the study. 2. Areas of layering diffusion restriction within the dependent portions of the lateral ventricles may be secondary to chronic blood products related to prior hemorrhage, though in the context of infection, the appearance is concerning for ventriculitis. Recommend correlation with CSF analysis. Further MR imaging with intravenous contrast material might also be helpful. 3. Multiple areas of abnormal DWI intensity within both frontal lobes, which mostly appear to be within sulci. Given the lack of low ADC values at these sites, the findings may represent sequelae of remote hemorrhage or leptomeningeal abnormality (such as in the context of meningitis). 4. Remote infarcts of the right external capsule and left temporal lobe with associated hemosiderin deposition. Electronically Signed   By: Ulyses Jarred M.D.   On: 11/02/2015 15:57   Dg Chest Port 1 View  Result Date: 11/03/2015 CLINICAL DATA:  60 year old male with acute respiratory failure. Subsequent encounter. EXAM: PORTABLE CHEST 1 VIEW COMPARISON:  11/02/2015 FINDINGS: Resolution of pulmonary vascular congestion. Elevated right hemidiaphragm with minimal right base subsegmental atelectasis. Cardiomegaly. Minimally tortuous aorta. IMPRESSION: Resolution of pulmonary vascular congestion. Cardiomegaly. Electronically Signed   By: Genia Del M.D.   On: 11/03/2015 07:09   Dg Fluoro Guide Lumbar Puncture  Result Date: 11/02/2015 CLINICAL DATA:  Confusion, fever, altered mental status. EXAM: DIAGNOSTIC  LUMBAR PUNCTURE UNDER FLUOROSCOPIC GUIDANCE FLUOROSCOPY TIME:  Fluoroscopy Time:  18 seconds Radiation Exposure Index (if provided by the fluoroscopic device): Number of Acquired Spot Images: 0 PROCEDURE: Informed consent was obtained from the patient prior to the procedure, including potential complications of headache, allergy, and pain. With the patient prone, the lower back was prepped with Betadine. 1% Lidocaine was used for local anesthesia. Lumbar puncture was performed at the L3-4 level using a 20 gauge needle with return of slightly yellowish CSF with an opening pressure of 39 cm water. 15 ml of CSF were obtained for laboratory studies. The patient tolerated the procedure well and there were no apparent complications. IMPRESSION: Technically successful lumbar puncture under fluoroscopic guidance. Fluid was slightly yellowish. Opening pressure significantly elevated at 39 cm H2O. Electronically Signed   By: Rolm Baptise M.D.   On: 11/02/2015 14:55      Nedra Mcinnis I Deeana At[place] 11/03/2015, 7:58 AM

## 2015-11-03 NOTE — Evaluation (Signed)
Clinical/Bedside Swallow Evaluation Patient Details  Name: [name] MRN: [id number] Date of Birth: [birth year]  Today's Date: 11/03/2015 Time: SLP Start Time (ACUTE ONLY): 0850 SLP Stop Time (ACUTE ONLY): 0907 SLP Time Calculation (min) (ACUTE ONLY): 17 min  Past Medical History:  Past Medical History:  Diagnosis Date  . CVA (cerebral infarction)    hemmorhagic  CVA  . Dyslipidemia   . Endocarditis    MV Vegitation  . Mitral regurgitation   . Myocardial infarction   . Stroke [facility])    Past Surgical History:  Past Surgical History:  Procedure Laterality Date  . 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 In the ED he was found to be febrile and combative and required intubation 10/28. Two days prior to his presentation he had a urologic procedure done (details unclear), but it was a trans-urethral and trans-rectal approach to address his prostatic hypertrophy. Extubated 10/31. Dx with sepsis, group B strep bacteremia. Incidental findings of renal mass suspicious for malignancy.   Assessment / Plan / Recommendation Clinical Impression  Patient presenst with a moderate oral dysphagia characterized by oral holding of soft solid and regular texture boluses with delayed oral transit and oral residuals, related to AMS (poor sustained attention) and lethargy. Otherwise however, patient with swift oral transit of pureed solids and thin liquids with what appears to be full airway protection. Recommend initiation of dysphagia 1 diet, thin liquids with close supervision as mentation does increase aspiration risk. Prognosis for ability to advance good with improved alertness and attention.      Aspiration Risk  Moderate aspiration risk    Diet Recommendation Dysphagia 1 (Puree);Thin liquid   Liquid Administration via: Cup;Straw Medication  Administration: Crushed with puree Supervision: Staff to assist with self feeding;Full supervision/cueing for compensatory strategies Compensations: Slow rate;Small sips/bites Postural Changes: Seated upright at 90 degrees    Other  Recommendations Oral Care Recommendations: Oral care BID   Follow up Recommendations None      Frequency and Duration min 2x/week  1 week       Prognosis Prognosis for Safe Diet Advancement: Good Barriers to Reach Goals: Cognitive deficits      Swallow Study   General HPI: [name] is a 60 y/o man with a hx of mitral valve prolapse w/ endocarditis and stroke in 2012 who presented to the ED after being found by his wife altered. In the ED he was found to be febrile and combative and required intubation 10/28. Two days prior to his presentation he had a urologic procedure done (details unclear), but it was a trans-urethral and trans-rectal approach to address his prostatic hypertrophy. Extubated 10/31. Dx with sepsis, group B strep bacteremia. Incidental findings of renal mass suspicious for malignancy. Type of Study: Bedside Swallow Evaluation Previous Swallow Assessment: 2010 s/p CVA, no results Diet Prior to this Study: NPO Temperature Spikes Noted: No Respiratory Status: Nasal cannula History of Recent Intubation: Yes Length of Intubations (days): 4 days Date extubated: 11/01/15 Behavior/Cognition: Lethargic/Drowsy;Distractible;Requires cueing Oral Cavity Assessment: Dry Oral Care Completed by SLP: Yes Oral Cavity - Dentition: Adequate natural dentition Vision: Functional for self-feeding Self-Feeding Abilities: Needs assist Patient Positioning: Upright in bed Baseline Vocal Quality: Other (comment) (hypernasal) Volitional Cough: Strong Volitional Swallow: Able to elicit    Oral/Motor/Sensory Function Overall Oral Motor/Sensory Function:  Within functional limits   Ice Chips Ice chips: Within functional limits   Thin Liquid Thin Liquid:  Within functional limits    Nectar Thick Nectar Thick Liquid: Not tested   Honey Thick Honey Thick Liquid: Not tested   Puree Puree: Within functional limits Presentation: Self Fed   Solid   GO   Solid: Impaired Presentation: Spoon Oral Phase Impairments: Impaired mastication Oral Phase Functional Implications: Prolonged oral transit;Oral residue;Oral holding       [place], CCC-SLP [phone]  [name] [name] 11/03/2015,9:58 AM

## 2015-11-03 NOTE — Progress Notes (Signed)
eLink Physician-Brief Progress Note Patient Name: Aldine Gornik DOB: 09-Dec-1955 MRN: [id number]   Date of Service  11/03/2015  HPI/Events of Note  Hypertension - BP = 177/90.  eICU Interventions  Will change Hydralazine 5 mg IV to Q 4 hours PRN SBP > 170 or DBP > 100.     Intervention Category Intermediate Interventions: Hypertension - evaluation and management  Majour Frei [place] 11/03/2015, 6:10 PM

## 2015-11-03 NOTE — Progress Notes (Signed)
ANTICOAGULATION CONSULT NOTE - Initial Consult  Pharmacy Consult for enoxaparin  Indication: VTE prophylaxis  No Known Allergies  Patient Measurements: Height: 5\' 8"  (172.7 cm) Weight: 200 lb (90.7 kg) IBW/kg (Calculated) : 68.4   Vital Signs: Temp: 98.7 F (37.1 C) (11/02 0900) Temp Source: Axillary (11/02 0900) BP: 137/71 (11/02 0800) Pulse Rate: 58 (11/02 0800)  Labs:  Recent Labs  11/01/15 0146 11/02/15 0524 11/03/15 0247  HGB 12.6* 12.6* 13.9  HCT 39.2 36.8* 40.2  PLT 184 223 257  CREATININE 1.02 0.77 0.83    Estimated Creatinine Clearance: 104.8 mL/min (by C-G formula based on SCr of 0.83 mg/dL).   Medical History: Past Medical History:  Diagnosis Date  . CVA (cerebral infarction)    hemmorhagic  CVA  . Dyslipidemia   . Endocarditis    MV Vegitation  . Mitral regurgitation   . Myocardial infarction   . Stroke [facility])     Medications:  Scheduled:  . ciprofloxacin-dexamethasone  4 drop Right Ear BID  . mouth rinse  15 mL Mouth Rinse BID  . pencillin G potassium IV  4 Million Units Intravenous Q4H  . rosuvastatin  10 mg Oral Daily    Assessment: [name] is a 60 yo male who presented on 10/28 with altered mental status. He is currently being treated for Group B Strep bacteremia with penicillin G. Anticoagulation was held due to a lumbar puncture yesterday (11/1). Pharmacy has been consulted to start Lovenox today for  VTE prophylaxis. Patient has a normal renal function with a CrCL (~100 ml/min) and CBC is stable.   Goal of Therapy:   Monitor platelets by anticoagulation protocol: Yes  Plan:  Lovenox 40 mg SQ once a day Monitor CBC Monitor for s/sx of bleeding Monitor LP site    Ihor Austin, PharmD PGY1 Pharmacy Resident  [phone] 11/03/2015,9:33 AM

## 2015-11-03 NOTE — Progress Notes (Signed)
Nutrition Follow-up  DOCUMENTATION CODES:   Obesity unspecified  INTERVENTION:   Supplement diet once advanced  If TF needed recommend: Jevity 1.2 @ 60 ml/hr (1440 ml/day) 30 ml Prostat BID 250 ml H2O every 6 hours Provides: 1928 kcal, 109 grams protein and 1166 ml H2O Total free water: 2166 ml  NUTRITION DIAGNOSIS:   Inadequate oral intake related to inability to eat as evidenced by NPO status. Ongoing.   GOAL:   Provide needs based on ASPEN/SCCM guidelines Not met.   MONITOR:   Vent status, Labs, Weight trends, I & O's, TF tolerance  REASON FOR ASSESSMENT:   Ventilator    ASSESSMENT:   60 y/o man with a hx of mitral valve prolapse w/ endocarditis and stroke in 2012 who presented to the ED after being found by his wife altered. In the ED he was found to be febrile and combative and required intubation. Two days prior to his presentation he had a urologic procedure done (details unclear), but it was a trans-urethral and trans-rectal approach to address his prostatic hypertrophy.  New renal mass found, plan for follow up after this admission (pt unaware per family wishes) SLP eval pending.   Medications reviewed  Labs reviewed: K+3.4   Diet Order:  DIET - DYS 1 Room service appropriate? Yes; Fluid consistency: Thin  Skin:  Reviewed, no issues  Last BM:  100 ml out rectal tube over last 24 hours  Height:   Ht Readings from Last 1 Encounters:  10/29/15 '5\' 8"'  (1.727 m)    Weight:   Wt Readings from Last 1 Encounters:  11/03/15 200 lb (90.7 kg)    Ideal Body Weight:  70 kg  BMI:  Body mass index is 30.41 kg/m.  Estimated Nutritional Needs:   Kcal:  1900-2100  Protein:  105-115 grams  Fluid:  2 L/day  EDUCATION NEEDS:   No education needs identified at this time  [place], [place], [place] Pager [phone] After Hours Pager

## 2015-11-03 NOTE — Consult Note (Signed)
Short Note:  Patient continues to clinically improve. More engaged and alert today.  MRI and CSF studies reviewed with wife, ID and primary team  Agree with ID recommendations  Please call back with any questions

## 2015-11-03 NOTE — Progress Notes (Signed)
eLink Physician-Brief Progress Note Patient Name: [name] DOB: 1955/07/20 MRN: [id number]   Date of Service  11/03/2015  HPI/Events of Note  Called regarding SBP .> 170's. Hydralazine 5mg  is ordered, has been given  eICU Interventions  Additional hydralazine 10mg  now Increase prn hydralazine to 10-20mg  IV q6h prn SBP > 170, DBP > 90     Intervention Category Intermediate Interventions: Hypertension - evaluation and management  Dynastie Knoop S. 11/03/2015, 11:27 PM

## 2015-11-04 DIAGNOSIS — G009 Bacterial meningitis, unspecified: Secondary | ICD-10-CM

## 2015-11-04 DIAGNOSIS — H60391 Other infective otitis externa, right ear: Secondary | ICD-10-CM

## 2015-11-04 DIAGNOSIS — A491 Streptococcal infection, unspecified site: Secondary | ICD-10-CM

## 2015-11-04 LAB — BASIC METABOLIC PANEL
Anion gap: 10 (ref 5–15)
BUN: 13 mg/dL (ref 6–20)
CALCIUM: 8.9 mg/dL (ref 8.9–10.3)
CHLORIDE: 102 mmol/L (ref 101–111)
CO2: 23 mmol/L (ref 22–32)
CREATININE: 0.9 mg/dL (ref 0.61–1.24)
Glucose, Bld: 122 mg/dL — ABNORMAL HIGH (ref 65–99)
Potassium: 3.3 mmol/L — ABNORMAL LOW (ref 3.5–5.1)
SODIUM: 135 mmol/L (ref 135–145)

## 2015-11-04 MED ORDER — METOPROLOL TARTRATE 25 MG PO TABS
25.0000 mg | ORAL_TABLET | Freq: Two times a day (BID) | ORAL | Status: DC
  Administered 2015-11-04 – 2015-11-08 (×9): 25 mg via ORAL
  Filled 2015-11-04 (×9): qty 1

## 2015-11-04 MED ORDER — POTASSIUM CHLORIDE 20 MEQ/15ML (10%) PO SOLN
40.0000 meq | Freq: Once | ORAL | Status: AC
  Administered 2015-11-04: 40 meq via ORAL
  Filled 2015-11-04: qty 30

## 2015-11-04 NOTE — Evaluation (Signed)
Physical Therapy Evaluation Patient Details Name: [name] MRN: [id number] DOB: [birth year] Today's Date: 11/04/2015   History of Present Illness  60 yo male presented with altered mental status, incontinent of bowel/bladder, fever, and respiratory failure. He had cystoscopy and transrectal u/s for BPH 2 days prior to admission and given cipro for prophylaxis. 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 Noted to have Rt ear infection. Found to have Group B Strep bacteremia and meningitis  Clinical Impression  Patient seen for mobility assessment in conjunction with OT therapist. At this time, patient demonstrates deficits in functional mobility as indicated below. Will need continued skilled PT to address deficits and amximize function. Will see as indicated and progress as tolerated. OF NOTE: patient was extremely independent prior to admission (just finished a bike tour of [place]) Feel patient would be ideal candidate for short term intense comprehensive rehabilitation to regain independence and function. Will follow.    Follow Up Recommendations CIR;Supervision/Assistance - 24 hour    Equipment Recommendations  Other (comment) (TBD)    Recommendations for Other Services       Precautions / Restrictions Precautions Precautions: Fall Restrictions Weight Bearing Restrictions: No      Mobility  Bed Mobility Overal bed mobility: Needs Assistance Bed Mobility: Supine to Sit     Supine to sit: Min guard     General bed mobility comments: Min guard for safety, some impulsivitity noted  Transfers Overall transfer level: Needs assistance Equipment used: Rolling walker (2 wheeled) Transfers: Sit to/from Stand Sit to Stand: Min assist         General transfer comment: min assist for stability with elevation to upright positioning  Ambulation/Gait Ambulation/Gait assistance: Mod assist Ambulation Distance (Feet): 38 Feet Assistive device:  Rolling walker (2 wheeled) Gait Pattern/deviations: Step-through pattern;Decreased stride length;Ataxic;Staggering right;Narrow base of support Gait velocity: decreased Gait velocity interpretation: Below normal speed for age/gender General Gait Details: patient very unsteady with ambulation, difficulty sequencing step length and gait. Hands on physical assist required for stability and increased assist for manual maneuvering of RW  Stairs            Wheelchair Mobility    Modified Rankin (Stroke Patients Only)       Balance Overall balance assessment: Needs assistance Sitting-balance support: Feet supported Sitting balance-Leahy Scale: Fair     Standing balance support: Bilateral upper extremity supported Standing balance-Leahy Scale: Poor Standing balance comment: heavy reliance on RW for UE support, posterior lean against bed for stabilization. Decreased righting reactions             High level balance activites: Direction changes;Turns High Level Balance Comments: patient with difficulty making turns using RW, loss of balance noted x3, moderate assist to stabilize             Pertinent Vitals/Pain Pain Assessment: No/denies pain    Home Living Family/patient expects to be discharged to:: Private residence Living Arrangements: Spouse/significant other Available Help at Discharge: Family Type of Home: House Home Access: Stairs to enter Entrance Stairs-Rails: None Technical brewer of Steps: 3 Home Layout: Two level        Prior Function Level of Independence: Independent         Comments: just finished a bike tour of [place]     Hand Dominance   Dominant Hand: Right    Extremity/Trunk Assessment   Upper Extremity Assessment: Overall WFL for tasks assessed  Lower Extremity Assessment: Generalized weakness (decreased overall coordination noted bilateral LEs)         Communication   Communication: HOH  Cognition  Arousal/Alertness: Awake/alert Behavior During Therapy: Impulsive Overall Cognitive Status: Impaired/Different from baseline Area of Impairment: Orientation;Attention;Following commands;Safety/judgement;Awareness;Problem solving Orientation Level: Disoriented to;Situation Current Attention Level: Selective   Following Commands: Follows one step commands consistently Safety/Judgement: Decreased awareness of safety;Decreased awareness of deficits Awareness: Emergent Problem Solving: Slow processing;Difficulty sequencing;Requires verbal cues;Requires tactile cues      General Comments      Exercises     Assessment/Plan    PT Assessment Patient needs continued PT services  PT Problem List Decreased strength;Decreased balance;Decreased mobility;Decreased activity tolerance;Decreased cognition;Decreased coordination;Cardiopulmonary status limiting activity          PT Treatment Interventions DME instruction;Gait training;Stair training;Functional mobility training;Therapeutic activities;Therapeutic exercise;Balance training;Neuromuscular re-education;Cognitive remediation;Patient/family education    PT Goals (Current goals can be found in the Care Plan section)  Acute Rehab PT Goals Patient Stated Goal: to go home and walk the dog PT Goal Formulation: With patient/family Time For Goal Achievement: 11/18/15 Potential to Achieve Goals: Good    Frequency Min 3X/week   Barriers to discharge        Co-evaluation               End of Session Equipment Utilized During Treatment: Gait belt Activity Tolerance: Patient tolerated treatment well Patient left: in chair;with call bell/phone within reach;with chair alarm set;with family/visitor present Nurse Communication: Mobility status         Time: 0832-0901 PT Time Calculation (min) (ACUTE ONLY): 29 min   Charges:   PT Evaluation $PT Eval Moderate Complexity: 1 Procedure     PT G CodesDuncan Dull 11/04/2015, 9:07 AM Alben Deeds, PT DPT  [phone]

## 2015-11-04 NOTE — Progress Notes (Signed)
Assessment:  Pt awake this Am. Able to converse, but does not seem to follow conversation well. Asked by wife to hiold off conversation re: renal mass until she is at bedside this weekend, so not discussed this AM. However, RN reports that  Primary care did mention renal cyst to him. ? Understanding.   Plan:  I will come in this weekend to see pt and family to review with him his right renal mass, and review our family consultation-if he continues to improve, and can follow conversation.     Subjective:  1. Meningitis: IV penicillin. Viral cultures negative to date. For PICC line for extended PCN Rx ( per RN). Group B strep Rx.                        2. R ear infection: Cipro eardrops                       3. Right solid renal mass and renal cyst.   Objective: Vital signs in last 24 hours: Temp:  [97.6 F (36.4 C)-99.5 F (37.5 C)] 97.6 F (36.4 C) (11/03 0341) Pulse Rate:  [58-123] 101 (11/03 0704) Resp:  [18-32] 21 (11/03 0704) BP: (125-179)/(65-99) 158/84 (11/03 0704) SpO2:  [95 %-100 %] 100 % (11/03 0704) Weight:  [91.2 kg (201 lb)] 91.2 kg (201 lb) (11/03 0341)A  Intake/Output from previous day: 11/02 0701 - 11/03 0700 In: 1663.7 [P.O.:150; I.V.:13.7; IV Piggyback:1500] Out: 1650 [Urine:1425; Stool:225] Intake/Output this shift: No intake/output data recorded.  Past Medical History:  Diagnosis Date  . CVA (cerebral infarction)    hemmorhagic  CVA  . Dyslipidemia   . Endocarditis    MV Vegitation  . Mitral regurgitation   . Myocardial infarction   . Stroke [facility])     Physical Exam:  Lungs - Normal respiratory effort, chest expands symmetrically.  Abdomen - Soft, non-tender & non-distended.  Lab Results:  Recent Labs  11/02/15 0524 11/03/15 0247  WBC 11.9* 12.3*  HGB 12.6* 13.9  HCT 36.8* 40.2   BMET  Recent Labs  11/02/15 0524 11/03/15 0247  NA 138 135  K 3.9 3.4*  CL 106 103  CO2 23 21*  GLUCOSE 137* 96  BUN 18 17  CREATININE 0.77 0.83   CALCIUM 8.4* 8.5*   No results for input(s): LABURIN in the last 72 hours. Results for orders placed or performed during the hospital encounter of 10/29/15  Urine culture     Status: None   Collection Time: 10/29/15  3:55 AM  Result Value Ref Range Status   Specimen Description URINE, RANDOM  Final   Special Requests NONE  Final   Culture NO GROWTH  Final   Report Status 10/30/2015 FINAL  Final  Blood Culture (routine x 2)     Status: Abnormal   Collection Time: 10/29/15  4:00 AM  Result Value Ref Range Status   Specimen Description BLOOD LEFT ARM  Final   Special Requests BOTTLES DRAWN AEROBIC AND ANAEROBIC 5ML  Final   Culture  Setup Time   Final    GRAM POSITIVE COCCI IN CHAINS IN BOTH AEROBIC AND ANAEROBIC BOTTLES CRITICAL RESULT CALLED TO, READ BACK BY AND VERIFIED WITH: R RUMBARGER,PHARMD AT [id number] 10/29/15 BY L BENFIELD    Culture GROUP B STREP(S.AGALACTIAE)ISOLATED (A)  Final   Report Status 10/31/2015 FINAL  Final   Organism ID, Bacteria GROUP B STREP(S.AGALACTIAE)ISOLATED  Final      Susceptibility  Group b strep(s.agalactiae)isolated - MIC*    CLINDAMYCIN <=0.25 SENSITIVE Sensitive     AMPICILLIN <=0.25 SENSITIVE Sensitive     ERYTHROMYCIN <=0.12 SENSITIVE Sensitive     VANCOMYCIN 0.5 SENSITIVE Sensitive     CEFTRIAXONE <=0.12 SENSITIVE Sensitive     LEVOFLOXACIN 1 SENSITIVE Sensitive     * GROUP B STREP(S.AGALACTIAE)ISOLATED  Blood Culture ID Panel (Reflexed)     Status: Abnormal   Collection Time: 10/29/15  4:00 AM  Result Value Ref Range Status   Enterococcus species NOT DETECTED NOT DETECTED Final   Listeria monocytogenes NOT DETECTED NOT DETECTED Final   Staphylococcus species NOT DETECTED NOT DETECTED Final   Staphylococcus aureus NOT DETECTED NOT DETECTED Final   Streptococcus species DETECTED (A) NOT DETECTED Final    Comment: CRITICAL RESULT CALLED TO, READ BACK BY AND VERIFIED WITH: R RUMBARGER,PHARMD AT 1541 10/29/15 BY L BENFIELD    Streptococcus  agalactiae DETECTED (A) NOT DETECTED Final    Comment: CRITICAL RESULT CALLED TO, READ BACK BY AND VERIFIED WITH: R RUMBARGER,PHARMD AT 1541 10/29/15 BY L BENFIELD    Streptococcus pneumoniae NOT DETECTED NOT DETECTED Final   Streptococcus pyogenes NOT DETECTED NOT DETECTED Final   Acinetobacter baumannii NOT DETECTED NOT DETECTED Final   Enterobacteriaceae species NOT DETECTED NOT DETECTED Final   Enterobacter cloacae complex NOT DETECTED NOT DETECTED Final   Escherichia coli NOT DETECTED NOT DETECTED Final   Klebsiella oxytoca NOT DETECTED NOT DETECTED Final   Klebsiella pneumoniae NOT DETECTED NOT DETECTED Final   Proteus species NOT DETECTED NOT DETECTED Final   Serratia marcescens NOT DETECTED NOT DETECTED Final   Haemophilus influenzae NOT DETECTED NOT DETECTED Final   Neisseria meningitidis NOT DETECTED NOT DETECTED Final   Pseudomonas aeruginosa NOT DETECTED NOT DETECTED Final   Candida albicans NOT DETECTED NOT DETECTED Final   Candida glabrata NOT DETECTED NOT DETECTED Final   Candida krusei NOT DETECTED NOT DETECTED Final   Candida parapsilosis NOT DETECTED NOT DETECTED Final   Candida tropicalis NOT DETECTED NOT DETECTED Final  Blood Culture (routine x 2)     Status: Abnormal   Collection Time: 10/29/15  4:15 AM  Result Value Ref Range Status   Specimen Description BLOOD LEFT ARM  Final   Special Requests IN PEDIATRIC BOTTLE 2ML  Final   Culture  Setup Time   Final    GRAM POSITIVE COCCI IN CHAINS IN PEDIATRIC BOTTLE CRITICAL RESULT CALLED TO, READ BACK BY AND VERIFIED WITH: R RUMBARGER,PHARMD AT 1541 10/29/15 BY L BENFIELD    Culture (A)  Final    GROUP B STREP(S.AGALACTIAE)ISOLATED SUSCEPTIBILITIES PERFORMED ON PREVIOUS CULTURE WITHIN THE LAST 5 DAYS.    Report Status 10/31/2015 FINAL  Final  MRSA PCR Screening     Status: None   Collection Time: 10/29/15  6:39 AM  Result Value Ref Range Status   MRSA by PCR NEGATIVE NEGATIVE Final    Comment:        The  GeneXpert MRSA Assay (FDA approved for NASAL specimens only), is one component of a comprehensive MRSA colonization surveillance program. It is not intended to diagnose MRSA infection nor to guide or monitor treatment for MRSA infections.   Respiratory Panel by PCR     Status: None   Collection Time: 10/29/15 11:15 AM  Result Value Ref Range Status   Adenovirus NOT DETECTED NOT DETECTED Final   Coronavirus 229E NOT DETECTED NOT DETECTED Final   Coronavirus HKU1 NOT DETECTED NOT DETECTED Final   Coronavirus  NL63 NOT DETECTED NOT DETECTED Final   Coronavirus OC43 NOT DETECTED NOT DETECTED Final   Metapneumovirus NOT DETECTED NOT DETECTED Final   Rhinovirus / Enterovirus NOT DETECTED NOT DETECTED Final   Influenza A NOT DETECTED NOT DETECTED Final   Influenza B NOT DETECTED NOT DETECTED Final   Parainfluenza Virus 1 NOT DETECTED NOT DETECTED Final   Parainfluenza Virus 2 NOT DETECTED NOT DETECTED Final   Parainfluenza Virus 3 NOT DETECTED NOT DETECTED Final   Parainfluenza Virus 4 NOT DETECTED NOT DETECTED Final   Respiratory Syncytial Virus NOT DETECTED NOT DETECTED Final   Bordetella pertussis NOT DETECTED NOT DETECTED Final   Chlamydophila pneumoniae NOT DETECTED NOT DETECTED Final   Mycoplasma pneumoniae NOT DETECTED NOT DETECTED Final  Culture, respiratory (NON-Expectorated)     Status: None   Collection Time: 10/30/15 11:22 AM  Result Value Ref Range Status   Specimen Description TRACHEAL ASPIRATE  Final   Special Requests NONE  Final   Gram Stain   Final    ABUNDANT WBC PRESENT,BOTH PMN AND MONONUCLEAR RARE SQUAMOUS EPITHELIAL CELLS PRESENT RARE GRAM POSITIVE COCCI IN PAIRS RARE GRAM POSITIVE RODS    Culture RARE CANDIDA ALBICANS  Final   Report Status 11/01/2015 FINAL  Final  Culture, blood (Routine X 2) w Reflex to ID Panel     Status: None (Preliminary result)   Collection Time: 10/31/15  9:28 AM  Result Value Ref Range Status   Specimen Description BLOOD RIGHT  ARM  Final   Special Requests IN PEDIATRIC BOTTLE 2CC  Final   Culture NO GROWTH 3 DAYS  Final   Report Status PENDING  Incomplete  Culture, blood (Routine X 2) w Reflex to ID Panel     Status: None (Preliminary result)   Collection Time: 10/31/15  9:32 AM  Result Value Ref Range Status   Specimen Description BLOOD RIGHT HAND  Final   Special Requests IN PEDIATRIC BOTTLE 3CC  Final   Culture NO GROWTH 3 DAYS  Final   Report Status PENDING  Incomplete  CSF culture     Status: None (Preliminary result)   Collection Time: 11/02/15  2:41 PM  Result Value Ref Range Status   Specimen Description CSF  Final   Special Requests NONE  Final   Gram Stain   Final    WBC PRESENT,BOTH PMN AND MONONUCLEAR NO ORGANISMS SEEN CYTOSPIN SMEAR    Culture NO GROWTH < 24 HOURS  Final   Report Status PENDING  Incomplete    Studies/Results: No results found.    Orlena Garmon I Merwyn Hodapp 11/04/2015, 7:45 AM

## 2015-11-04 NOTE — Evaluation (Signed)
Occupational Therapy Evaluation Patient Details Name: [name] MRN: [id number] DOB: [birth year] Today's Date: 11/04/2015    History of Present Illness 60 yo male presented with altered mental status, incontinent of bowel/bladder, fever, and respiratory failure. He had cystoscopy and transrectal u/s for BPH 2 days prior to admission and given cipro for prophylaxis. 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 Noted to have Rt ear infection. Found to have Group B Strep bacteremia and meningitis   Clinical Impression   Pt was independent and active prior to admission. 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 Presents with impaired cognition and safety awareness, generalized weakness and UB incoordination. He required min to max assist with ADL. Pt is an excellent rehab candidate, recommending CIR. Will follow acutely.    Follow Up Recommendations  CIR;Supervision/Assistance - 24 hour    Equipment Recommendations  3 in 1 bedside comode;Tub/shower seat    Recommendations for Other Services       Precautions / Restrictions Precautions Precautions: Fall Restrictions Weight Bearing Restrictions: No      Mobility Bed Mobility Overal bed mobility: Needs Assistance Bed Mobility: Supine to Sit     Supine to sit: Min guard     General bed mobility comments: Min guard for safety, some impulsivitity noted  Transfers Overall transfer level: Needs assistance Equipment used: Rolling walker (2 wheeled) Transfers: Sit to/from Stand Sit to Stand: Min assist         General transfer comment: min assist for stability with elevation to upright positioning    Balance Overall balance assessment: Needs assistance Sitting-balance support: Feet supported Sitting balance-Leahy Scale: Fair     Standing balance support: Bilateral upper extremity supported Standing balance-Leahy Scale: Poor Standing balance comment: heavy reliance  on RW for UE support, posterior lean against bed for stabilization. Decreased righting reactions             High level balance activites: Direction changes;Turns High Level Balance Comments: patient with difficulty making turns using RW, loss of balance noted x3, moderate assist to stabilize            ADL Overall ADL's : Needs assistance/impaired Eating/Feeding: Supervision/ safety;Sitting Eating/Feeding Details (indicate cue type and reason): increased spillage with spoon, instructed wife to allow pt to self feed Grooming: Wash/dry hands;Wash/dry face;Sitting;Minimal assistance Grooming Details (indicate cue type and reason): decreased thoroughness Upper Body Bathing: Moderate assistance;Sitting   Lower Body Bathing: Moderate assistance;Sit to/from stand   Upper Body Dressing : Moderate assistance;Sitting   Lower Body Dressing: Moderate assistance;Sit to/from stand       Toileting- Water quality scientist and Hygiene: Maximal assistance;Sit to/from stand       Functional mobility during ADLs: Rolling walker;Moderate assistance General ADL Comments: Pt in urine soaked bed without awareness.     Vision     Perception     Praxis      Pertinent Vitals/Pain Pain Assessment: No/denies pain     Hand Dominance Right   Extremity/Trunk Assessment Upper Extremity Assessment Upper Extremity Assessment: RUE deficits/detail;LUE deficits/detail RUE Deficits / Details: generalized weakness, incoordination LUE Deficits / Details: generalized weakness, incoordination   Lower Extremity Assessment Lower Extremity Assessment: Generalized weakness       Communication Communication Communication: HOH   Cognition Arousal/Alertness: Awake/alert Behavior During Therapy: Impulsive Overall Cognitive Status: Impaired/Different from baseline Area of Impairment: Orientation;Attention;Following commands;Safety/judgement;Awareness;Problem solving Orientation Level: Disoriented  to;Situation Current Attention Level: Selective   Following Commands: Follows one step commands  consistently Safety/Judgement: Decreased awareness of safety;Decreased awareness of deficits Awareness: Emergent Problem Solving: Slow processing;Difficulty sequencing;Requires verbal cues;Requires tactile cues     General Comments       Exercises       Shoulder Instructions      Home Living Family/patient expects to be discharged to:: Private residence Living Arrangements: Spouse/significant other Available Help at Discharge: Family;Available 24 hours/day Type of Home: House Home Access: Stairs to enter [employer] of Steps: 3 Entrance Stairs-Rails: None Home Layout: Two level Alternate Level Stairs-Number of Steps: 15 Alternate Level Stairs-Rails: Can reach both Bathroom Shower/Tub: Occupational psychologist: Handicapped height     Home Equipment: None          Prior Functioning/Environment Level of Independence: Independent        Comments: just finished a bike tour of [place]        OT Problem List: Decreased strength;Decreased activity tolerance;Impaired balance (sitting and/or standing);Decreased coordination;Decreased cognition;Decreased safety awareness;Decreased knowledge of use of DME or AE;Cardiopulmonary status limiting activity;Impaired UE functional use   OT Treatment/Interventions: Self-care/ADL training;DME and/or AE instruction;Therapeutic activities;Cognitive remediation/compensation;Patient/family education;Balance training;Therapeutic exercise    OT Goals(Current goals can be found in the care plan section) Acute Rehab OT Goals Patient Stated Goal: to go home and walk the dog OT Goal Formulation: With patient Time For Goal Achievement: 11/18/15 Potential to Achieve Goals: Good  OT Frequency: Min 2X/week   Barriers to D/C:            Co-evaluation PT/OT/SLP Co-Evaluation/Treatment: Yes Reason for Co-Treatment: Complexity  of the patient's impairments (multi-system involvement) PT goals addressed during session: Mobility/safety with mobility OT goals addressed during session: ADL's and self-care      End of Session Equipment Utilized During Treatment: Rolling walker;Gait belt Nurse Communication: Mobility status (ok to have milkshake)  Activity Tolerance: Patient limited by fatigue Patient left: in chair;with call bell/phone within reach;with chair alarm set;with family/visitor present   Time: 0822-0858 OT Time Calculation (min): 36 min Charges:  OT General Charges $OT Visit: 1 Procedure OT Evaluation $OT Eval Moderate Complexity: 1 Procedure G-Codes:    Malka So 11/04/2015, 10:43 AM  [phone]

## 2015-11-04 NOTE — Progress Notes (Addendum)
PULMONARY / CRITICAL CARE MEDICINE   Name: [name] MRN: [id number] DOB: [birth year]    ADMISSION DATE:  10/29/2015  CHIEF COMPLAINT:  AMS  HISTORY OF PRESENT ILLNESS:   60 yo male presented with altered mental status, incontinent of bowel/bladder, fever, and respiratory failure.  He had cystoscopy and transrectal u/s for BPH 2 days prior to admission and given cipro for prophylaxis.  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  Noted to have Rt ear infection.  Found to have Group B Strep bacteremia and meningitis.  SUBJECTIVE:  More alert.  Mild Rt ear pain.  Denies headache, sinus congestion.  VITAL SIGNS: BP (!) 160/84 (BP Location: Right Arm)   Pulse (!) 109   Temp 97.6 F (36.4 C) (Oral)   Resp (!) 34   Ht 5\' 8"  (1.727 m)   Wt 201 lb (91.2 kg)   SpO2 100%   BMI 30.56 kg/m   INTAKE / OUTPUT: I/O last 3 completed shifts: In: 2958 [P.O.:150; I.V.:308; IV Piggyback:2500] Out: 2525 [Urine:2200; Stool:325]  PHYSICAL EXAMINATION: General: alert Neuro: follows commands, moves extremities HEENT: no stridor Cardiac: regular, 2/6 SM Chest: no wheeze Abd: soft, non tender Ext: no edema Skin: no rashes  LABS:  BMET  Recent Labs Lab 11/01/15 0146 11/02/15 0524 11/03/15 0247  NA 142 138 135  K 4.0 3.9 3.4*  CL 108 106 103  CO2 26 23 21*  BUN 23* 18 17  CREATININE 1.02 0.77 0.83  GLUCOSE 123* 137* 96   Electrolytes  Recent Labs Lab 11/01/15 0146 11/02/15 0524 11/03/15 0247  CALCIUM 8.7* 8.4* 8.5*  MG 2.1 1.9 2.0  PHOS 3.3 2.5 2.6   CBC  Recent Labs Lab 11/01/15 0146 11/02/15 0524 11/03/15 0247  WBC 13.4* 11.9* 12.3*  HGB 12.6* 12.6* 13.9  HCT 39.2 36.8* 40.2  PLT 184 223 257   Coag's  Recent Labs Lab 10/29/15 0355  APTT 25  INR 1.08   Sepsis Markers  Recent Labs Lab 10/29/15 0417 10/29/15 1127 10/30/15 0446  LATICACIDVEN 3.20* 3.0* 1.7   ABG  Recent Labs Lab 10/31/15 0435 11/01/15 0425  11/02/15 0439  PHART 7.437 7.436 7.503*  PCO2ART 38.1 38.7 31.8*  PO2ART 131* 128* 59.1*   Liver Enzymes  Recent Labs Lab 10/29/15 0355  AST 35  ALT 35  ALKPHOS 85  BILITOT 1.1  ALBUMIN 4.6   Cardiac Enzymes  Recent Labs Lab 10/29/15 1117 10/29/15 1644 10/29/15 2331  TROPONINI 0.10* 0.10* 0.08*   Glucose  Recent Labs Lab 11/02/15 0500 11/02/15 0816 11/02/15 1109 11/02/15 1606 11/02/15 2003 11/03/15 0008  GLUCAP 103* 87 92 114* 92 105*   Imaging No results found.   STUDIES:  CT head/neck 10/28 >> old Lt temporal infarct with encephalomalacia TEE 10/30 >> EF 60 to 65%, aortic root 41 mm, mod MR, no vegetations EEG 10/30 >> moderate slowing CT abd/pelvis 10/31 >> Rt renal mass 53 mm, 14 mm liver lucency MRI brain 11/01 >> motion artifact, ? Ventriculitis, remote infarcts Rt external capsule and Lt temporal lobe LP 11/01 >> glucose 41, protein 130, RBC 5, WBC 158 (41N, 30L, 33M, 1E)  CULTURES: Blood 10/28 >> Group B Strep Respiratory viral panel 10/28 >> negative Urine 10/28 >> negative Sputum 10/29 >> negative Blood 10/30 >> CSF 11/01 >>  CSF HSV 11/01 >> negative  ANTIBIOTICS: Vancomycin 10/28 >> 10/30 Zosyn 10/28 >> 10/30 Pencillin 10/30 >> Cipro ear drops 11/02 >>   SIGNIFICANT EVENTS: 10/28  intubated in the ED for agitation 10/29 cardiology consulted for TEE, ID consulted for bacteremia 11/01 urology consulted, LP by IR  LINES/TUBES: ETT 10/28 >> 10/31  DISCUSSION: 60 yo with altered mental status from Group B Strep bacteremia after cystoscopy and transrectal ultrasound.  Found to have Rt otitis, and meningitis.  Also found to have right renal mass.  ASSESSMENT / PLAN:  Acute encephalopathy 2nd to meningitis >> much improved. - monitor mental status - hold outpt bupropion for now  Group B Strep bacteremia. Rt Otitis Externa. Meningitis. - Abx per ID - will order PICC line 11/03 - will need Abx through 11/13/15 for bacteremia per  ID  Right renal mass. Hx of BPH. - f/u with urology  Elevated blood pressure, sinus tachycardia. Hx of HLD. Hx of MVP. - add lopressor 11/03 - continue outpt crestor  Nutrition. Dysphagia. - D1 diet - f/u with speech therapy  Deconditioning. - PT, OT  Resolved problems >> compromise airway, lactic acidosis  DVT prophylaxis - Lovenox, SCDs SUP - No indicated Goal of care - full code  Transfer to tele floor bed 11/03 >> to [facility] 11/04 and PCCM off.  Chesley Mires, MD [facility] Pulmonary/Critical Care 11/04/2015, 8:38 AM Pager:  [phone] After 3pm call: [phone]

## 2015-11-04 NOTE — Progress Notes (Signed)
Speech Language Pathology Treatment: Dysphagia  Patient Details Name: [name] MRN: [id number] DOB: 1955/02/18 Today's Date: 11/04/2015 Time: [id number] SLP Time Calculation (min) (ACUTE ONLY): 19 min  Assessment / Plan / Recommendation Clinical Impression  Pt consumed regular textures and thin liquids without oral holding as observed during initial evaluation. He does however have left-sided oral residue and anterior loss that is very mild, but occurs without pt awareness. Min cues provided by therapist for self-monitoring and correcting. RN present to administer medications whole with thin liquid without overt difficulty. Recommend to advance diet to Dys 3 textures, continuing thin liquids. Pt will continue to need full supervision due to cognitive deficits, including decreased selective attention, emergent awareness, and impulsivity.   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 In the ED he was found to be febrile and combative and required intubation 10/28. Two days prior to his presentation he had a urologic procedure done (details unclear), but it was a trans-urethral and trans-rectal approach to address his prostatic hypertrophy. Extubated 10/31. Dx with sepsis, group B strep bacteremia. Incidental findings of renal mass suspicious for malignancy.      SLP Plan  Continue with current plan of care     Recommendations  Diet recommendations: Dysphagia 3 (mechanical soft);Thin liquid Liquids provided via: Cup;Straw Medication Administration: Whole meds with liquid Supervision: Patient able to self feed;Full supervision/cueing for compensatory strategies Compensations: Slow rate;Small sips/bites Postural Changes and/or Swallow Maneuvers: Seated upright 90 degrees;Upright 30-60 min after meal                Oral Care Recommendations: Oral care BID Follow  up Recommendations: None Plan: Continue with current plan of care       GO                [name] 11/04/2015, 10:12 AM  [name], M.A. CCC-SLP [phone]

## 2015-11-04 NOTE — Progress Notes (Addendum)
[facility] RN called, pt K 3.3, stated will address with MD Oletta Darter

## 2015-11-04 NOTE — Progress Notes (Signed)
Pt with tx orders to [facility], pt VSS, family at [facility], pt and family verbalized understanding of tx, report called to receiving RN, all questions answered

## 2015-11-04 NOTE — Progress Notes (Signed)
eLink Physician-Brief Progress Note Patient Name: Jammie Kalan DOB: Apr 24, 1955 MRN: [id number]   Date of Service  11/04/2015  HPI/Events of Note  K+ = 3.3 and Creatinine = 0.9.  eICU Interventions  Will replace K+.     Intervention Category Intermediate Interventions: Electrolyte abnormality - evaluation and management  Sommer,Steven Eugene 11/04/2015, 4:06 PM

## 2015-11-04 NOTE — Progress Notes (Signed)
[name] for Infectious Disease   Reason for visit: Follow up on GBS bacteremia  Interval History:  repeat blood cultures ngtd; now afebrile  Physical Exam: Constitutional:  Vitals:   11/04/15 1000 11/04/15 1153  BP: 116/82 131/79  Pulse: (!) 109 80  Resp:  (!) 25  Temp:  97.8 F (36.6 C)   patient appears in NAD, sleepy but arousable Eyes: anicteric HENT: no thrush; right ear with crusty discharge from canal Respiratory: Normal respiratory effort; CTA B Cardiovascular: RRR GI: soft, nt, nd  Review of Systems: Constitutional: fatigue  Lab Results  Component Value Date   WBC 12.3 (H) 11/03/2015   HGB 13.9 11/03/2015   HCT 40.2 11/03/2015   MCV 85.4 11/03/2015   PLT 257 11/03/2015    Lab Results  Component Value Date   CREATININE 0.90 11/04/2015   BUN 13 11/04/2015   NA 135 11/04/2015   K 3.3 (L) 11/04/2015   CL 102 11/04/2015   CO2 23 11/04/2015    Lab Results  Component Value Date   ALT 35 10/29/2015   AST 35 10/29/2015   ALKPHOS 85 10/29/2015     Microbiology: Recent Results (from the past 240 hour(s))  Urine culture     Status: None   Collection Time: 10/29/15  3:55 AM  Result Value Ref Range Status   Specimen Description URINE, RANDOM  Final   Special Requests NONE  Final   Culture NO GROWTH  Final   Report Status 10/30/2015 FINAL  Final  Blood Culture (routine x 2)     Status: Abnormal   Collection Time: 10/29/15  4:00 AM  Result Value Ref Range Status   Specimen Description BLOOD LEFT ARM  Final   Special Requests BOTTLES DRAWN AEROBIC AND ANAEROBIC 5ML  Final   Culture  Setup Time   Final    GRAM POSITIVE COCCI IN CHAINS IN BOTH AEROBIC AND ANAEROBIC BOTTLES CRITICAL RESULT CALLED TO, READ BACK BY AND VERIFIED WITH: R RUMBARGER,PHARMD AT 1541 10/29/15 BY L BENFIELD    Culture GROUP B STREP(S.AGALACTIAE)ISOLATED (A)  Final   Report Status 10/31/2015 FINAL  Final   Organism ID, Bacteria GROUP B STREP(S.AGALACTIAE)ISOLATED  Final      Susceptibility   Group b strep(s.agalactiae)isolated - MIC*    CLINDAMYCIN <=0.25 SENSITIVE Sensitive     AMPICILLIN <=0.25 SENSITIVE Sensitive     ERYTHROMYCIN <=0.12 SENSITIVE Sensitive     VANCOMYCIN 0.5 SENSITIVE Sensitive     CEFTRIAXONE <=0.12 SENSITIVE Sensitive     LEVOFLOXACIN 1 SENSITIVE Sensitive     * GROUP B STREP(S.AGALACTIAE)ISOLATED  Blood Culture ID Panel (Reflexed)     Status: Abnormal   Collection Time: 10/29/15  4:00 AM  Result Value Ref Range Status   Enterococcus species NOT DETECTED NOT DETECTED Final   Listeria monocytogenes NOT DETECTED NOT DETECTED Final   Staphylococcus species NOT DETECTED NOT DETECTED Final   Staphylococcus aureus NOT DETECTED NOT DETECTED Final   Streptococcus species DETECTED (A) NOT DETECTED Final    Comment: CRITICAL RESULT CALLED TO, READ BACK BY AND VERIFIED WITH: R RUMBARGER,PHARMD AT 1541 10/29/15 BY L BENFIELD    Streptococcus agalactiae DETECTED (A) NOT DETECTED Final    Comment: CRITICAL RESULT CALLED TO, READ BACK BY AND VERIFIED WITH: R RUMBARGER,PHARMD AT 1541 10/29/15 BY L BENFIELD    Streptococcus pneumoniae NOT DETECTED NOT DETECTED Final   Streptococcus pyogenes NOT DETECTED NOT DETECTED Final   Acinetobacter baumannii NOT DETECTED NOT DETECTED Final  Enterobacteriaceae species NOT DETECTED NOT DETECTED Final   Enterobacter cloacae complex NOT DETECTED NOT DETECTED Final   Escherichia coli NOT DETECTED NOT DETECTED Final   Klebsiella oxytoca NOT DETECTED NOT DETECTED Final   Klebsiella pneumoniae NOT DETECTED NOT DETECTED Final   Proteus species NOT DETECTED NOT DETECTED Final   Serratia marcescens NOT DETECTED NOT DETECTED Final   Haemophilus influenzae NOT DETECTED NOT DETECTED Final   Neisseria meningitidis NOT DETECTED NOT DETECTED Final   Pseudomonas aeruginosa NOT DETECTED NOT DETECTED Final   Candida albicans NOT DETECTED NOT DETECTED Final   Candida glabrata NOT DETECTED NOT DETECTED Final   Candida  krusei NOT DETECTED NOT DETECTED Final   Candida parapsilosis NOT DETECTED NOT DETECTED Final   Candida tropicalis NOT DETECTED NOT DETECTED Final  Blood Culture (routine x 2)     Status: Abnormal   Collection Time: 10/29/15  4:15 AM  Result Value Ref Range Status   Specimen Description BLOOD LEFT ARM  Final   Special Requests IN PEDIATRIC BOTTLE 2ML  Final   Culture  Setup Time   Final    GRAM POSITIVE COCCI IN CHAINS IN PEDIATRIC BOTTLE CRITICAL RESULT CALLED TO, READ BACK BY AND VERIFIED WITH: R RUMBARGER,PHARMD AT 1541 10/29/15 BY L BENFIELD    Culture (A)  Final    GROUP B STREP(S.AGALACTIAE)ISOLATED SUSCEPTIBILITIES PERFORMED ON PREVIOUS CULTURE WITHIN THE LAST 5 DAYS.    Report Status 10/31/2015 FINAL  Final  MRSA PCR Screening     Status: None   Collection Time: 10/29/15  6:39 AM  Result Value Ref Range Status   MRSA by PCR NEGATIVE NEGATIVE Final    Comment:        The GeneXpert MRSA Assay (FDA approved for NASAL specimens only), is one component of a comprehensive MRSA colonization surveillance program. It is not intended to diagnose MRSA infection nor to guide or monitor treatment for MRSA infections.   Respiratory Panel by PCR     Status: None   Collection Time: 10/29/15 11:15 AM  Result Value Ref Range Status   Adenovirus NOT DETECTED NOT DETECTED Final   Coronavirus 229E NOT DETECTED NOT DETECTED Final   Coronavirus HKU1 NOT DETECTED NOT DETECTED Final   Coronavirus NL63 NOT DETECTED NOT DETECTED Final   Coronavirus OC43 NOT DETECTED NOT DETECTED Final   Metapneumovirus NOT DETECTED NOT DETECTED Final   Rhinovirus / Enterovirus NOT DETECTED NOT DETECTED Final   Influenza A NOT DETECTED NOT DETECTED Final   Influenza B NOT DETECTED NOT DETECTED Final   Parainfluenza Virus 1 NOT DETECTED NOT DETECTED Final   Parainfluenza Virus 2 NOT DETECTED NOT DETECTED Final   Parainfluenza Virus 3 NOT DETECTED NOT DETECTED Final   Parainfluenza Virus 4 NOT DETECTED NOT  DETECTED Final   Respiratory Syncytial Virus NOT DETECTED NOT DETECTED Final   Bordetella pertussis NOT DETECTED NOT DETECTED Final   Chlamydophila pneumoniae NOT DETECTED NOT DETECTED Final   Mycoplasma pneumoniae NOT DETECTED NOT DETECTED Final  Culture, respiratory (NON-Expectorated)     Status: None   Collection Time: 10/30/15 11:22 AM  Result Value Ref Range Status   Specimen Description TRACHEAL ASPIRATE  Final   Special Requests NONE  Final   Gram Stain   Final    ABUNDANT WBC PRESENT,BOTH PMN AND MONONUCLEAR RARE SQUAMOUS EPITHELIAL CELLS PRESENT RARE GRAM POSITIVE COCCI IN PAIRS RARE GRAM POSITIVE RODS    Culture RARE CANDIDA ALBICANS  Final   Report Status 11/01/2015 FINAL  Final  Culture, blood (Routine X 2) w Reflex to ID Panel     Status: None (Preliminary result)   Collection Time: 10/31/15  9:28 AM  Result Value Ref Range Status   Specimen Description BLOOD RIGHT ARM  Final   Special Requests IN PEDIATRIC BOTTLE 2CC  Final   Culture NO GROWTH 3 DAYS  Final   Report Status PENDING  Incomplete  Culture, blood (Routine X 2) w Reflex to ID Panel     Status: None (Preliminary result)   Collection Time: 10/31/15  9:32 AM  Result Value Ref Range Status   Specimen Description BLOOD RIGHT HAND  Final   Special Requests IN PEDIATRIC BOTTLE 3CC  Final   Culture NO GROWTH 3 DAYS  Final   Report Status PENDING  Incomplete  CSF culture     Status: None (Preliminary result)   Collection Time: 11/02/15  2:41 PM  Result Value Ref Range Status   Specimen Description CSF  Final   Special Requests NONE  Final   Gram Stain   Final    WBC PRESENT,BOTH PMN AND MONONUCLEAR NO ORGANISMS SEEN CYTOSPIN SMEAR    Culture NO GROWTH < 24 HOURS  Final   Report Status PENDING  Incomplete  Fungus Culture With Stain     Status: None (Preliminary result)   Collection Time: 11/02/15  2:41 PM  Result Value Ref Range Status   Fungus Stain Final report  Final    Comment: (NOTE) Performed At:  [facility] [place], [place] [id number] Lindon Romp MD [id number]    Fungus (Mycology) Culture PENDING  Incomplete   Fungal Source CSF  Final  Fungus Culture Result     Status: None   Collection Time: 11/02/15  2:41 PM  Result Value Ref Range Status   Result 1 Comment  Final    Comment: (NOTE) KOH/Calcofluor preparation:  no fungus observed. Performed At: [facility] [place], [place] [id number] Lindon Romp MD [id number]     Impression/Plan:  1. GBS bacteremia - fortunately TEE negative for vegetation.  On penicillin and improving.  Will need IV treatment for 2 weeks total from negative blood culture 10/30 (if it remains negative) through November 12th.   Welch for picc line today and ordered by CCM Ok to pull picc line after treatment completion on November 12th Going for inpatient rehab  2. Renal mass - urology following and will need surgery as outpatient  3.  AMS - much improved  4. Otitis externa - discharge from ear, on ciprodex and improving. Continue through 11/7 and stop.    I will sign off, please call with any questions. thanks

## 2015-11-04 NOTE — Progress Notes (Signed)
Rehab Admissions Coordinator Note:  Patient was screened by Retta Diones for appropriateness for an Inpatient Acute Rehab Consult.  At this time, we are recommending Inpatient Rehab consult.  Retta Diones 11/04/2015, 9:30 AM  I can be reached at [phone].

## 2015-11-05 DIAGNOSIS — R748 Abnormal levels of other serum enzymes: Secondary | ICD-10-CM

## 2015-11-05 LAB — BASIC METABOLIC PANEL
Anion gap: 10 (ref 5–15)
BUN: 16 mg/dL (ref 6–20)
CHLORIDE: 102 mmol/L (ref 101–111)
CO2: 19 mmol/L — ABNORMAL LOW (ref 22–32)
CREATININE: 0.9 mg/dL (ref 0.61–1.24)
Calcium: 8.8 mg/dL — ABNORMAL LOW (ref 8.9–10.3)
GFR calc non Af Amer: >60 mL/min (ref >60)
Glucose, Bld: 120 mg/dL — ABNORMAL HIGH (ref 65–99)
POTASSIUM: 4 mmol/L (ref 3.5–5.1)
SODIUM: 131 mmol/L — AB (ref 135–145)

## 2015-11-05 LAB — CULTURE, BLOOD (ROUTINE X 2)
CULTURE: NO GROWTH
CULTURE: NO GROWTH

## 2015-11-05 LAB — CSF CULTURE W GRAM STAIN

## 2015-11-05 LAB — CSF CULTURE: CULTURE: NO GROWTH

## 2015-11-05 MED ORDER — SODIUM CHLORIDE 0.9% FLUSH
10.0000 mL | INTRAVENOUS | Status: DC | PRN
  Administered 2015-11-06: 10 mL
  Filled 2015-11-05: qty 40

## 2015-11-05 NOTE — Progress Notes (Signed)
PROGRESS NOTE  [name] [id number] DOB: November 08, 1955 DOA: 10/29/2015 PCP: Henrine Screws, MD   LOS: 7 days   Brief Narrative: 60 yo male presented with altered mental status, incontinent of bowel/bladder, fever, and respiratory failure.  He had cystoscopy and transrectal u/s for BPH 2 days prior to admission and given cipro for prophylaxis.  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  Noted to have Rt ear infection.  Found to have Group B Strep bacteremia and meningitis.  SIGNIFICANT EVENTS: 10/28 intubated in the ED for agitation 10/29 cardiology consulted for TEE, ID consulted for bacteremia 11/01 urology consulted, LP by IR  Assessment & Plan: Active Problems:   Sepsis ([place])   Delirium   Respiratory failure ([facility])   Acute encephalopathy   Elevated troponin   MVP (mitral valve prolapse)   Mitral valve insufficiency   Bacteremia   GBS bacteremia - ID consulted, appreciate Dr. Henreitta Leber input - TEE negative for bacteremia  - on penicillin, continue. PICC line placed 11/4 - Patient will need 2 weeks of IV penicillin based on negative blood cultures on 10/30, plan to continue through 11/13/2015  Acute encephalopathy 2nd to meningitis >> much improved. 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 Urology is following, appreciate input  Prior CVAs - No new neurologic findings, patient recovered well after these CVAs in the past  Hx of BPH - f/u with urology  Elevated blood pressure, sinus tachycardia - Now on metoprolol, improved  Hx of HLD - continue outpt crestor  Hx of MVP - added lopressor 11/03  Nutrition  Dysphagia - D1  diet - f/u with speech therapy  Deconditioning - PT, OT   DVT prophylaxis: Lovenox Code Status: Full Family Communication: d/w friend bedside Disposition Plan: CIR to evaluate  Consultants:   ID  Urology  PCCM  CULTURES: Blood 10/28 >> Group B Strep Respiratory viral panel 10/28 >> negative Urine 10/28 >> negative Sputum 10/29 >> negative Blood 10/30 >> CSF 11/01 >>  CSF HSV 11/01 >> negative  ANTIBIOTICS: Vancomycin 10/28 >> 10/30 Zosyn 10/28 >> 10/30 Pencillin 10/30 >> Cipro ear drops 11/02 >>   LINES/TUBES: ETT 10/28 >> 10/31  Subjective: - no chest pain, shortness of breath, no abdominal pain, nausea or vomiting. Wants to go home  Objective: Vitals:   11/04/15 2000 11/05/15 0300 11/05/15 0400 11/05/15 0918  BP: 118/66  (!) 158/89 (!) 148/72  Pulse: 88  78 85  Resp: 16  18   Temp: 98 F (36.7 C)  98.1 F (36.7 C)   TempSrc: Oral  Oral   SpO2: 98%  95%   Weight:  109 kg (240 lb 4.8 oz)    Height:        Intake/Output Summary (Last 24 hours) at 11/05/15 1308 Last data filed at 11/05/15 0900  Gross per 24 hour  Intake              620 ml  Output              200 ml  Net              420 ml   [employer]  11/03/15 0557 11/04/15 0341 11/05/15 0300  Weight: 90.7 kg (200 lb) 91.2 kg (201 lb) 109 kg (240 lb 4.8 oz)    Examination: Constitutional: NAD Vitals:   11/04/15 2000 11/05/15 0300 11/05/15 0400 11/05/15 0918  BP: 118/66  (!) 158/89 (!) 148/72  Pulse: 88  78 85  Resp: 16  18   Temp: 98 F (36.7 C)  98.1 F (36.7 C)   TempSrc: Oral  Oral   SpO2: 98%  95%   Weight:  109 kg (240 lb 4.8 oz)    Height:       Eyes: PERRL, lids and conjunctivae normal Respiratory: clear to auscultation bilaterally, no wheezing, no crackles.  Cardiovascular: Regular rate and rhythm, no murmurs / rubs / gallops. No LE edema. 2+ pedal pulses. No carotid bruits.  Abdomen: no tenderness. Bowel sounds positive.  Musculoskeletal: no clubbing / cyanosis.   Skin: no rashes, lesions, ulcers. No induration Neurologic: non focal. AxOx4   Data Reviewed: I have personally reviewed following labs and imaging studies  CBC:  Recent Labs Lab 10/30/15 0446 10/31/15 0111 11/01/15 0146 11/02/15 0524 11/03/15 0247  WBC 21.8* 16.4* 13.4* 11.9* 12.3*  HGB 13.0 12.5* 12.6* 12.6* 13.9  HCT 40.8 38.9* 39.2 36.8* 40.2  MCV 89.7 91.3 92.5 87.2 85.4  PLT 169 180 184 223 [id number]   Basic Metabolic Panel:  Recent Labs Lab 10/30/15 1712 10/31/15 0111 11/01/15 0146 11/02/15 0524 11/03/15 0247 11/04/15 0812 11/05/15 0235  NA  --  142 142 138 135 135 131*  K  --  3.5 4.0 3.9 3.4* 3.3* 4.0  CL  --  110 108 106 103 102 102  CO2  --  25 26 23  21* 23 19*  GLUCOSE  --  123* 123* 137* 96 122* 120*  BUN  --  25* 23* 18 17 13 16   CREATININE  --  1.07 1.02 0.77 0.83 0.90 0.90  CALCIUM  --  8.4* 8.7* 8.4* 8.5* 8.9 8.8*  MG 2.3 2.3 2.1 1.9 2.0  --   --   PHOS 2.9 2.7 3.3 2.5 2.6  --   --    GFR: Estimated Creatinine Clearance: 105.8 mL/min (by C-G formula based on SCr of 0.9 mg/dL). Liver Function Tests: No results for input(s): AST, ALT, ALKPHOS, BILITOT, PROT, ALBUMIN in the last 168 hours. No results for input(s): LIPASE, AMYLASE in the last 168 hours. No results for input(s): AMMONIA in the last 168 hours. Coagulation Profile: No results for input(s): INR, PROTIME in the last 168 hours. Cardiac Enzymes:  Recent Labs Lab 10/29/15 1644 10/29/15 2331  TROPONINI 0.10* 0.08*   BNP (last 3 results) No results for input(s): PROBNP in the last 8760 hours. HbA1C: No results for input(s): HGBA1C in the last 72 hours. CBG:  Recent Labs Lab 11/02/15 0816 11/02/15 1109 11/02/15 1606 11/02/15 2003 11/03/15 0008  GLUCAP 87 92 114* 92 105*   Lipid Profile: No results for input(s): CHOL, HDL, LDLCALC, TRIG, CHOLHDL, LDLDIRECT in the last 72 hours. Thyroid Function Tests: No results for input(s): TSH, T4TOTAL, FREET4, T3FREE, THYROIDAB in the last  72 hours. Anemia Panel: No results for input(s): VITAMINB12, FOLATE, FERRITIN, TIBC, IRON, RETICCTPCT in the last 72 hours. Urine analysis:    Component Value Date/Time   COLORURINE YELLOW 10/29/2015 [place] 10/29/2015 0355   LABSPEC 1.015 10/29/2015 0355   PHURINE 7.5 10/29/2015 0355   GLUCOSEU NEGATIVE 10/29/2015 0355   HGBUR TRACE (A) 10/29/2015 0355   BILIRUBINUR NEGATIVE  10/29/2015 [place] 10/29/2015 0355   PROTEINUR NEGATIVE 10/29/2015 0355   UROBILINOGEN 0.2 03/15/2008 1023   NITRITE NEGATIVE 10/29/2015 0355   LEUKOCYTESUR NEGATIVE 10/29/2015 0355   Sepsis Labs: Invalid input(s): PROCALCITONIN, LACTICIDVEN  Recent Results (from the past 240 hour(s))  Urine culture     Status: None   Collection Time: 10/29/15  3:55 AM  Result Value Ref Range Status   Specimen Description URINE, RANDOM  Final   Special Requests NONE  Final   Culture NO GROWTH  Final   Report Status 10/30/2015 FINAL  Final  Blood Culture (routine x 2)     Status: Abnormal   Collection Time: 10/29/15  4:00 AM  Result Value Ref Range Status   Specimen Description BLOOD LEFT ARM  Final   Special Requests BOTTLES DRAWN AEROBIC AND ANAEROBIC 5ML  Final   Culture  Setup Time   Final    GRAM POSITIVE COCCI IN CHAINS IN BOTH AEROBIC AND ANAEROBIC BOTTLES CRITICAL RESULT CALLED TO, READ BACK BY AND VERIFIED WITH: R RUMBARGER,PHARMD AT [id number] 10/29/15 BY L BENFIELD    Culture GROUP B STREP(S.AGALACTIAE)ISOLATED (A)  Final   Report Status 10/31/2015 FINAL  Final   Organism ID, Bacteria GROUP B STREP(S.AGALACTIAE)ISOLATED  Final      Susceptibility   Group b strep(s.agalactiae)isolated - MIC*    CLINDAMYCIN <=0.25 SENSITIVE Sensitive     AMPICILLIN <=0.25 SENSITIVE Sensitive     ERYTHROMYCIN <=0.12 SENSITIVE Sensitive     VANCOMYCIN 0.5 SENSITIVE Sensitive     CEFTRIAXONE <=0.12 SENSITIVE Sensitive     LEVOFLOXACIN 1 SENSITIVE Sensitive     * GROUP B  STREP(S.AGALACTIAE)ISOLATED  Blood Culture ID Panel (Reflexed)     Status: Abnormal   Collection Time: 10/29/15  4:00 AM  Result Value Ref Range Status   Enterococcus species NOT DETECTED NOT DETECTED Final   Listeria monocytogenes NOT DETECTED NOT DETECTED Final   Staphylococcus species NOT DETECTED NOT DETECTED Final   Staphylococcus aureus NOT DETECTED NOT DETECTED Final   Streptococcus species DETECTED (A) NOT DETECTED Final    Comment: CRITICAL RESULT CALLED TO, READ BACK BY AND VERIFIED WITH: R RUMBARGER,PHARMD AT 1541 10/29/15 BY L BENFIELD    Streptococcus agalactiae DETECTED (A) NOT DETECTED Final    Comment: CRITICAL RESULT CALLED TO, READ BACK BY AND VERIFIED WITH: R RUMBARGER,PHARMD AT 1541 10/29/15 BY L BENFIELD    Streptococcus pneumoniae NOT DETECTED NOT DETECTED Final   Streptococcus pyogenes NOT DETECTED NOT DETECTED Final   Acinetobacter baumannii NOT DETECTED NOT DETECTED Final   Enterobacteriaceae species NOT DETECTED NOT DETECTED Final   Enterobacter cloacae complex NOT DETECTED NOT DETECTED Final   Escherichia coli NOT DETECTED NOT DETECTED Final   Klebsiella oxytoca NOT DETECTED NOT DETECTED Final   Klebsiella pneumoniae NOT DETECTED NOT DETECTED Final   Proteus species NOT DETECTED NOT DETECTED Final   Serratia marcescens NOT DETECTED NOT DETECTED Final   Haemophilus influenzae NOT DETECTED NOT DETECTED Final   Neisseria meningitidis NOT DETECTED NOT DETECTED Final   Pseudomonas aeruginosa NOT DETECTED NOT DETECTED Final   Candida albicans NOT DETECTED NOT DETECTED Final   Candida glabrata NOT DETECTED NOT DETECTED Final   Candida krusei NOT DETECTED NOT DETECTED Final   Candida parapsilosis NOT DETECTED NOT DETECTED Final   Candida tropicalis NOT DETECTED NOT DETECTED Final  Blood Culture (routine x 2)     Status: Abnormal   Collection Time: 10/29/15  4:15 AM  Result Value Ref Range  Status   Specimen Description BLOOD LEFT ARM  Final   Special Requests  IN PEDIATRIC BOTTLE 2ML  Final   Culture  Setup Time   Final    GRAM POSITIVE COCCI IN CHAINS IN PEDIATRIC BOTTLE CRITICAL RESULT CALLED TO, READ BACK BY AND VERIFIED WITH: R RUMBARGER,PHARMD AT [id number] 10/29/15 BY L BENFIELD    Culture (A)  Final    GROUP B STREP(S.AGALACTIAE)ISOLATED SUSCEPTIBILITIES PERFORMED ON PREVIOUS CULTURE WITHIN THE LAST 5 DAYS.    Report Status 10/31/2015 FINAL  Final  MRSA PCR Screening     Status: None   Collection Time: 10/29/15  6:39 AM  Result Value Ref Range Status   MRSA by PCR NEGATIVE NEGATIVE Final    Comment:        The GeneXpert MRSA Assay (FDA approved for NASAL specimens only), is one component of a comprehensive MRSA colonization surveillance program. It is not intended to diagnose MRSA infection nor to guide or monitor treatment for MRSA infections.   Respiratory Panel by PCR     Status: None   Collection Time: 10/29/15 11:15 AM  Result Value Ref Range Status   Adenovirus NOT DETECTED NOT DETECTED Final   Coronavirus 229E NOT DETECTED NOT DETECTED Final   Coronavirus HKU1 NOT DETECTED NOT DETECTED Final   Coronavirus NL63 NOT DETECTED NOT DETECTED Final   Coronavirus OC43 NOT DETECTED NOT DETECTED Final   Metapneumovirus NOT DETECTED NOT DETECTED Final   Rhinovirus / Enterovirus NOT DETECTED NOT DETECTED Final   Influenza A NOT DETECTED NOT DETECTED Final   Influenza B NOT DETECTED NOT DETECTED Final   Parainfluenza Virus 1 NOT DETECTED NOT DETECTED Final   Parainfluenza Virus 2 NOT DETECTED NOT DETECTED Final   Parainfluenza Virus 3 NOT DETECTED NOT DETECTED Final   Parainfluenza Virus 4 NOT DETECTED NOT DETECTED Final   Respiratory Syncytial Virus NOT DETECTED NOT DETECTED Final   Bordetella pertussis NOT DETECTED NOT DETECTED Final   Chlamydophila pneumoniae NOT DETECTED NOT DETECTED Final   Mycoplasma pneumoniae NOT DETECTED NOT DETECTED Final  Culture, respiratory (NON-Expectorated)     Status: None   Collection Time:  10/30/15 11:22 AM  Result Value Ref Range Status   Specimen Description TRACHEAL ASPIRATE  Final   Special Requests NONE  Final   Gram Stain   Final    ABUNDANT WBC PRESENT,BOTH PMN AND MONONUCLEAR RARE SQUAMOUS EPITHELIAL CELLS PRESENT RARE GRAM POSITIVE COCCI IN PAIRS RARE GRAM POSITIVE RODS    Culture RARE CANDIDA ALBICANS  Final   Report Status 11/01/2015 FINAL  Final  Culture, blood (Routine X 2) w Reflex to ID Panel     Status: None   Collection Time: 10/31/15  9:28 AM  Result Value Ref Range Status   Specimen Description BLOOD RIGHT ARM  Final   Special Requests IN PEDIATRIC BOTTLE 2CC  Final   Culture NO GROWTH 5 DAYS  Final   Report Status 11/05/2015 FINAL  Final  Culture, blood (Routine X 2) w Reflex to ID Panel     Status: None   Collection Time: 10/31/15  9:32 AM  Result Value Ref Range Status   Specimen Description BLOOD RIGHT HAND  Final   Special Requests IN PEDIATRIC BOTTLE 3CC  Final   Culture NO GROWTH 5 DAYS  Final   Report Status 11/05/2015 FINAL  Final  CSF culture     Status: None   Collection Time: 11/02/15  2:41 PM  Result Value Ref Range Status   Specimen  Description CSF  Final   Special Requests NONE  Final   Gram Stain   Final    WBC PRESENT,BOTH PMN AND MONONUCLEAR NO ORGANISMS SEEN CYTOSPIN SMEAR    Culture NO GROWTH 3 DAYS  Final   Report Status 11/05/2015 FINAL  Final  Fungus Culture With Stain     Status: None (Preliminary result)   Collection Time: 11/02/15  2:41 PM  Result Value Ref Range Status   Fungus Stain Final report  Final    Comment: (NOTE) Performed At: [facility] [place], [place] [id number] Lindon Romp MD [id number]    Fungus (Mycology) Culture PENDING  Incomplete   Fungal Source CSF  Final  Fungus Culture Result     Status: None   Collection Time: 11/02/15  2:41 PM  Result Value Ref Range Status   Result 1 Comment  Final    Comment: (NOTE) KOH/Calcofluor preparation:  no fungus  observed. Performed At: [facility] [address] [place], [place] [id number] Lindon Romp MD [id number]       Radiology Studies: No results found.   Scheduled Meds: . ciprofloxacin-dexamethasone  4 drop Right Ear BID  . enoxaparin (LOVENOX) injection  40 mg Subcutaneous Q24H  . mouth rinse  15 mL Mouth Rinse BID  . metoprolol tartrate  25 mg Oral BID  . pencillin G potassium IV  4 Million Units Intravenous Q4H  . rosuvastatin  10 mg Oral Daily   Continuous Infusions:    Marzetta Board, MD, PhD [facility] Pager [phone]  If 7PM-7AM, please contact night-coverage [email or web address] Password [email or web address] 11/05/2015, 1:08 PM

## 2015-11-05 NOTE — Progress Notes (Signed)
Peripherally Inserted Central Catheter/Midline Placement  The IV Nurse has discussed with the patient and/or persons authorized to consent for the patient, the purpose of this procedure and the potential benefits and risks involved with this procedure.  The benefits include less needle sticks, lab draws from the catheter, and the patient may be discharged home with the catheter. Risks include, but not limited to, infection, bleeding, blood clot (thrombus formation), and puncture of an artery; nerve damage and irregular heartbeat and possibility to perform a PICC exchange if needed/ordered by physician.  Alternatives to this procedure were also discussed.  Bard Power PICC patient education guide, fact sheet on infection prevention and patient information card has been provided to patient /or left at bedside.    PICC/Midline Placement Documentation        [name] 11/05/2015, 8:29 AM

## 2015-11-06 LAB — CBC
HEMATOCRIT: 40.6 % (ref 39.0–52.0)
Hemoglobin: 13.9 g/dL (ref 13.0–17.0)
MCH: 29.4 pg (ref 26.0–34.0)
MCHC: 34.2 g/dL (ref 30.0–36.0)
MCV: 86 fL (ref 78.0–100.0)
PLATELETS: 431 10*3/uL — AB (ref 150–400)
RBC: 4.72 MIL/uL (ref 4.22–5.81)
RDW: 12.5 % (ref 11.5–15.5)
WBC: 14.2 10*3/uL — AB (ref 4.0–10.5)

## 2015-11-06 MED ORDER — BENZONATATE 100 MG PO CAPS
100.0000 mg | ORAL_CAPSULE | Freq: Three times a day (TID) | ORAL | Status: DC
  Administered 2015-11-06 – 2015-11-08 (×9): 100 mg via ORAL
  Filled 2015-11-06 (×9): qty 1

## 2015-11-06 MED ORDER — HYDROCOD POLST-CPM POLST ER 10-8 MG/5ML PO SUER: 5.0000 mL | Freq: Two times a day (BID) | ORAL | Status: DC

## 2015-11-06 MED ORDER — FINASTERIDE 5 MG PO TABS
5.0000 mg | ORAL_TABLET | Freq: Every day | ORAL | Status: DC
  Administered 2015-11-06 – 2015-11-08 (×3): 5 mg via ORAL
  Filled 2015-11-06 (×3): qty 1

## 2015-11-06 MED ORDER — GUAIFENESIN-DM 100-10 MG/5ML PO SYRP: 5.0000 mL | ORAL_SOLUTION | ORAL | Status: DC | PRN

## 2015-11-06 NOTE — Progress Notes (Signed)
PROGRESS NOTE  [name] [id number] DOB: 06-08-1955 DOA: 10/29/2015 PCP: Henrine Screws, MD   LOS: 8 days   Brief Narrative: 60 yo male presented with altered mental status, incontinent of bowel/bladder, fever, and respiratory failure.  He had cystoscopy and transrectal u/s for BPH 2 days prior to admission and given cipro for prophylaxis.  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  Noted to have Rt ear infection.  Found to have Group B Strep bacteremia and meningitis.  SIGNIFICANT EVENTS: 10/28 intubated in the ED for agitation 10/29 cardiology consulted for TEE, ID consulted for bacteremia 11/01 urology consulted, LP by IR  Assessment & Plan: Active Problems:   Sepsis ([facility])   Delirium   Respiratory failure (HCC)   Acute encephalopathy   Elevated troponin   MVP (mitral valve prolapse)   Mitral valve insufficiency   Bacteremia   GBS bacteremia - ID consulted, appreciate Dr. Henreitta Leber input - TEE negative for bacteremia  - on penicillin, continue. PICC line placed 11/4 - Patient will need 2 weeks of IV penicillin based on negative blood cultures on 10/30, plan to continue through 11/13/2015 - surveillance cultures remained negative, final  Acute encephalopathy 2nd to meningitis >> much improved. - monitor mental status, now AxOx4 - hold outpt bupropion for now - CIR to evaluate patient for inpatient rehabilitation, awaiting evaluation  Rt Otitis Externa - Continue Ciprodex through 11/7  Meningitis - Abx per ID, as above  Right renal mass - CT scan abdomen and pelvis on 10/31 showed right kidney mass up to 53 mm, suspicious for renal malignancy. Urology is following, appreciate input  Prior CVAs - No new neurologic findings, patient recovered well after these CVAs in the past  Hx of BPH - f/u with urology  Elevated blood pressure, sinus tachycardia - Now on metoprolol, improved  Hx of HLD - continue outpt  crestor  Hx of MVP - added lopressor 11/03  Nutrition  Dysphagia - D1 diet - f/u with speech therapy  Deconditioning - PT, OT   DVT prophylaxis: Lovenox Code Status: Full Family Communication: no family bedside Disposition Plan: CIR to evaluate  Consultants:   ID  Urology  PCCM  CULTURES: Blood 10/28 >> Group B Strep Respiratory viral panel 10/28 >> negative Urine 10/28 >> negative Sputum 10/29 >> negative Blood 10/30 >> CSF 11/01 >>  CSF HSV 11/01 >> negative  ANTIBIOTICS: Vancomycin 10/28 >> 10/30 Zosyn 10/28 >> 10/30 Pencillin 10/30 >> Cipro ear drops 11/02 >>   LINES/TUBES: ETT 10/28 >> 10/31  Subjective: - no chest pain, shortness of breath, no abdominal pain, nausea or vomiting. Slept better overnight   Objective: Vitals:   11/05/15 2109 11/06/15 0046 11/06/15 0403 11/06/15 0814  BP: (!) 165/76 138/73 (!) 148/71 (!) 152/73  Pulse: 80 75 86 93  Resp: 18  18   Temp: (!) 87.6 F (30.9 C) 97.7 F (36.5 C) 98.9 F (37.2 C)   TempSrc: Oral Oral Oral   SpO2: 98%  100%   Weight:   110.6 kg (243 lb 13.3 oz)   Height:        Intake/Output Summary (Last 24 hours) at 11/06/15 1310 Last data filed at 11/06/15 0806  Gross per 24 hour  Intake              240 ml  Output              650 ml  Net             -  410 ml   Filed Weights   11/04/15 0341 11/05/15 0300 11/06/15 0403  Weight: 91.2 kg (201 lb) 109 kg (240 lb 4.8 oz) 110.6 kg (243 lb 13.3 oz)    Examination: Constitutional: NAD Vitals:   11/05/15 2109 11/06/15 0046 11/06/15 0403 11/06/15 0814  BP: (!) 165/76 138/73 (!) 148/71 (!) 152/73  Pulse: 80 75 86 93  Resp: 18  18   Temp: (!) 87.6 F (30.9 C) 97.7 F (36.5 C) 98.9 F (37.2 C)   TempSrc: Oral Oral Oral   SpO2: 98%  100%   Weight:   110.6 kg (243 lb 13.3 oz)   Height:       Eyes: PERRL, lids and conjunctivae normal Respiratory: clear to auscultation bilaterally, no wheezing, no crackles.  Cardiovascular: Regular rate and  rhythm, no murmurs / rubs / gallops. No LE edema. 2+ pedal pulses. No carotid bruits.  Abdomen: no tenderness. Bowel sounds positive.  Musculoskeletal: no clubbing / cyanosis.  Skin: no rashes, lesions, ulcers. No induration Neurologic: non focal. AxOx4   Data Reviewed: I have personally reviewed following labs and imaging studies  CBC:  Recent Labs Lab 10/31/15 0111 11/01/15 0146 11/02/15 0524 11/03/15 0247 11/06/15 0500  WBC 16.4* 13.4* 11.9* 12.3* 14.2*  HGB 12.5* 12.6* 12.6* 13.9 13.9  HCT 38.9* 39.2 36.8* 40.2 40.6  MCV 91.3 92.5 87.2 85.4 86.0  PLT 180 184 223 257 [id number]*   Basic Metabolic Panel:  Recent Labs Lab 10/30/15 1712  10/31/15 0111 11/01/15 0146 11/02/15 0524 11/03/15 0247 11/04/15 0812 11/05/15 0235  NA  --   < > 142 142 138 135 135 131*  K  --   < > 3.5 4.0 3.9 3.4* 3.3* 4.0  CL  --   < > 110 108 106 103 102 102  CO2  --   < > 25 26 23  21* 23 19*  GLUCOSE  --   < > 123* 123* 137* 96 122* 120*  BUN  --   < > 25* 23* 18 17 13 16   CREATININE  --   < > 1.07 1.02 0.77 0.83 0.90 0.90  CALCIUM  --   < > 8.4* 8.7* 8.4* 8.5* 8.9 8.8*  MG 2.3  --  2.3 2.1 1.9 2.0  --   --   PHOS 2.9  --  2.7 3.3 2.5 2.6  --   --   < > = values in this interval not displayed. GFR: Estimated Creatinine Clearance: 106.6 mL/min (by C-G formula based on SCr of 0.9 mg/dL). Liver Function Tests: No results for input(s): AST, ALT, ALKPHOS, BILITOT, PROT, ALBUMIN in the last 168 hours. No results for input(s): LIPASE, AMYLASE in the last 168 hours. No results for input(s): AMMONIA in the last 168 hours. Coagulation Profile: No results for input(s): INR, PROTIME in the last 168 hours. Cardiac Enzymes: No results for input(s): CKTOTAL, CKMB, CKMBINDEX, TROPONINI in the last 168 hours. BNP (last 3 results) No results for input(s): PROBNP in the last 8760 hours. HbA1C: No results for input(s): HGBA1C in the last 72 hours. CBG:  Recent Labs Lab 11/02/15 0816 11/02/15 1109  11/02/15 1606 11/02/15 2003 11/03/15 0008  GLUCAP 87 92 114* 92 105*   Lipid Profile: No results for input(s): CHOL, HDL, LDLCALC, TRIG, CHOLHDL, LDLDIRECT in the last 72 hours. Thyroid Function Tests: No results for input(s): TSH, T4TOTAL, FREET4, T3FREE, THYROIDAB in the last 72 hours. Anemia Panel: No results for input(s): VITAMINB12, FOLATE, FERRITIN, TIBC, IRON, RETICCTPCT in  the last 72 hours. Urine analysis:    Component Value Date/Time   COLORURINE YELLOW 10/29/2015 [place] 10/29/2015 0355   LABSPEC 1.015 10/29/2015 0355   PHURINE 7.5 10/29/2015 0355   GLUCOSEU NEGATIVE 10/29/2015 0355   HGBUR TRACE (A) 10/29/2015 [place] 10/29/2015 0355   KETONESUR NEGATIVE 10/29/2015 0355   PROTEINUR NEGATIVE 10/29/2015 0355   UROBILINOGEN 0.2 03/15/2008 1023   NITRITE NEGATIVE 10/29/2015 0355   LEUKOCYTESUR NEGATIVE 10/29/2015 0355   Sepsis Labs: Invalid input(s): PROCALCITONIN, LACTICIDVEN  Recent Results (from the past 240 hour(s))  Urine culture     Status: None   Collection Time: 10/29/15  3:55 AM  Result Value Ref Range Status   Specimen Description URINE, RANDOM  Final   Special Requests NONE  Final   Culture NO GROWTH  Final   Report Status 10/30/2015 FINAL  Final  Blood Culture (routine x 2)     Status: Abnormal   Collection Time: 10/29/15  4:00 AM  Result Value Ref Range Status   Specimen Description BLOOD LEFT ARM  Final   Special Requests BOTTLES DRAWN AEROBIC AND ANAEROBIC 5ML  Final   Culture  Setup Time   Final    GRAM POSITIVE COCCI IN CHAINS IN BOTH AEROBIC AND ANAEROBIC BOTTLES CRITICAL RESULT CALLED TO, READ BACK BY AND VERIFIED WITH: R RUMBARGER,PHARMD AT [id number] 10/29/15 BY L BENFIELD    Culture GROUP B STREP(S.AGALACTIAE)ISOLATED (A)  Final   Report Status 10/31/2015 FINAL  Final   Organism ID, Bacteria GROUP B STREP(S.AGALACTIAE)ISOLATED  Final      Susceptibility   Group b strep(s.agalactiae)isolated - MIC*     CLINDAMYCIN <=0.25 SENSITIVE Sensitive     AMPICILLIN <=0.25 SENSITIVE Sensitive     ERYTHROMYCIN <=0.12 SENSITIVE Sensitive     VANCOMYCIN 0.5 SENSITIVE Sensitive     CEFTRIAXONE <=0.12 SENSITIVE Sensitive     LEVOFLOXACIN 1 SENSITIVE Sensitive     * GROUP B STREP(S.AGALACTIAE)ISOLATED  Blood Culture ID Panel (Reflexed)     Status: Abnormal   Collection Time: 10/29/15  4:00 AM  Result Value Ref Range Status   Enterococcus species NOT DETECTED NOT DETECTED Final   Listeria monocytogenes NOT DETECTED NOT DETECTED Final   Staphylococcus species NOT DETECTED NOT DETECTED Final   Staphylococcus aureus NOT DETECTED NOT DETECTED Final   Streptococcus species DETECTED (A) NOT DETECTED Final    Comment: CRITICAL RESULT CALLED TO, READ BACK BY AND VERIFIED WITH: R RUMBARGER,PHARMD AT 1541 10/29/15 BY L BENFIELD    Streptococcus agalactiae DETECTED (A) NOT DETECTED Final    Comment: CRITICAL RESULT CALLED TO, READ BACK BY AND VERIFIED WITH: R RUMBARGER,PHARMD AT 1541 10/29/15 BY L BENFIELD    Streptococcus pneumoniae NOT DETECTED NOT DETECTED Final   Streptococcus pyogenes NOT DETECTED NOT DETECTED Final   Acinetobacter baumannii NOT DETECTED NOT DETECTED Final   Enterobacteriaceae species NOT DETECTED NOT DETECTED Final   Enterobacter cloacae complex NOT DETECTED NOT DETECTED Final   Escherichia coli NOT DETECTED NOT DETECTED Final   Klebsiella oxytoca NOT DETECTED NOT DETECTED Final   Klebsiella pneumoniae NOT DETECTED NOT DETECTED Final   Proteus species NOT DETECTED NOT DETECTED Final   Serratia marcescens NOT DETECTED NOT DETECTED Final   Haemophilus influenzae NOT DETECTED NOT DETECTED Final   Neisseria meningitidis NOT DETECTED NOT DETECTED Final   Pseudomonas aeruginosa NOT DETECTED NOT DETECTED Final   Candida albicans NOT DETECTED NOT DETECTED Final   Candida glabrata NOT DETECTED NOT DETECTED  Final   Candida krusei NOT DETECTED NOT DETECTED Final   Candida parapsilosis NOT  DETECTED NOT DETECTED Final   Candida tropicalis NOT DETECTED NOT DETECTED Final  Blood Culture (routine x 2)     Status: Abnormal   Collection Time: 10/29/15  4:15 AM  Result Value Ref Range Status   Specimen Description BLOOD LEFT ARM  Final   Special Requests IN PEDIATRIC BOTTLE 2ML  Final   Culture  Setup Time   Final    GRAM POSITIVE COCCI IN CHAINS IN PEDIATRIC BOTTLE CRITICAL RESULT CALLED TO, READ BACK BY AND VERIFIED WITH: R RUMBARGER,PHARMD AT [id number] 10/29/15 BY L BENFIELD    Culture (A)  Final    GROUP B STREP(S.AGALACTIAE)ISOLATED SUSCEPTIBILITIES PERFORMED ON PREVIOUS CULTURE WITHIN THE LAST 5 DAYS.    Report Status 10/31/2015 FINAL  Final  MRSA PCR Screening     Status: None   Collection Time: 10/29/15  6:39 AM  Result Value Ref Range Status   MRSA by PCR NEGATIVE NEGATIVE Final    Comment:        The GeneXpert MRSA Assay (FDA approved for NASAL specimens only), is one component of a comprehensive MRSA colonization surveillance program. It is not intended to diagnose MRSA infection nor to guide or monitor treatment for MRSA infections.   Respiratory Panel by PCR     Status: None   Collection Time: 10/29/15 11:15 AM  Result Value Ref Range Status   Adenovirus NOT DETECTED NOT DETECTED Final   Coronavirus 229E NOT DETECTED NOT DETECTED Final   Coronavirus HKU1 NOT DETECTED NOT DETECTED Final   Coronavirus NL63 NOT DETECTED NOT DETECTED Final   Coronavirus OC43 NOT DETECTED NOT DETECTED Final   Metapneumovirus NOT DETECTED NOT DETECTED Final   Rhinovirus / Enterovirus NOT DETECTED NOT DETECTED Final   Influenza A NOT DETECTED NOT DETECTED Final   Influenza B NOT DETECTED NOT DETECTED Final   Parainfluenza Virus 1 NOT DETECTED NOT DETECTED Final   Parainfluenza Virus 2 NOT DETECTED NOT DETECTED Final   Parainfluenza Virus 3 NOT DETECTED NOT DETECTED Final   Parainfluenza Virus 4 NOT DETECTED NOT DETECTED Final   Respiratory Syncytial Virus NOT DETECTED NOT  DETECTED Final   Bordetella pertussis NOT DETECTED NOT DETECTED Final   Chlamydophila pneumoniae NOT DETECTED NOT DETECTED Final   Mycoplasma pneumoniae NOT DETECTED NOT DETECTED Final  Culture, respiratory (NON-Expectorated)     Status: None   Collection Time: 10/30/15 11:22 AM  Result Value Ref Range Status   Specimen Description TRACHEAL ASPIRATE  Final   Special Requests NONE  Final   Gram Stain   Final    ABUNDANT WBC PRESENT,BOTH PMN AND MONONUCLEAR RARE SQUAMOUS EPITHELIAL CELLS PRESENT RARE GRAM POSITIVE COCCI IN PAIRS RARE GRAM POSITIVE RODS    Culture RARE CANDIDA ALBICANS  Final   Report Status 11/01/2015 FINAL  Final  Culture, blood (Routine X 2) w Reflex to ID Panel     Status: None   Collection Time: 10/31/15  9:28 AM  Result Value Ref Range Status   Specimen Description BLOOD RIGHT ARM  Final   Special Requests IN PEDIATRIC BOTTLE 2CC  Final   Culture NO GROWTH 5 DAYS  Final   Report Status 11/05/2015 FINAL  Final  Culture, blood (Routine X 2) w Reflex to ID Panel     Status: None   Collection Time: 10/31/15  9:32 AM  Result Value Ref Range Status   Specimen Description BLOOD RIGHT HAND  Final  Special Requests IN PEDIATRIC BOTTLE 3CC  Final   Culture NO GROWTH 5 DAYS  Final   Report Status 11/05/2015 FINAL  Final  CSF culture     Status: None   Collection Time: 11/02/15  2:41 PM  Result Value Ref Range Status   Specimen Description CSF  Final   Special Requests NONE  Final   Gram Stain   Final    WBC PRESENT,BOTH PMN AND MONONUCLEAR NO ORGANISMS SEEN CYTOSPIN SMEAR    Culture NO GROWTH 3 DAYS  Final   Report Status 11/05/2015 FINAL  Final  Fungus Culture With Stain     Status: None (Preliminary result)   Collection Time: 11/02/15  2:41 PM  Result Value Ref Range Status   Fungus Stain Final report  Final    Comment: (NOTE) Performed At: [facility] [place], [place] [id number] Lindon Romp MD [id number]    Fungus  (Mycology) Culture PENDING  Incomplete   Fungal Source CSF  Final  Fungus Culture Result     Status: None   Collection Time: 11/02/15  2:41 PM  Result Value Ref Range Status   Result 1 Comment  Final    Comment: (NOTE) KOH/Calcofluor preparation:  no fungus observed. Performed At: [facility] [address] [place], [place] [id number] Lindon Romp MD [id number]       Radiology Studies: No results found.   Scheduled Meds: . benzonatate  100 mg Oral TID  . ciprofloxacin-dexamethasone  4 drop Right Ear BID  . enoxaparin (LOVENOX) injection  40 mg Subcutaneous Q24H  . mouth rinse  15 mL Mouth Rinse BID  . metoprolol tartrate  25 mg Oral BID  . pencillin G potassium IV  4 Million Units Intravenous Q4H  . rosuvastatin  10 mg Oral Daily   Continuous Infusions:   Marzetta Board, MD, PhD [facility] Pager [phone]  If 7PM-7AM, please contact night-coverage [email or web address] Password [email or web address] 11/06/2015, 1:10 PM

## 2015-11-06 NOTE — Progress Notes (Signed)
Assessment:  1. Solid Right renal mass. Discussed today with pt. Voiced understanding. He will go through full rehab before consideration of laparascopic robotic partial nephrectomy.                            2. BPH, symptomatic. He has been on Rapaflo. Intolerant of tamsulosin. I am concerned with restarting ra[paflo b/c of potential for dizziness/h/a. Would use finasteride, 5mg /day instead.   Plan: 1. finasteride 5mg , 1 po/day.            2.  F/u for BPH after he finishes his PT           3. F/u with Dr. Phebe Colla for surgery discussion of Right renal mass after Holidays.     Subjective: Patient reports feeling much better. Voiding adequately, but with BPH symptoms.   Objective: Vital signs in last 24 hours: Temp:  [87.6 F (30.9 C)-98.9 F (37.2 C)] 98.3 F (36.8 C) (11/05 1321) Pulse Rate:  [75-93] 76 (11/05 1321) Resp:  [18] 18 (11/05 1321) BP: (129-165)/(64-76) 129/64 (11/05 1321) SpO2:  [95 %-100 %] 95 % (11/05 1321) Weight:  [110.6 kg (243 lb 13.3 oz)] 110.6 kg (243 lb 13.3 oz) (11/05 0403)A  Intake/Output from previous day: 11/04 0701 - 11/05 0700 In: 360 [P.O.:360] Out: 650 [Urine:650] Intake/Output this shift: Total I/O In: 120 [P.O.:120] Out: 300 [Urine:300]  Past Medical History:  Diagnosis Date  . CVA (cerebral infarction)    hemmorhagic  CVA  . Dyslipidemia   . Endocarditis    [facility] [place]  . Mitral regurgitation   . Myocardial infarction   . Stroke [facility])     Physical Exam:  Lungs - Normal respiratory effort, chest expands symmetrically.  Abdomen - Soft, non-tender & non-distended.  Lab Results:  Recent Labs  11/06/15 0500  WBC 14.2*  HGB 13.9  HCT 40.6   BMET  Recent Labs  11/04/15 0812 11/05/15 0235  NA 135 131*  K 3.3* 4.0  CL 102 102  CO2 23 19*  GLUCOSE 122* 120*  BUN 13 16  CREATININE 0.90 0.90  CALCIUM 8.9 8.8*   No results for input(s): LABURIN in the last 72 hours. Results for orders placed or performed during the  hospital encounter of 10/29/15  Urine culture     Status: None   Collection Time: 10/29/15  3:55 AM  Result Value Ref Range Status   Specimen Description URINE, RANDOM  Final   Special Requests NONE  Final   Culture NO GROWTH  Final   Report Status 10/30/2015 FINAL  Final  Blood Culture (routine x 2)     Status: Abnormal   Collection Time: 10/29/15  4:00 AM  Result Value Ref Range Status   Specimen Description BLOOD LEFT ARM  Final   Special Requests BOTTLES DRAWN AEROBIC AND ANAEROBIC 5ML  Final   Culture  Setup Time   Final    GRAM POSITIVE COCCI IN CHAINS IN BOTH AEROBIC AND ANAEROBIC BOTTLES CRITICAL RESULT CALLED TO, READ BACK BY AND VERIFIED WITH: R RUMBARGER,PHARMD AT [id number] 10/29/15 BY L BENFIELD    Culture GROUP B STREP(S.AGALACTIAE)ISOLATED (A)  Final   Report Status 10/31/2015 FINAL  Final   Organism ID, Bacteria GROUP B STREP(S.AGALACTIAE)ISOLATED  Final      Susceptibility   Group b strep(s.agalactiae)isolated - MIC*    CLINDAMYCIN <=0.25 SENSITIVE Sensitive     AMPICILLIN <=0.25 SENSITIVE Sensitive     ERYTHROMYCIN <=0.12 SENSITIVE  Sensitive     VANCOMYCIN 0.5 SENSITIVE Sensitive     CEFTRIAXONE <=0.12 SENSITIVE Sensitive     LEVOFLOXACIN 1 SENSITIVE Sensitive     * GROUP B STREP(S.AGALACTIAE)ISOLATED  Blood Culture ID Panel (Reflexed)     Status: Abnormal   Collection Time: 10/29/15  4:00 AM  Result Value Ref Range Status   Enterococcus species NOT DETECTED NOT DETECTED Final   Listeria monocytogenes NOT DETECTED NOT DETECTED Final   Staphylococcus species NOT DETECTED NOT DETECTED Final   Staphylococcus aureus NOT DETECTED NOT DETECTED Final   Streptococcus species DETECTED (A) NOT DETECTED Final    Comment: CRITICAL RESULT CALLED TO, READ BACK BY AND VERIFIED WITH: R RUMBARGER,PHARMD AT 1541 10/29/15 BY L BENFIELD    Streptococcus agalactiae DETECTED (A) NOT DETECTED Final    Comment: CRITICAL RESULT CALLED TO, READ BACK BY AND VERIFIED WITH: R  RUMBARGER,PHARMD AT 1541 10/29/15 BY L BENFIELD    Streptococcus pneumoniae NOT DETECTED NOT DETECTED Final   Streptococcus pyogenes NOT DETECTED NOT DETECTED Final   Acinetobacter baumannii NOT DETECTED NOT DETECTED Final   Enterobacteriaceae species NOT DETECTED NOT DETECTED Final   Enterobacter cloacae complex NOT DETECTED NOT DETECTED Final   Escherichia coli NOT DETECTED NOT DETECTED Final   Klebsiella oxytoca NOT DETECTED NOT DETECTED Final   Klebsiella pneumoniae NOT DETECTED NOT DETECTED Final   Proteus species NOT DETECTED NOT DETECTED Final   Serratia marcescens NOT DETECTED NOT DETECTED Final   Haemophilus influenzae NOT DETECTED NOT DETECTED Final   Neisseria meningitidis NOT DETECTED NOT DETECTED Final   Pseudomonas aeruginosa NOT DETECTED NOT DETECTED Final   Candida albicans NOT DETECTED NOT DETECTED Final   Candida glabrata NOT DETECTED NOT DETECTED Final   Candida krusei NOT DETECTED NOT DETECTED Final   Candida parapsilosis NOT DETECTED NOT DETECTED Final   Candida tropicalis NOT DETECTED NOT DETECTED Final  Blood Culture (routine x 2)     Status: Abnormal   Collection Time: 10/29/15  4:15 AM  Result Value Ref Range Status   Specimen Description BLOOD LEFT ARM  Final   Special Requests IN PEDIATRIC BOTTLE 2ML  Final   Culture  Setup Time   Final    GRAM POSITIVE COCCI IN CHAINS IN PEDIATRIC BOTTLE CRITICAL RESULT CALLED TO, READ BACK BY AND VERIFIED WITH: R RUMBARGER,PHARMD AT 1541 10/29/15 BY L BENFIELD    Culture (A)  Final    GROUP B STREP(S.AGALACTIAE)ISOLATED SUSCEPTIBILITIES PERFORMED ON PREVIOUS CULTURE WITHIN THE LAST 5 DAYS.    Report Status 10/31/2015 FINAL  Final  MRSA PCR Screening     Status: None   Collection Time: 10/29/15  6:39 AM  Result Value Ref Range Status   MRSA by PCR NEGATIVE NEGATIVE Final    Comment:        The GeneXpert MRSA Assay (FDA approved for NASAL specimens only), is one component of a comprehensive MRSA  colonization surveillance program. It is not intended to diagnose MRSA infection nor to guide or monitor treatment for MRSA infections.   Respiratory Panel by PCR     Status: None   Collection Time: 10/29/15 11:15 AM  Result Value Ref Range Status   Adenovirus NOT DETECTED NOT DETECTED Final   Coronavirus 229E NOT DETECTED NOT DETECTED Final   Coronavirus HKU1 NOT DETECTED NOT DETECTED Final   Coronavirus NL63 NOT DETECTED NOT DETECTED Final   Coronavirus OC43 NOT DETECTED NOT DETECTED Final   Metapneumovirus NOT DETECTED NOT DETECTED Final   Rhinovirus /  Enterovirus NOT DETECTED NOT DETECTED Final   Influenza A NOT DETECTED NOT DETECTED Final   Influenza B NOT DETECTED NOT DETECTED Final   Parainfluenza Virus 1 NOT DETECTED NOT DETECTED Final   Parainfluenza Virus 2 NOT DETECTED NOT DETECTED Final   Parainfluenza Virus 3 NOT DETECTED NOT DETECTED Final   Parainfluenza Virus 4 NOT DETECTED NOT DETECTED Final   Respiratory Syncytial Virus NOT DETECTED NOT DETECTED Final   Bordetella pertussis NOT DETECTED NOT DETECTED Final   Chlamydophila pneumoniae NOT DETECTED NOT DETECTED Final   Mycoplasma pneumoniae NOT DETECTED NOT DETECTED Final  Culture, respiratory (NON-Expectorated)     Status: None   Collection Time: 10/30/15 11:22 AM  Result Value Ref Range Status   Specimen Description TRACHEAL ASPIRATE  Final   Special Requests NONE  Final   Gram Stain   Final    ABUNDANT WBC PRESENT,BOTH PMN AND MONONUCLEAR RARE SQUAMOUS EPITHELIAL CELLS PRESENT RARE GRAM POSITIVE COCCI IN PAIRS RARE GRAM POSITIVE RODS    Culture RARE CANDIDA ALBICANS  Final   Report Status 11/01/2015 FINAL  Final  Culture, blood (Routine X 2) w Reflex to ID Panel     Status: None   Collection Time: 10/31/15  9:28 AM  Result Value Ref Range Status   Specimen Description BLOOD RIGHT ARM  Final   Special Requests IN PEDIATRIC BOTTLE 2CC  Final   Culture NO GROWTH 5 DAYS  Final   Report Status 11/05/2015  FINAL  Final  Culture, blood (Routine X 2) w Reflex to ID Panel     Status: None   Collection Time: 10/31/15  9:32 AM  Result Value Ref Range Status   Specimen Description BLOOD RIGHT HAND  Final   Special Requests IN PEDIATRIC BOTTLE 3CC  Final   Culture NO GROWTH 5 DAYS  Final   Report Status 11/05/2015 FINAL  Final  CSF culture     Status: None   Collection Time: 11/02/15  2:41 PM  Result Value Ref Range Status   Specimen Description CSF  Final   Special Requests NONE  Final   Gram Stain   Final    WBC PRESENT,BOTH PMN AND MONONUCLEAR NO ORGANISMS SEEN CYTOSPIN SMEAR    Culture NO GROWTH 3 DAYS  Final   Report Status 11/05/2015 FINAL  Final  Fungus Culture With Stain     Status: None (Preliminary result)   Collection Time: 11/02/15  2:41 PM  Result Value Ref Range Status   Fungus Stain Final report  Final    Comment: (NOTE) Performed At: [facility] [place], [place] [id number] Lindon Romp MD [id number]    Fungus (Mycology) Culture PENDING  Incomplete   Fungal Source CSF  Final  Fungus Culture Result     Status: None   Collection Time: 11/02/15  2:41 PM  Result Value Ref Range Status   Result 1 Comment  Final    Comment: (NOTE) KOH/Calcofluor preparation:  no fungus observed. Performed At: [facility] [place], [place] [id number] Lindon Romp MD [id number]     Studies/Results: No results found.    Mariaelena Cade I Crystalynn Mcinerney 11/06/2015, 2:35 PM

## 2015-11-07 DIAGNOSIS — C649 Malignant neoplasm of unspecified kidney, except renal pelvis: Secondary | ICD-10-CM

## 2015-11-07 DIAGNOSIS — I341 Nonrheumatic mitral (valve) prolapse: Secondary | ICD-10-CM

## 2015-11-07 DIAGNOSIS — F1010 Alcohol abuse, uncomplicated: Secondary | ICD-10-CM

## 2015-11-07 DIAGNOSIS — G039 Meningitis, unspecified: Secondary | ICD-10-CM

## 2015-11-07 DIAGNOSIS — Z8679 Personal history of other diseases of the circulatory system: Secondary | ICD-10-CM

## 2015-11-07 DIAGNOSIS — E876 Hypokalemia: Secondary | ICD-10-CM

## 2015-11-07 DIAGNOSIS — D72829 Elevated white blood cell count, unspecified: Secondary | ICD-10-CM

## 2015-11-07 DIAGNOSIS — I6930 Unspecified sequelae of cerebral infarction: Secondary | ICD-10-CM

## 2015-11-07 DIAGNOSIS — C659 Malignant neoplasm of unspecified renal pelvis: Secondary | ICD-10-CM

## 2015-11-07 DIAGNOSIS — R1310 Dysphagia, unspecified: Secondary | ICD-10-CM

## 2015-11-07 DIAGNOSIS — E871 Hypo-osmolality and hyponatremia: Secondary | ICD-10-CM

## 2015-11-07 NOTE — Progress Notes (Signed)
Physical Therapy Treatment Patient Details Name: [name] MRN: [id number] DOB: [birth year] Today's Date: 11/07/2015    History of Present Illness 60 yo male presented with altered mental status, incontinent of bowel/bladder, fever, and respiratory failure. He had cystoscopy and transrectal u/s for BPH 2 days prior to admission and given cipro for prophylaxis. 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 Noted to have Rt ear infection. Found to have Group B Strep bacteremia and meningitis    PT Comments    Pt with improved gait and function who is able to increase distance and endurance however requires assist for direction and control of RW. Pt with decreased cognition and hearing compared to baseline. Pt educated for HEP, gait and function with encouragement to continue HEP and mobility with assist throughout the day. Pt typically walks dog 4-10 miles/day and was very active and independent PTA. Pt able to recall PT name from post CVA but unable to recall room number currently. Continue to recommend CIR.   Follow Up Recommendations  CIR;Supervision/Assistance - 24 hour     Equipment Recommendations       Recommendations for Other Services       Precautions / Restrictions Precautions Precautions: Fall Precaution Comments: incontinent, HOH    Mobility  Bed Mobility               General bed mobility comments: EOB on arrival with nursing  Transfers Overall transfer level: Needs assistance   Transfers: Sit to/from Stand Sit to Stand: Min guard         General transfer comment: guarding for safety and lines with tactile cueing for safety due to [facility]  Ambulation/Gait Ambulation/Gait assistance: Min assist Ambulation Distance (Feet): 350 Feet Assistive device: Rolling walker (2 wheeled) Gait Pattern/deviations: Step-through pattern;Decreased stride length   Gait velocity interpretation: Below normal speed for age/gender General Gait  Details: pt with variation of too far anterior/too posterior to RW with cues throughout for RW use, safety and obstacles, pt running into obstacles x 4 during gait despite cues. Pt denied further distance due to fatigue   Stairs            Wheelchair Mobility    Modified Rankin (Stroke Patients Only)       Balance Overall balance assessment: Needs assistance   Sitting balance-Leahy Scale: Good       Standing balance-Leahy Scale: Fair                      Cognition Arousal/Alertness: Awake/alert Behavior During Therapy: Impulsive Overall Cognitive Status: Impaired/Different from baseline Area of Impairment: Following commands;Safety/judgement;Problem solving   Current Attention Level: Selective Memory: Decreased short-term memory Following Commands: Follows one step commands consistently Safety/Judgement: Decreased awareness of safety;Decreased awareness of deficits   Problem Solving: Difficulty sequencing;Requires verbal cues General Comments: pt unable to recall or find room number, unaware of condom cath, running into obstacles despite cues    Exercises General Exercises - Lower Extremity Long Arc Quad: AROM;15 reps;Both;Seated Hip ABduction/ADduction: AROM;15 reps;Both;Seated Hip Flexion/Marching: AROM;15 reps;Both;Seated Toe Raises: AROM;15 reps;Both;Seated Heel Raises: AROM;15 reps;Both;Seated    General Comments        Pertinent Vitals/Pain Pain Assessment: No/denies pain    Home Living                      Prior Function            PT Goals (current goals can now be  found in the care plan section) Progress towards PT goals: Progressing toward goals    Frequency           PT Plan Current plan remains appropriate    Co-evaluation             End of Session Equipment Utilized During Treatment: Gait belt Activity Tolerance: Patient tolerated treatment well Patient left: in chair;with call bell/phone within  reach;with family/visitor present;with chair alarm set     Time: [id number] PT Time Calculation (min) (ACUTE ONLY): 28 min  Charges:  $Gait Training: 8-22 mins $Therapeutic Exercise: 8-22 mins                    G Codes:      Melford Aase 12/06/15, 12:10 PM  Elwyn Reach, [place]

## 2015-11-07 NOTE — Progress Notes (Signed)
Occupational Therapy Treatment Patient Details Name: [name] MRN: [id number] DOB: [birth year] Today's Date: 11/07/2015    History of present illness 60 yo male presented with altered mental status, incontinent of bowel/bladder, fever, and respiratory failure. He had cystoscopy and transrectal u/s for BPH 2 days prior to admission and given cipro for prophylaxis. 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 Noted to have Rt ear infection. Found to have Group B Strep bacteremia and meningitis   OT comments  Pt demonstrates improving independence with ADLs.  He requires min A overall for ADLs and functional mobility, until he runs into an obstacle, and then, at times, requires mod A to recover balance and prevent fall  He requires min A to problem solve through familiar tasks in novel environment.  He is at very high risk for fall, injury, and readmission - recommend CIR to allow him to maximize safety and independence with ADLs and functional mobility   Follow Up Recommendations  CIR;Supervision/Assistance - 24 hour    Equipment Recommendations  3 in 1 bedside comode;Tub/shower seat    Recommendations for Other Services      Precautions / Restrictions Precautions Precautions: Fall Precaution Comments: incontinent, HOH       Mobility Bed Mobility Overal bed mobility: Needs Assistance Bed Mobility: Supine to Sit;Sit to Supine     Supine to sit: Min guard Sit to supine: Min guard   General bed mobility comments: EOB on arrival with nursing  Transfers Overall transfer level: Needs assistance Equipment used: Rolling walker (2 wheeled) Transfers: Sit to/from [facility] Sit to Stand: Min guard Stand pivot transfers: Min assist       General transfer comment: min guard for safety and min a for balance     Balance Overall balance assessment: Needs assistance Sitting-balance support: Feet supported Sitting balance-Leahy  Scale: Good     Standing balance support: Single extremity supported;During functional activity Standing balance-Leahy Scale: Fair                     ADL Overall ADL's : Needs assistance/impaired Eating/Feeding: Supervision/ safety;Set up   Grooming: Wash/dry hands;Wash/dry face;Oral care;Min guard;Standing Grooming Details (indicate cue type and reason): decreased throughness, and cues to locate all needed items  Upper Body Bathing: Minimal assitance;Sitting   Lower Body Bathing: Moderate assistance;Sit to/from stand Lower Body Bathing Details (indicate cue type and reason): decreased thoroughness and assist for balance  Upper Body Dressing : Minimal assistance;Moderate assistance;Sitting   Lower Body Dressing: Minimal assistance;Sit to/from stand Lower Body Dressing Details (indicate cue type and reason): able to don/doff socks  Toilet Transfer: Moderate assistance;Ambulation;RW;Comfort height toilet Toilet Transfer Details (indicate cue type and reason): mod A due to difficulty maneuvering in small spaces.  Freqently runs into items, loses balance and requires assist to prevent fall  Toileting- Clothing Manipulation and Hygiene: Minimal assistance;Sit to/from stand Toileting - Clothing Manipulation Details (indicate cue type and reason): assist for thouroughness      Functional mobility during ADLs: Minimal assistance;Moderate assistance;Rolling walker General ADL Comments: Pt with poor awareness of environment.  He frequently runs into obstacles in his environment requiring mod A to prevent fall       Vision                 Additional Comments: appears to have impaired scanning    Perception     Praxis      Cognition   Behavior During Therapy: Flat  affect;Impulsive Overall Cognitive Status: Impaired/Different from baseline Area of Impairment: Orientation;Attention;Memory;Following commands;Safety/judgement;Awareness;Problem solving Orientation Level:  Disoriented to;Situation;Time (reliant on external cues ) Current Attention Level: Selective Memory: Decreased short-term memory  Following Commands: Follows one step commands consistently Safety/Judgement: Decreased awareness of safety;Decreased awareness of deficits Awareness: Intellectual Problem Solving: Slow processing;Difficulty sequencing;Requires verbal cues;Requires tactile cues General Comments: Pt requires cues for problem solving.  Demonstrates impaired memory     Extremity/Trunk Assessment               Exercises General Exercises - Lower Extremity Long Arc Quad: AROM;15 reps;Both;Seated Hip ABduction/ADduction: AROM;15 reps;Both;Seated Hip Flexion/Marching: AROM;15 reps;Both;Seated Toe Raises: AROM;15 reps;Both;Seated Heel Raises: AROM;15 reps;Both;Seated   Shoulder Instructions       General Comments      Pertinent Vitals/ Pain       Pain Assessment: 0-10  Home Living       Type of Home: House                              Lives With: Spouse    Prior Functioning/Environment              Frequency  Min 2X/week        Progress Toward Goals  OT Goals(current goals can now be found in the care plan section)  Progress towards OT goals: Progressing toward goals  ADL Goals Pt Will Perform Eating: Independently;sitting Pt Will Perform Grooming: with supervision;standing Pt Will Perform Upper Body Dressing: with supervision;sitting Pt Will Perform Lower Body Dressing: with supervision;sit to/from stand Pt Will Transfer to Toilet: with supervision;ambulating Pt Will Perform Toileting - Clothing Manipulation and hygiene: with supervision;sit to/from stand Pt Will Perform Tub/Shower Transfer: Shower transfer;ambulating;with supervision;shower seat;rolling walker Pt/caregiver will Perform Home Exercise Program: Increased strength;Both right and left upper extremity;With theraputty;With theraband;With Supervision Additional ADL Goal  #1: Pt will identify and gather items necessary to perform ADL with supervision.  Plan Discharge plan remains appropriate    Co-evaluation                 End of Session Equipment Utilized During Treatment: Rolling walker;Gait belt   Activity Tolerance Patient tolerated treatment well   Patient Left in bed;with call bell/phone within reach;with bed alarm set   Nurse Communication Mobility status        Time: 1413 [phone] OT Time Calculation (min): 26 min  Charges: OT General Charges $OT Visit: 1 Procedure OT Treatments $Self Care/Home Management : 8-22 mins $Therapeutic Activity: 8-22 mins  Persephonie Hegwood M 11/07/2015, 3:31 PM

## 2015-11-07 NOTE — Clinical Social Work Note (Signed)
CSW reviewed PT eval for SNF backup to CIR. Patient not a candidate for SNF due to walking 350 feet min assist.   CSW signing off. Consult again if any social work needs arise.  Dayton Scrape, [place]

## 2015-11-07 NOTE — Progress Notes (Signed)
Speech Language Pathology Treatment: Dysphagia  Patient Details Name: Yue Glasheen MRN: [id number] DOB: 21-Jul-1955 Today's Date: 11/07/2015 Time: 1624-4695 SLP Time Calculation (min) (ACUTE ONLY): 10 min  Assessment / Plan / Recommendation Clinical Impression  F/u diet tolerance assessment and diagnostic treatment complete. Patient able to self feed regular texture po solids without overt indication of oral difficulty or aspiration. No anterior spillage of liquids or oral residuals noted today with independent use of slow rate of intake and small bites. No further SLP needs indicated for dysphagia.    HPI HPI: Mr. 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 In the ED he was found to be febrile and combative and required intubation 10/28. Two days prior to his presentation he had a urologic procedure done (details unclear), but it was a trans-urethral and trans-rectal approach to address his prostatic hypertrophy. Extubated 10/31. Dx with sepsis, group B strep bacteremia. Incidental findings of renal mass suspicious for malignancy.      SLP Plan  All goals met     Recommendations  Diet recommendations: Regular;Thin liquid Liquids provided via: Cup;Straw Medication Administration: Whole meds with liquid Supervision: Patient able to self feed;Full supervision/cueing for compensatory strategies Compensations: Slow rate;Small sips/bites Postural Changes and/or Swallow Maneuvers: Seated upright 90 degrees;Upright 30-60 min after meal                Oral Care Recommendations: Oral care BID Follow up Recommendations: None Plan: All goals met       [place], CCC-SLP [phone]    Girolamo Lortie Meryl 11/07/2015, 3:16 PM

## 2015-11-07 NOTE — Progress Notes (Signed)
PROGRESS NOTE  [name] [id number] DOB: [birth year] DOA: 10/29/2015 PCP: Henrine Screws, MD   LOS: 9 days   Brief Narrative: 61 yo male presented with altered mental status, incontinent of bowel/bladder, fever, and respiratory failure.  He had cystoscopy and transrectal u/s for BPH 2 days prior to admission and given cipro for prophylaxis.  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  Noted to have Rt ear infection.  Found to have Group B Strep bacteremia and meningitis.  SIGNIFICANT EVENTS: 10/28 intubated in the ED for agitation 10/29 cardiology consulted for TEE, ID consulted for bacteremia 11/01 urology consulted, LP by IR  Assessment & Plan: Active Problems:   Sepsis ([place])   Delirium   Respiratory failure (HCC)   Acute encephalopathy   Elevated troponin   MVP (mitral valve prolapse)   Mitral valve insufficiency   Bacteremia   GBS bacteremia - ID consulted, appreciate Dr. Henreitta Leber input - TEE negative for bacteremia  - on penicillin, continue. PICC line placed 11/4 - Patient will need 2 weeks of IV penicillin based on negative blood cultures on 10/30, plan to continue through 11/13/2015 - surveillance cultures remained negative, final  Acute encephalopathy 2nd to meningitis >> much improved. - monitor mental status, now AxOx4 - hold outpt bupropion for now - CIR to evaluate patient for inpatient rehabilitation, d/w Reesa Chew, PA, PT to re-evaluate today prior to final recs  Rt Otitis Externa - Continue Ciprodex through 11/7  Meningitis - Abx per ID, as above  Right renal mass - CT scan abdomen and pelvis on 10/31 showed right kidney mass up to 53 mm, suspicious for renal malignancy. Urology is following, appreciate input  Prior CVAs - No new neurologic findings, patient recovered well after these CVAs in the past  Hx of BPH - f/u with urology  Elevated blood pressure, sinus tachycardia - Now on metoprolol,  improved  Hx of HLD - continue outpt crestor  Hx of MVP - added lopressor 11/03  Nutrition  Dysphagia - D1 diet - f/u with speech therapy  Deconditioning - PT, OT   DVT prophylaxis: Lovenox Code Status: Full Family Communication: wife bedside Disposition Plan: CIR to evaluate  Consultants:   ID  Urology  PCCM  CULTURES: Blood 10/28 >> Group B Strep Respiratory viral panel 10/28 >> negative Urine 10/28 >> negative Sputum 10/29 >> negative Blood 10/30 >> CSF 11/01 >>  CSF HSV 11/01 >> negative  ANTIBIOTICS: Vancomycin 10/28 >> 10/30 Zosyn 10/28 >> 10/30 Pencillin 10/30 >> Cipro ear drops 11/02 >>   LINES/TUBES: ETT 10/28 >> 10/31  Subjective: - no chest pain, shortness of breath, no abdominal pain, nausea or vomiting. Slept better overnight   Objective: Vitals:   11/06/15 1321 11/06/15 2022 11/07/15 0633 11/07/15 0818  BP: 129/64 132/72 125/77 127/70  Pulse: 76 83 84 85  Resp: 18 18 18    Temp: 98.3 F (36.8 C) 97.8 F (36.6 C) 97.4 F (36.3 C)   TempSrc: Oral Oral Oral   SpO2: 95% 96% 100% 96%  Weight:   82.7 kg (182 lb 4.8 oz)   Height:        Intake/Output Summary (Last 24 hours) at 11/07/15 1135 Last data filed at 11/07/15 [id number]  Gross per 24 hour  Intake          1941.83 ml  Output             1500 ml  Net  441.83 ml   Filed Weights   11/05/15 0300 11/06/15 0403 11/07/15 0633  Weight: 109 kg (240 lb 4.8 oz) 110.6 kg (243 lb 13.3 oz) 82.7 kg (182 lb 4.8 oz)    Examination: Constitutional: NAD Vitals:   11/06/15 1321 11/06/15 2022 11/07/15 0633 11/07/15 0818  BP: 129/64 132/72 125/77 127/70  Pulse: 76 83 84 85  Resp: 18 18 18    Temp: 98.3 F (36.8 C) 97.8 F (36.6 C) 97.4 F (36.3 C)   TempSrc: Oral Oral Oral   SpO2: 95% 96% 100% 96%  Weight:   82.7 kg (182 lb 4.8 oz)   Height:       Eyes: PERRL, lids and conjunctivae normal Respiratory: clear to auscultation bilaterally, no wheezing, no crackles.    Cardiovascular: Regular rate and rhythm, no murmurs / rubs / gallops. No LE edema. 2+ pedal pulses. No carotid bruits.  Abdomen: no tenderness. Bowel sounds positive.  Musculoskeletal: no clubbing / cyanosis.  Skin: no rashes, lesions, ulcers. No induration Neurologic: non focal. AxOx4   Data Reviewed: I have personally reviewed following labs and imaging studies  CBC:  Recent Labs Lab 11/01/15 0146 11/02/15 0524 11/03/15 0247 11/06/15 0500  WBC 13.4* 11.9* 12.3* 14.2*  HGB 12.6* 12.6* 13.9 13.9  HCT 39.2 36.8* 40.2 40.6  MCV 92.5 87.2 85.4 86.0  PLT 184 223 257 [id number]*   Basic Metabolic Panel:  Recent Labs Lab 11/01/15 0146 11/02/15 0524 11/03/15 0247 11/04/15 0812 11/05/15 0235  NA 142 138 135 135 131*  K 4.0 3.9 3.4* 3.3* 4.0  CL 108 106 103 102 102  CO2 26 23 21* 23 19*  GLUCOSE 123* 137* 96 122* 120*  BUN 23* 18 17 13 16   CREATININE 1.02 0.77 0.83 0.90 0.90  CALCIUM 8.7* 8.4* 8.5* 8.9 8.8*  MG 2.1 1.9 2.0  --   --   PHOS 3.3 2.5 2.6  --   --    GFR: Estimated Creatinine Clearance: 92.6 mL/min (by C-G formula based on SCr of 0.9 mg/dL). Liver Function Tests: No results for input(s): AST, ALT, ALKPHOS, BILITOT, PROT, ALBUMIN in the last 168 hours. No results for input(s): LIPASE, AMYLASE in the last 168 hours. No results for input(s): AMMONIA in the last 168 hours. Coagulation Profile: No results for input(s): INR, PROTIME in the last 168 hours. Cardiac Enzymes: No results for input(s): CKTOTAL, CKMB, CKMBINDEX, TROPONINI in the last 168 hours. BNP (last 3 results) No results for input(s): PROBNP in the last 8760 hours. HbA1C: No results for input(s): HGBA1C in the last 72 hours. CBG:  Recent Labs Lab 11/02/15 0816 11/02/15 1109 11/02/15 1606 11/02/15 2003 11/03/15 0008  GLUCAP 87 92 114* 92 105*   Lipid Profile: No results for input(s): CHOL, HDL, LDLCALC, TRIG, CHOLHDL, LDLDIRECT in the last 72 hours. Thyroid Function Tests: No results for  input(s): TSH, T4TOTAL, FREET4, T3FREE, THYROIDAB in the last 72 hours. Anemia Panel: No results for input(s): VITAMINB12, FOLATE, FERRITIN, TIBC, IRON, RETICCTPCT in the last 72 hours. Urine analysis:    Component Value Date/Time   COLORURINE YELLOW 10/29/2015 [place] 10/29/2015 0355   LABSPEC 1.015 10/29/2015 0355   PHURINE 7.5 10/29/2015 0355   GLUCOSEU NEGATIVE 10/29/2015 0355   HGBUR TRACE (A) 10/29/2015 [place] NEGATIVE 10/29/2015 [place] 10/29/2015 0355   PROTEINUR NEGATIVE 10/29/2015 0355   UROBILINOGEN 0.2 03/15/2008 1023   NITRITE NEGATIVE 10/29/2015 0355   LEUKOCYTESUR NEGATIVE 10/29/2015 0355  Sepsis Labs: Invalid input(s): PROCALCITONIN, LACTICIDVEN  Recent Results (from the past 240 hour(s))  Urine culture     Status: None   Collection Time: 10/29/15  3:55 AM  Result Value Ref Range Status   Specimen Description URINE, RANDOM  Final   Special Requests NONE  Final   Culture NO GROWTH  Final   Report Status 10/30/2015 FINAL  Final  Blood Culture (routine x 2)     Status: Abnormal   Collection Time: 10/29/15  4:00 AM  Result Value Ref Range Status   Specimen Description BLOOD LEFT ARM  Final   Special Requests BOTTLES DRAWN AEROBIC AND ANAEROBIC 5ML  Final   Culture  Setup Time   Final    GRAM POSITIVE COCCI IN CHAINS IN BOTH AEROBIC AND ANAEROBIC BOTTLES CRITICAL RESULT CALLED TO, READ BACK BY AND VERIFIED WITH: R RUMBARGER,PHARMD AT [id number] 10/29/15 BY L BENFIELD    Culture GROUP B STREP(S.AGALACTIAE)ISOLATED (A)  Final   Report Status 10/31/2015 FINAL  Final   Organism ID, Bacteria GROUP B STREP(S.AGALACTIAE)ISOLATED  Final      Susceptibility   Group b strep(s.agalactiae)isolated - MIC*    CLINDAMYCIN <=0.25 SENSITIVE Sensitive     AMPICILLIN <=0.25 SENSITIVE Sensitive     ERYTHROMYCIN <=0.12 SENSITIVE Sensitive     VANCOMYCIN 0.5 SENSITIVE Sensitive     CEFTRIAXONE <=0.12 SENSITIVE Sensitive      LEVOFLOXACIN 1 SENSITIVE Sensitive     * GROUP B STREP(S.AGALACTIAE)ISOLATED  Blood Culture ID Panel (Reflexed)     Status: Abnormal   Collection Time: 10/29/15  4:00 AM  Result Value Ref Range Status   Enterococcus species NOT DETECTED NOT DETECTED Final   Listeria monocytogenes NOT DETECTED NOT DETECTED Final   Staphylococcus species NOT DETECTED NOT DETECTED Final   Staphylococcus aureus NOT DETECTED NOT DETECTED Final   Streptococcus species DETECTED (A) NOT DETECTED Final    Comment: CRITICAL RESULT CALLED TO, READ BACK BY AND VERIFIED WITH: R RUMBARGER,PHARMD AT 1541 10/29/15 BY L BENFIELD    Streptococcus agalactiae DETECTED (A) NOT DETECTED Final    Comment: CRITICAL RESULT CALLED TO, READ BACK BY AND VERIFIED WITH: R RUMBARGER,PHARMD AT 1541 10/29/15 BY L BENFIELD    Streptococcus pneumoniae NOT DETECTED NOT DETECTED Final   Streptococcus pyogenes NOT DETECTED NOT DETECTED Final   Acinetobacter baumannii NOT DETECTED NOT DETECTED Final   Enterobacteriaceae species NOT DETECTED NOT DETECTED Final   Enterobacter cloacae complex NOT DETECTED NOT DETECTED Final   Escherichia coli NOT DETECTED NOT DETECTED Final   Klebsiella oxytoca NOT DETECTED NOT DETECTED Final   Klebsiella pneumoniae NOT DETECTED NOT DETECTED Final   Proteus species NOT DETECTED NOT DETECTED Final   Serratia marcescens NOT DETECTED NOT DETECTED Final   Haemophilus influenzae NOT DETECTED NOT DETECTED Final   Neisseria meningitidis NOT DETECTED NOT DETECTED Final   Pseudomonas aeruginosa NOT DETECTED NOT DETECTED Final   Candida albicans NOT DETECTED NOT DETECTED Final   Candida glabrata NOT DETECTED NOT DETECTED Final   Candida krusei NOT DETECTED NOT DETECTED Final   Candida parapsilosis NOT DETECTED NOT DETECTED Final   Candida tropicalis NOT DETECTED NOT DETECTED Final  Blood Culture (routine x 2)     Status: Abnormal   Collection Time: 10/29/15  4:15 AM  Result Value Ref Range Status   Specimen  Description BLOOD LEFT ARM  Final   Special Requests IN PEDIATRIC BOTTLE 2ML  Final   Culture  Setup Time   Final    GRAM  POSITIVE COCCI IN CHAINS IN PEDIATRIC BOTTLE CRITICAL RESULT CALLED TO, READ BACK BY AND VERIFIED WITH: R RUMBARGER,PHARMD AT [id number] 10/29/15 BY L BENFIELD    Culture (A)  Final    GROUP B STREP(S.AGALACTIAE)ISOLATED SUSCEPTIBILITIES PERFORMED ON PREVIOUS CULTURE WITHIN THE LAST 5 DAYS.    Report Status 10/31/2015 FINAL  Final  MRSA PCR Screening     Status: None   Collection Time: 10/29/15  6:39 AM  Result Value Ref Range Status   MRSA by PCR NEGATIVE NEGATIVE Final    Comment:        The GeneXpert MRSA Assay (FDA approved for NASAL specimens only), is one component of a comprehensive MRSA colonization surveillance program. It is not intended to diagnose MRSA infection nor to guide or monitor treatment for MRSA infections.   Respiratory Panel by PCR     Status: None   Collection Time: 10/29/15 11:15 AM  Result Value Ref Range Status   Adenovirus NOT DETECTED NOT DETECTED Final   Coronavirus 229E NOT DETECTED NOT DETECTED Final   Coronavirus HKU1 NOT DETECTED NOT DETECTED Final   Coronavirus NL63 NOT DETECTED NOT DETECTED Final   Coronavirus OC43 NOT DETECTED NOT DETECTED Final   Metapneumovirus NOT DETECTED NOT DETECTED Final   Rhinovirus / Enterovirus NOT DETECTED NOT DETECTED Final   Influenza A NOT DETECTED NOT DETECTED Final   Influenza B NOT DETECTED NOT DETECTED Final   Parainfluenza Virus 1 NOT DETECTED NOT DETECTED Final   Parainfluenza Virus 2 NOT DETECTED NOT DETECTED Final   Parainfluenza Virus 3 NOT DETECTED NOT DETECTED Final   Parainfluenza Virus 4 NOT DETECTED NOT DETECTED Final   Respiratory Syncytial Virus NOT DETECTED NOT DETECTED Final   Bordetella pertussis NOT DETECTED NOT DETECTED Final   Chlamydophila pneumoniae NOT DETECTED NOT DETECTED Final   Mycoplasma pneumoniae NOT DETECTED NOT DETECTED Final  Culture, respiratory  (NON-Expectorated)     Status: None   Collection Time: 10/30/15 11:22 AM  Result Value Ref Range Status   Specimen Description TRACHEAL ASPIRATE  Final   Special Requests NONE  Final   Gram Stain   Final    ABUNDANT WBC PRESENT,BOTH PMN AND MONONUCLEAR RARE SQUAMOUS EPITHELIAL CELLS PRESENT RARE GRAM POSITIVE COCCI IN PAIRS RARE GRAM POSITIVE RODS    Culture RARE CANDIDA ALBICANS  Final   Report Status 11/01/2015 FINAL  Final  Culture, blood (Routine X 2) w Reflex to ID Panel     Status: None   Collection Time: 10/31/15  9:28 AM  Result Value Ref Range Status   Specimen Description BLOOD RIGHT ARM  Final   Special Requests IN PEDIATRIC BOTTLE 2CC  Final   Culture NO GROWTH 5 DAYS  Final   Report Status 11/05/2015 FINAL  Final  Culture, blood (Routine X 2) w Reflex to ID Panel     Status: None   Collection Time: 10/31/15  9:32 AM  Result Value Ref Range Status   Specimen Description BLOOD RIGHT HAND  Final   Special Requests IN PEDIATRIC BOTTLE 3CC  Final   Culture NO GROWTH 5 DAYS  Final   Report Status 11/05/2015 FINAL  Final  CSF culture     Status: None   Collection Time: 11/02/15  2:41 PM  Result Value Ref Range Status   Specimen Description CSF  Final   Special Requests NONE  Final   Gram Stain   Final    WBC PRESENT,BOTH PMN AND MONONUCLEAR NO ORGANISMS SEEN CYTOSPIN SMEAR  Culture NO GROWTH 3 DAYS  Final   Report Status 11/05/2015 FINAL  Final  Fungus Culture With Stain     Status: None (Preliminary result)   Collection Time: 11/02/15  2:41 PM  Result Value Ref Range Status   Fungus Stain Final report  Final    Comment: (NOTE) Performed At: [facility] [place], [place] [id number] Lindon Romp MD [id number]    Fungus (Mycology) Culture PENDING  Incomplete   Fungal Source CSF  Final  Fungus Culture Result     Status: None   Collection Time: 11/02/15  2:41 PM  Result Value Ref Range Status   Result 1 Comment  Final    Comment:  (NOTE) KOH/Calcofluor preparation:  no fungus observed. Performed At: [facility] [address] [place], [place] [id number] Lindon Romp MD [id number]       Radiology Studies: No results found.   Scheduled Meds: . benzonatate  100 mg Oral TID  . ciprofloxacin-dexamethasone  4 drop Right Ear BID  . enoxaparin (LOVENOX) injection  40 mg Subcutaneous Q24H  . finasteride  5 mg Oral Daily  . mouth rinse  15 mL Mouth Rinse BID  . metoprolol tartrate  25 mg Oral BID  . pencillin G potassium IV  4 Million Units Intravenous Q4H  . rosuvastatin  10 mg Oral Daily   Continuous Infusions:   Marzetta Board, MD, PhD [facility] Pager [phone]  If 7PM-7AM, please contact night-coverage [email or web address] Password [email or web address] 11/07/2015, 11:35 AM

## 2015-11-07 NOTE — Progress Notes (Addendum)
I met with pt at bedside to discuss a possible inpt rehab admission before he is discharged home. He is in agreement, but is cognitively impaired. I have placed a call to his wife, [name], to further discuss plans. I wil begin insurance authorization for a possible admit. [id number]  Pt's wife spoke with me by phone and she is in agreement to admission pending insurance approval. [phone]

## 2015-11-07 NOTE — Evaluation (Signed)
Speech Language Pathology Evaluation Patient Details Name: [name] MRN: [id number] DOB: 1955/07/12 Today's Date: 11/07/2015 Time: [id number] SLP Time Calculation (min) (ACUTE ONLY): 18 min  Problem List:  Patient Active Problem List   Diagnosis Date Noted  . History of CVA with residual deficit   . History of endocarditis   . 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 Meningitis   . Leukocytosis   . ETOH abuse   . Hyponatremia   . Hypokalemia   . Dysphagia   . Bacteremia   . Delirium   . Respiratory failure ([place])   . Acute encephalopathy   . Elevated troponin   . MVP (mitral valve prolapse)   . Mitral valve insufficiency   . Sepsis ([place]) 10/29/2015  . Dyslipidemia 10/26/2010  . Mitral valve disorders(424.0) 08/25/2008  . DVT 08/25/2008  . SEIZURE DISORDER 08/25/2008  . CHEST PAIN 08/25/2008  . DYSPHAGIA UNSPECIFIED 08/25/2008  . MYOCARDIAL INFARCTION, ANTERIOR WALL, INITIAL EPISODE 06/18/2008  . 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 Other and unspecified hyperlipidemia 06/11/2008  . Essential hypertension 06/11/2008  . ENDOCARDITIS, BACTERIAL, ACUTE 06/11/2008  . FOLLICULITIS 0000000  . NEPHROLITHIASIS, HX OF 06/11/2008   Past Medical History:  Past Medical History:  Diagnosis Date  . CVA (cerebral infarction)    hemmorhagic  CVA  . Dyslipidemia   . Endocarditis    [place]  . Mitral regurgitation   . Myocardial infarction   . Stroke [facility]    Past Surgical History:  Past Surgical History:  Procedure Laterality Date  . 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 In the ED he was found to be febrile and combative and required intubation 10/28. Two days prior to his presentation he had a urologic procedure done (details unclear), but it was a trans-urethral and trans-rectal approach to  address his prostatic hypertrophy. Extubated 10/31. Dx with sepsis, group B strep bacteremia. Incidental findings of renal mass suspicious for malignancy.   Assessment / Plan / Recommendation Clinical Impression  Cognitive-linguistic evaluation complete. Patient presents with impairments in the areas of sustained attention, awareness, and high level problem solving. Additionally, patient verbally impulsive and with impaired pragmatics (unclear baseline). Patient reports decreased hearing acuity which is impacting communication.     SLP Assessment  Patient needs continued Speech Lanaguage Pathology Services    Follow Up Recommendations  None    Frequency and Duration min 2x/week  2 weeks      SLP Evaluation Cognition  Overall Cognitive Status: Impaired/Different from baseline Arousal/Alertness: Awake/alert Orientation Level: Oriented X4 Attention: Sustained Sustained Attention: Impaired Sustained Attention Impairment: Verbal complex;Functional complex Memory: Appears intact Awareness: Impaired Awareness Impairment: Intellectual impairment Problem Solving: Impaired Problem Solving Impairment: Verbal complex;Functional complex Executive Function: Self Monitoring Self Monitoring: Impaired Self Monitoring Impairment: Verbal complex;Functional complex Behaviors: Impulsive Safety/Judgment: Appears intact       Comprehension  Auditory Comprehension Overall Auditory Comprehension: Appears within functional limits for tasks assessed Visual Recognition/Discrimination Discrimination: Not tested Reading Comprehension Reading Status: Within funtional limits    Expression Expression Primary Mode of Expression: Verbal Verbal Expression Overall Verbal Expression: Appears within functional limits for tasks assessed   Oral / Motor  Oral Motor/Sensory Function Overall Oral Motor/Sensory Function: Other (comment) (baseline left sided weakness from previous CVA) Motor Speech Overall  Motor  Speech: Impaired at baseline (mild dysarthria at baseline)   [place] MA, CCC-SLP [phone]         [name] 11/07/2015, 3:29 PM

## 2015-11-07 NOTE — Consult Note (Signed)
Physical Medicine and Rehabilitation Consult   Reason for Consult: Encephalopathy in setting of sepsis Referring Physician: Dr. Halford Chessman.    HPI: [name] is a 60 y.o. 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 He was found to be septic due to group B strep agalactiae and was intubated due to agitation. 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 2 D echo done revealing EF 60-65% with mild AVR and questionable density anterior mitral valve with MVP. TEE negative for endocarditis, no thrombus or PFO. EEG showed diffuse cerebral dysfunction question due to hypoxic/ischemic injury, encephalopathy or propofol. He tolerated extubation on 10/31 and antibiotics narrowed to PCN G per ID input.   Dr. [name] felt that GBS bacteremia due to recent procedure or immunosuppression from occult malignancy. To continue IV antibiotics for 2 weeks total after last negative BC.  Neurology consulted due to ongoing issues with agitation and LP done showing evidence of meningitis. 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 Drainage from ear due to otitis externa treated with ciprodex and improving.  Dr. [name] has followed up with patient and recommends partial nephrectomy --to follow up with Dr. [name] after holidays. Mentation improving and PT/OT evaluations done showing generalized weakness with cognitive deficits. CIR recommended for follow up therapy.  Mental status improving and    Review of Systems  Eyes: Negative for blurred  vision and double vision.  Respiratory: Negative for cough and hemoptysis.   Cardiovascular: Negative for chest pain and palpitations.  Neurological: Positive for speech change and focal weakness. Negative for sensory change.       HOH  All other systems reviewed and are negative.     Past Medical History:  Diagnosis Date  . CVA (cerebral infarction)    hemmorhagic  CVA  . Dyslipidemia   . Endocarditis    MV Vegitation  . Mitral regurgitation   . Myocardial infarction   . Stroke [facility])     Past Surgical History:  Procedure Laterality Date  . NO PAST SURGERIES      Family History  Problem Relation Age of Onset  . Heart disease Mother     mom RF as a child (died of heart problem in  43 )    Social History:  Married--wife works but has a flexible schedule. Disabled Chief Executive Officer. He reports that he has never smoked. He has never used smokeless tobacco.  He drinks 1/2 bottle of wine daily? He reports that he does not drink use drugs.    Allergies: No Known Allergies    Medications Prior to Admission  Medication Sig Dispense Refill  . buPROPion (WELLBUTRIN XL) 150 MG 24 hr tablet Take 300 mg by mouth daily.    . Cholecalciferol (VITAMIN D PO) Take 1 tablet by mouth daily.    . diphenhydramine-acetaminophen (TYLENOL PM) 25-500 MG TABS tablet Take 2 tablets by mouth at bedtime.    . Multiple Vitamin (MULTIVITAMIN WITH MINERALS) TABS tablet Take 1 tablet by mouth daily.    . rosuvastatin (CRESTOR) 10 MG tablet Take 1 tablet (10 mg total) by mouth  daily. 90 tablet 3    Home: Home Living Family/patient expects to be discharged to:: Private residence Living Arrangements: Spouse/significant other Available Help at Discharge: Family, Available 24 hours/day Type of Home: House Home Access: Stairs to enter Technical brewer of Steps: 3 Entrance Stairs-Rails: None Home Layout: Two level Alternate Level Stairs-Number of Steps: 15 Alternate Level Stairs-Rails: Can reach  both Bathroom Shower/Tub: Multimedia programmer: Handicapped height Home Equipment: None  Functional History: Prior Function Level of Independence: Independent Comments: just finished a bike tour of [place] Functional Status:  Mobility: Bed Mobility Overal bed mobility: Needs Assistance Bed Mobility: Supine to Sit Supine to sit: Min guard General bed mobility comments: Min guard for safety, some impulsivitity noted Transfers Overall transfer level: Needs assistance Equipment used: Rolling walker (2 wheeled) Transfers: Sit to/from Stand Sit to Stand: Min assist General transfer comment: min assist for stability with elevation to upright positioning Ambulation/Gait Ambulation/Gait assistance: Mod assist Ambulation Distance (Feet): 38 Feet Assistive device: Rolling walker (2 wheeled) Gait Pattern/deviations: Step-through pattern, Decreased stride length, Ataxic, Staggering right, Narrow base of support General Gait Details: patient very unsteady with ambulation, difficulty sequencing step length and gait. Hands on physical assist required for stability and increased assist for manual maneuvering of RW Gait velocity: decreased Gait velocity interpretation: Below normal speed for age/gender    ADL: ADL Overall ADL's : Needs assistance/impaired Eating/Feeding: Supervision/ safety, Sitting Eating/Feeding Details (indicate cue type and reason): increased spillage with spoon, instructed wife to allow pt to self feed Grooming: Wash/dry hands, Wash/dry face, Sitting, Minimal assistance Grooming Details (indicate cue type and reason): decreased thoroughness Upper Body Bathing: Moderate assistance, Sitting Lower Body Bathing: Moderate assistance, Sit to/from stand Upper Body Dressing : Moderate assistance, Sitting Lower Body Dressing: Moderate assistance, Sit to/from stand Toileting- Water quality scientist and Hygiene: Maximal assistance, Sit to/from stand Functional mobility  during ADLs: Rolling walker, Moderate assistance General ADL Comments: Pt in urine soaked bed without awareness.  Cognition: Cognition Overall Cognitive Status: Impaired/Different from baseline Orientation Level: Oriented to person, Oriented to place, Oriented to time, Oriented to situation Cognition Arousal/Alertness: Awake/alert Behavior During Therapy: Impulsive Overall Cognitive Status: Impaired/Different from baseline Area of Impairment: Orientation, Attention, Following commands, Safety/judgement, Awareness, Problem solving Orientation Level: Disoriented to, Situation Current Attention Level: Selective Following Commands: Follows one step commands consistently Safety/Judgement: Decreased awareness of safety, Decreased awareness of deficits Awareness: Emergent Problem Solving: Slow processing, Difficulty sequencing, Requires verbal cues, Requires tactile cues  Blood pressure 127/70, pulse 85, temperature 97.4 F (36.3 C), temperature source Oral, resp. rate 18, height 5\' 8"  (1.727 m), weight 82.7 kg (182 lb 4.8 oz), SpO2 96 %. Physical Exam  Nursing note and vitals reviewed. Constitutional: He is oriented to person, place, and time. He appears well-developed and well-nourished.  HENT:  Head: Normocephalic and atraumatic.  Mouth/Throat: Oropharynx is clear and moist.  Right ear with crusted drainage  Eyes: Conjunctivae and EOM are normal. Pupils are equal, round, and reactive to light.  Neck: Normal range of motion. Neck supple.  Cardiovascular: Normal rate and regular rhythm.   Respiratory: Effort normal and breath sounds normal. No stridor. No respiratory distress. He has no wheezes.  GI: Soft. Bowel sounds are normal. He exhibits no distension. There is no tenderness.  Musculoskeletal: He exhibits no edema or tenderness.  Neurological: He is alert and oriented to person, place, and time.  HOH.  Mild facial droop with dysarthria.  Perseverative tendencies with poor safety  awareness. Impulsive with question of apraxia but  able to follow simpler one step motor commands.  Sensation intact to light touch Motor: RUE/RLE: 4+/5 proximal to distal LUE/LLE: 4-4+/5  Skin: Skin is warm and dry.  Psychiatric: He has a normal mood and affect. His speech is normal. He expresses impulsivity and inappropriate judgment. He is inattentive.    No results found for this or any previous visit (from the past 24 hour(s)). No results found.  Assessment/Plan: Diagnosis: Encephalopathy  Labs and images independently reviewed.  Records reviewed and summated above.  1. Does the need for close, 24 hr/day medical supervision in concert with the patient's rehab needs make it unreasonable for this patient to be served in a less intensive setting? Yes  2. Co-Morbidities requiring supervision/potential complications: MV endocarditis and embolic stroke [id number] (cont meds, mild left sided weakness), reactive emotional dyscontrol syndrome , BPH s/p TURP on 10/29/15, confusion, agitation, renal malignancy (plan for outpt follow up), AVR, MVP (Monitor in accordance with increased physical activity and avoid UE resistance excercises), meningitis (cont meds), ventriculitis, ETOH abuse (counsel), leukocytosis (cont to monitor for signs and symptoms of infection, further workup if indicated), hyponatremia (cont to monitor, treat if necessary), hypokalemia (continue to monitor and replete as necessary), dysphagia (SLP, advance diet as tolerated) 3. Due to bladder management, safety, skin/wound care, disease management and patient education, does the patient require 24 hr/day rehab nursing? Yes 4. Does the patient require coordinated care of a physician, rehab nurse, PT (1-2 hrs/day, 5 days/week), OT (1-2 hrs/day, 5 days/week) and SLP (1-2 hrs/day, 5 days/week) to address physical and functional deficits in the context of the above medical diagnosis(es)? Yes Addressing deficits in the following areas: balance,  endurance, locomotion, strength, transferring, bathing, dressing, toileting, cognition, swallowing and psychosocial support 5. Can the patient actively participate in an intensive therapy program of at least 3 hrs of therapy per day at least 5 days per week? Yes 6. The potential for patient to make measurable gains while on inpatient rehab is good 7. Anticipated functional outcomes upon discharge from inpatient rehab are modified independent  with PT, modified independent with OT, independent and modified independent with SLP. 8. Estimated rehab length of stay to reach the above functional goals is: 5-9 days. 9. Does the patient have adequate social supports and living environment to accommodate these discharge functional goals? Yes 10. Anticipated D/C setting: Home 11. Anticipated post D/C treatments: HH therapy and Home excercise program 12. Overall Rehab/Functional Prognosis: good  RECOMMENDATIONS: This patient's condition is appropriate for continued rehabilitative care in the following setting: CIR once medically stable Patient has agreed to participate in recommended program. Yes Note that insurance prior authorization may be required for reimbursement for recommended care.  Comment: Rehab Admissions Coordinator to follow up.  Delice Lesch, MD, [facility] 11/07/2015

## 2015-11-08 ENCOUNTER — Inpatient Hospital Stay (HOSPITAL_COMMUNITY)
Admission: RE | Admit: 2015-11-08 | Discharge: 2015-11-14 | DRG: 947 | Disposition: A | Discharge status: Home Health | Payer: BLUE CROSS/BLUE SHIELD | Source: Intra-hospital | Attending: Physical Medicine & Rehabilitation | Admitting: Physical Medicine & Rehabilitation

## 2015-11-08 ENCOUNTER — Encounter (HOSPITAL_COMMUNITY): Payer: Self-pay | Admitting: General Practice

## 2015-11-08 DIAGNOSIS — I159 Secondary hypertension, unspecified: Secondary | ICD-10-CM | POA: Diagnosis not present

## 2015-11-08 DIAGNOSIS — H6122 Impacted cerumen, left ear: Secondary | ICD-10-CM | POA: Diagnosis not present

## 2015-11-08 DIAGNOSIS — C649 Malignant neoplasm of unspecified kidney, except renal pelvis: Secondary | ICD-10-CM | POA: Diagnosis present

## 2015-11-08 DIAGNOSIS — Z9079 Acquired absence of other genital organ(s): Secondary | ICD-10-CM | POA: Diagnosis not present

## 2015-11-08 DIAGNOSIS — D72820 Lymphocytosis (symptomatic): Secondary | ICD-10-CM | POA: Diagnosis not present

## 2015-11-08 DIAGNOSIS — I10 Essential (primary) hypertension: Secondary | ICD-10-CM | POA: Diagnosis present

## 2015-11-08 DIAGNOSIS — A419 Sepsis, unspecified organism: Secondary | ICD-10-CM | POA: Diagnosis present

## 2015-11-08 DIAGNOSIS — H6090 Unspecified otitis externa, unspecified ear: Secondary | ICD-10-CM | POA: Diagnosis present

## 2015-11-08 DIAGNOSIS — Z79899 Other long term (current) drug therapy: Secondary | ICD-10-CM

## 2015-11-08 DIAGNOSIS — G039 Meningitis, unspecified: Secondary | ICD-10-CM | POA: Diagnosis not present

## 2015-11-08 DIAGNOSIS — R5381 Other malaise: Principal | ICD-10-CM

## 2015-11-08 DIAGNOSIS — R7881 Bacteremia: Secondary | ICD-10-CM

## 2015-11-08 DIAGNOSIS — E871 Hypo-osmolality and hyponatremia: Secondary | ICD-10-CM | POA: Diagnosis present

## 2015-11-08 DIAGNOSIS — G9340 Encephalopathy, unspecified: Secondary | ICD-10-CM | POA: Diagnosis not present

## 2015-11-08 DIAGNOSIS — N400 Enlarged prostate without lower urinary tract symptoms: Secondary | ICD-10-CM | POA: Diagnosis not present

## 2015-11-08 DIAGNOSIS — Z8673 Personal history of transient ischemic attack (TIA), and cerebral infarction without residual deficits: Secondary | ICD-10-CM

## 2015-11-08 DIAGNOSIS — H60399 Other infective otitis externa, unspecified ear: Secondary | ICD-10-CM

## 2015-11-08 MED ORDER — BENZONATATE 100 MG PO CAPS
100.0000 mg | ORAL_CAPSULE | Freq: Three times a day (TID) | ORAL | Status: DC
  Administered 2015-11-09 – 2015-11-14 (×15): 100 mg via ORAL
  Filled 2015-11-08 (×16): qty 1

## 2015-11-08 MED ORDER — PROCHLORPERAZINE MALEATE 5 MG PO TABS: 5.0000 mg | ORAL_TABLET | Freq: Four times a day (QID) | ORAL | Status: DC | PRN

## 2015-11-08 MED ORDER — FLEET ENEMA 7-19 GM/118ML RE ENEM: 1.0000 | ENEMA | Freq: Once | RECTAL | Status: DC | PRN

## 2015-11-08 MED ORDER — GUAIFENESIN-DM 100-10 MG/5ML PO SYRP: 5.0000 mL | ORAL_SOLUTION | Freq: Four times a day (QID) | ORAL | Status: DC | PRN

## 2015-11-08 MED ORDER — GUAIFENESIN-DM 100-10 MG/5ML PO SYRP: 10.0000 mL | ORAL_SOLUTION | ORAL | Status: DC | PRN

## 2015-11-08 MED ORDER — PENICILLIN G POTASSIUM 5000000 UNITS IJ SOLR: 4.0000 10*6.[IU] | INTRAVENOUS | Status: DC

## 2015-11-08 MED ORDER — ACETAMINOPHEN 325 MG PO TABS: 325.0000 mg | ORAL_TABLET | ORAL | Status: DC | PRN

## 2015-11-08 MED ORDER — SODIUM CHLORIDE 0.9% FLUSH
10.0000 mL | Freq: Two times a day (BID) | INTRAVENOUS | Status: DC
  Administered 2015-11-09 – 2015-11-13 (×3): 10 mL

## 2015-11-08 MED ORDER — GUAIFENESIN-DM 100-10 MG/5ML PO SYRP: 5.0000 mL | ORAL_SOLUTION | ORAL | Status: DC | PRN

## 2015-11-08 MED ORDER — TRAZODONE HCL 50 MG PO TABS
25.0000 mg | ORAL_TABLET | Freq: Every evening | ORAL | Status: DC | PRN
  Administered 2015-11-08 – 2015-11-13 (×5): 50 mg via ORAL
  Filled 2015-11-08 (×5): qty 1

## 2015-11-08 MED ORDER — ENOXAPARIN SODIUM 40 MG/0.4ML ~~LOC~~ SOLN
40.0000 mg | SUBCUTANEOUS | Status: DC
  Administered 2015-11-08 – 2015-11-12 (×5): 40 mg via SUBCUTANEOUS
  Filled 2015-11-08 (×6): qty 0.4

## 2015-11-08 MED ORDER — ALUM & MAG HYDROXIDE-SIMETH 200-200-20 MG/5ML PO SUSP: 30.0000 mL | ORAL | Status: DC | PRN

## 2015-11-08 MED ORDER — PENICILLIN G POTASSIUM 5000000 UNITS IJ SOLR
4.0000 10*6.[IU] | INTRAVENOUS | Status: AC
  Administered 2015-11-08 – 2015-11-13 (×31): 4 10*6.[IU] via INTRAVENOUS
  Filled 2015-11-08 (×37): qty 4

## 2015-11-08 MED ORDER — METOPROLOL TARTRATE 25 MG PO TABS
25.0000 mg | ORAL_TABLET | Freq: Two times a day (BID) | ORAL | Status: DC
  Administered 2015-11-09 – 2015-11-14 (×11): 25 mg via ORAL
  Filled 2015-11-08 (×12): qty 1

## 2015-11-08 MED ORDER — DIPHENHYDRAMINE HCL 12.5 MG/5ML PO ELIX: 12.5000 mg | ORAL_SOLUTION | Freq: Four times a day (QID) | ORAL | Status: DC | PRN

## 2015-11-08 MED ORDER — SODIUM CHLORIDE 0.9% FLUSH
10.0000 mL | INTRAVENOUS | Status: DC | PRN
Start: 2015-11-08 — End: 2015-11-14

## 2015-11-08 MED ORDER — FINASTERIDE 5 MG PO TABS
5.0000 mg | ORAL_TABLET | Freq: Every day | ORAL | Status: DC
  Administered 2015-11-09 – 2015-11-14 (×6): 5 mg via ORAL
  Filled 2015-11-08 (×6): qty 1

## 2015-11-08 MED ORDER — ROSUVASTATIN CALCIUM 10 MG PO TABS
10.0000 mg | ORAL_TABLET | Freq: Every day | ORAL | Status: DC
  Administered 2015-11-09 – 2015-11-14 (×6): 10 mg via ORAL
  Filled 2015-11-08 (×6): qty 1

## 2015-11-08 MED ORDER — FLUCONAZOLE 100 MG PO TABS
200.0000 mg | ORAL_TABLET | Freq: Once | ORAL | Status: AC
  Administered 2015-11-08: 200 mg via ORAL
  Filled 2015-11-08: qty 2

## 2015-11-08 MED ORDER — PROCHLORPERAZINE EDISYLATE 5 MG/ML IJ SOLN: 5.0000 mg | Freq: Four times a day (QID) | INTRAMUSCULAR | Status: DC | PRN

## 2015-11-08 MED ORDER — PROCHLORPERAZINE 25 MG RE SUPP: 12.5000 mg | Freq: Four times a day (QID) | RECTAL | Status: DC | PRN

## 2015-11-08 MED ORDER — CIPROFLOXACIN-DEXAMETHASONE 0.3-0.1 % OT SUSP: 4.0000 [drp] | Freq: Two times a day (BID) | OTIC | 0 refills | Status: DC

## 2015-11-08 MED ORDER — ACETAMINOPHEN 325 MG PO TABS: 650.0000 mg | ORAL_TABLET | Freq: Four times a day (QID) | ORAL | Status: DC | PRN

## 2015-11-08 MED ORDER — SENNOSIDES-DOCUSATE SODIUM 8.6-50 MG PO TABS: 1.0000 | ORAL_TABLET | Freq: Every evening | ORAL | Status: DC | PRN

## 2015-11-08 MED ORDER — FINASTERIDE 5 MG PO TABS: 5.0000 mg | ORAL_TABLET | Freq: Every day | ORAL | Status: DC

## 2015-11-08 MED ORDER — METOPROLOL TARTRATE 25 MG PO TABS: 25.0000 mg | ORAL_TABLET | Freq: Two times a day (BID) | ORAL | Status: DC

## 2015-11-08 MED ORDER — ALBUTEROL SULFATE (2.5 MG/3ML) 0.083% IN NEBU
2.5000 mg | INHALATION_SOLUTION | RESPIRATORY_TRACT | Status: DC | PRN
  Filled 2015-11-08 (×2): qty 3

## 2015-11-08 MED ORDER — CIPROFLOXACIN-DEXAMETHASONE 0.3-0.1 % OT SUSP
4.0000 [drp] | Freq: Two times a day (BID) | OTIC | Status: AC
  Administered 2015-11-08 – 2015-11-09 (×3): 4 [drp] via OTIC
  Filled 2015-11-08: qty 7.5

## 2015-11-08 MED ORDER — BISACODYL 10 MG RE SUPP: 10.0000 mg | Freq: Every day | RECTAL | Status: DC | PRN

## 2015-11-08 MED ORDER — BENZONATATE 100 MG PO CAPS: 100.0000 mg | ORAL_CAPSULE | Freq: Three times a day (TID) | ORAL | Status: DC | PRN

## 2015-11-08 NOTE — Progress Notes (Signed)
Report called to nurse at [facility]

## 2015-11-08 NOTE — Progress Notes (Signed)
Ankit Lorie Phenix, MD Physician Signed Physical Medicine and Rehabilitation  Consult Note Date of Service: 11/07/2015 8:32 AM  Related encounter: ED to Hosp-Admission (Discharged) from 10/29/2015 in [place] All Collapse All   [] Hide copied text [] Hover for attribution information      Physical Medicine and Rehabilitation Consult   Reason for Consult: Encephalopathy in setting of sepsis Referring Physician: Dr. Halford Chessman.    HPI: [name] is a 60 y.o. 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 He was found to be septic due to group B strep agalactiae and was intubated due to agitation. 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 2 D echo done revealing EF 60-65% with mild AVR and questionable density anterior mitral valve with MVP. TEE negative for endocarditis, no thrombus or PFO. EEG showed diffuse cerebral dysfunction question due to hypoxic/ischemic injury, encephalopathy or propofol. He tolerated extubation on 10/31 and antibiotics narrowed to PCN G per ID input.   Dr. [name] felt that GBS bacteremia due to recent procedure or immunosuppression from occult malignancy. To continue IV antibiotics for 2 weeks total after last negative BC.  Neurology consulted due to ongoing issues with agitation and LP done showing evidence of meningitis. 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 Drainage from ear due to otitis externa treated with ciprodex and improving.  Dr.  [name] has followed up with patient and recommends partial nephrectomy --to follow up with Dr. [name] after holidays. Mentation improving and PT/OT evaluations done showing generalized weakness with cognitive deficits. CIR recommended for follow up therapy.  Mental status improving and    Review of Systems  Eyes: Negative for blurred vision and double vision.  Respiratory: Negative for cough and hemoptysis.   Cardiovascular: Negative for chest pain and palpitations.  Neurological: Positive for speech change and focal weakness. Negative for sensory change.       HOH  All other systems reviewed and are negative.         Past Medical History:  Diagnosis Date  . CVA (cerebral infarction)    hemmorhagic  CVA  . Dyslipidemia   . Endocarditis    MV Vegitation  . Mitral regurgitation   . Myocardial infarction   . Stroke [facility])          Past Surgical History:  Procedure Laterality Date  . NO PAST SURGERIES            Family History  Problem Relation Age of Onset  . Heart disease Mother     mom RF as a child (died of heart problem in  71 )    Social History:  Married--wife works but has a flexible schedule. Disabled Chief Executive Officer. He reports that he has never smoked. He has never used smokeless tobacco.  He drinks 1/2 bottle of wine daily? He reports that he does not drink use drugs.    Allergies: No Known Allergies          Medications Prior to Admission  Medication Sig Dispense Refill  . buPROPion (WELLBUTRIN XL) 150 MG  24 hr tablet Take 300 mg by mouth daily.    . Cholecalciferol (VITAMIN D PO) Take 1 tablet by mouth daily.    . diphenhydramine-acetaminophen (TYLENOL PM) 25-500 MG TABS tablet Take 2 tablets by mouth at bedtime.    . Multiple Vitamin (MULTIVITAMIN WITH MINERALS) TABS tablet Take 1 tablet by mouth daily.    . rosuvastatin (CRESTOR) 10 MG tablet Take 1 tablet (10 mg total) by mouth daily. 90 tablet 3    Home: Home  Living Family/patient expects to be discharged to:: Private residence Living Arrangements: Spouse/significant other Available Help at Discharge: Family, Available 24 hours/day Type of Home: House Home Access: Stairs to enter Technical brewer of Steps: 3 Entrance Stairs-Rails: None Home Layout: Two level Alternate Level Stairs-Number of Steps: 15 Alternate Level Stairs-Rails: Can reach both Bathroom Shower/Tub: Multimedia programmer: Handicapped height Home Equipment: None  Functional History: Prior Function Level of Independence: Independent Comments: just finished a bike tour of [place] Functional Status:  Mobility: Bed Mobility Overal bed mobility: Needs Assistance Bed Mobility: Supine to Sit Supine to sit: Min guard General bed mobility comments: Min guard for safety, some impulsivitity noted Transfers Overall transfer level: Needs assistance Equipment used: Rolling walker (2 wheeled) Transfers: Sit to/from Stand Sit to Stand: Min assist General transfer comment: min assist for stability with elevation to upright positioning Ambulation/Gait Ambulation/Gait assistance: Mod assist Ambulation Distance (Feet): 38 Feet Assistive device: Rolling walker (2 wheeled) Gait Pattern/deviations: Step-through pattern, Decreased stride length, Ataxic, Staggering right, Narrow base of support General Gait Details: patient very unsteady with ambulation, difficulty sequencing step length and gait. Hands on physical assist required for stability and increased assist for manual maneuvering of RW Gait velocity: decreased Gait velocity interpretation: Below normal speed for age/gender    ADL: ADL Overall ADL's : Needs assistance/impaired Eating/Feeding: Supervision/ safety, Sitting Eating/Feeding Details (indicate cue type and reason): increased spillage with spoon, instructed wife to allow pt to self feed Grooming: Wash/dry hands, Wash/dry face, Sitting, Minimal  assistance Grooming Details (indicate cue type and reason): decreased thoroughness Upper Body Bathing: Moderate assistance, Sitting Lower Body Bathing: Moderate assistance, Sit to/from stand Upper Body Dressing : Moderate assistance, Sitting Lower Body Dressing: Moderate assistance, Sit to/from stand Toileting- Water quality scientist and Hygiene: Maximal assistance, Sit to/from stand Functional mobility during ADLs: Rolling walker, Moderate assistance General ADL Comments: Pt in urine soaked bed without awareness.  Cognition: Cognition Overall Cognitive Status: Impaired/Different from baseline Orientation Level: Oriented to person, Oriented to place, Oriented to time, Oriented to situation Cognition Arousal/Alertness: Awake/alert Behavior During Therapy: Impulsive Overall Cognitive Status: Impaired/Different from baseline Area of Impairment: Orientation, Attention, Following commands, Safety/judgement, Awareness, Problem solving Orientation Level: Disoriented to, Situation Current Attention Level: Selective Following Commands: Follows one step commands consistently Safety/Judgement: Decreased awareness of safety, Decreased awareness of deficits Awareness: Emergent Problem Solving: Slow processing, Difficulty sequencing, Requires verbal cues, Requires tactile cues  Blood pressure 127/70, pulse 85, temperature 97.4 F (36.3 C), temperature source Oral, resp. rate 18, height 5\' 8"  (1.727 m), weight 82.7 kg (182 lb 4.8 oz), SpO2 96 %. Physical Exam  Nursing note and vitals reviewed. Constitutional: He is oriented to person, place, and time. He appears well-developed and well-nourished.  HENT:  Head: Normocephalic and atraumatic.  Mouth/Throat: Oropharynx is clear and moist.  Right ear with crusted drainage  Eyes: Conjunctivae and EOM are normal. Pupils are equal, round, and reactive to light.  Neck: Normal range of motion. Neck supple.  Cardiovascular: Normal rate and regular  rhythm.   Respiratory: Effort normal and breath sounds normal. No stridor. No respiratory distress. He has no wheezes.  GI: Soft. Bowel sounds are normal. He exhibits no distension. There is no tenderness.  Musculoskeletal: He exhibits no edema or tenderness.  Neurological: He is alert and oriented to person, place, and time.  HOH.  Mild facial droop with dysarthria.  Perseverative tendencies with poor safety awareness. Impulsive with question of apraxia but able to follow simpler one step motor commands.  Sensation intact to light touch Motor: RUE/RLE: 4+/5 proximal to distal LUE/LLE: 4-4+/5  Skin: Skin is warm and dry.  Psychiatric: He has a normal mood and affect. His speech is normal. He expresses impulsivity and inappropriate judgment. He is inattentive.    Lab Results Last 24 Hours  No results found for this or any previous visit (from the past 24 hour(s)).   Imaging Results (Last 48 hours)  No results found.    Assessment/Plan: Diagnosis: Encephalopathy  Labs and images independently reviewed.  Records reviewed and summated above.  1. Does the need for close, 24 hr/day medical supervision in concert with the patient's rehab needs make it unreasonable for this patient to be served in a less intensive setting? Yes  2. Co-Morbidities requiring supervision/potential complications: MV endocarditis and embolic stroke [id number] (cont meds, mild left sided weakness), reactive emotional dyscontrol syndrome , BPH s/p TURP on 10/29/15, confusion, agitation, renal malignancy (plan for outpt follow up), AVR, MVP (Monitor in accordance with increased physical activity and avoid UE resistance excercises), meningitis (cont meds), ventriculitis, ETOH abuse (counsel), leukocytosis (cont to monitor for signs and symptoms of infection, further workup if indicated), hyponatremia (cont to monitor, treat if necessary), hypokalemia (continue to monitor and replete as necessary), dysphagia (SLP, advance  diet as tolerated) 3. Due to bladder management, safety, skin/wound care, disease management and patient education, does the patient require 24 hr/day rehab nursing? Yes 4. Does the patient require coordinated care of a physician, rehab nurse, PT (1-2 hrs/day, 5 days/week), OT (1-2 hrs/day, 5 days/week) and SLP (1-2 hrs/day, 5 days/week) to address physical and functional deficits in the context of the above medical diagnosis(es)? Yes Addressing deficits in the following areas: balance, endurance, locomotion, strength, transferring, bathing, dressing, toileting, cognition, swallowing and psychosocial support 5. Can the patient actively participate in an intensive therapy program of at least 3 hrs of therapy per day at least 5 days per week? Yes 6. The potential for patient to make measurable gains while on inpatient rehab is good 7. Anticipated functional outcomes upon discharge from inpatient rehab are modified independent  with PT, modified independent with OT, independent and modified independent with SLP. 8. Estimated rehab length of stay to reach the above functional goals is: 5-9 days. 9. Does the patient have adequate social supports and living environment to accommodate these discharge functional goals? Yes 10. Anticipated D/C setting: Home 11. Anticipated post D/C treatments: HH therapy and Home excercise program 12. Overall Rehab/Functional Prognosis: good  RECOMMENDATIONS: This patient's condition is appropriate for continued rehabilitative care in the following setting: CIR once medically stable Patient has agreed to participate in recommended program. Yes Note that insurance prior authorization may be required for reimbursement for recommended care.  Comment: Rehab Admissions Coordinator to follow up.  Delice Lesch, MD, [facility] 11/07/2015    Revision History                        Routing History

## 2015-11-08 NOTE — Progress Notes (Signed)
Maurice Gong, RN Rehab Admission Coordinator Signed Physical Medicine and Rehabilitation  PMR Pre-admission Date of Service: 11/08/2015 12:16 PM  Related encounter: ED to Hosp-Admission (Discharged) from 10/29/2015 in [facility]       [] Hide copied text PMR Admission Coordinator Pre-Admission Assessment  Patient: [name] is an 60 y.o., male MRN: [id number] DOB: [birth year] Height: 5\' 8"  (172.7 cm) Weight: 85.1 kg (187 lb 9.6 oz)                                                                                                                                                  Insurance Information HMO:     PPO: yes     PCP:      IPA:      80/20:      OTHER:  PRIMARY: [employer] of [place]      Policy#: [id number]      Subscriber: pt CM Name: [name]      Phone#: [phone] ext [phone]     Fax#: [phone] Pre-Cert#: [id number] approved until 11/13 when updates are due      Employer: retired Benefits:  Phone #: [phone]     Name: 11/07/2015 Eff. Date: 01/02/15     Deduct: $5500      Out of Pocket Max: [phone]      Life Max: none CIR: 80%      SNF: 80% Outpatient: 80%     Co-Pay: 30 visits each PT, OT and SLP Home Health: 80%      Co-Pay: visits per medical necessity DME: 80%     Co-Pay: 20% Providers: in network  SECONDARY: none       Medicaid Application Date:       Case Manager:  Disability Application Date:       Case Worker:   Emergency Tax adviser Information    Name Relation Home Work Mobile   [name] Spouse   [phone]   No name specified         Current Medical History  Patient Admitting Diagnosis: encephalopathy due to sepsis  History of Present Illness:  HPI: Maurice Sperlingis a 60 y.o.malewith 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 He was found to be septic due to group B strep agalactiae and was intubated due to agitation. 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 2 D echo done revealing EF 60-65% with mild AVR and questionable density anterior mitral valve with MVP. TEE negative for endocarditis, no thrombus or PFO. EEG showed diffuse cerebral dysfunction  question due to hypoxic/ischemic injury, encephalopathy or propofol. He tolerated extubation on 10/31 and antibiotics narrowed to PCN G per ID input.   Dr. [name] felt that GBS bacteremia due to recent procedure or immunosuppression from occult malignancy. To continue IV antibiotics for 2 weeks total after last negative BC. Neurology consulted due to ongoing issues with agitation and LP done showing evidence of meningitis. MRI brain done showing areas of layering diffusion restriction concerning for ventriculitis and Abnormal DWI intensity in both frontal lobes question sequelae of remote hemorrhage or leptomeningeal abnormality. Drainage from ear due to otitis externa treated with ciprodex and improving. Dr. [name] has followed up with patient and recommends partial nephrectomy --to follow up with Dr. [name] after holidays. Mentation improving and PT/OT evaluations done showing generalized weakness with cognitive deficits.   Past Medical History  Past Medical History:  Diagnosis Date  . Bacteremia 110/2017  . CVA (cerebral infarction)    hemmorhagic  CVA  . Dyslipidemia   . Endocarditis    MV Vegitation  . Mitral regurgitation   . Myocardial infarction   . Stroke [facility])     Family History  family history includes Heart disease in his mother.  Prior Rehab/Hospitalizations:  Has the patient had major surgery during 100 days prior to admission? Yes  Current Medications   Current  Facility-Administered Medications:  .  0.9 %  sodium chloride infusion, 250 mL, Intravenous, PRN, Luz Brazen, MD, Last Rate: 10 mL/hr at 11/02/15 2100, 250 mL at 11/02/15 2100 .  acetaminophen (TYLENOL) tablet 650 mg, 650 mg, Oral, Q6H PRN, Rush Farmer, MD, 650 mg at 11/06/15 1003 .  benzonatate (TESSALON) capsule 100 mg, 100 mg, Oral, TID, Ripudeep K Rai, MD, 100 mg at 11/08/15 1119 .  ciprofloxacin-dexamethasone (CIPRODEX) 0.3-0.1 % otic suspension 4 drop, 4 drop, Right Ear, BID, Thayer Headings, MD, 4 drop at 11/08/15 1121 .  enoxaparin (LOVENOX) injection 40 mg, 40 mg, Subcutaneous, Q24H, Ihor Austin, RPH, 40 mg at 11/07/15 1234 .  finasteride (PROSCAR) tablet 5 mg, 5 mg, Oral, Daily, Carolan Clines, MD, 5 mg at 11/08/15 1119 .  guaiFENesin-dextromethorphan (ROBITUSSIN DM) 100-10 MG/5ML syrup 5 mL, 5 mL, Oral, Q4H PRN, Ripudeep K Rai, MD .  hydrALAZINE (APRESOLINE) injection 10-20 mg, 10-20 mg, Intravenous, Q6H PRN, Collene Gobble, MD .  MEDLINE mouth rinse, 15 mL, Mouth Rinse, BID, Praveen Mannam, MD, 15 mL at 11/08/15 1000 .  metoprolol tartrate (LOPRESSOR) tablet 25 mg, 25 mg, Oral, BID, Chesley Mires, MD, 25 mg at 11/08/15 1119 .  penicillin G potassium 4 Million Units in dextrose 5 % 250 mL IVPB, 4 Million Units, Intravenous, Q4H, Alexa Angela Burke, MD, 4 Million Units at 11/08/15 1139 .  rosuvastatin (CRESTOR) tablet 10 mg, 10 mg, Oral, Daily, Luz Brazen, MD, 10 mg at 11/08/15 1119 .  sodium chloride flush (NS) 0.9 % injection 10-40 mL, 10-40 mL, Intracatheter, PRN, Caren Griffins, MD, 10 mL at 11/06/15 2014  Patients Current Diet: DIET DYS 3 Room service appropriate? Yes; Fluid consistency: Thin  Precautions / Restrictions Precautions Precautions: Fall Precaution Comments: incontinent, HOH Restrictions Weight Bearing Restrictions: No   Has the patient had 2 or more falls or a fall with injury in the past year?No  Prior Activity Level Community (5-7x/wk):  Independent and driving pta. Active. disabled since CVA. 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 Walks his dog . 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 None of the above  Prior Functional Level Prior Function Level of Independence: Independent Comments: just finished a bike tour of [place]  Self Care: Did the patient need help bathing, dressing, using the toilet or eating?  Independent  Indoor Mobility: Did the patient need assistance with walking from room to room (with or without device)? Independent  Stairs: Did the patient need assistance with internal or external stairs (with or without device)? Independent  Functional Cognition: Did the patient need help planning regular tasks such as shopping or remembering to take medications? Independent  Current Functional Level Cognition Arousal/Alertness: Awake/alert Overall Cognitive Status: Impaired/Different from baseline Current Attention Level: Selective Orientation Level: Oriented to person, Oriented to place, Oriented to situation, Disoriented to time Following Commands: Follows one step commands consistently Safety/Judgement: Decreased awareness of safety, Decreased awareness of deficits General Comments: Pt requires cues for problem solving.  Demonstrates impaired memory  Attention: Sustained Sustained Attention: Impaired Sustained Attention Impairment: Verbal complex, Functional complex Memory: Appears intact Awareness: Impaired Awareness Impairment: Intellectual impairment Problem Solving: Impaired Problem Solving Impairment: Verbal complex, Functional complex Executive Function: Self Monitoring Self Monitoring: Impaired Self Monitoring Impairment: Verbal complex, Functional complex Behaviors: Impulsive Safety/Judgment: Appears intact     Extremity Assessment (includes Sensation/Coordination) Upper Extremity Assessment: RUE deficits/detail, LUE deficits/detail RUE Deficits / Details: generalized weakness, incoordination LUE Deficits / Details: generalized weakness, incoordination  Lower Extremity Assessment: Generalized weakness   ADLs Overall ADL's : Needs assistance/impaired Eating/Feeding: Supervision/ safety, Set up Eating/Feeding Details (indicate cue type and reason): increased spillage with spoon, instructed wife to allow pt to self feed Grooming: Wash/dry hands, Wash/dry face, Oral care, Min guard, Standing Grooming Details (indicate cue type and reason): decreased throughness, and cues to locate all needed items  Upper Body Bathing: Minimal assitance, Sitting Lower Body Bathing: Moderate assistance, Sit to/from stand Lower Body Bathing Details (indicate cue type and reason): decreased thoroughness and assist for balance  Upper Body Dressing : Minimal assistance, Moderate assistance, Sitting Lower Body Dressing: Minimal assistance, Sit to/from stand Lower Body Dressing Details (indicate cue type and reason): able to don/doff socks  Toilet Transfer: Moderate assistance, Ambulation, RW, Comfort height toilet Toilet Transfer Details (indicate cue type and reason): mod A due to difficulty maneuvering in small spaces.  Freqently runs into items, loses balance and requires assist to prevent fall  Toileting- Clothing Manipulation and Hygiene: Minimal assistance, Sit to/from stand Toileting - Clothing Manipulation Details (indicate cue type and reason): assist for thouroughness  Functional mobility during ADLs: Minimal assistance, Moderate assistance, Rolling walker General ADL Comments: Pt with poor awareness of environment.  He frequently runs into obstacles in his environment requiring mod A to prevent fall    Mobility Overal bed mobility: Needs Assistance Bed Mobility: Supine to Sit, Sit to Supine Supine to sit: Min  guard Sit to supine: Min guard General bed mobility comments: EOB on arrival with nursing   Transfers Overall transfer level: Needs assistance Equipment used: Rolling walker (2 wheeled) Transfers: Sit to/from Stand, [employer] Transfers Sit to Stand: Min guard Stand pivot transfers: Min assist General transfer comment: min guard for safety and min a for balance    Ambulation / Gait / Stairs / Wheelchair Mobility Ambulation/Gait Ambulation/Gait assistance: Museum/gallery curator (Feet): 350 Feet Assistive device: Rolling walker (2 wheeled) Gait Pattern/deviations: Step-through pattern, Decreased stride length General Gait Details: pt with variation of too far anterior/too posterior to  RW with cues throughout for RW use, safety and obstacles, pt running into obstacles x 4 during gait despite cues. Pt denied further distance due to fatigue Gait velocity: decreased Gait velocity interpretation: Below normal speed for age/gender   Posture / Balance Balance Overall balance assessment: Needs assistance Sitting-balance support: Feet supported Sitting balance-Leahy Scale: Good Standing balance support: Single extremity supported, During functional activity Standing balance-Leahy Scale: Fair Standing balance comment: heavy reliance on RW for UE support, posterior lean against bed for stabilization. Decreased righting reactions High level balance activites: Direction changes, Turns High Level Balance Comments: patient with difficulty making turns using RW, loss of balance noted x3, moderate assist to stabilize   Special needs/care consideration BiPAP/CPAP n/a CPM  N/a Continuous Drip IV  N/a Dialysis  N/a Life Vest  N/a Oxygen  N/a Special Bed  N/a Trach Size  N/a Wound Vac (area)  N/a Skin intact Bowel mgmt: incontinent LBM 11/7 Bladder mgmt:incontinent Diabetic mgmt  N/a HOH normally at baseline in left ear. 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 which affects his ability to cognitively follow  conversation   Previous Home Environment Living Arrangements: Spouse/significant other (second wife, not his wife Rod Holler, who was with him during init)  Lives With: Spouse Available Help at Discharge: Family, Available PRN/intermittently Type of Home: House Home Layout: Two level Alternate Level Stairs-Rails: Can reach both Alternate Level Stairs-Number of Steps: 15 Home Access: Stairs to enter Entrance Stairs-Rails: None Entrance Stairs-Number of Steps: 3 Bathroom Shower/Tub: Multimedia programmer: Handicapped height Bathroom Accessibility: Yes How Accessible: Accessible via walker [place]: No  Discharge Living Setting Plans for Discharge Living Setting: Patient's home, Lives with (comment) (wife, [name]. Married for three years) Type of Home at Discharge: House Discharge Home Layout: Two level Alternate Level Stairs-Rails: Right, Left, Can reach both Alternate Level Stairs-Number of Steps: 15 Discharge Home Access: Stairs to enter Entrance Stairs-Rails: None Entrance Stairs-Number of Steps: 3 Discharge Bathroom Shower/Tub: Walk-in shower Discharge Bathroom Toilet: Handicapped height Discharge Bathroom Accessibility: Yes How Accessible: Accessible via walker Does the patient have any problems obtaining your medications?: No  Social/Family/Support Systems Patient Roles: Spouse, Parent (3 adult children, 2 daughters and son) Sport and exercise psychologist Information: Statistician, wife Anticipated Caregiver: wife Anticipated Ambulance person Information: see above Ability/Limitations of Caregiver: wife is a Cabin crew so works with flexible schedule Caregiver Availability: Intermittent Discharge Plan Discussed with Primary Caregiver: Yes Is Caregiver In Agreement with Plan?: Yes Does Caregiver/Family have Issues with Lodging/Transportation while Pt is in Rehab?: No  Goals/Additional Needs Patient/Family Goal for Rehab: Mod I with PT, OT, and SLP Expected length of stay:  ELOS 5- 9 days Pt/Family Agrees to Admission and willing to participate: Yes Program Orientation Provided & Reviewed with Pt/Caregiver Including Roles  & Responsibilities: Yes  Decrease burden of Care through IP rehab admission: n/a  Possible need for SNF placement upon discharge: n/c  Patient Condition: This patient's condition remains as documented in the consult dated 11/07/2015, in which the Rehabilitation Physician determined and documented that the patient's condition is appropriate for intensive rehabilitative care in an inpatient rehabilitation facility. Will admit to inpatient rehab today.   Preadmission Screen Completed By:  Cleatrice Burke, 11/08/2015 12:16 PM ______________________________________________________________________   Discussed status with Dr. Naaman Plummer on 11/08/2015 at  1228 and received telephone approval for admission today.  Admission Coordinator:  Cleatrice Burke, time 1228 Date 11/08/2015       Cosigned by: Meredith Staggers, MD at 11/08/2015 1:24 PM  Revision History

## 2015-11-08 NOTE — Interval H&P Note (Signed)
[name] was admitted today to Inpatient Rehabilitation with the diagnosis of meningitis, debility.  The patient's history has been reviewed, patient examined, and there is no change in status.  Patient continues to be appropriate for intensive inpatient rehabilitation.  I have reviewed the patient's chart and labs.  Questions were answered to the patient's satisfaction. The PAPE has been reviewed and assessment remains appropriate.  [name] T 11/08/2015, 7:12 PM

## 2015-11-08 NOTE — Discharge Summary (Signed)
Physician Discharge Summary  [name] [id number] DOB: 03-Feb-1955 DOA: 10/29/2015  PCP: Henrine Screws, MD  Admit date: 10/29/2015 Discharge date: 11/08/2015  Admitted From: home Disposition:  Inpatient rehab  Recommendations for Outpatient Follow-up:  1. Follow up with Infectious diseases in 2-3 weeks 2. Continue Ciprodex for 1  More day end date 11/8 3. Continue Penicillin per ID until 11/12. PICC line care per nursing staff, pull PICC at the end of antibiotic treatment   Discharge Condition: stable CODE STATUS: Full Diet recommendation: regular  HPI: 60 yo male presented with altered mental status, incontinent of bowel/bladder, fever, and respiratory failure. He had cystoscopy and transrectal u/s for BPH 2 days prior to admission and given cipro for prophylaxis. 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 Noted to have Rt ear infection. Found to have Group B Strep bacteremia and meningitis.  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 Henreitta Leber input, TEE negative for bacteremia, on penicillin, continue. PICC line placed 11/4, Patient will need 2 weeks of IV penicillin based on negative blood cultures on 10/30, plan to continue through 11/13/2015, surveillance cultures remained negative, final Acute encephalopathy 2nd to meningitis >> much improved -  monitor mental status, now AxOx4, d/c to inpatient rehab Rt Otitis Externa - Continue Ciprodex through 11/8 Meningitis - Abx per ID, as above Right renal mass - CT scan abdomen and pelvis on 10/31 showed right kidney mass up to 53 mm, suspicious for renal malignancy. Urology is following, appreciate input, plan for close outpatient follow up after rehab Prior CVAs - No new neurologic findings, patient recovered well after these CVAs in the past Hx of BPH - f/u with urology Elevated blood pressure, sinus tachycardia - Now on metoprolol, improved Hx of HLD - continueoutpt crestor Hx of MVP - added lopressor 11/03   Discharge Instructions     Medication List    TAKE these medications   buPROPion 150 MG 24 hr tablet Commonly known as:  WELLBUTRIN XL Take 300 mg by mouth daily.   ciprofloxacin-dexamethasone otic suspension Commonly known as:  CIPRODEX Place 4 drops into the right ear 2 (two) times daily.   diphenhydramine-acetaminophen 25-500 MG Tabs tablet Commonly known as:  TYLENOL PM Take 2 tablets by mouth at bedtime.   finasteride 5 MG tablet Commonly known as:  PROSCAR Take 1 tablet (5 mg total) by mouth daily. Start taking on:  11/09/2015   metoprolol tartrate 25 MG tablet Commonly known as:  LOPRESSOR Take 1 tablet (25 mg total) by mouth 2 (two) times daily.   multivitamin with minerals Tabs tablet Take 1 tablet by mouth daily.   penicillin G potassium 4 Million Units in dextrose 5 % 250 mL Inject 4 Million Units into the vein every 4 (four) hours. End date 11/12   rosuvastatin 10 MG tablet Commonly known as:  CRESTOR  Take 1 tablet (10 mg total) by mouth daily.   VITAMIN D PO Take 1 tablet by mouth daily.      Follow-up [facility] for [facility]. Schedule an appointment as soon as possible for a visit in 2 week(s).   Specialty:  Infectious Diseases Contact information: [place], [place] [id number] [place] [address] [phone]         No Known Allergies  Consultations:  ID  Procedures/Studies:  2D echo Study Conclusions - Left ventricle: Systolic function was normal. The estimated ejection fraction was in the range of 60% to 65%. Wall motion was normal; there were no regional wall motion abnormalities. - Aortic valve: There is moderate central regurgitation most probably sec to dilated aortic root. No vegetation seen. There   was moderate regurgitation. - Aorta: Aortic root is mildly dilated measuring 41 mm. - Ascending aorta: The ascending aorta was normal in size. - Descending aorta: The descending aorta was normal in size. - Mitral valve: Mitral valve leaflets are thickened with bi-leaflet prolapse posterior > anterior with moderate mitral regurgitation. There are 3 jets, no obvious vegetation and no perforation. - Left atrium: The atrium was dilated. No evidence of thrombus in the atrial cavity or appendage. No evidence of thrombus in the atrial cavity or appendage. - Right ventricle: Systolic function was normal. - Right atrium: No evidence of thrombus in the atrial cavity or appendage. - Atrial septum: No defect or patent foramen ovale was identified. - Tricuspid valve: There was mild regurgitation. - Pulmonic valve: No evidence of vegetation.   Ct Head Wo Contrast  Result Date: 10/29/2015 CLINICAL DATA:  60 year old male with confusion. Patient was found bathroom having seizure. EXAM: CT HEAD WITHOUT CONTRAST CT CERVICAL SPINE WITHOUT CONTRAST TECHNIQUE: Multidetector CT imaging of the head and cervical spine was performed following the standard protocol without intravenous contrast. Multiplanar CT image reconstructions of the cervical spine were also generated. COMPARISON:  Head CT dated 02/24/2008 and MRI dated 02/26/2008 FINDINGS: CT HEAD FINDINGS Brain: There is mild age-related atrophy and chronic microvascular ischemic  changes. An area of low-attenuation with volume loss involving the left temporal lobe likely represents encephalomalacia sequela of prior infarct. An area of low attenuation seen in the same area on the CT of 2010. There is no acute intracranial hemorrhage. No mass effect or midline shift. No extra-axial fluid collection. Vascular: No hyperdense vessel or unexpected calcification. Skull: Normal. Negative for fracture or focal lesion. Sinuses/Orbits: There is mild diffuse mucoperiosteal thickening of paranasal sinuses. No air-fluid levels. The mastoid air cells are clear. The globes and retro-orbital fat are preserved. Other: An endotracheal and enteric tubes are partially visualized. CT CERVICAL SPINE FINDINGS Alignment: No acute subluxation. Skull base and vertebrae: No acute fracture. There is incomplete bony fusion of the posterior ring of C1. Soft tissues and spinal canal: No prevertebral fluid or swelling. No visible canal hematoma. An endotracheal and enteric tube are partially visualized. Disc levels: Mild degenerative changes most prominent at C5-C6 and C6-C7. Upper chest: Negative. Other: None IMPRESSION: No acute intracranial hemorrhage. Old-appearing left temporal lobe infarct and encephalomalacia. No acute/traumatic cervical spine pathology. Electronically Signed   By: Anner Crete M.D.   On: 10/29/2015 06:25   Ct Cervical Spine Wo Contrast  Result Date: 10/29/2015 CLINICAL DATA:  60 year old male with confusion. Patient was found bathroom having seizure. EXAM: CT HEAD WITHOUT CONTRAST CT CERVICAL SPINE WITHOUT CONTRAST TECHNIQUE: Multidetector CT imaging of  the head and cervical spine was performed following the standard protocol without intravenous contrast. Multiplanar CT image reconstructions of the cervical spine were also generated. COMPARISON:  Head CT dated 02/24/2008 and MRI dated 02/26/2008 FINDINGS: CT HEAD FINDINGS Brain: There is mild age-related atrophy and chronic microvascular  ischemic changes. An area of low-attenuation with volume loss involving the left temporal lobe likely represents encephalomalacia sequela of prior infarct. An area of low attenuation seen in the same area on the CT of 2010. There is no acute intracranial hemorrhage. No mass effect or midline shift. No extra-axial fluid collection. Vascular: No hyperdense vessel or unexpected calcification. Skull: Normal. Negative for fracture or focal lesion. Sinuses/Orbits: There is mild diffuse mucoperiosteal thickening of paranasal sinuses. No air-fluid levels. The mastoid air cells are clear. The globes and retro-orbital fat are preserved. Other: An endotracheal and enteric tubes are partially visualized. CT CERVICAL SPINE FINDINGS Alignment: No acute subluxation. Skull base and vertebrae: No acute fracture. There is incomplete bony fusion of the posterior ring of C1. Soft tissues and spinal canal: No prevertebral fluid or swelling. No visible canal hematoma. An endotracheal and enteric tube are partially visualized. Disc levels: Mild degenerative changes most prominent at C5-C6 and C6-C7. Upper chest: Negative. Other: None IMPRESSION: No acute intracranial hemorrhage. Old-appearing left temporal lobe infarct and encephalomalacia. No acute/traumatic cervical spine pathology. Electronically Signed   By: Anner Crete M.D.   On: 10/29/2015 06:25   Mr Brain Wo Contrast  Result Date: 11/02/2015 CLINICAL DATA:  Encephalopathy. EXAM: MRI HEAD WITHOUT CONTRAST TECHNIQUE: Multiplanar, multiecho pulse sequences of the brain and surrounding structures were obtained without intravenous contrast. COMPARISON:  Head CT 10/29/2015, brain MRI 02/26/2008 FINDINGS: The examination is markedly degraded by motion. Brain: There are multiple areas within the in bilateral frontal lobes thin show hyperintensity on diffusion-weighted imaging without clear correlate on the ADC map. Additionally, there are areas of diffusion restriction within the  dependent portion of the occipital horns of both lateral ventricles. There is hemosiderin deposition related to remote hemorrhagic infarcts along the right external capsule and left frontal lobe. There is hyperintense T2 weighted signal within the left temporal lobe at the site of prior infarct. No mass lesion or midline shift. No hydrocephalus. Vascular: Major intracranial arterial and venous sinus flow voids are preserved. No evidence of chronic microhemorrhage or amyloid angiopathy. Skull and upper cervical spine: The visualized skull base, calvarium, upper cervical spine and extracranial soft tissues are normal. Sinuses/Orbits: Bilateral mastoid effusions and opacification of the right sphenoid sinus. Normal orbits. IMPRESSION: 1. Despite efforts by the technologist and patient, motion artifact is present on today's examination and could not be eliminated. This reduces the sensitivity and specificity of the study. 2. Areas of layering diffusion restriction within the dependent portions of the lateral ventricles may be secondary to chronic blood products related to prior hemorrhage, though in the context of infection, the appearance is concerning for ventriculitis. Recommend correlation with CSF analysis. Further MR imaging with intravenous contrast material might also be helpful. 3. Multiple areas of abnormal DWI intensity within both frontal lobes, which mostly appear to be within sulci. Given the lack of low ADC values at these sites, the findings may represent sequelae of remote hemorrhage or leptomeningeal abnormality (such as in the context of meningitis). 4. Remote infarcts of the right external capsule and left temporal lobe with associated hemosiderin deposition. Electronically Signed   By: Ulyses Jarred M.D.   On: 11/02/2015 15:57   Ct Abdomen Pelvis W  Contrast  Result Date: 11/01/2015 CLINICAL DATA:  60 y/o  M; bacteremia. EXAM: CT ABDOMEN AND PELVIS WITH CONTRAST TECHNIQUE: Multidetector CT  imaging of the abdomen and pelvis was performed using the standard protocol following bolus administration of intravenous contrast. CONTRAST:  129mL ISOVUE-300 IOPAMIDOL (ISOVUE-300) INJECTION 61% COMPARISON:  01/22/2006 CT of abdomen and pelvis. FINDINGS: Lower chest: Small bilateral pleural effusions and minor dependent atelectasis. Hepatobiliary: New nonspecific lucent well-circumscribed lesion dense liver segment 3 measuring 14 mm (series 2, image 20). Otherwise no focal liver lesion is identified. No intra or extrahepatic biliary ductal dilatation. Normal gallbladder. Pancreas: Unremarkable. No pancreatic ductal dilatation or surrounding inflammatory changes. Spleen: Normal in size without focal abnormality. Adrenals/Urinary Tract: Right kidney interpolar heterogeneous mass measuring 43 x 35 x 53 mm (AP x ML x CC), series 2, image 27 and series 5, image 105. Additionally in the right kidney upper pole there is a fluid attenuating lesion measuring 17 mm probably representing a cyst. No urinary stone disease or obstructive uropathy is identified. Bladder is unremarkable. Stomach/Bowel: Enteric tube with tip in the distal stomach noted. No obstructive or inflammatory changes of bowel identified. Nonvisualized appendix. Rectal tube. Vascular/Lymphatic: Aortic atherosclerosis. No enlarged abdominal or pelvic lymph nodes. Reproductive: Prostate is unremarkable. Other: No abdominal wall hernia or abnormality. No abdominopelvic ascites. Musculoskeletal: No acute or significant osseous findings. IMPRESSION: 1. 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 Further characterization with renal protocol CT or MRI is recommended. No lymphadenopathy is identified. 2. Nonspecific 14 mm lucent lesion in liver segment 3, new from 2008. Attention at follow-up is recommended. 3. Small bilateral pleural effusions and minor dependent atelectasis at the lung bases. 4. Otherwise no acute process  identified. These results will be called to the ordering clinician or representative by the Radiologist Assistant, and communication documented in the PACS or zVision Dashboard. Electronically Signed   By: Kristine Garbe M.D.   On: 11/01/2015 02:24   Dg Chest Port 1 View  Result Date: 11/03/2015 CLINICAL DATA:  60 year old male with acute respiratory failure. Subsequent encounter. EXAM: PORTABLE CHEST 1 VIEW COMPARISON:  11/02/2015 FINDINGS: Resolution of pulmonary vascular congestion. Elevated right hemidiaphragm with minimal right base subsegmental atelectasis. Cardiomegaly. Minimally tortuous aorta. IMPRESSION: Resolution of pulmonary vascular congestion. Cardiomegaly. Electronically Signed   By: Genia Del M.D.   On: 11/03/2015 07:09   Dg Chest Port 1 View  Result Date: 11/02/2015 CLINICAL DATA:  60 year old male with respiratory failure. Initial encounter. EXAM: PORTABLE CHEST 1 VIEW COMPARISON:  Portable chest radiographs 11/01/2015 and earlier. CT Abdomen and Pelvis 11/01/2015 FINDINGS: Portable AP semi upright view at 0459 hours. Extubated and enteric tube removed. Stable to mildly improved lung volumes. Improved left lung base ventilation with improved visualization of the left hemidiaphragm. Stable cardiomegaly and mediastinal contours. Increased pulmonary vascularity since yesterday without overt edema. No pneumothorax. The small pleural effusions seen by CT Abdomen and Pelvis yesterday are not evident. IMPRESSION: 1. Extubated and enteric tube removed. 2. Improved left lung base ventilation. Increased pulmonary vascularity without overt edema. Small bilateral pleural effusions seen by CT Abdomen and Pelvis yesterday are not evident. Electronically Signed   By: Genevie Ann M.D.   On: 11/02/2015 07:29   Dg Chest Port 1 View  Result Date: 11/01/2015 CLINICAL DATA:  Respiratory failure. EXAM: PORTABLE CHEST 1 VIEW COMPARISON:  10/31/2015. FINDINGS: Endotracheal tube and NG tube in  stable position. Heart size stable. Low lung volumes with basilar atelectasis. Left base infiltrate. Tiny  left pleural effusion cannot be excluded. IMPRESSION: 1. Lines and tubes in stable position. 2. Low lung volumes with basilar atelectasis. Left base infiltrate with small left pleural effusion noted. Electronically Signed   By: Marcello Moores  Register   On: 11/01/2015 07:01   Dg Chest Port 1 View  Result Date: 10/31/2015 CLINICAL DATA:  Respiratory failure, sepsis, mitral insufficiency. Intubated patient. EXAM: PORTABLE CHEST 1 VIEW COMPARISON:  Portable chest x-ray of October 30, 2015 FINDINGS: The patient is rotated toward the right. The lungs are reasonably well inflated. Mild interstitial prominence in the right lower lobe persists. There is no pneumothorax or pleural effusion. The heart is top-normal in size. The pulmonary vascularity is normal. The endotracheal tube tip lies approximately 6 cm above the carina. The esophagogastric tube tip in proximal port lie below the GE junction. IMPRESSION: Stable appearance of the chest allowing for rotation. Subsegmental atelectasis in the left lower lobe persists. The support tubes are in reasonable position. Electronically Signed   By: David  Martinique M.D.   On: 10/31/2015 07:04   Dg Chest Port 1 View  Result Date: 10/30/2015 CLINICAL DATA:  Respiratory failure EXAM: PORTABLE CHEST 1 VIEW COMPARISON:  Chest radiograph from one day prior. FINDINGS: Endotracheal tube tip is 3.1 cm above the carina. Enteric tube enters stomach with the tip not seen on this image. Stable cardiomediastinal silhouette with mild cardiomegaly. No pneumothorax. No pleural effusion. Slightly low lung volumes. No pulmonary edema. Mild left basilar atelectasis. IMPRESSION: 1. Support structures as described. 2. Slightly low lung volumes with mild left basilar atelectasis. 3. Stable mild cardiomegaly without pulmonary edema. Electronically Signed   By: Ilona Sorrel M.D.   On: 10/30/2015 08:13    Dg Chest Portable 1 View  Result Date: 10/29/2015 CLINICAL DATA:  60 year old male with intubation. EXAM: PORTABLE CHEST 1 VIEW COMPARISON:  Chest radiograph dated 10/29/2015 FINDINGS: There has been interval retraction of the endotracheal tube now approximately 5 cm above the carina. The enteric tube extends into the left hemi abdomen with the side-port in the left upper quadrant and tip beyond the inferior margin of the image. There is stable cardiomegaly with mild congestive changes. No focal consolidation, pleural effusion, or pneumothorax. A bone fragment noted along the inferior aspect of the left glenoid which appears chronic. No acute fracture identified. IMPRESSION: Interval retraction of the endotracheal tube with tip now approximately 5 cm above the carina. Stable cardiomegaly with minimal congestion. Electronically Signed   By: Anner Crete M.D.   On: 10/29/2015 05:41   Dg Chest Portable 1 View  Result Date: 10/29/2015 CLINICAL DATA:  Initial evaluation for endotracheal tube placement. EXAM: PORTABLE CHEST 1 VIEW COMPARISON:  Prior radiograph from earlier the same day. FINDINGS: Patient now intubated with an endotracheal tube in place. Tip positioned approximately 2.2 cm above the carina. Enteric tube courses in the the abdomen. Cardiomegaly stable.  Mediastinal silhouette within normal limits. Lungs hypoinflated. Mild vascular congestion without overt pulmonary edema. No focal infiltrates. No definite pleural effusion. No pneumothorax. No acute osseous abnormality. IMPRESSION: 1. Tip of the endotracheal tube approximately 2.2 cm above the carina. 2. Stable cardiomegaly with diffuse pulmonary vascular congestion. Electronically Signed   By: Jeannine Boga M.D.   On: 10/29/2015 05:33   Dg Chest Port 1 View  Result Date: 10/29/2015 CLINICAL DATA:  61 year old male with altered mental status EXAM: PORTABLE CHEST 1 VIEW COMPARISON:  Chest radiograph dated 03/06/2008 FINDINGS: There  is moderate cardiomegaly with mild vascular congestion. No focal consolidation,  pleural effusion, or pneumothorax. No acute osseous pathology noted. IMPRESSION: Cardiomegaly with mild congestive changes, new since the prior radiograph. Electronically Signed   By: Anner Crete M.D.   On: 10/29/2015 04:27   Dg Fluoro Guide Lumbar Puncture  Result Date: 11/02/2015 CLINICAL DATA:  Confusion, fever, altered mental status. EXAM: DIAGNOSTIC LUMBAR PUNCTURE UNDER FLUOROSCOPIC GUIDANCE FLUOROSCOPY TIME:  Fluoroscopy Time:  18 seconds Radiation Exposure Index (if provided by the fluoroscopic device): Number of Acquired Spot Images: 0 PROCEDURE: Informed consent was obtained from the patient prior to the procedure, including potential complications of headache, allergy, and pain. With the patient prone, the lower back was prepped with Betadine. 1% Lidocaine was used for local anesthesia. Lumbar puncture was performed at the L3-4 level using a 20 gauge needle with return of slightly yellowish CSF with an opening pressure of 39 cm water. 15 ml of CSF were obtained for laboratory studies. The patient tolerated the procedure well and there were no apparent complications. IMPRESSION: Technically successful lumbar puncture under fluoroscopic guidance. Fluid was slightly yellowish. Opening pressure significantly elevated at 39 cm H2O. Electronically Signed   By: Rolm Baptise M.D.   On: 11/02/2015 14:55     Subjective: - no chest pain, shortness of breath, no abdominal pain, nausea or vomiting.   Discharge Exam: Vitals:   11/07/15 1948 11/08/15 0419  BP: (!) 149/88 (!) 167/77  Pulse: 86 79  Resp: 18 16  Temp: 98.5 F (36.9 C) 98.2 F (36.8 C)   Vitals:   11/07/15 0818 11/07/15 1417 11/07/15 1948 11/08/15 0419  BP: 127/70 137/75 (!) 149/88 (!) 167/77  Pulse: 85 79 86 79  Resp:  20 18 16   Temp:  98 F (36.7 C) 98.5 F (36.9 C) 98.2 F (36.8 C)  TempSrc:  Oral Oral Oral  SpO2: 96% 99% 98% 99%  Weight:     85.1 kg (187 lb 9.6 oz)  Height:        General: Pt is alert, awake, not in acute distress Cardiovascular: RRR, S1/S2 +, no rubs, no gallops Respiratory: CTA bilaterally, no wheezing, no rhonchi Abdominal: Soft, NT, ND, bowel sounds + Extremities: no edema, no cyanosis    The results of significant diagnostics from this hospitalization (including imaging, microbiology, ancillary and laboratory) are listed below for reference.     Microbiology: Recent Results (from the past 240 hour(s))  Culture, respiratory (NON-Expectorated)     Status: None   Collection Time: 10/30/15 11:22 AM  Result Value Ref Range Status   Specimen Description TRACHEAL ASPIRATE  Final   Special Requests NONE  Final   Gram Stain   Final    ABUNDANT WBC PRESENT,BOTH PMN AND MONONUCLEAR RARE SQUAMOUS EPITHELIAL CELLS PRESENT RARE GRAM POSITIVE COCCI IN PAIRS RARE GRAM POSITIVE RODS    Culture RARE CANDIDA ALBICANS  Final   Report Status 11/01/2015 FINAL  Final  Culture, blood (Routine X 2) w Reflex to ID Panel     Status: None   Collection Time: 10/31/15  9:28 AM  Result Value Ref Range Status   Specimen Description BLOOD RIGHT ARM  Final   Special Requests IN PEDIATRIC BOTTLE 2CC  Final   Culture NO GROWTH 5 DAYS  Final   Report Status 11/05/2015 FINAL  Final  Culture, blood (Routine X 2) w Reflex to ID Panel     Status: None   Collection Time: 10/31/15  9:32 AM  Result Value Ref Range Status   Specimen Description BLOOD RIGHT HAND  Final  Special Requests IN PEDIATRIC BOTTLE 3CC  Final   Culture NO GROWTH 5 DAYS  Final   Report Status 11/05/2015 FINAL  Final  CSF culture     Status: None   Collection Time: 11/02/15  2:41 PM  Result Value Ref Range Status   Specimen Description CSF  Final   Special Requests NONE  Final   Gram Stain   Final    WBC PRESENT,BOTH PMN AND MONONUCLEAR NO ORGANISMS SEEN CYTOSPIN SMEAR    Culture NO GROWTH 3 DAYS  Final   Report Status 11/05/2015 FINAL  Final   Fungus Culture With Stain     Status: None (Preliminary result)   Collection Time: 11/02/15  2:41 PM  Result Value Ref Range Status   Fungus Stain Final report  Final    Comment: (NOTE) Performed At: [facility] [place], [place] [id number] Lindon Romp MD [id number]    Fungus (Mycology) Culture PENDING  Incomplete   Fungal Source CSF  Final  Fungus Culture Result     Status: None   Collection Time: 11/02/15  2:41 PM  Result Value Ref Range Status   Result 1 Comment  Final    Comment: (NOTE) KOH/Calcofluor preparation:  no fungus observed. Performed At: [facility] [place], [place] [id number] Lindon Romp MD [id number]      Labs: BNP (last 3 results) No results for input(s): BNP in the last 8760 hours. Basic Metabolic Panel:  Recent Labs Lab 11/02/15 0524 11/03/15 0247 11/04/15 0812 11/05/15 0235  NA 138 135 135 131*  K 3.9 3.4* 3.3* 4.0  CL 106 103 102 102  CO2 23 21* 23 19*  GLUCOSE 137* 96 122* 120*  BUN 18 17 13 16   CREATININE 0.77 0.83 0.90 0.90  CALCIUM 8.4* 8.5* 8.9 8.8*  MG 1.9 2.0  --   --   PHOS 2.5 2.6  --   --    Liver Function Tests: No results for input(s): AST, ALT, ALKPHOS, BILITOT, PROT, ALBUMIN in the last 168 hours. No results for input(s): LIPASE, AMYLASE in the last 168 hours. No results for input(s): AMMONIA in the last 168 hours. CBC:  Recent Labs Lab 11/02/15 0524 11/03/15 0247 11/06/15 0500  WBC 11.9* 12.3* 14.2*  HGB 12.6* 13.9 13.9  HCT 36.8* 40.2 40.6  MCV 87.2 85.4 86.0  PLT 223 257 431*   Cardiac Enzymes: No results for input(s): CKTOTAL, CKMB, CKMBINDEX, TROPONINI in the last 168 hours. BNP: Invalid input(s): POCBNP CBG:  Recent Labs Lab 11/02/15 0816 11/02/15 1109 11/02/15 1606 11/02/15 2003 11/03/15 0008  GLUCAP 87 92 114* 92 105*   D-Dimer No results for input(s): DDIMER in the last 72 hours. Hgb A1c No results for input(s): HGBA1C in the  last 72 hours. Lipid Profile No results for input(s): CHOL, HDL, LDLCALC, TRIG, CHOLHDL, LDLDIRECT in the last 72 hours. Thyroid function studies No results for input(s): TSH, T4TOTAL, T3FREE, THYROIDAB in the last 72 hours.  Invalid input(s): FREET3 Anemia work up No results for input(s): VITAMINB12, FOLATE, FERRITIN, TIBC, IRON, RETICCTPCT in the last 72 hours. Urinalysis    Component Value Date/Time   COLORURINE YELLOW 10/29/2015 [place] 10/29/2015 0355   LABSPEC 1.015 10/29/2015 0355   PHURINE 7.5 10/29/2015 0355   GLUCOSEU NEGATIVE 10/29/2015 0355   HGBUR TRACE (A) 10/29/2015 [place] NEGATIVE 10/29/2015 0355   [place] 10/29/2015 0355   PROTEINUR NEGATIVE 10/29/2015 0355   UROBILINOGEN  0.2 03/15/2008 1023   NITRITE NEGATIVE 10/29/2015 0355   LEUKOCYTESUR NEGATIVE 10/29/2015 0355   Sepsis Labs Invalid input(s): PROCALCITONIN,  WBC,  LACTICIDVEN Microbiology Recent Results (from the past 240 hour(s))  Culture, respiratory (NON-Expectorated)     Status: None   Collection Time: 10/30/15 11:22 AM  Result Value Ref Range Status   Specimen Description TRACHEAL ASPIRATE  Final   Special Requests NONE  Final   Gram Stain   Final    ABUNDANT WBC PRESENT,BOTH PMN AND MONONUCLEAR RARE SQUAMOUS EPITHELIAL CELLS PRESENT RARE GRAM POSITIVE COCCI IN PAIRS RARE GRAM POSITIVE RODS    Culture RARE CANDIDA ALBICANS  Final   Report Status 11/01/2015 FINAL  Final  Culture, blood (Routine X 2) w Reflex to ID Panel     Status: None   Collection Time: 10/31/15  9:28 AM  Result Value Ref Range Status   Specimen Description BLOOD RIGHT ARM  Final   Special Requests IN PEDIATRIC BOTTLE 2CC  Final   Culture NO GROWTH 5 DAYS  Final   Report Status 11/05/2015 FINAL  Final  Culture, blood (Routine X 2) w Reflex to ID Panel     Status: None   Collection Time: 10/31/15  9:32 AM  Result Value Ref Range Status   Specimen Description BLOOD RIGHT HAND  Final    Special Requests IN PEDIATRIC BOTTLE 3CC  Final   Culture NO GROWTH 5 DAYS  Final   Report Status 11/05/2015 FINAL  Final  CSF culture     Status: None   Collection Time: 11/02/15  2:41 PM  Result Value Ref Range Status   Specimen Description CSF  Final   Special Requests NONE  Final   Gram Stain   Final    WBC PRESENT,BOTH PMN AND MONONUCLEAR NO ORGANISMS SEEN CYTOSPIN SMEAR    Culture NO GROWTH 3 DAYS  Final   Report Status 11/05/2015 FINAL  Final  Fungus Culture With Stain     Status: None (Preliminary result)   Collection Time: 11/02/15  2:41 PM  Result Value Ref Range Status   Fungus Stain Final report  Final    Comment: (NOTE) Performed At: [facility] [place], [place] [id number] Lindon Romp MD [id number]    Fungus (Mycology) Culture PENDING  Incomplete   Fungal Source CSF  Final  Fungus Culture Result     Status: None   Collection Time: 11/02/15  2:41 PM  Result Value Ref Range Status   Result 1 Comment  Final    Comment: (NOTE) KOH/Calcofluor preparation:  no fungus observed. Performed At: [facility] [place], [place] [id number] Lindon Romp MD [id number]      Time coordinating discharge: Over 30 minutes  SIGNED:  Marzetta Board, MD  [facility] 11/08/2015, 11:38 AM Pager [phone]  If 7PM-7AM, please contact night-coverage [email or web address] Password [email or web address]

## 2015-11-08 NOTE — Progress Notes (Signed)
Patient in a stable condition, transferred to [address] by a NT on a wheelchair

## 2015-11-08 NOTE — H&P (View-Only) (Signed)
Physical Medicine and Rehabilitation Admission H&P    Chief Complaint  Patient presents with  . Altered Mental Status    HPI:  [name] is a 60 y.o. 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 He was found to be septic due to group B strep agalactiae and was intubated due to agitation. 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 2 D echo done revealing EF 60-65% with mild AVR and questionable density anterior mitral valve with MVP. TEE negative for endocarditis, no thrombus or PFO. EEG showed diffuse cerebral dysfunction question due to hypoxic/ischemic injury, encephalopathy or propofol. He tolerated extubation on 10/31 and antibiotics narrowed to PCN G per ID input.   Dr. [name] felt that GBS bacteremia due to recent procedure or immunosuppression from occult malignancy. To continue IV antibiotics for 2 weeks total after last negative BC.  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 CSF Culture negative--viral culture pending. Marland Kitchen MRI brain done showing ares of layering diffusion restriction concerning for ventriculitis and  Abnormal DWI intensity in both frontal lobes question sequelae of remote hemorrhage or leptomeningeal abnormality. Drainage from ear due to otitis externa treated with ciprodex and improving.  Dr. [name] has followed up with patient and recommends partial nephrectomy --to follow up with Dr. [name] after holidays. Mentation improving and therapy  Ongoing and showed impulsivity with balance deficits and poor safety awareness.  CIR recommended  for follow up therapy.     Review of Systems  HENT: Positive for hearing loss. Negative for tinnitus.   Eyes: Negative for blurred vision and double vision.  Respiratory: Negative for cough and shortness of breath.   Cardiovascular: Positive for leg swelling. Negative for chest pain.  Genitourinary: Positive for frequency and urgency. Negative for dysuria.       Would like to use condom caths at nights.   Musculoskeletal: Negative for back pain, joint pain and myalgias.  Skin: Negative for itching and rash.  Neurological: Positive for speech change. Negative for dizziness, weakness and headaches.  Psychiatric/Behavioral: Negative for memory loss. The patient does not have insomnia.     Past Medical History:  Diagnosis Date  . Bacteremia 110/2017  . CVA (cerebral infarction)    hemmorhagic  CVA  . Dyslipidemia   . Endocarditis    MV Vegitation  . Mitral regurgitation   . Myocardial infarction   . Stroke [facility])     Past Surgical History:  Procedure Laterality Date  . NO PAST SURGERIES      Family History  Problem Relation Age of Onset  . Heart disease Mother     mom RF as a child (died of heart problem in  49 )    Social History: Married--wife works but has a flexible schedule. Disabled Chief Executive Officer. He reports that he has never smoked. He has never used smokeless tobacco.  He drinks 1/2 bottle of wine daily? He reports that he does not drink use drugs.    Allergies: No Known Allergies    Medications Prior to Admission  Medication Sig Dispense Refill  . buPROPion (WELLBUTRIN XL) 150 MG 24 hr tablet Take 300  mg by mouth daily.    . Cholecalciferol (VITAMIN D PO) Take 1 tablet by mouth daily.    . diphenhydramine-acetaminophen (TYLENOL PM) 25-500 MG TABS tablet Take 2 tablets by mouth at bedtime.    . Multiple Vitamin (MULTIVITAMIN WITH MINERALS) TABS tablet Take 1 tablet by mouth daily.    . rosuvastatin (CRESTOR) 10 MG tablet Take 1 tablet (10 mg total) by mouth daily. 90  tablet 3    Home: Home Living Family/patient expects to be discharged to:: Private residence Living Arrangements: Spouse/significant other (second wife, not his wife Rod Holler, who was with him during init) Available Help at Discharge: Family, Available PRN/intermittently Type of Home: House Home Access: Stairs to enter [employer] of Steps: 3 Entrance Stairs-Rails: None Home Layout: Two level Alternate Level Stairs-Number of Steps: 15 Alternate Level Stairs-Rails: Can reach both Bathroom Shower/Tub: Multimedia programmer: Handicapped height Bathroom Accessibility: Yes Home Equipment: None  Lives With: Spouse   Functional History: Prior Function Level of Independence: Independent Comments: just finished a bike tour of [place]  Functional Status:  Mobility: Bed Mobility Overal bed mobility: Needs Assistance Bed Mobility: Supine to Sit, Sit to Supine Supine to sit: Min guard Sit to supine: Min guard General bed mobility comments: EOB on arrival with nursing Transfers Overall transfer level: Needs assistance Equipment used: Rolling walker (2 wheeled) Transfers: Sit to/from Stand, [employer] Transfers Sit to Stand: Min guard Stand pivot transfers: Min assist General transfer comment: min guard for safety and min a for balance  Ambulation/Gait Ambulation/Gait assistance: Min assist Ambulation Distance (Feet): 350 Feet Assistive device: Rolling walker (2 wheeled) Gait Pattern/deviations: Step-through pattern, Decreased stride length General Gait Details: pt with variation of too far anterior/too posterior to RW with cues throughout for RW use, safety and obstacles, pt running into obstacles x 4 during gait despite cues. Pt denied further distance due to fatigue Gait velocity: decreased Gait velocity interpretation: Below normal speed for age/gender    ADL: ADL Overall ADL's : Needs assistance/impaired Eating/Feeding: Supervision/ safety, Set  up Eating/Feeding Details (indicate cue type and reason): increased spillage with spoon, instructed wife to allow pt to self feed Grooming: Wash/dry hands, Wash/dry face, Oral care, Min guard, Standing Grooming Details (indicate cue type and reason): decreased throughness, and cues to locate all needed items  Upper Body Bathing: Minimal assitance, Sitting Lower Body Bathing: Moderate assistance, Sit to/from stand Lower Body Bathing Details (indicate cue type and reason): decreased thoroughness and assist for balance  Upper Body Dressing : Minimal assistance, Moderate assistance, Sitting Lower Body Dressing: Minimal assistance, Sit to/from stand Lower Body Dressing Details (indicate cue type and reason): able to don/doff socks  Toilet Transfer: Moderate assistance, Ambulation, RW, Comfort height toilet Toilet Transfer Details (indicate cue type and reason): mod A due to difficulty maneuvering in small spaces.  Freqently runs into items, loses balance and requires assist to prevent fall  Toileting- Clothing Manipulation and Hygiene: Minimal assistance, Sit to/from stand Toileting - Clothing Manipulation Details (indicate cue type and reason): assist for thouroughness  Functional mobility during ADLs: Minimal assistance, Moderate assistance, Rolling walker General ADL Comments: Pt with poor awareness of environment.  He frequently runs into obstacles in his environment requiring mod A to prevent fall   Cognition: Cognition Overall Cognitive Status: Impaired/Different from baseline Arousal/Alertness: Awake/alert Orientation Level: Oriented to person, Oriented to place, Oriented to situation, Disoriented to time Attention: Sustained Sustained Attention: Impaired Sustained Attention Impairment: Verbal complex, Functional complex Memory: Appears intact Awareness: Impaired Awareness  Impairment: Intellectual impairment Problem Solving: Impaired Problem Solving Impairment: Verbal complex,  Functional complex Executive Function: Self Monitoring Self Monitoring: Impaired Self Monitoring Impairment: Verbal complex, Functional complex Behaviors: Impulsive Safety/Judgment: Appears intact Cognition Arousal/Alertness: Lethargic, Awake/alert Behavior During Therapy: Flat affect, Impulsive Overall Cognitive Status: Impaired/Different from baseline Area of Impairment: Orientation, Attention, Memory, Following commands, Safety/judgement, Awareness, Problem solving Orientation Level: Disoriented to, Situation, Time (reliant on external cues ) Current Attention Level: Selective Memory: Decreased short-term memory Following Commands: Follows one step commands consistently Safety/Judgement: Decreased awareness of safety, Decreased awareness of deficits Awareness: Intellectual Problem Solving: Slow processing, Difficulty sequencing, Requires verbal cues, Requires tactile cues General Comments: Pt requires cues for problem solving.  Demonstrates impaired memory    Blood pressure (!) 167/77, pulse 79, temperature 98.2 F (36.8 C), temperature source Oral, resp. rate 16, height 5\' 8"  (1.727 m), weight 85.1 kg (187 lb 9.6 oz), SpO2 99 %. Physical Exam  Nursing note and vitals reviewed. Constitutional: He is oriented to person, place, and time. He appears well-developed and well-nourished.  HENT:  Head: Normocephalic and atraumatic.  Mouth/Throat: Oropharynx is clear and moist.  Eyes: EOM are normal. Pupils are equal, round, and reactive to light.  Neck: Normal range of motion. Neck supple.  Cardiovascular: Normal rate and regular rhythm.   Respiratory: Effort normal and breath sounds normal. No stridor. He has no wheezes. He exhibits no tenderness.  GI: Soft. Bowel sounds are normal. He exhibits no distension. There is no tenderness.  Musculoskeletal: He exhibits edema.  Neurological: He is alert and oriented to person, place, and time.  Alert and interactive. Mild left facial  weakness. CN exam otherwise non-focal. More appropriate and asking appropriate questions regarding rehab/safey. Able to follow basic commands without difficulty. Moves all 4 but seems to have some delay with left arm and leg movement. Strength grossly 4/5 prox to distal LUE and LLE. RUE and RLE are 4+/5. Some delays in processing and displays distraction.   Skin: Skin is warm and dry.  Psychiatric:  A bit anxious, slightly disinhibted.     No results found for this or any previous visit (from the past 48 hour(s)). No results found.     Medical Problem List and Plan: 1.  Functional and cognitive deficits secondary to septicemia and meningitis  -admit to inpatient rehab 2.  DVT Prophylaxis/Anticoagulation: Pharmaceutical: Lovenox 3. Pain Management: Tylenol prn 4. Mood: Mood stable on Wellbutrin XL. LCSW to follow for evaluation and support.  5. Neuropsych: This patient is not fully capable of making decisions on his own behalf. 6. Skin/Wound Care: Routine pressure relief measures.  7. Fluids/Electrolytes/Nutrition: Monitor I/O. Check lytes in am.  8. Group B strep bacteremia with meningitis: On PCN thorough 11/2 9. BPH s/p TURP: On finasteride as cannot tolerate flomax.  10 Otitis externa: ON ciprodex drops thorough 11/8.  11. Leucocytosis: Continue to monitor for now. Recheck CBC in am.  12. Encephalopathy: Much improved but continues to poor safety awareness with impulsivity.  13. Right renal mass: To follow up with Dr. [name] on outpatient basis.  14.MVP: Now on lopressor bid.  15. Hyponatremia: Will recheck lytes in am.  16. HTN: Monitor BP bid.    Post Admission Physician Evaluation: 1. Functional deficits secondary  to sepsis/meningitis. 2. Patient is admitted to receive collaborative, interdisciplinary care between the physiatrist, rehab nursing staff, and therapy team. 3. Patient's level of medical complexity and substantial therapy needs in context of that medical necessity  cannot be provided at a lesser  intensity of care such as a SNF. 4. Patient has experienced substantial functional loss from his/her baseline which was documented above under the "Functional History" and "Functional Status" headings.  Judging by the patient's diagnosis, physical exam, and functional history, the patient has potential for functional progress which will result in measurable gains while on inpatient rehab.  These gains will be of substantial and practical use upon discharge  in facilitating mobility and self-care at the household level. 5. Physiatrist will provide 24 hour management of medical needs as well as oversight of the therapy plan/treatment and provide guidance as appropriate regarding the interaction of the two. 6. 24 hour rehab nursing will assist with bladder management, bowel management, safety, skin/wound care, disease management, medication administration, pain management and patient education  and help integrate therapy concepts, techniques,education, etc. 7. PT will assess and treat for/with: Lower extremity strength, range of motion, stamina, balance, functional mobility, safety, adaptive techniques and equipment, NMR, family ed.   Goals are: mod I. 8. OT will assess and treat for/with: ADL's, functional mobility, safety, upper extremity strength, adaptive techniques and equipment, NMR, family ed, community reintegration.   Goals are: mod I. Therapy may proceed with showering this patient. 9. SLP will assess and treat for/with: cognition, family education.  Goals are: supervision to mod I. 10. Case Management and Social Worker will assess and treat for psychological issues and discharge planning. 11. Team conference will be held weekly to assess progress toward goals and to determine barriers to discharge. 12. Patient will receive at least 3 hours of therapy per day at least 5 days per week. 13. ELOS: 7-10 days       14. Prognosis:  excellent     Meredith Staggers, MD,  [employer] Physical Medicine & Rehabilitation 11/08/2015  11/08/2015

## 2015-11-08 NOTE — Care Management Note (Signed)
Case Management Note  Patient Details  Name: Poul Woolridge MRN: [id number] Date of Birth: July 26, 1955  Subjective/Objective:   GBS bacteremia and meningitis, right ear infection                 Action/Plan: Discharge Planning: AVS reviewed: Chart reviewed. Pt scheduled for dc to CIR today.   PCP  Josetta Huddle MD  Expected Discharge Date:  11/08/2015               Expected Discharge Plan:  [place]  In-House Referral:  NA  Discharge planning Services  CM Consult  Post Acute Care Choice:  NA Choice offered to:  NA  DME Arranged:  N/A DME Agency:  NA  HH Arranged:  NA HH Agency:  NA  Status of Service:  Completed, signed off  If discussed at [place] of Stay Meetings, dates discussed:    Additional Comments:  Erenest Rasher, RN 11/08/2015, 12:39 PM

## 2015-11-08 NOTE — PMR Pre-admission (Signed)
PMR Admission Coordinator Pre-Admission Assessment  Patient: [name] is an 60 y.o., male MRN: [id number] DOB: 1955-01-24 Height: 5\' 8"  (075-GRM cm) Weight: 85.1 kg (187 lb 9.6 oz)              Insurance Information HMO:     PPO: yes     PCP:      IPA:      80/20:      OTHER:  PRIMARY: [employer] [place]      Policy#: [id number]      Subscriber: [name] CM Name: [name]      Phone#: [phone] ext [phone]     Fax#: [phone] Pre-Cert#: [id number] approved until 11/13 when updates are due      Employer: retired Benefits:  Phone #: [phone]     Name: 11/07/2015 Eff. Date: 01/02/15     Deduct: $5500      Out of Pocket Max: [phone]      Life Max: none CIR: 80%      SNF: 80% Outpatient: 80%     Co-Pay: 30 visits each [name], OT and SLP Home Health: 80%      Co-Pay: visits per medical necessity DME: 80%     Co-Pay: 20% Providers: in network  SECONDARY: none       Medicaid Application Date:       Case Manager:  Disability Application Date:       Case Worker:   Emergency Facilities manager Information    Name Relation Home Work Mobile   Flora,[name] Spouse   [phone]   No name specified         Current Medical History  Patient Admitting Diagnosis: encephalopathy due to sepsis  History of Present Illness:  HPI: Zacharias Pavao is a 60 y.o. 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 He was found to be septic due to group B strep agalactiae and was intubated due to agitation. 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 2 D echo done revealing EF 60-65% with mild AVR and questionable density anterior mitral valve with MVP. TEE negative for endocarditis, no thrombus or PFO. EEG  showed diffuse cerebral dysfunction question due to hypoxic/ischemic injury, encephalopathy or propofol. He tolerated extubation on 10/31 and antibiotics narrowed to PCN G per ID input.   Dr. [name] felt that GBS bacteremia due to recent procedure or immunosuppression from occult malignancy. To continue IV antibiotics for 2 weeks total after last negative BC.  Neurology consulted due to ongoing issues with agitation and LP done showing evidence of meningitis. MRI brain done showing areas of layering diffusion restriction concerning for ventriculitis and  Abnormal DWI intensity in both frontal lobes question sequelae of remote hemorrhage or leptomeningeal abnormality. Drainage from ear due to otitis externa treated with ciprodex and improving.  Dr. [name] has followed up with patient and recommends partial nephrectomy --to follow up with Dr. [name] after holidays. Mentation improving and [name]/OT evaluations done showing generalized weakness with cognitive deficits.   Past Medical History  Past Medical History:  Diagnosis Date  . Bacteremia 110/2017  . CVA (cerebral infarction)    hemmorhagic  CVA  . Dyslipidemia   . Endocarditis    MV Vegitation  . Mitral regurgitation   .  Myocardial infarction   . Stroke [facility])     Family History  family history includes Heart disease in his mother.  Prior Rehab/Hospitalizations:  Has the patient had major surgery during 100 days prior to admission? Yes  Current Medications   Current Facility-Administered Medications:  .  0.9 %  sodium chloride infusion, 250 mL, Intravenous, PRN, Luz Brazen, MD, Last Rate: 10 mL/hr at 11/02/15 2100, 250 mL at 11/02/15 2100 .  acetaminophen (TYLENOL) tablet 650 mg, 650 mg, Oral, Q6H PRN, Rush Farmer, MD, 650 mg at 11/06/15 1003 .  benzonatate (TESSALON) capsule 100 mg, 100 mg, Oral, TID, Ripudeep K Rai, MD, 100 mg at 11/08/15 1119 .  ciprofloxacin-dexamethasone (CIPRODEX) 0.3-0.1 % otic suspension 4 drop, 4 drop,  Right Ear, BID, Thayer Headings, MD, 4 drop at 11/08/15 1121 .  enoxaparin (LOVENOX) injection 40 mg, 40 mg, Subcutaneous, Q24H, Ihor Austin, RPH, 40 mg at 11/07/15 1234 .  finasteride (PROSCAR) tablet 5 mg, 5 mg, Oral, Daily, Carolan Clines, MD, 5 mg at 11/08/15 1119 .  guaiFENesin-dextromethorphan (ROBITUSSIN DM) 100-10 MG/5ML syrup 5 mL, 5 mL, Oral, Q4H PRN, Ripudeep K Rai, MD .  hydrALAZINE (APRESOLINE) injection 10-20 mg, 10-20 mg, Intravenous, Q6H PRN, Collene Gobble, MD .  MEDLINE mouth rinse, 15 mL, Mouth Rinse, BID, Praveen Mannam, MD, 15 mL at 11/08/15 1000 .  metoprolol tartrate (LOPRESSOR) tablet 25 mg, 25 mg, Oral, BID, Chesley Mires, MD, 25 mg at 11/08/15 1119 .  penicillin G potassium 4 Million Units in dextrose 5 % 250 mL IVPB, 4 Million Units, Intravenous, Q4H, Alexa Angela Burke, MD, 4 Million Units at 11/08/15 1139 .  rosuvastatin (CRESTOR) tablet 10 mg, 10 mg, Oral, Daily, Luz Brazen, MD, 10 mg at 11/08/15 1119 .  sodium chloride flush (NS) 0.9 % injection 10-40 mL, 10-40 mL, Intracatheter, PRN, Caren Griffins, MD, 10 mL at 11/06/15 2014  Patients Current Diet: DIET DYS 3 Room service appropriate? Yes; Fluid consistency: Thin  Precautions / Restrictions Precautions Precautions: Fall Precaution Comments: incontinent, HOH Restrictions Weight Bearing Restrictions: No   Has the patient had 2 or more falls or a fall with injury in the past year?No  Prior Activity Level Community (5-7x/wk): Independent and driving pta. Active. disabled since CVA. 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 Walks his dog . 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 None of the above  Prior Functional Level Prior Function Level of Independence: Independent Comments: just finished a bike tour of [place]  Self Care:  Did the patient need help bathing, dressing, using the toilet or eating?  Independent  Indoor Mobility: Did the patient need assistance with walking from room to room (with or without device)? Independent  Stairs: Did the patient need assistance with internal or external stairs (with or without device)? Independent  Functional Cognition: Did the patient need help planning regular tasks such as shopping or remembering to take medications? Independent  Current Functional Level Cognition  Arousal/Alertness: Awake/alert Overall Cognitive Status: Impaired/Different from baseline Current Attention Level: Selective Orientation Level: Oriented to person, Oriented to place, Oriented to situation, Disoriented to time Following Commands: Follows one step commands consistently Safety/Judgement: Decreased awareness of safety, Decreased awareness of deficits General Comments: [name] requires cues for problem solving.  Demonstrates impaired memory  Attention: Sustained Sustained Attention: Impaired Sustained Attention Impairment: Verbal complex, Functional  complex Memory: Appears intact Awareness: Impaired Awareness Impairment: Intellectual impairment Problem Solving: Impaired Problem Solving Impairment: Verbal complex, Functional complex Executive Function: Self Monitoring Self Monitoring: Impaired Self Monitoring Impairment: Verbal complex, Functional complex Behaviors: Impulsive Safety/Judgment: Appears intact    Extremity Assessment (includes Sensation/Coordination)  Upper Extremity Assessment: RUE deficits/detail, LUE deficits/detail RUE Deficits / Details: generalized weakness, incoordination LUE Deficits / Details: generalized weakness, incoordination  Lower Extremity Assessment: Generalized weakness    ADLs  Overall ADL's : Needs assistance/impaired Eating/Feeding: Supervision/ safety, Set up Eating/Feeding Details (indicate cue type and reason): increased spillage with spoon,  instructed wife to allow [name] to self feed Grooming: Wash/dry hands, Wash/dry face, Oral care, Min guard, Standing Grooming Details (indicate cue type and reason): decreased throughness, and cues to locate all needed items  Upper Body Bathing: Minimal assitance, Sitting Lower Body Bathing: Moderate assistance, Sit to/from stand Lower Body Bathing Details (indicate cue type and reason): decreased thoroughness and assist for balance  Upper Body Dressing : Minimal assistance, Moderate assistance, Sitting Lower Body Dressing: Minimal assistance, Sit to/from stand Lower Body Dressing Details (indicate cue type and reason): able to don/doff socks  Toilet Transfer: Moderate assistance, Ambulation, RW, Comfort height toilet Toilet Transfer Details (indicate cue type and reason): mod A due to difficulty maneuvering in small spaces.  Freqently runs into items, loses balance and requires assist to prevent fall  Toileting- Clothing Manipulation and Hygiene: Minimal assistance, Sit to/from stand Toileting - Clothing Manipulation Details (indicate cue type and reason): assist for thouroughness  Functional mobility during ADLs: Minimal assistance, Moderate assistance, Rolling walker General ADL Comments: [name] with poor awareness of environment.  He frequently runs into obstacles in his environment requiring mod A to prevent fall     Mobility  Overal bed mobility: Needs Assistance Bed Mobility: Supine to Sit, Sit to Supine Supine to sit: Min guard Sit to supine: Min guard General bed mobility comments: EOB on arrival with nursing    Transfers  Overall transfer level: Needs assistance Equipment used: Rolling walker (2 wheeled) Transfers: Sit to/from Stand, [employer] Transfers Sit to Stand: Min guard Stand pivot transfers: Min assist General transfer comment: min guard for safety and min a for balance     Ambulation / Gait / Stairs / Wheelchair Mobility  Ambulation/Gait Ambulation/Gait assistance: Regulatory affairs officer (Feet): 350 Feet Assistive device: Rolling walker (2 wheeled) Gait Pattern/deviations: Step-through pattern, Decreased stride length General Gait Details: [name] with variation of too far anterior/too posterior to RW with cues throughout for RW use, safety and obstacles, [name] running into obstacles x 4 during gait despite cues. [name] denied further distance due to fatigue Gait velocity: decreased Gait velocity interpretation: Below normal speed for age/gender    Posture / Balance Balance Overall balance assessment: Needs assistance Sitting-balance support: Feet supported Sitting balance-[name] Scale: Good Standing balance support: Single extremity supported, During functional activity Standing balance-[name] Scale: Fair Standing balance comment: heavy reliance on RW for UE support, posterior lean against bed for stabilization. Decreased righting reactions High level balance activites: Direction changes, Turns High Level Balance Comments: patient with difficulty making turns using RW, loss of balance noted x3, moderate assist to stabilize    Special needs/care consideration BiPAP/CPAP n/a CPM  N/a Continuous Drip IV  N/a Dialysis  N/a Life Vest  N/a Oxygen  N/a Special Bed  N/a Trach Size  N/a Wound Vac (area)  N/a Skin intact Bowel mgmt: incontinent LBM 11/7 Bladder mgmt:incontinent Diabetic mgmt  N/a HOH normally at baseline  in left ear. 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 which affects his ability to cognitively follow conversation   Previous Home Environment Living Arrangements: Spouse/significant other (second wife, not his wife Rod Holler, who was with him during init)  Lives With: Spouse Available Help at Discharge: Family, Available PRN/intermittently Type of Home: House Home Layout: Two level Alternate Level Stairs-Rails: Can reach both Alternate Level Stairs-Number of Steps: 15 Home Access: Stairs to enter Entrance Stairs-Rails: None Entrance Stairs-Number of Steps:  3 Bathroom Shower/Tub: Multimedia programmer: Handicapped height Bathroom Accessibility: Yes How Accessible: Accessible via walker [place]: No  Discharge Living Setting Plans for Discharge Living Setting: Patient's home, Lives with (comment) (wife, [name]. Married for three years) Type of Home at Discharge: House Discharge Home Layout: Two level Alternate Level Stairs-Rails: Right, Left, Can reach both Alternate Level Stairs-Number of Steps: 15 Discharge Home Access: Stairs to enter Entrance Stairs-Rails: None Entrance Stairs-Number of Steps: 3 Discharge Bathroom Shower/Tub: Walk-in shower Discharge Bathroom Toilet: Handicapped height Discharge Bathroom Accessibility: Yes How Accessible: Accessible via walker Does the patient have any problems obtaining your medications?: No  Social/Family/Support Systems Patient Roles: Spouse, Parent (3 adult children, 2 daughters and son) Sport and exercise psychologist Information: Statistician, wife Anticipated Caregiver: wife Anticipated Ambulance person Information: see above Ability/Limitations of Caregiver: wife is a Cabin crew so works with flexible schedule Caregiver Availability: Intermittent Discharge Plan Discussed with Primary Caregiver: Yes Is Caregiver In Agreement with Plan?: Yes Does Caregiver/Family have Issues with Lodging/Transportation while [name] is in Rehab?: No  Goals/Additional Needs Patient/Family Goal for Rehab: Mod I with [name], OT, and SLP Expected length of stay: ELOS 5- 9 days [name]/Family Agrees to Admission and willing to participate: Yes Program Orientation Provided & Reviewed with [name]/Caregiver Including Roles  & Responsibilities: Yes  Decrease burden of Care through IP rehab admission: n/a  Possible need for SNF placement upon discharge: n/c  Patient Condition: This patient's condition remains as documented in the consult dated 11/07/2015, in which the Rehabilitation Physician determined and documented that the patient's  condition is appropriate for intensive rehabilitative care in an inpatient rehabilitation facility. Will admit to inpatient rehab today.   Preadmission Screen Completed By:  Cleatrice Burke, 11/08/2015 12:16 PM ______________________________________________________________________   Discussed status with Dr. Naaman Plummer on 11/08/2015 at  1228 and received telephone approval for admission today.  Admission Coordinator:  Cleatrice Burke, time 1228 Date 11/08/2015

## 2015-11-08 NOTE — Progress Notes (Signed)
I have insurance approval to admit pt to inpt rehab today. I have contacted Dr. Cruzita Lederer and he is in agreement to d/c to CIR today. RN CM made aware. I will make the arrangements. SP:[id number]

## 2015-11-08 NOTE — H&P (Signed)
Physical Medicine and Rehabilitation Admission H&P    Chief Complaint  Patient presents with  . Altered Mental Status    HPI:  [name] is a 60 y.o. 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 He was found to be septic due to group B strep agalactiae and was intubated due to agitation. 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 2 D echo done revealing EF 60-65% with mild AVR and questionable density anterior mitral valve with MVP. TEE negative for endocarditis, no thrombus or PFO. EEG showed diffuse cerebral dysfunction question due to hypoxic/ischemic injury, encephalopathy or propofol. He tolerated extubation on 10/31 and antibiotics narrowed to PCN G per ID input.   Dr. [name] felt that GBS bacteremia due to recent procedure or immunosuppression from occult malignancy. To continue IV antibiotics for 2 weeks total after last negative BC.  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 CSF Culture negative--viral culture pending. Marland Kitchen MRI brain done showing ares of layering diffusion restriction concerning for ventriculitis and  Abnormal DWI intensity in both frontal lobes question sequelae of remote hemorrhage or leptomeningeal abnormality. Drainage from ear due to otitis externa treated with ciprodex and improving.  Dr. [name] has followed up with patient and recommends partial nephrectomy --to follow up with Dr. [name] after holidays. Mentation improving and therapy  Ongoing and showed impulsivity with balance deficits and poor safety awareness.  CIR recommended  for follow up therapy.     Review of Systems  HENT: Positive for hearing loss. Negative for tinnitus.   Eyes: Negative for blurred vision and double vision.  Respiratory: Negative for cough and shortness of breath.   Cardiovascular: Positive for leg swelling. Negative for chest pain.  Genitourinary: Positive for frequency and urgency. Negative for dysuria.       Would like to use condom caths at nights.   Musculoskeletal: Negative for back pain, joint pain and myalgias.  Skin: Negative for itching and rash.  Neurological: Positive for speech change. Negative for dizziness, weakness and headaches.  Psychiatric/Behavioral: Negative for memory loss. The patient does not have insomnia.     Past Medical History:  Diagnosis Date  . Bacteremia 110/2017  . CVA (cerebral infarction)    hemmorhagic  CVA  . Dyslipidemia   . Endocarditis    MV Vegitation  . Mitral regurgitation   . Myocardial infarction   . Stroke [facility])     Past Surgical History:  Procedure Laterality Date  . NO PAST SURGERIES      Family History  Problem Relation Age of Onset  . Heart disease Mother     mom RF as a child (died of heart problem in  15 )    Social History: Married--wife works but has a flexible schedule. Disabled Chief Executive Officer. He reports that he has never smoked. He has never used smokeless tobacco.  He drinks 1/2 bottle of wine daily? He reports that he does not drink use drugs.    Allergies: No Known Allergies    Medications Prior to Admission  Medication Sig Dispense Refill  . buPROPion (WELLBUTRIN XL) 150 MG 24 hr tablet Take 300  mg by mouth daily.    . Cholecalciferol (VITAMIN D PO) Take 1 tablet by mouth daily.    . diphenhydramine-acetaminophen (TYLENOL PM) 25-500 MG TABS tablet Take 2 tablets by mouth at bedtime.    . Multiple Vitamin (MULTIVITAMIN WITH MINERALS) TABS tablet Take 1 tablet by mouth daily.    . rosuvastatin (CRESTOR) 10 MG tablet Take 1 tablet (10 mg total) by mouth daily. 90  tablet 3    Home: Home Living Family/patient expects to be discharged to:: Private residence Living Arrangements: Spouse/significant other (second wife, not his wife Rod Holler, who was with him during init) Available Help at Discharge: Family, Available PRN/intermittently Type of Home: House Home Access: Stairs to enter [employer] of Steps: 3 Entrance Stairs-Rails: None Home Layout: Two level Alternate Level Stairs-Number of Steps: 15 Alternate Level Stairs-Rails: Can reach both Bathroom Shower/Tub: Multimedia programmer: Handicapped height Bathroom Accessibility: Yes Home Equipment: None  Lives With: Spouse   Functional History: Prior Function Level of Independence: Independent Comments: just finished a bike tour of [place]  Functional Status:  Mobility: Bed Mobility Overal bed mobility: Needs Assistance Bed Mobility: Supine to Sit, Sit to Supine Supine to sit: Min guard Sit to supine: Min guard General bed mobility comments: EOB on arrival with nursing Transfers Overall transfer level: Needs assistance Equipment used: Rolling walker (2 wheeled) Transfers: Sit to/from Stand, [employer] Transfers Sit to Stand: Min guard Stand pivot transfers: Min assist General transfer comment: min guard for safety and min a for balance  Ambulation/Gait Ambulation/Gait assistance: Min assist Ambulation Distance (Feet): 350 Feet Assistive device: Rolling walker (2 wheeled) Gait Pattern/deviations: Step-through pattern, Decreased stride length General Gait Details: pt with variation of too far anterior/too posterior to RW with cues throughout for RW use, safety and obstacles, pt running into obstacles x 4 during gait despite cues. Pt denied further distance due to fatigue Gait velocity: decreased Gait velocity interpretation: Below normal speed for age/gender    ADL: ADL Overall ADL's : Needs assistance/impaired Eating/Feeding: Supervision/ safety, Set  up Eating/Feeding Details (indicate cue type and reason): increased spillage with spoon, instructed wife to allow pt to self feed Grooming: Wash/dry hands, Wash/dry face, Oral care, Min guard, Standing Grooming Details (indicate cue type and reason): decreased throughness, and cues to locate all needed items  Upper Body Bathing: Minimal assitance, Sitting Lower Body Bathing: Moderate assistance, Sit to/from stand Lower Body Bathing Details (indicate cue type and reason): decreased thoroughness and assist for balance  Upper Body Dressing : Minimal assistance, Moderate assistance, Sitting Lower Body Dressing: Minimal assistance, Sit to/from stand Lower Body Dressing Details (indicate cue type and reason): able to don/doff socks  Toilet Transfer: Moderate assistance, Ambulation, RW, Comfort height toilet Toilet Transfer Details (indicate cue type and reason): mod A due to difficulty maneuvering in small spaces.  Freqently runs into items, loses balance and requires assist to prevent fall  Toileting- Clothing Manipulation and Hygiene: Minimal assistance, Sit to/from stand Toileting - Clothing Manipulation Details (indicate cue type and reason): assist for thouroughness  Functional mobility during ADLs: Minimal assistance, Moderate assistance, Rolling walker General ADL Comments: Pt with poor awareness of environment.  He frequently runs into obstacles in his environment requiring mod A to prevent fall   Cognition: Cognition Overall Cognitive Status: Impaired/Different from baseline Arousal/Alertness: Awake/alert Orientation Level: Oriented to person, Oriented to place, Oriented to situation, Disoriented to time Attention: Sustained Sustained Attention: Impaired Sustained Attention Impairment: Verbal complex, Functional complex Memory: Appears intact Awareness: Impaired Awareness  Impairment: Intellectual impairment Problem Solving: Impaired Problem Solving Impairment: Verbal complex,  Functional complex Executive Function: Self Monitoring Self Monitoring: Impaired Self Monitoring Impairment: Verbal complex, Functional complex Behaviors: Impulsive Safety/Judgment: Appears intact Cognition Arousal/Alertness: Lethargic, Awake/alert Behavior During Therapy: Flat affect, Impulsive Overall Cognitive Status: Impaired/Different from baseline Area of Impairment: Orientation, Attention, Memory, Following commands, Safety/judgement, Awareness, Problem solving Orientation Level: Disoriented to, Situation, Time (reliant on external cues ) Current Attention Level: Selective Memory: Decreased short-term memory Following Commands: Follows one step commands consistently Safety/Judgement: Decreased awareness of safety, Decreased awareness of deficits Awareness: Intellectual Problem Solving: Slow processing, Difficulty sequencing, Requires verbal cues, Requires tactile cues General Comments: Pt requires cues for problem solving.  Demonstrates impaired memory    Blood pressure (!) 167/77, pulse 79, temperature 98.2 F (36.8 C), temperature source Oral, resp. rate 16, height 5\' 8"  (1.727 m), weight 85.1 kg (187 lb 9.6 oz), SpO2 99 %. Physical Exam  Nursing note and vitals reviewed. Constitutional: He is oriented to person, place, and time. He appears well-developed and well-nourished.  HENT:  Head: Normocephalic and atraumatic.  Mouth/Throat: Oropharynx is clear and moist.  Eyes: EOM are normal. Pupils are equal, round, and reactive to light.  Neck: Normal range of motion. Neck supple.  Cardiovascular: Normal rate and regular rhythm.   Respiratory: Effort normal and breath sounds normal. No stridor. He has no wheezes. He exhibits no tenderness.  GI: Soft. Bowel sounds are normal. He exhibits no distension. There is no tenderness.  Musculoskeletal: He exhibits edema.  Neurological: He is alert and oriented to person, place, and time.  Alert and interactive. Mild left facial  weakness. CN exam otherwise non-focal. More appropriate and asking appropriate questions regarding rehab/safey. Able to follow basic commands without difficulty. Moves all 4 but seems to have some delay with left arm and leg movement. Strength grossly 4/5 prox to distal LUE and LLE. RUE and RLE are 4+/5. Some delays in processing and displays distraction.   Skin: Skin is warm and dry.  Psychiatric:  A bit anxious, slightly disinhibted.     No results found for this or any previous visit (from the past 48 hour(s)). No results found.     Medical Problem List and Plan: 1.  Functional and cognitive deficits secondary to septicemia and meningitis  -admit to inpatient rehab 2.  DVT Prophylaxis/Anticoagulation: Pharmaceutical: Lovenox 3. Pain Management: Tylenol prn 4. Mood: Mood stable on Wellbutrin XL. LCSW to follow for evaluation and support.  5. Neuropsych: This patient is not fully capable of making decisions on his own behalf. 6. Skin/Wound Care: Routine pressure relief measures.  7. Fluids/Electrolytes/Nutrition: Monitor I/O. Check lytes in am.  8. Group B strep bacteremia with meningitis: On PCN thorough 11/2 9. BPH s/p TURP: On finasteride as cannot tolerate flomax.  10 Otitis externa: ON ciprodex drops thorough 11/8.  11. Leucocytosis: Continue to monitor for now. Recheck CBC in am.  12. Encephalopathy: Much improved but continues to poor safety awareness with impulsivity.  13. Right renal mass: To follow up with Dr. [name] on outpatient basis.  14.MVP: Now on lopressor bid.  15. Hyponatremia: Will recheck lytes in am.  16. HTN: Monitor BP bid.    Post Admission Physician Evaluation: 1. Functional deficits secondary  to sepsis/meningitis. 2. Patient is admitted to receive collaborative, interdisciplinary care between the physiatrist, rehab nursing staff, and therapy team. 3. Patient's level of medical complexity and substantial therapy needs in context of that medical necessity  cannot be provided at a lesser  intensity of care such as a SNF. 4. Patient has experienced substantial functional loss from his/her baseline which was documented above under the "Functional History" and "Functional Status" headings.  Judging by the patient's diagnosis, physical exam, and functional history, the patient has potential for functional progress which will result in measurable gains while on inpatient rehab.  These gains will be of substantial and practical use upon discharge  in facilitating mobility and self-care at the household level. 5. Physiatrist will provide 24 hour management of medical needs as well as oversight of the therapy plan/treatment and provide guidance as appropriate regarding the interaction of the two. 6. 24 hour rehab nursing will assist with bladder management, bowel management, safety, skin/wound care, disease management, medication administration, pain management and patient education  and help integrate therapy concepts, techniques,education, etc. 7. PT will assess and treat for/with: Lower extremity strength, range of motion, stamina, balance, functional mobility, safety, adaptive techniques and equipment, NMR, family ed.   Goals are: mod I. 8. OT will assess and treat for/with: ADL's, functional mobility, safety, upper extremity strength, adaptive techniques and equipment, NMR, family ed, community reintegration.   Goals are: mod I. Therapy may proceed with showering this patient. 9. SLP will assess and treat for/with: cognition, family education.  Goals are: supervision to mod I. 10. Case Management and Social Worker will assess and treat for psychological issues and discharge planning. 11. Team conference will be held weekly to assess progress toward goals and to determine barriers to discharge. 12. Patient will receive at least 3 hours of therapy per day at least 5 days per week. 13. ELOS: 7-10 days       14. Prognosis:  excellent     Meredith Staggers, MD,  [facility] 11/08/2015  11/08/2015

## 2015-11-09 ENCOUNTER — Inpatient Hospital Stay (HOSPITAL_COMMUNITY): Payer: BLUE CROSS/BLUE SHIELD | Admitting: Speech Pathology

## 2015-11-09 ENCOUNTER — Inpatient Hospital Stay (HOSPITAL_COMMUNITY): Payer: BLUE CROSS/BLUE SHIELD | Admitting: Occupational Therapy

## 2015-11-09 ENCOUNTER — Inpatient Hospital Stay (HOSPITAL_COMMUNITY): Payer: BLUE CROSS/BLUE SHIELD | Admitting: Physical Therapy

## 2015-11-09 DIAGNOSIS — I159 Secondary hypertension, unspecified: Secondary | ICD-10-CM

## 2015-11-09 DIAGNOSIS — R7881 Bacteremia: Secondary | ICD-10-CM

## 2015-11-09 DIAGNOSIS — G9340 Encephalopathy, unspecified: Secondary | ICD-10-CM

## 2015-11-09 DIAGNOSIS — D72820 Lymphocytosis (symptomatic): Secondary | ICD-10-CM

## 2015-11-09 DIAGNOSIS — H60399 Other infective otitis externa, unspecified ear: Secondary | ICD-10-CM

## 2015-11-09 LAB — CBC WITH DIFFERENTIAL/PLATELET
Basophils Absolute: 0 10*3/uL (ref 0.0–0.1)
Basophils Relative: 0 %
EOS PCT: 3 %
Eosinophils Absolute: 0.3 10*3/uL (ref 0.0–0.7)
HCT: 40.6 % (ref 39.0–52.0)
Hemoglobin: 13.7 g/dL (ref 13.0–17.0)
LYMPHS ABS: 2 10*3/uL (ref 0.7–4.0)
LYMPHS PCT: 17 %
MCH: 29.5 pg (ref 26.0–34.0)
MCHC: 33.7 g/dL (ref 30.0–36.0)
MCV: 87.5 fL (ref 78.0–100.0)
MONO ABS: 1.1 10*3/uL — AB (ref 0.1–1.0)
MONOS PCT: 10 %
Neutro Abs: 8.3 10*3/uL — ABNORMAL HIGH (ref 1.7–7.7)
Neutrophils Relative %: 70 %
PLATELETS: 481 10*3/uL — AB (ref 150–400)
RBC: 4.64 MIL/uL (ref 4.22–5.81)
RDW: 12.9 % (ref 11.5–15.5)
WBC: 11.8 10*3/uL — ABNORMAL HIGH (ref 4.0–10.5)

## 2015-11-09 LAB — COMPREHENSIVE METABOLIC PANEL
ALT: 40 U/L (ref 17–63)
AST: 18 U/L (ref 15–41)
Albumin: 2.9 g/dL — ABNORMAL LOW (ref 3.5–5.0)
Alkaline Phosphatase: 67 U/L (ref 38–126)
Anion gap: 11 (ref 5–15)
BUN: 11 mg/dL (ref 6–20)
CHLORIDE: 104 mmol/L (ref 101–111)
CO2: 22 mmol/L (ref 22–32)
CREATININE: 0.98 mg/dL (ref 0.61–1.24)
Calcium: 9.1 mg/dL (ref 8.9–10.3)
Glucose, Bld: 122 mg/dL — ABNORMAL HIGH (ref 65–99)
Potassium: 4 mmol/L (ref 3.5–5.1)
Sodium: 137 mmol/L (ref 135–145)
Total Bilirubin: 0.7 mg/dL (ref 0.3–1.2)
Total Protein: 6.7 g/dL (ref 6.5–8.1)

## 2015-11-09 MED ORDER — BOOST / RESOURCE BREEZE PO LIQD
1.0000 | Freq: Three times a day (TID) | ORAL | Status: DC
  Administered 2015-11-09 – 2015-11-13 (×6): 1 via ORAL

## 2015-11-09 NOTE — Evaluation (Addendum)
Occupational Therapy Assessment and Plan  Patient Details  Name: [name] MRN: [id number] Date of Birth: August 04, 1955  OT Diagnosis: cognitive deficits and muscle weakness (generalized) Rehab Potential: Rehab Potential (ACUTE ONLY): Good ELOS: 5-7 days   Today's Date: 11/09/2015 OT Individual Time: 1000-1100 OT Individual Time Calculation (min): 60 min      Problem List: Patient Active Problem List   Diagnosis Date Noted  . Lymphocytosis   . Secondary hypertension   . Infective otitis externa   . Encephalopathy   . Debility   . History of CVA with residual deficit   . History of endocarditis   . 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 Meningitis   . Leukocytosis   . ETOH abuse   . Hyponatremia   . Hypokalemia   . Dysphagia   . Bacteremia   . Delirium   . Respiratory failure ([place])   . Acute encephalopathy   . Elevated troponin   . MVP (mitral valve prolapse)   . Mitral valve insufficiency   . Sepsis ([place]) 10/29/2015  . Dyslipidemia 10/26/2010  . Mitral valve disorders(424.0) 08/25/2008  . DVT 08/25/2008  . SEIZURE DISORDER 08/25/2008  . CHEST PAIN 08/25/2008  . DYSPHAGIA UNSPECIFIED 08/25/2008  . MYOCARDIAL INFARCTION, ANTERIOR WALL, INITIAL EPISODE 06/18/2008  . 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 Other and unspecified hyperlipidemia 06/11/2008  . Essential hypertension 06/11/2008  . ENDOCARDITIS, BACTERIAL, ACUTE 06/11/2008  . FOLLICULITIS 28/36/6294  . NEPHROLITHIASIS, HX OF 06/11/2008    Past Medical History:  Past Medical History:  Diagnosis Date  . Bacteremia 110/2017  . CVA (cerebral infarction)    hemmorhagic  CVA  . Dyslipidemia   . Endocarditis    MV Vegitation  . Mitral regurgitation   . Myocardial infarction   . Stroke [facility])    Past Surgical History:  Past Surgical History:  Procedure Laterality Date  . 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 He was found to be septic due to group B strep agalactiae and was intubated due to agitation. 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 2 D echo done revealing EF 60-65% with mild AVR and questionable density anterior mitral valve with MVP. TEE negative for endocarditis, no thrombus or PFO. EEG showed diffuse cerebral dysfunction question due to hypoxic/ischemic injury, encephalopathy or propofol. He tolerated extubation on 10/31 and antibiotics narrowed to PCN G per ID input.   Dr. [name] felt that GBS bacteremia due to recent procedure or immunosuppression from occult malignancy. To continue IV antibiotics for 2 weeks total after last negative BC. 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 CSF Culture negative--viral culture pending. Marland Kitchen MRI brain done showing ares of layering diffusion restriction concerning for ventriculitis and Abnormal DWI intensity in both frontal lobes question sequelae of remote hemorrhage or leptomeningeal abnormality. Drainage from ear due to otitis externa treated with ciprodex and improving. Dr. [name] has followed up with patient and recommends partial nephrectomy --to follow up with Dr. [name] after holidays. Mentation improving and therapy  Ongoing and showed impulsivity with balance deficits and poor safety awareness.  CIR recommended for follow up therapy.   Patient transferred to CIR on 11/08/2015 .    Patient currently requires min with basic self-care skills secondary to muscle weakness, decreased  cardiorespiratoy endurance, decreased safety awareness and decreased standing balance.  Prior to hospitalization, patient could complete ADLs/ IADLs with independent .  Patient will benefit from skilled intervention to decrease level of assist with basic self-care skills, increase independence with basic self-care skills and increase level of independence with iADL prior to discharge home with care partner.  Anticipate patient will require intermittent supervision and follow up home health.  OT - End of Session Activity Tolerance: Tolerates 30+ min activity with multiple rests Endurance Deficit: Yes Endurance Deficit Description: Seated rest break during standing bathing tasks OT Assessment Rehab Potential (ACUTE ONLY): Good Barriers to Discharge: Decreased caregiver support Barriers to Discharge Comments: Unable to state who could provided consistent 24 hr care OT Patient demonstrates impairments in the following area(s): Balance;Cognition;Endurance;Safety OT Basic ADL's Functional Problem(s): Grooming;Bathing;Dressing;Toileting OT Advanced ADL's Functional Problem(s): Simple Meal Preparation OT Transfers Functional Problem(s): Toilet;Tub/Shower OT Additional Impairment(s): None OT Plan OT Intensity: Minimum of 1-2 x/day, 45 to 90 minutes OT Frequency: 5 out of 7 days OT Duration/Estimated Length of Stay: 5-7 days OT Treatment/Interventions: Balance/vestibular training;Cognitive remediation/compensation;Discharge planning;Community reintegration;DME/adaptive equipment instruction;Functional mobility training;Pain management;Psychosocial support;Patient/family education;Self Care/advanced ADL retraining;Therapeutic Activities;Therapeutic Exercise;UE/LE Strength taining/ROM;UE/LE Coordination activities OT Self Feeding Anticipated Outcome(s): Mod I OT Basic Self-Care Anticipated Outcome(s): Mod I OT Toileting Anticipated Outcome(s): Mod I OT Bathroom Transfers Anticipated Outcome(s):  Supervision- mod I OT Recommendation Patient destination: Home Follow Up Recommendations: Home health OT Equipment Recommended: To be determined   Skilled Therapeutic Intervention Pt seen for OT eval and ADL bathing/dressing session.  He completed bathing standing at sink, UE support on sink ledge and min steadying assist required. He dressed seated in chair with set-up assist and assist to thread weaker L LE into underwear. Pt ambulated initially with RW with min A. Progressed to ambulating while managing IV pole, no AD. Throughout session required verbal and tactile cuing for RW management/ safety. Pt impulsive throughout with all mobility and poor awareness of management of IV pole, requiring VCs for safety. Pt left seated in recliner at end of session, QRB donned and all needs in reach. He was educated throughout session regarding role of OT, POC, OT goals, CIR, current level of function, and d/c planning.   OT Evaluation Precautions/Restrictions  Precautions Precautions: Fall Precaution Comments: HOH General Chart Reviewed: Yes Pain Pain Assessment Pain Assessment: No/denies pain Home Living/Prior Functioning Home Living Available Help at Discharge: Family, Available PRN/intermittently Type of Home: House Home Access: Stairs to enter [employer] of Steps: 3 Entrance Stairs-Rails: None Home Layout: Two level, Bed/bath upstairs, Full bath on main level Alternate Level Stairs-Number of Steps: 24-30 Alternate Level Stairs-Rails: Left, Right Bathroom Shower/Tub: Multimedia programmer: Handicapped height Bathroom Accessibility: Yes Additional Comments: wife is Cabin crew and can work partially from home  Lives With: Spouse Prior Function Level of Independence: Independent with basic ADLs, Independent with transfers, Independent with gait, Independent with homemaking with ambulation  Able to Take Stairs?: Yes Driving: Yes Vocation: On disability Comments:  enjoys reading Vision/Perception  Vision- History Baseline Vision/History: Wears glasses Wears Glasses: Reading only Patient Visual Report: No change from baseline  Cognition Overall Cognitive Status: Within Functional Limits for tasks assessed Arousal/Alertness: Awake/alert Orientation Level: Person;Place;Situation Person: Oriented Place: Oriented Situation: Oriented  Year: 2017 Month: November Day of Week: Correct Memory: Appears intact Immediate Memory Recall: Sock;Blue;Bed Memory Recall: Sock;Blue;Bed Memory Recall Sock: Without Cue Memory Recall Blue: Without Cue Memory Recall Bed: Without Cue Sustained Attention: Appears intact Awareness: Impaired Awareness Impairment: Emergent impairment Problem Solving: Impaired Problem Solving Impairment: Verbal complex;Functional complex Behaviors: Impulsive Safety/Judgment: Appears intact Comments: Impulsive with decreased awareness of deficits Sensation Sensation Light Touch: Appears Intact Coordination Gross Motor Movements are Fluid and Coordinated: Yes Fine Motor Movements are Fluid and Coordinated: Yes Motor  Motor Motor: Within Functional Limits;Other (comment) Motor - Skilled Clinical Observations: Generalized weakness and instability Trunk/Postural Assessment  Cervical Assessment Cervical Assessment: Within Functional Limits Thoracic Assessment Thoracic Assessment: Exceptions to [facility] (Kyphotic) Lumbar Assessment Lumbar Assessment: Exceptions to [facility] (Posterior pelvic tilt) Postural Control Postural Control: Within Functional Limits  Balance Balance Balance Assessed: Yes Static Sitting Balance Static Sitting - Balance Support: Feet unsupported Static Sitting - Level of Assistance: 6: Modified independent (Device/Increase time) Dynamic Sitting Balance Dynamic Sitting - Balance Support: Feet supported;During functional activity Dynamic Sitting - Level of Assistance: 6: Modified independent (Device/Increase  time);5: Stand by assistance Static Standing Balance Static Standing - Balance Support: Right upper extremity supported;Left upper extremity supported;During functional activity Static Standing - Level of Assistance: 4: Min assist Dynamic Standing Balance Dynamic Standing - Balance Support: During functional activity;Right upper extremity supported;Left upper extremity supported Dynamic Standing - Level of Assistance: 4: Min assist Dynamic Standing - Comments: Standing to complete grooming/ bathing tasks Extremity/Trunk Assessment RUE Assessment RUE Assessment: Within Functional Limits LUE Assessment LUE Assessment: Exceptions to [facility] (slightly impaired fine motor due to hx of CVA)   See Function Navigator for Current Functional Status.   Refer to Care Plan for Long Term Goals  Recommendations for other services: None  Discharge Criteria: Patient will be discharged from OT if patient refuses treatment 3 consecutive times without medical reason, if treatment goals not met, if there is a change in medical status, if patient makes no progress towards goals or if patient is discharged from hospital.  The above assessment, treatment plan, treatment alternatives and goals were discussed and mutually agreed upon: by patient  Ernestina Patches 11/09/2015, 3:13 PM

## 2015-11-09 NOTE — IPOC Note (Signed)
Overall Plan of Care [facility]) Patient Details Name: [name] MRN: [id number] DOB: 10/07/1955  Admitting Diagnosis: encephalopathy sepsis  Hospital Problems: Active Problems:   Meningitis   Debility   Lymphocytosis   Secondary hypertension   Infective otitis externa   Encephalopathy     Functional Problem List: Nursing Bladder, Bowel, Endurance, Medication Management, Skin Integrity, Safety  PT Balance, Behavior, Endurance, Motor, Nutrition, Safety  OT Balance, Cognition, Endurance, Safety  SLP Cognition, Safety  TR         Basic ADL's: OT Grooming, Bathing, Dressing, Toileting     Advanced  ADL's: OT Simple Meal Preparation     Transfers: PT Bed Mobility, Bed to Chair, Car, Manufacturing systems engineer, Metallurgist: PT Ambulation, Emergency planning/management officer, Stairs     Additional Impairments: OT None  SLP Swallowing      TR      Anticipated Outcomes Item Anticipated Outcome  Self Feeding Mod I  Swallowing  regular textures, thin liquids, Mod I    Basic self-care  Mod I  Toileting  Mod I   Bathroom Transfers Supervision- mod I  Bowel/Bladder  Continent bowel and bladder, no retention, infection.  Transfers  mod I  Locomotion  mod I household, supervision community  Communication     Cognition  Mod I  Pain  Managed at goal 2/10  Safety/Judgment  Increased safety awareness, no falls,injury this admission.   Therapy Plan: PT Intensity: Minimum of 1-2 x/day ,45 to 90 minutes PT Frequency: 5 out of 7 days PT Duration Estimated Length of Stay: 5-7 days OT Intensity: Minimum of 1-2 x/day, 45 to 90 minutes OT Frequency: 5 out of 7 days OT Duration/Estimated Length of Stay: 5-7 days SLP Intensity: Minumum of 1-2 x/day, 30 to 90 minutes SLP Frequency: 3 to 5 out of 7 days SLP Duration/Estimated Length of Stay: 7-10 days       Team Interventions: Nursing Interventions Patient/Family Education, Bowel Management, Bladder Management, Disease  Management/Prevention, Skin Care/Wound Management, Discharge Planning, Medication Management  PT interventions Ambulation/gait training, Training and development officer, Community reintegration, Discharge planning, DME/adaptive equipment instruction, Functional mobility training, Neuromuscular re-education, Pain management, Patient/family education, Psychosocial support, Stair training, Therapeutic Activities, Therapeutic Exercise, UE/LE Strength taining/ROM, UE/LE Coordination activities  OT Interventions Training and development officer, Cognitive remediation/compensation, Discharge planning, Community reintegration, DME/adaptive equipment instruction, Functional mobility training, Pain management, Psychosocial support, Patient/family education, Self Care/advanced ADL retraining, Therapeutic Activities, Therapeutic Exercise, UE/LE Strength taining/ROM, UE/LE Coordination activities  SLP Interventions Cognitive remediation/compensation, Cueing hierarchy, Dysphagia/aspiration precaution training, Functional tasks, Patient/family education, Therapeutic Activities, Environmental controls, Internal/external aids  TR Interventions    SW/CM Interventions Discharge Planning, Psychosocial Support, Patient/Family Education    Team Discharge Planning: Destination: PT-Home ,OT- Home , SLP-Home Projected Follow-up: PT-Outpatient PT, OT-  Home health OT, SLP- (TBD) Projected Equipment Needs: PT-None recommended by PT, OT- To be determined, SLP-None recommended by SLP Equipment Details: PT- , OT-  Patient/family involved in discharge planning: PT- Patient, Family member/caregiver,  OT-Patient, SLP-Patient, Family member/caregiver  MD ELOS: 6-9 days. Medical Rehab Prognosis:  Good Assessment: 60 y.o.[name] history of MV endocarditis and embolic stroke [id number] left sided weakness and reactive emotional dyscontrol syndrome , BPH s/p TURP two days PTA on 10/29/15 with fall, confusion, agitation and B/B incontinence.  He was found to be septic due to group B strep agalactiae and was intubated due to agitation. CT head with old left temporal infarct with encephalomalacia and CT abdomen/pelvis showed 14 mm liver lesion as well as  53 mm right renal mass likely representing renal malignancy. 2 D echo done revealing EF 60-65% with mild AVR and questionable density anterior mitral valve with MVP. TEE negative for endocarditis, no thrombus or PFO. EEG showed diffuse cerebral dysfunction question due to hypoxic/ischemic injury, encephalopathy or propofol. He tolerated extubation on 10/31 and antibiotics narrowed to PCN G per ID input. Dr. [name] felt that GBS bacteremia due to recent procedure or immunosuppression from occult malignancy. To continue IV antibiotics for 2 weeks total after last negative BC. Neurology consulted due to ongoing issues with agitation and LP done showing evidence of meningitis with elevated protein and WBC with segmented neutrophils. CSF Culture negative--fungal culture pending. MRI brain done showing ares of layering diffusion restriction concerning for ventriculitis and Abnormal DWI intensity in both frontal lobes question sequelae of remote hemorrhage or leptomeningeal abnormality. Drainage from ear due to otitis externa treated with ciprodex and improving. Dr. [name] has followed up with patient and recommends partial nephrectomy --to follow up with Dr. [name] after holidays. Mentation improving and therapy  Ongoing and showed impulsivity with balance deficits and poor safety awareness.  Will set goals for Mod I for most tasks with PT/OT/SLP.  See Team Conference Notes for weekly updates to the plan of care

## 2015-11-09 NOTE — Progress Notes (Signed)
Patient information reviewed and entered into eRehab system by Quaran Kedzierski, RN, CRRN, PPS Coordinator.  Information including medical coding and functional independence measure will be reviewed and updated through discharge.    

## 2015-11-09 NOTE — Progress Notes (Signed)
Recreational Therapy Session Note  Patient Details  Name: [name] MRN: [id number] Date of Birth: Jun 09, 1955 Today's Date: 11/09/2015  Pain: no c/o  Pt participated in animal assisted activity/therapy seated w/c level with family member present, supervision level.  Antionetta Ator 11/09/2015, 2:47 PM

## 2015-11-09 NOTE — Progress Notes (Addendum)
[facility] PHYSICAL MEDICINE & REHABILITATION     PROGRESS NOTE  Subjective/Complaints:  Pt seen laying in bed this AM.  He slept well overnight.  He is looking forward to getting started and getting stronger.   ROS: Denies CP, SOB, N/V/D.  Objective: Vital Signs: Blood pressure (!) 149/76, pulse 88, temperature 97.9 F (36.6 C), temperature source Oral, resp. rate 18, height 5\' 9"  (1.753 m), weight 85.1 kg (187 lb 9.8 oz), SpO2 99 %. No results found.  Recent Labs  11/09/15 0500  WBC 11.8*  HGB 13.7  HCT 40.6  PLT 481*    Recent Labs  11/09/15 0500  NA 137  K 4.0  CL 104  GLUCOSE 122*  BUN 11  CREATININE 0.98  CALCIUM 9.1   CBG (last 3)  No results for input(s): GLUCAP in the last 72 hours.  Wt Readings from Last 3 Encounters:  11/09/15 85.1 kg (187 lb 9.8 oz)  11/08/15 85.1 kg (187 lb 9.6 oz)  10/17/15 92.4 kg (203 lb 12.8 oz)    Physical Exam:  BP (!) 149/76 (BP Location: Left Arm)   Pulse 88   Temp 97.9 F (36.6 C) (Oral)   Resp 18   Ht 5\' 9"  (1.753 m)   Wt 85.1 kg (187 lb 9.8 oz)   SpO2 99%   BMI 27.71 kg/m  Constitutional: He appears well-developed and well-nourished. NAD. HENT: Normocephalic and atraumatic.  Eyes: EOM are normal. No discharge. Cardiovascular: Normal rate and regular rhythm.  No JVD. Respiratory: Effort normal and breath sounds normal. No stridor. He has no wheezes.  GI: Soft. Bowel sounds are normal. He exhibits no distension.  Musculoskeletal: He exhibits edema.  Neurological: He is alert and oriented.  Mild left facial weakness.  Able to follow basic commands without difficulty.  Motor: LUE/LLE: 4-4+/5 prox to distal  RUE/RLE 4+/5 proximal to distal Skin: Skin is warm and dry.  Psychiatric: Normal mood and behavior.  Assessment/Plan: 1. Functional deficits secondary to septicemia and meningitis which require 3+ hours per day of interdisciplinary therapy in a comprehensive inpatient rehab setting. Physiatrist is providing  close team supervision and 24 hour management of active medical problems listed below. Physiatrist and rehab team continue to assess barriers to discharge/monitor patient progress toward functional and medical goals.  Function:  Bathing Bathing position      Bathing parts      Bathing assist        Upper Body Dressing/Undressing Upper body dressing                    Upper body assist        Lower Body Dressing/Undressing Lower body dressing                                  Lower body assist        Toileting Toileting          Toileting assist     Transfers Chair/bed transfer             Locomotion Ambulation           Wheelchair          Cognition Comprehension    Expression    Social Interaction    Problem Solving    Memory       Medical Problem List and Plan: 1.  Functional and cognitive deficits secondary to septicemia and meningitis  Begin CIR  Fungal cultures pending 2.  DVT Prophylaxis/Anticoagulation: Pharmaceutical: Lovenox 3. Pain Management: Tylenol prn 4. Mood: Mood stable on Wellbutrin XL. LCSW to follow for evaluation and support.  5. Neuropsych: This patient is not fully capable of making decisions on his own behalf. 6. Skin/Wound Care: Routine pressure relief measures.  7. Fluids/Electrolytes/Nutrition: Monitor I/O.  8. Group B strep bacteremia with meningitis:   PCN thorough 11/13 per ID 9. BPH s/p TURP: On finasteride as cannot tolerate flomax.  10 Otitis externa: Cont ciprodex drops thorough 11/8.  11. Leucocytosis:   Continue to monitor for now.   WBCs 11.8 on 11/8 (trending down) 12. Encephalopathy: Much improved but continues to poor safety awareness with impulsivity.  13. Right renal mass: To follow up with Dr. [name] on outpatient basis.  14.MVP: Cont lopressor bid.  15. Hyponatremia: ?Resolved  137 on 11/8 16. HTN: Monitor BP bid with increased activity.    LOS (Days) 1 A FACE TO FACE  EVALUATION WAS PERFORMED  Julia Kulzer Lorie Phenix 11/09/2015 7:58 AM

## 2015-11-09 NOTE — Evaluation (Signed)
Speech Language Pathology Assessment and Plan  Patient Details  Name: [name] MRN: [id number] Date of Birth: Aug 14, 1955  SLP Diagnosis: Cognitive Impairments;Dysphagia  Rehab Potential: Excellent ELOS: 7-10 days    Today's Date: 11/09/2015 SLP Individual Time: 8295-6213 SLP Individual Time Calculation (min): 60 min    Problem List:  Patient Active Problem List   Diagnosis Date Noted  . Lymphocytosis   . Secondary hypertension   . Infective otitis externa   . Encephalopathy   . Debility   . History of CVA with residual deficit   . History of endocarditis   . 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 Meningitis   . Leukocytosis   . ETOH abuse   . Hyponatremia   . Hypokalemia   . Dysphagia   . Bacteremia   . Delirium   . Respiratory failure ([place])   . Acute encephalopathy   . Elevated troponin   . MVP (mitral valve prolapse)   . Mitral valve insufficiency   . Sepsis ([place]) 10/29/2015  . Dyslipidemia 10/26/2010  . Mitral valve disorders(424.0) 08/25/2008  . DVT 08/25/2008  . SEIZURE DISORDER 08/25/2008  . CHEST PAIN 08/25/2008  . DYSPHAGIA UNSPECIFIED 08/25/2008  . MYOCARDIAL INFARCTION, ANTERIOR WALL, INITIAL EPISODE 06/18/2008  . 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 Other and unspecified hyperlipidemia 06/11/2008  . Essential hypertension 06/11/2008  . ENDOCARDITIS, BACTERIAL, ACUTE 06/11/2008  . FOLLICULITIS 08/65/7846  . NEPHROLITHIASIS, HX OF 06/11/2008   Past Medical History:  Past Medical History:  Diagnosis Date  . Bacteremia 110/2017  . CVA (cerebral infarction)    hemmorhagic  CVA  . Dyslipidemia   . Endocarditis    MV Vegitation  . Mitral regurgitation   . Myocardial infarction   . Stroke [facility])    Past Surgical History:  Past Surgical History:  Procedure Laterality Date  . NO PAST SURGERIES      Assessment / Plan / Recommendation Clinical Impression Patientis a 60 y.o.malewith history of MV endocarditis and  embolic stroke 9629--BMWU left sided weakness and reactive emotional dyscontrol syndrome , BPH s/p TURP two days PTA on 10/29/15 with fall, confusion, agitation and B/B incontinence. He was found to be septic due to group B strep agalactiae and was intubated due to agitation. 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 2 D echo done revealing EF 60-65% with mild AVR and questionable density anterior mitral valve with MVP. TEE negative for endocarditis, no thrombus or PFO. EEG showed diffuse cerebral dysfunction question due to hypoxic/ischemic injury, encephalopathy or propofol. He tolerated extubation on 10/31 and antibiotics narrowed to PCN G per ID input.    Dr. [name] felt that GBS bacteremia due to recent procedure or immunosuppression from occult malignancy. To continue IV antibiotics for 2 weeks total after last negative BC. 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 CSF Culture negative--viral culture pending. Marland Kitchen MRI brain done showing ares of layering diffusion restriction concerning for ventriculitis and Abnormal DWI intensity in both frontal lobes question sequelae of remote hemorrhage or leptomeningeal abnormality. Drainage from ear due to otitis externa treated with ciprodex and improving. Dr. [name] has followed up with patient and recommends partial nephrectomy --to follow up with Dr. [name] after holidays. Mentation improving and therapy  Ongoing and showed impulsivity with  balance deficits and poor safety awareness.  CIR recommended for follow up therapy and admitted 11/08/15.  Patient was administered BSE and observed with trial of upgraded regular textures, showing efficient mastication and no oral residue. Recommend patient upgrade  to regular textures. Patient with intermittent immediate cough throughout trials of thin liquids via straw, however etiology is unclear due to patient's report of dry cough over past several days. Patient has been on thin liquids since 11/03/15 and has thusfar exhibited no fever with lungs clear to ascultation, therefore aspiration unlikely but recommend continued monitoring at this time. Patient was administered subtests 1-6 of the Assessment of Language-Related Functional Activities (ALFA), and scored WFL on telling time, counting money, solving daily math problems, understanding medicine labels, and related functional activities. Patient does not display any impairments of basic cognitive function, however higher level organization of information, awareness, and problem solving deficits are suspected. Patient would benefit from SLP services to maximize independence with complex functional activities and concepts and safety with current, regular diet.    Skilled Therapeutic Interventions          BSE and cognitive-linguistic evaluation administered, please see above for details.   SLP Assessment  Patient will need skilled Speech Lanaguage Pathology Services during CIR admission    Recommendations  SLP Diet Recommendations: Thin;Age appropriate regular solids Liquid Administration via: Cup;Straw Medication Administration: Whole meds with liquid (small pills whole with liquid, large whole in puree) Supervision: Intermittent supervision to cue for compensatory strategies;Patient able to self feed Compensations: Slow rate;Small sips/bites Postural Changes and/or Swallow Maneuvers: Seated upright 90 degrees;Upright 30-60 min after meal Oral Care Recommendations: Oral care BID Patient destination: Home Follow up Recommendations:  (TBD) Equipment Recommended: None recommended by SLP    SLP Frequency 3 to 5 out of 7 days   SLP Duration  SLP Intensity  SLP Treatment/Interventions 7-10 days  Minumum  of 1-2 x/day, 30 to 90 minutes  Cognitive remediation/compensation;Cueing hierarchy;Dysphagia/aspiration precaution training;Functional tasks;Patient/family education;Therapeutic Activities;Environmental controls;Internal/external aids    Pain Pain Assessment Pain Assessment: No/denies pain  Prior Functioning Cognitive/Linguistic Baseline: Within functional limits Type of Home: House  Lives With: Spouse Available Help at Discharge: Family;Available PRN/intermittently Vocation: On disability  Function:  Eating Eating   Modified Consistency Diet: No Eating Assist Level: More than reasonable amount of time           Cognition Comprehension Comprehension assist level: Follows complex conversation/direction with extra time/assistive device  Expression   Expression assist level: Expresses complex 90% of the time/cues < 10% of the time  Social Interaction Social Interaction assist level: Interacts appropriately with others with medication or extra time (anti-anxiety, antidepressant).  Problem Solving Problem solving assist level: Solves complex 90% of the time/cues < 10% of the time  Memory Memory assist level: Complete Independence: No helper   Short Term Goals: Week 1: SLP Short Term Goal 1 (Week 1): Patient will consume regular textures and thin liquids with no overt s/s of aspiration with Mod I.  SLP Short Term Goal 2 (Week 1): Patient will demonstrate moderately to highly complex functional problem solving with Mod I.  SLP Short Term Goal 3 (Week 1): Patient will demonstrate anticipatory awareness of compensatory strategies/safety needs during daily activities given Mod I.   Refer to Care Plan for Long Term Goals  Recommendations for other services: None  Discharge Criteria: Patient will be discharged from SLP if patient refuses treatment 3 consecutive times without medical reason, if treatment goals not met, if there is a  change in medical status, if patient makes no  progress towards goals or if patient is discharged from hospital.  The above assessment, treatment plan, treatment alternatives and goals were discussed and mutually agreed upon: by patient and by family  [name] 11/09/2015, 3:19 PM

## 2015-11-09 NOTE — Evaluation (Signed)
Physical Therapy Assessment and Plan  Patient Details  Name: [name] MRN: [id number] Date of Birth: 15-Apr-1955  PT Diagnosis: Abnormality of gait, Difficulty walking and Muscle weakness Rehab Potential: Good ELOS: 5-7 days   Today's Date: 11/09/2015 PT Individual Time: 1300-1400 and 2876-8115 PT Individual Time Calculation (min): 60 min and 53 min    Problem List:  Patient Active Problem List   Diagnosis Date Noted  . Lymphocytosis   . Secondary hypertension   . Infective otitis externa   . Encephalopathy   . Debility   . History of CVA with residual deficit   . History of endocarditis   . 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 Meningitis   . Leukocytosis   . ETOH abuse   . Hyponatremia   . Hypokalemia   . Dysphagia   . Bacteremia   . Delirium   . Respiratory failure ([place])   . Acute encephalopathy   . Elevated troponin   . MVP (mitral valve prolapse)   . Mitral valve insufficiency   . Sepsis ([place]) 10/29/2015  . Dyslipidemia 10/26/2010  . Mitral valve disorders(424.0) 08/25/2008  . DVT 08/25/2008  . SEIZURE DISORDER 08/25/2008  . CHEST PAIN 08/25/2008  . DYSPHAGIA UNSPECIFIED 08/25/2008  . MYOCARDIAL INFARCTION, ANTERIOR WALL, INITIAL EPISODE 06/18/2008  . 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 Other and unspecified hyperlipidemia 06/11/2008  . Essential hypertension 06/11/2008  . ENDOCARDITIS, BACTERIAL, ACUTE 06/11/2008  . FOLLICULITIS 72/62/0355  . NEPHROLITHIASIS, HX OF 06/11/2008    Past Medical History:  Past Medical History:  Diagnosis Date  . Bacteremia 110/2017  . CVA (cerebral infarction)    hemmorhagic  CVA  . Dyslipidemia   . Endocarditis    MV Vegitation  . Mitral regurgitation   . Myocardial infarction   . Stroke [facility])    Past Surgical History:  Past Surgical History:  Procedure Laterality Date  . NO PAST SURGERIES      Assessment & Plan Clinical Impression: Maurice Sperlingis a 60 y.o.malewith history  of MV endocarditis and embolic stroke 9741--ULAG left sided weakness and reactive emotional dyscontrol syndrome , BPH s/p TURP two days PTA on 10/29/15 with fall, confusion, agitation and B/B incontinence. He was found to be septic due to group B strep agalactiae and was intubated due to agitation. 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 2 D echo done revealing EF 60-65% with mild AVR and questionable density anterior mitral valve with MVP. TEE negative for endocarditis, no thrombus or PFO. EEG showed diffuse cerebral dysfunction question due to hypoxic/ischemic injury, encephalopathy or propofol. He tolerated extubation on 10/31 and antibiotics narrowed to PCN G per ID input. Dr. Baxter [name] felt that GBS bacteremia due to recent procedure or immunosuppression from occult malignancy. To continue IV antibiotics for 2 weeks total after last negative BC. 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 CSF Culture negative--viral culture pending. Marland Kitchen MRI brain done showing ares of layering diffusion restriction concerning for ventriculitis and Abnormal DWI intensity in both frontal lobes question sequelae of remote hemorrhage or leptomeningeal abnormality. Drainage from ear due to otitis externa treated with ciprodex and improving. Dr. Gaynelle [name] has followed up with patient and recommends partial nephrectomy --to follow up with Dr. Tresa [name] after holidays. Mentation improving and therapy  Ongoing and showed impulsivity with balance deficits and poor safety awarenessPatient transferred to CIR on 11/08/2015.   Patient currently requires min with mobility secondary to muscle weakness, decreased cardiorespiratoy endurance and decreased standing balance and decreased balance  strategies.  Prior to hospitalization, patient was independent  with mobility and lived with Spouse in a House home.  Home access is 3Stairs to enter.  Patient will benefit from skilled PT intervention to maximize safe functional mobility, minimize fall risk and decrease caregiver burden for planned discharge home with intermittent assist.  Anticipate patient will benefit from follow up OP at discharge.  PT - End of Session Activity Tolerance: Decreased this session;Tolerates 30+ min activity with multiple rests Endurance Deficit: Yes Endurance Deficit Description: required seated rest breaks with mobility PT Assessment Rehab Potential (ACUTE/IP ONLY): Good Barriers to Discharge: Inaccessible home environment (24-30 stairs to reach bedroom on 2nd floor) PT Patient demonstrates impairments in the following area(s): Balance;Behavior;Endurance;Motor;Nutrition;Safety PT Transfers Functional Problem(s): Bed Mobility;Bed to Chair;Car;Furniture PT Locomotion Functional Problem(s): Ambulation;Wheelchair Mobility;Stairs PT Plan PT Intensity: Minimum of 1-2 x/day ,45 to 90 minutes PT Frequency: 5 out of 7 days PT Duration Estimated Length of Stay: 5-7 days PT Treatment/Interventions: Ambulation/gait training;Balance/vestibular training;Community reintegration;Discharge planning;DME/adaptive equipment instruction;Functional mobility training;Neuromuscular re-education;Pain management;Patient/family education;Psychosocial support;Stair training;Therapeutic Activities;Therapeutic Exercise;UE/LE Strength taining/ROM;UE/LE Coordination activities PT Transfers Anticipated Outcome(s): mod I PT Locomotion Anticipated Outcome(s): mod I household, supervision community PT Recommendation Follow Up Recommendations: Outpatient PT Patient destination: Home Equipment Recommended: None recommended by PT  Skilled Therapeutic Intervention Treatment 1: Skilled therapeutic intervention initiated after completion of  evaluation. Discussed with patient and wife [name] falls risk, safety within room, and focus of therapy during stay. Discussed possible length of stay, goals, and follow-up therapy. Patient currently requires supervision-min guard assist overall pushing IV pole with cues for safe pacing. Anticipate patient would benefit from OPPT f/u when IV antibiotic treatment completed. Patient's wife assisted with providing home information and PLOF as patient extremely hard of hearing in both ears and reports they can borrow recommended DME from friend. Patient engaged in seated animal assisted therapy with recreational therapist. Patient left sitting in recliner with needs in reach and wife present.   Treatment 2: Patient in recliner upon arrival and wife departing. Session focused on dynamic standing balance, strengthening, and activity tolerance. Gait training pushing IV pole throughout rehab unit on tile and carpeted surfaces with close supervision. Patient demonstrates significant fall risk as noted by score of 44/56 on Berg Balance Scale, see details below. Performed NuStep using BUE/BLE at level 4 x 10 min for strengthening and endurance. Dynamic standing balance on carpeted surface including ambulation backwards, ambulation with eyes closed, tandem gait, and sidestepping to R and L with supervision-steady assist overall. Patient left sitting in recliner with quick release belt donned and needs in reach.   PT Evaluation Precautions/Restrictions Precautions Precautions: Fall Precaution Comments: HOH Restrictions Weight Bearing Restrictions: No General Chart Reviewed: Yes Family/Caregiver Present: Yes  Pain Pain Assessment Pain Assessment: No/denies pain Home Living/Prior Functioning Home Living Available Help at Discharge: Family;Available PRN/intermittently Type of Home: House Home Access: Stairs to enter [employer] of Steps: 3 Entrance Stairs-Rails: None Home Layout: Two level;Bed/bath  upstairs;Full bath on main level Alternate Level Stairs-Number of Steps: 24-30 Alternate Level Stairs-Rails: Left;Right Bathroom Shower/Tub: Multimedia programmer: Handicapped height Bathroom Accessibility: Yes Additional Comments: wife is Cabin crew and can work partially from home  Lives With: Spouse Prior Function Level of Independence: Independent with basic ADLs;Independent with transfers;Independent with gait;Independent  with homemaking with ambulation  Able to Take Stairs?: Yes Driving: Yes Vocation: On disability Comments: enjoys reading Vision/Perception   No change from baseline  Cognition Overall Cognitive Status: Within Functional Limits for tasks assessed Arousal/Alertness: Awake/alert Sustained Attention: Appears intact Memory: Appears intact Awareness: Impaired Awareness Impairment: Emergent impairment Problem Solving: Impaired Problem Solving Impairment: Verbal complex;Functional complex Behaviors: Impulsive Safety/Judgment: Appears intact Comments: Impulsive with decreased awareness of deficits Sensation Sensation Light Touch: Appears Intact Stereognosis: Appears Intact Hot/Cold: Appears Intact Proprioception: Appears Intact Coordination Gross Motor Movements are Fluid and Coordinated: Yes Fine Motor Movements are Fluid and Coordinated: Yes Motor  Motor Motor: Within Functional Limits Mobility Bed Mobility Bed Mobility: Sit to Supine;Supine to Sit Supine to Sit: 5: Supervision;HOB flat Sit to Supine: HOB flat;5: Supervision Transfers Transfers: Yes Stand to Sit: 5: Supervision Stand Pivot Transfers: 4: Min guard;Other (comment) (IV pole) Locomotion  Ambulation Ambulation: Yes Ambulation/Gait Assistance: 4: Min guard;5: Supervision Ambulation Distance (Feet): 350 Feet Assistive device: Other (Comment) (IV pole) Gait Gait: Yes Gait Pattern: Impaired Gait Pattern: Step-through pattern;Poor foot clearance - left;Trunk flexed;Decreased trunk  rotation Gait velocity: 10 MWT = 0.67 m/s Stairs / Additional Locomotion Stairs: Yes Stairs Assistance: 4: Min guard;5: Supervision Stair Management Technique: Two rails;Step to pattern;Forwards Number of Stairs: 12 Height of Stairs: 6 Ramp: 5: Supervision Curb: 4: Min Administrator Mobility: No  Trunk/Postural Assessment  Cervical Assessment Cervical Assessment: Within Functional Limits Thoracic Assessment Thoracic Assessment: Exceptions to [facility] (Kyphotic) Lumbar Assessment Lumbar Assessment: Exceptions to [facility] (Posterior pelvic tilt) Postural Control Postural Control: Within Functional Limits  Balance Balance Balance Assessed: Yes Standardized Balance Assessment Standardized Balance Assessment: Berg Balance Test Berg Balance Test Sit to Stand: Able to stand without using hands and stabilize independently Standing Unsupported: Able to stand safely 2 minutes Sitting with Back Unsupported but Feet Supported on Floor or Stool: Able to sit safely and securely 2 minutes Stand to Sit: Sits safely with minimal use of hands Transfers: Able to transfer safely, minor use of hands Standing Unsupported with Eyes Closed: Able to stand 10 seconds with supervision Standing Ubsupported with Feet Together: Able to place feet together independently and stand for 1 minute with supervision From Standing, Reach Forward with Outstretched Arm: Can reach forward >12 cm safely (5") From Standing Position, Pick up Object from Floor: Able to pick up shoe, needs supervision From Standing Position, Turn to Look Behind Over each Shoulder: Looks behind from both sides and weight shifts well Turn 360 Degrees: Able to turn 360 degrees safely but slowly Standing Unsupported, Alternately Place Feet on Step/Stool: Able to stand independently and complete 8 steps >20 seconds Standing Unsupported, One Foot in Front: Needs help to step but can hold 15 seconds Standing on One Leg: Able to lift  leg independently and hold equal to or more than 3 seconds Total Score: 44 Static Sitting Balance Static Sitting - Balance Support: Feet unsupported Static Sitting - Level of Assistance: 6: Modified independent (Device/Increase time) Dynamic Sitting Balance Dynamic Sitting - Balance Support: Feet supported;During functional activity Dynamic Sitting - Level of Assistance: 6: Modified independent (Device/Increase time);5: Stand by assistance Static Standing Balance Static Standing - Balance Support: Right upper extremity supported;Left upper extremity supported;During functional activity Static Standing - Level of Assistance: 4: Min assist Dynamic Standing Balance Dynamic Standing - Balance Support: During functional activity;Right upper extremity supported;Left upper extremity supported Dynamic Standing - Level of Assistance: 4: Min assist Dynamic Standing - Comments: Standing to complete grooming/ bathing tasks  Extremity Assessment  RUE Assessment RUE Assessment: Within Functional Limits LUE Assessment LUE Assessment: Exceptions to [facility] (slightly impaired fine motor due to hx of CVA) RLE Assessment RLE Assessment: Within Functional Limits LLE Assessment LLE Assessment: Within Functional Limits   See Function Navigator for Current Functional Status.   Refer to Care Plan for Long Term Goals  Recommendations for other services: None  Discharge Criteria: Patient will be discharged from PT if patient refuses treatment 3 consecutive times without medical reason, if treatment goals not met, if there is a change in medical status, if patient makes no progress towards goals or if patient is discharged from hospital.  The above assessment, treatment plan, treatment alternatives and goals were discussed and mutually agreed upon: by patient and by family  Laretta Alstrom 11/09/2015, 4:26 PM

## 2015-11-10 ENCOUNTER — Inpatient Hospital Stay (HOSPITAL_COMMUNITY): Payer: BLUE CROSS/BLUE SHIELD | Admitting: Speech Pathology

## 2015-11-10 ENCOUNTER — Inpatient Hospital Stay (HOSPITAL_COMMUNITY): Payer: BLUE CROSS/BLUE SHIELD | Admitting: Physical Therapy

## 2015-11-10 ENCOUNTER — Inpatient Hospital Stay (HOSPITAL_COMMUNITY): Payer: BLUE CROSS/BLUE SHIELD | Admitting: Occupational Therapy

## 2015-11-10 ENCOUNTER — Inpatient Hospital Stay (HOSPITAL_COMMUNITY): Payer: BLUE CROSS/BLUE SHIELD | Admitting: *Deleted

## 2015-11-10 NOTE — Progress Notes (Signed)
Speech Language Pathology Daily Session Note  Patient Details  Name: [name] MRN: [id number] Date of Birth: 03/07/1955  Today's Date: 11/10/2015 SLP Individual Time: 0930-1030 SLP Individual Time Calculation (min): 60 min   Short Term Goals: Week 1: SLP Short Term Goal 1 (Week 1): Patient will consume regular textures and thin liquids with no overt s/s of aspiration with Mod I.  SLP Short Term Goal 2 (Week 1): Patient will demonstrate moderately to highly complex functional problem solving with Mod I.  SLP Short Term Goal 3 (Week 1): Patient will demonstrate anticipatory awareness of compensatory strategies/safety needs during daily activities given Mod I.   Skilled Therapeutic Interventions:Skilled therapy intervention focused on cognitive goals. Patient was Mod I given a novel writing task. Patient independently maintained narrative and topic, problem-solving for parameters to task guidelines. Patient and spouse report that his cognitive function is equivalent to pre-hosiptalization baseline, and they are satisfied with his progress. Patient left upright in recliner with spouse in room and all needs within reach. Continue current plan of care.   Function:  Cognition Comprehension Comprehension assist level: Follows complex conversation/direction with extra time/assistive device  Expression   Expression assist level: Expresses complex ideas: With extra time/assistive device  Social Interaction Social Interaction assist level: Interacts appropriately with others - No medications needed.  Problem Solving Problem solving assist level: Solves complex problems: With extra time  Memory Memory assist level: Complete Independence: No helper    Pain Pain Assessment Pain Assessment: No/denies pain Pain Score: 0-No pain  Therapy/Group: Individual Therapy  [name] 11/10/2015, 10:55 AM

## 2015-11-10 NOTE — Progress Notes (Signed)
Inpatient [place] Individual Statement of Services  Patient Name:  [name]  Date:  11/10/2015  Welcome to the [facility].  Our goal is to provide you with an individualized program based on your diagnosis and situation, designed to meet your specific needs.  With this comprehensive rehabilitation program, you will be expected to participate in at least 3 hours of rehabilitation therapies Monday-Friday, with modified therapy programming on the weekends.  Your rehabilitation program will include the following services:  Physical Therapy (PT), Occupational Therapy (OT), Speech Therapy (ST), 24 hour per day rehabilitation nursing, Therapeutic Recreaction (TR), Case Management (Social Worker), Rehabilitation Medicine, Nutrition Services and Pharmacy Services  Weekly team conferences will be held on Wednesdays to discuss your progress.  Your Social Worker will talk with you frequently to get your input and to update you on team discussions.  Team conferences with you and your family in attendance may also be held.  Expected length of stay: 5 to 7 days  Overall anticipated outcome: Modified independent with supervision for tub transfers, meal preparation/housekeeping, community ambulation, stairs  Depending on your progress and recovery, your program may change. Your Social Worker will coordinate services and will keep you informed of any changes. Your Social Worker's name and contact numbers are listed  below.  The following services may also be recommended but are not provided by the [facility] will be made to provide these services after discharge if needed.  Arrangements include referral to agencies that provide these services.  Your insurance has been verified to be:  [employer] Your primary doctor is:  Dr.  Dwaine Deter  Pertinent information will be shared with your doctor and your insurance company.  Social Worker:  Alfonse Alpers, LCSW  [phone] or [phone]  Information discussed with and copy given to patient by: Trey Sailors, 11/10/2015, 1:09 AM

## 2015-11-10 NOTE — Progress Notes (Signed)
Speech Language Pathology Daily Session Note  Patient Details  Name: [name] MRN: [id number] Date of Birth: November 29, 1955  Today's Date: 11/10/2015 SLP Individual Time: 1500-1530 SLP Individual Time Calculation (min): 30 min   Short Term Goals: Week 1: SLP Short Term Goal 1 (Week 1): Patient will consume regular textures and thin liquids with no overt s/s of aspiration with Mod I.  SLP Short Term Goal 2 (Week 1): Patient will demonstrate moderately to highly complex functional problem solving with Mod I.  SLP Short Term Goal 3 (Week 1): Patient will demonstrate anticipatory awareness of compensatory strategies/safety needs during daily activities given Mod I.   Skilled Therapeutic Interventions: Pt was seen for skilled ST targeting cognitive goals. SLP facilitated the session with a complex scheduling task targeting functional problem solving.  Pt was able to complete task for 100% accuracy with supervision verbal cues for working memory of task components and task organization. Pt also needed x1 min assist verbal cue for route recall when ambulating back to his room from [facility] treatment room.  Pt was left with call bell within reach resting in recliner at the end of today's therapy session.  Continue per current plan of care.    Function:  Eating Eating                 Cognition Comprehension Comprehension assist level: Follows complex conversation/direction with extra time/assistive device  Expression   Expression assist level: Expresses complex ideas: With extra time/assistive device  Social Interaction Social Interaction assist level: Interacts appropriately with others with medication or extra time (anti-anxiety, antidepressant).  Problem Solving Problem solving assist level: Solves complex 90% of the time/cues < 10% of the time  Memory Memory assist level: Recognizes or recalls 90% of the time/requires cueing < 10% of the time    Pain Pain Assessment Pain Assessment:  No/denies pain  Therapy/Group: Individual Therapy  Payslee Bateson, Selinda Orion 11/10/2015, 8:06 PM

## 2015-11-10 NOTE — Evaluation (Signed)
Recreational Therapy Assessment and Plan  Patient Details  Name: [name] MRN: [id number] Date of Birth: September 28, 1955 Today's Date: 11/10/2015  Rehab Potential:  Good  ELOS:   7 days  Assessment Clinical Impression: Problem List:      Patient Active Problem List   Diagnosis Date Noted  . Lymphocytosis   . Secondary hypertension   . Infective otitis externa   . Encephalopathy   . Debility   . History of CVA with residual deficit   . History of endocarditis   . 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 Meningitis   . Leukocytosis   . ETOH abuse   . Hyponatremia   . Hypokalemia   . Dysphagia   . Bacteremia   . Delirium   . Respiratory failure ([place])   . Acute encephalopathy   . Elevated troponin   . MVP (mitral valve prolapse)   . Mitral valve insufficiency   . Sepsis ([place]) 10/29/2015  . Dyslipidemia 10/26/2010  . Mitral valve disorders(424.0) 08/25/2008  . DVT 08/25/2008  . SEIZURE DISORDER 08/25/2008  . CHEST PAIN 08/25/2008  . DYSPHAGIA UNSPECIFIED 08/25/2008  . MYOCARDIAL INFARCTION, ANTERIOR WALL, INITIAL EPISODE 06/18/2008  . 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 Other and unspecified hyperlipidemia 06/11/2008  . Essential hypertension 06/11/2008  . ENDOCARDITIS, BACTERIAL, ACUTE 06/11/2008  . FOLLICULITIS 40/98/1191  . NEPHROLITHIASIS, HX OF 06/11/2008    Past Medical History:      Past Medical History:  Diagnosis Date  . Bacteremia 110/2017  . CVA (cerebral infarction)    hemmorhagic  CVA  . Dyslipidemia   . Endocarditis    MV Vegitation  . Mitral regurgitation   . Myocardial infarction   . Stroke [facility])    Past Surgical History:       Past Surgical History:  Procedure Laterality Date  . 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 He was found to be septic due to group B strep agalactiae and was intubated due to agitation. 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 2 D echo done revealing EF 60-65% with mild AVR and questionable density anterior mitral valve with MVP. TEE negative for endocarditis, no thrombus or PFO. EEG showed diffuse cerebral dysfunction question due to hypoxic/ischemic injury, encephalopathy or propofol. He tolerated extubation on 10/31 and antibiotics narrowed to PCN G per ID input. Dr. [name] felt that GBS bacteremia due to recent procedure or immunosuppression from occult malignancy. To continue IV antibiotics for 2 weeks total after last negative BC. 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 CSF Culture negative--viral culture pending. Marland Kitchen MRI brain done showing ares of layering diffusion restriction concerning for ventriculitis and Abnormal DWI intensity in both frontal lobes question sequelae of remote hemorrhage or leptomeningeal abnormality. Drainage from ear due to otitis externa treated with ciprodex and improving. Dr. [name] has followed up with patient and recommends partial nephrectomy --to follow up with Dr. [name] after holidays. Mentation improving and therapy Ongoing and showed impulsivity with balance deficits and poor safety awarenessPatient  transferred to CIR on 11/08/2015.  Met with pt to discuss TR services, use of leisure time, activity analysis with potential modifications, community pursuits, pet care.  No further TR due to LOS.  Will monitor for community reintegration through team.  Leisure  History/Participation Premorbid leisure interest/current participation: Community - Travel (Comment);Community - Other (Comment);Sports - Exercise (Comment) (out to eat 1x week with partners; gym; walking his dog 5-10 miles a day) Other Leisure Interests: Television;Movies;Reading;Computer Leisure Participation Style: With Family/Friends Awareness of Community Resources: Excellent Psychosocial / Spiritual Stress Management: Good Patient agreeable to Pet Therapy: Yes Does patient have pets?: Yes Social interaction - Mood/Behavior: Cooperative Academic librarian Appropriate for Education?: Yes Recreational Therapy Orientation Orientation -Reviewed with patient: Available activity resources Strengths/Weaknesses Patient Strengths/Abilities: Willingness to participate Patient weaknesses: Physical limitations TR Patient demonstrates impairments in the following area(s): Endurance;Motor;Safety  Plan No further TR- short LOS  Recommendations for other services: None  Discharge Criteria: Patient will be discharged from TR if patient refuses treatment 3 consecutive times without medical reason.  If treatment goals not met, if there is a change in medical status, if patient makes no progress towards goals or if patient is discharged from hospital.  The above assessment, treatment plan, treatment alternatives and goals were discussed and mutually agreed upon: by patient  [place] 11/10/2015, 9:52 AM

## 2015-11-10 NOTE — Progress Notes (Addendum)
Social Work Patient ID: [name], male   DOB: [birth year], 60 y.o.   MRN: [id number]   Dr. Posey Pronto discussed pt's medical condition, but pt's therapy evaluations have not all been completed, so full conference note will follow next week.  It is predicted that pt will be on CIR for 5 to 7 days.  Team members present were Dr. Posey Pronto; Heather Roberts, RN; Weston Anna, SLP; Jorge Mandril, PT; Darleen Crocker, OT; Daiva Nakayama, [place] Coordinator; Gilberto Better, CSW. CSW remains available to assist as needed.

## 2015-11-10 NOTE — Progress Notes (Signed)
Physical Therapy Note  Patient Details  Name: [name] MRN: [id number] Date of Birth: 12/29/1955 Today's Date: 11/10/2015    Time: 700-755 55 minutes  1:1 No c/o pain. Standing and sitting balance with donning clothes and shoes with supervision, cues for safety with IV.  Gait throughout unit in home and controlled environment, obstacle course with stepping around and over obstacles with supervision, no LOB.  6 minute walk test with supervision, 587'.  Standing balance with tapping, standing on compliant surface with reaching and bending tasks with min A for dynamic balance. Standing kinetron for LE strength and endurance 3 x 1 minute, then seated x 3 minutes.  Pt requires frequent rests during cardio activity but is motivated to improve.   Boniface Goffe 11/10/2015, 7:59 AM

## 2015-11-10 NOTE — Progress Notes (Signed)
Social Work Assessment and Plan  Patient Details  Name: [name] MRN: [id number] Date of Birth: 19-Mar-1955  Today's Date: 11/09/2015  Problem List:  Patient Active Problem List   Diagnosis Date Noted  . Lymphocytosis   . Secondary hypertension   . Infective otitis externa   . Encephalopathy   . Debility   . History of CVA with residual deficit   . History of endocarditis   . 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 Meningitis   . Leukocytosis   . ETOH abuse   . Hyponatremia   . Hypokalemia   . Dysphagia   . Bacteremia   . Delirium   . Respiratory failure ([place])   . Acute encephalopathy   . Elevated troponin   . MVP (mitral valve prolapse)   . Mitral valve insufficiency   . Sepsis ([place]) 10/29/2015  . Dyslipidemia 10/26/2010  . Mitral valve disorders(424.0) 08/25/2008  . DVT 08/25/2008  . SEIZURE DISORDER 08/25/2008  . CHEST PAIN 08/25/2008  . DYSPHAGIA UNSPECIFIED 08/25/2008  . MYOCARDIAL INFARCTION, ANTERIOR WALL, INITIAL EPISODE 06/18/2008  . 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 Other and unspecified hyperlipidemia 06/11/2008  . Essential hypertension 06/11/2008  . ENDOCARDITIS, BACTERIAL, ACUTE 06/11/2008  . FOLLICULITIS 54/65/6812  . NEPHROLITHIASIS, HX OF 06/11/2008   Past Medical History:  Past Medical History:  Diagnosis Date  . Bacteremia 110/2017  . CVA (cerebral infarction)    hemmorhagic  CVA  . Dyslipidemia   . Endocarditis    MV Vegitation  . Mitral regurgitation   . Myocardial infarction   . Stroke [facility])    Past Surgical History:  Past Surgical History:  Procedure Laterality Date  . NO PAST SURGERIES     Social History:  reports that he has never smoked. He has never used smokeless tobacco. He reports that he does not drink alcohol or use drugs.  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   Abuse/Neglect Physical Abuse: Denies Verbal Abuse: Denies Sexual Abuse: Denies Exploitation of patient/patient's resources: Denies Self-Neglect: Denies  Emotional Status Pt's affect, behavior and adjustment status: Pt was in good spirits and ready to get to work with therapies.  Does not want to stay long on CIR, though.  Remembers being on CIR in 2010. Recent Psychosocial Issues: none reported Psychiatric History: none reported Substance Abuse History: none reported  Patient / Family Perceptions, Expectations & Goals Pt/Family understanding of illness & functional limitations: Pt reports a fair understanding of his condition and limitations.  CSW will f/u with pt's wife to check in with her about this. Premorbid pt/family roles/activities: Pt was out in the community, working with a Insurance claims handler, etc. Anticipated changes in roles/activities/participation: Pt knows he will not be able to resume all activites right away. Pt/family expectations/goals: Pt wants to gt home as soon as he's physically ready.  Community Resources Express Scripts:  None Premorbid Home Care/DME Agencies: Other (Comment) (pt cannot recall names of agencies) Transportation available at discharge: wife Resource referrals recommended: Neuropsychology  Discharge Planning Living  Arrangements: Spouse/significant other Support Systems: Spouse/significant other Type of Residence: Private residence Insurance Resources: Multimedia programmer (specify) ([place]) Museum/gallery curator Resources: Other (Comment) (disability) Financial Screen Referred: No Money Management: Patient Does the patient have any problems obtaining your medications?: No Home Management: Pt's wife will be able to manage home per pt, but CSW will confirm this with her. Patient/Family Preliminary Plans: Pt plans to return to his home with his wife to assist. Barriers to Discharge: Steps Social Work Anticipated Follow Up Needs: HH/OP Expected length of stay: ELOS 5- 9 days  Clinical Impression CSW met with pt to introduce self and role of CSW as well as to complete assessment. Pt is known to Rehab and pt remembers much how things work.  CSW did not get to meet wife, but will and will discuss DME and [facility] agencies, as pt's d/c may occur soon.  CSW will continue to follow and assist as needed.  Kolson Chovanec, Silvestre Mesi 11/10/2015, 3:14 AM

## 2015-11-10 NOTE — Progress Notes (Signed)
Occupational Therapy Session Note  Patient Details  Name: [name] MRN: [id number] Date of Birth: 10/13/1955  Today's Date: 11/10/2015 OT Individual Time: 1100-1200 and 1532-1600 OT Individual Time Calculation (min): 60 min and 28 min     Short Term Goals: Week 1:  OT Short Term Goal 1 (Week 1): STG=LTG due to LOS  Skilled Therapeutic Interventions/Progress Updates:     Session 1: Upon entering the room, pt seated in recliner chair with wife present in the room. Pt with no c/o pain and agreeable to OT intervention. Pt ambulating with IV pole and steady assist to stand at sink to shave face. Pt engaged in bathing and dressing from sink level with sit <> stand. Pt requiring min verbal cues to sit during clothing management secondary to increased fall risk. Pt ambulating from room with IV pole towards [place] tower entrance. Pt ambulating up inclined surface and able to navigate back to room with increased time and min verbal questioning cues. Quick release belt donned and pt remained in recliner chair at end of session with call bell and all needed items within reach.   Session 2: Upon entering the room, pt seated in recliner chair with no c/o pain. Pt ambulated in room with IV pole into bathroom to urinate while standing with close supervision for safety during clothing management. Pt ambulated to ADL apartment in the same manner and engaged in bed making task. Pt needing close supervision and min verbal cues for safety with task. Pt needing increased time as well to complete task. Pt taking seated rest break secondary to fatigue and returning back to room in same manner. Pt returning to bed with call bell and all needed items within reach upon exiting the room.   Therapy Documentation Precautions:  Precautions Precautions: Fall Precaution Comments: HOH Restrictions Weight Bearing Restrictions: No General:   Vital Signs:   Pain: Pain Assessment Pain Assessment: No/denies pain Pain  Score: 0-No pain ADL:   Exercises:   Other Treatments:    See Function Navigator for Current Functional Status.   Therapy/Group: Individual Therapy  Gypsy Decant 11/10/2015, 12:54 PM
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
## 2015-11-10 NOTE — Progress Notes (Signed)
Social Work Patient ID: [name], male   DOB: 07-21-1955, 60 y.o.   MRN: [id number]  Team feels pt will be ready for discharge Monday after therapies, checked with MD he is agreeable and Feels he will be medically ready by then. Spoke with pam-PA who reports his IV antibiotics are completed 11/12. Will work on any discharge needs. Pt and wife made aware and are agreeable.

## 2015-11-11 ENCOUNTER — Inpatient Hospital Stay (HOSPITAL_COMMUNITY): Payer: BLUE CROSS/BLUE SHIELD | Admitting: Occupational Therapy

## 2015-11-11 ENCOUNTER — Inpatient Hospital Stay (HOSPITAL_COMMUNITY): Payer: BLUE CROSS/BLUE SHIELD | Admitting: Speech Pathology

## 2015-11-11 ENCOUNTER — Encounter: Payer: Self-pay | Admitting: Otolaryngology

## 2015-11-11 ENCOUNTER — Inpatient Hospital Stay (HOSPITAL_COMMUNITY): Payer: BLUE CROSS/BLUE SHIELD | Admitting: Physical Therapy

## 2015-11-11 NOTE — Progress Notes (Signed)
[facility] PHYSICAL MEDICINE & REHABILITATION     PROGRESS NOTE  Subjective/Complaints:  Pt sitting up in bed this AM about to work with SLP.  He is in good spirits.  He slept well for being in a hospital.  Overall, he states he is doing well.    ROS: Denies CP, SOB, N/V/D.  Objective: Vital Signs: Blood pressure 128/68, pulse 78, temperature 97.9 F (36.6 C), temperature source Oral, resp. rate 18, height 5\' 9"  (1.753 m), weight 79.1 kg (174 lb 6.1 oz), SpO2 99 %. No results found.  Recent Labs  11/09/15 0500  WBC 11.8*  HGB 13.7  HCT 40.6  PLT 481*    Recent Labs  11/09/15 0500  NA 137  K 4.0  CL 104  GLUCOSE 122*  BUN 11  CREATININE 0.98  CALCIUM 9.1   CBG (last 3)  No results for input(s): GLUCAP in the last 72 hours.  Wt Readings from Last 3 Encounters:  11/11/15 79.1 kg (174 lb 6.1 oz)  11/08/15 85.1 kg (187 lb 9.6 oz)  10/17/15 92.4 kg (203 lb 12.8 oz)    Physical Exam:  BP 128/68 (BP Location: Left Arm)   Pulse 78   Temp 97.9 F (36.6 C) (Oral)   Resp 18   Ht 5\' 9"  (1.753 m)   Wt 79.1 kg (174 lb 6.1 oz)   SpO2 99%   BMI 25.75 kg/m  Constitutional: He appears well-developed and well-nourished. NAD. HENT: Normocephalic and atraumatic.  Eyes: EOM are normal. No discharge. Cardiovascular: RRR. No JVD. Respiratory: Effort normal and breath sounds normal. No stridor. He has no wheezes.  GI: Soft. Bowel sounds are normal. He exhibits no distension.  Musculoskeletal: He exhibits no edema, no tenderness.  Neurological: He is alert and oriented.  HOH Mild left facial weakness.  Able to follow basic commands without difficulty.  Motor: LUE/LLE: 4+-5/5 prox to distal  RUE/RLE 4+-5/5 proximal to distal Skin: Skin is warm and dry.  Psychiatric: Relatively normal mood and affect and behavior.  Assessment/Plan: 1. Functional deficits secondary to septicemia and meningitis which require 3+ hours per day of interdisciplinary therapy in a comprehensive  inpatient rehab setting. Physiatrist is providing close team supervision and 24 hour management of active medical problems listed below. Physiatrist and rehab team continue to assess barriers to discharge/monitor patient progress toward functional and medical goals.  Function:  Bathing Bathing position   Position: Standing at sink  Bathing parts Body parts bathed by patient: Right arm, Left arm, Chest, Abdomen, Front perineal area, Buttocks, Left lower leg, Right lower leg, Left upper leg, Right upper leg Body parts bathed by helper: Back  Bathing assist Assist Level: Touching or steadying assistance(Pt > 75%)      Upper Body Dressing/Undressing Upper body dressing   What is the patient wearing?: Pull over shirt/dress     Pull over shirt/dress - Perfomed by patient: Thread/unthread left sleeve, Put head through opening, Pull shirt over trunk Pull over shirt/dress - Perfomed by helper: Thread/unthread right sleeve        Upper body assist Assist Level: Touching or steadying assistance(Pt > 75%)      Lower Body Dressing/Undressing Lower body dressing   What is the patient wearing?: Pants, Socks, Shoes, Underwear Underwear - Performed by patient: Thread/unthread right underwear leg, Thread/unthread left underwear leg, Pull underwear up/down Underwear - Performed by helper: Thread/unthread left underwear leg Pants- Performed by patient: Thread/unthread right pants leg, Thread/unthread left pants leg, Pull pants up/down  Socks - Performed by patient: Don/doff right sock, Don/doff left sock   Shoes - Performed by patient: Don/doff right shoe, Don/doff left shoe, Fasten right, Fasten left            Lower body assist Assist for lower body dressing: Touching or steadying assistance (Pt > 75%)      Toileting Toileting   Toileting steps completed by patient: Adjust clothing prior to toileting, Performs perineal hygiene, Adjust clothing after toileting      Toileting  assist Assist level: Touching or steadying assistance (Pt.75%)   Transfers Chair/bed transfer   Chair/bed transfer method: Ambulatory Chair/bed transfer assist level: Touching or steadying assistance (Pt > 75%) Chair/bed transfer assistive device: Other, Armrests     Locomotion Ambulation     Max distance: 300 Assist level: Touching or steadying assistance (Pt > 75%)   Wheelchair          Cognition Comprehension Comprehension assist level: Follows complex conversation/direction with extra time/assistive device  Expression Expression assist level: Expresses complex ideas: With extra time/assistive device  Social Interaction Social Interaction assist level: Interacts appropriately with others with medication or extra time (anti-anxiety, antidepressant).  Problem Solving Problem solving assist level: Solves complex 90% of the time/cues < 10% of the time  Memory Memory assist level: Recognizes or recalls 90% of the time/requires cueing < 10% of the time     Medical Problem List and Plan: 1.  Functional and cognitive deficits secondary to septicemia and meningitis  Cont CIR  Fungal cultures remain pending on 11/10 2.  DVT Prophylaxis/Anticoagulation: Pharmaceutical: Lovenox 3. Pain Management: Tylenol prn 4. Mood: Mood stable on Wellbutrin XL. LCSW to follow for evaluation and support.  5. Neuropsych: This patient is not fully capable of making decisions on his own behalf. 6. Skin/Wound Care: Routine pressure relief measures.  7. Fluids/Electrolytes/Nutrition: Monitor I/O.  8. Group B strep bacteremia with meningitis:   IV PCN thorough 11/13 per ID 9. BPH s/p TURP: On finasteride as cannot tolerate flomax.  10 Otitis externa: Ciprodex completed 11/8.  11. Leucocytosis:   Continue to monitor for now.   WBCs 11.8 on 11/8 (trending down)  Labs ordered for Monday 12. Encephalopathy: Much improved but continues to poor safety awareness with impulsivity.  13. Right renal mass: To  follow up with Dr. [name] on outpatient basis.  14.MVP: Cont lopressor bid.  15. Hyponatremia: ?Resolved  137 on 11/8  Labs ordered for Monday 16. HTN:   Monitor BP bid with increased activity.   WNL 11/10  LOS (Days) 3 A FACE TO FACE EVALUATION WAS PERFORMED  Mylea Roarty Lorie Phenix 11/11/2015 8:34 AM

## 2015-11-11 NOTE — Progress Notes (Signed)
Physical Therapy Session Note  Patient Details  Name: [name] MRN: [id number] Date of Birth: [birth year]  Today's Date: 11/11/2015 PT Individual Time: 0905-1008 PT Individual Time Calculation (min): 63 min    Short Term Goals: Week 1:  PT Short Term Goal 1 (Week 1): = LTGs due to anticipated LOS  Skilled Therapeutic Interventions/Progress Updates:   Pt received in bed, no c/o pain.  Pt requesting to don clothing.  Performed supine > sit with supervision and donned clothing in sitting and standing with supervision overall.  Performed gait in controlled environment with pt holding IV pole and therapist providing supervision-min A for balance and safety.  Performed stair negotiation training up/down 12 stairs x 3 reps with R rail to simulate negotiation of stairs at home to access second level with supervision-min A with pt self selecting alternating sequence with verbal cues to attend to L foot placement on step when pt distracted with IV or Rabbi coming to visit.  Discussed safety and supervision with stairs at home with wife.  Performed dynamic gait/balance assessment with DGI and over compliant surface with supervision-min A; see below for results.  Performed dynamic balance training in hallway with tandem and retro gait x 25' x 3 reps with LUE support on rails and min A with verbal cues for upright gaze and sequencing and figure 8 gait x 5 reps with min A to focus on changes in direction and balance while pivoting.  Returned to room and pt left in recliner with wife present and all items within reach.  Therapy Documentation Precautions:  Precautions Precautions: Fall Precaution Comments: HOH Restrictions Weight Bearing Restrictions: No Pain: Pain Assessment Pain Assessment: No/denies pain Pain Score: 0-No pain Balance: Standardized Balance Assessment Standardized Balance Assessment: Dynamic Gait Index Dynamic Gait Index Level Surface: Mild Impairment Change in Gait Speed: Mild  Impairment Gait with Horizontal Head Turns: Mild Impairment Gait with Vertical Head Turns: Mild Impairment Gait and Pivot Turn: Normal Step Over Obstacle: Mild Impairment Step Around Obstacles: Normal Steps: Mild Impairment Total Score: 18   See Function Navigator for Current Functional Status.   Therapy/Group: Individual Therapy  Raylene Everts [facility] 11/11/2015, 11:43 AM

## 2015-11-11 NOTE — Progress Notes (Signed)
Speech Language Pathology Discharge Summary  Patient Details  Name: [name] MRN: [id number] Date of Birth: 12/05/1955  Patient has met 3 of 3 long term goals.  Patient to discharge at overall Modified Independent level.   Reasons goals not met: N/A   Clinical Impression/Discharge Summary: Patient has made functional gains and has met 3 of 3 LTG's this admission due to improved cognitive and swallowing function. Currently, patient completes mildly complex and functional tasks with Mod I in regards to problem solving, awareness and safety. Patient is also consuming regular textures with thin liquids without overt s/s of aspiration and is Mod I for use of swallowing compensatory strategies. Patient is at his baseline level of cognitive and swallowing functioning, therefore, patient will be discharged from SLP intervention. Skilled f/u is not warranted at this time. The patient verbalized understanding and agreement.   Recommendation:  None      Equipment: N/A   Reasons for discharge: Treatment goals met   Patient/Family Agrees with Progress Made and Goals Achieved: Yes      , MA, CCC-SLP [phone]   ,  11/11/2015, 10:28 AM    

## 2015-11-11 NOTE — Progress Notes (Addendum)
Occupational Therapy Session Note  Patient Details  Name: [name] MRN: [id number] Date of Birth: 1955-11-14  Today's Date: 11/11/2015 OT Individual Time: 7741-4239 and 5320-2334 OT Individual Time Calculation (min): 57 min and 32 minutes    Short Term Goals: Week 1:  OT Short Term Goal 1 (Week 1): STG=LTG due to LOS  Skilled Therapeutic Interventions/Progress Updates:   Skilled OT session completed with focus on balance, L UE strength and coordination, and postural awareness. Pt was received in recliner at time of arrival and agreeable to tx in gym. Pt ambulated with IV pole and Min guard-supervision down hallway. Graded biodex activities completed with pt improving weight shifting/postural control with repetition and extra time. Scores improved from -10-15% to 82-93% on limits of stability and maze. Afterwards pt completed L UE task while standing with use of geometric shapes with emphasis on in hand manipulation. Pt exhibits difficultly with isolating finger movements but met activity demands with extra time and encouragement. L UE exercises completed with 3# dumbbell at [facility] with instruction on technique and visual biofeedback to attend to posture due to pts right lean. At end of tx, pt returned to room in manner as written above and transferred back to recliner. All needs within reach at time of departure.   2nd Session 1:1 Tx (32 minutes) Skilled OT session completed with focus on standing endurance and activity tolerance. Pt was received in recliner at time of arrival. Pt ambulated to brain injury gym to complete dynavision activity, bending and reaching out of base of support while standing for 10 minute intervals before requiring rest breaks. Pt then ambulated back to room and toileted with supervision. Cuing required for bringing IV pole due to pt frequently forgetting. Pt returned to bed and was left with all needs within reach and bed alarm activated.   Therapy  Documentation Precautions:  Precautions Precautions: Fall Precaution Comments: HOH Restrictions Weight Bearing Restrictions: No   Pain: No c/o pain during sessions Pain Assessment Pain Assessment: No/denies pain Pain Score: 0-No pain ADL:   Exercises: L UE therapeutic exercise with 3# dumbbell x10 reps 1 set: shoulder flexion, bicep curls, overhead punches, pronation/supination, wrist flexion/extension, body cross curls       See Function Navigator for Current Functional Status.   Therapy/Group: Individual Therapy  [name] A Markese Bloxham 11/11/2015, 12:32 PM

## 2015-11-11 NOTE — Progress Notes (Signed)
Speech Language Pathology Daily Session Note  Patient Details  Name: [name] MRN: [id number] Date of Birth: 11-19-1955  Today's Date: 11/11/2015 SLP Individual Time: 0730-0827 SLP Individual Time Calculation (min): 57 min   Short Term Goals: Week 1: SLP Short Term Goal 1 (Week 1): Patient will consume regular textures and thin liquids with no overt s/s of aspiration with Mod I.  SLP Short Term Goal 2 (Week 1): Patient will demonstrate moderately to highly complex functional problem solving with Mod I.  SLP Short Term Goal 3 (Week 1): Patient will demonstrate anticipatory awareness of compensatory strategies/safety needs during daily activities given Mod I.   Skilled Therapeutic Interventions:Skilled therapy intervention focused on cognitive and dysphagia goals. Patient Mod I for problem-solving during a novel card task when provided with written instructions and parameters. Patient observed with breakfast meal of regular textures and thin liquids from a straw with no overt s/s of aspiration. Patient left upright in bed with all needs within reach. As cognitive and swallowing goals have been met at this time, recommending discharge from Speech Therapy services.   Function:  Eating Eating   Modified Consistency Diet: No Eating Assist Level: No help, No cues           Cognition Comprehension Comprehension assist level: Follows complex conversation/direction with extra time/assistive device  Expression   Expression assist level: Expresses complex ideas: With extra time/assistive device  Social Interaction Social Interaction assist level: Interacts appropriately with others - No medications needed.  Problem Solving Problem solving assist level: Solves complex problems: With extra time  Memory Memory assist level: Complete Independence: No helper    Pain Pain Assessment Pain Assessment: No/denies pain Pain Score: 0-No pain  Therapy/Group: Individual Therapy  [name] 11/11/2015, 11:07 AM

## 2015-11-11 NOTE — Progress Notes (Signed)
11-11-15 Otology Consult Note  Asked to see patient by Dr. Mertha Finders for evaluation of hearing loss and R otorrhea.  60 y/o WM admitted with sepsis and encephalopathy. Complains of decreased hearing in both ears without tinnitus, otalgia or vertigo. Some R crusted drainage reported from both ear canals. Reports significant decrease in hearing since he was admitted to the hospital.  Patient is well known to me. Treated for acute right myringitis in 09/1990. Treated for  a fissure of his right EAC in 3/95. Treated for acute R myringitis 12/95. 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 Left ear had and SRT = 15 dB with 100% WRS at 55 dB HTL.  CT scan of head on 10-29-15 showed well pneumatized mastoids without effusions and clear middle ear spaces AU. An MRI brain scan on 11-1-7 shows bilateral mastoid effusions.  PE: Patient was awake and alert and complaining of decreased hearing AU. Facial function was intact and symmetric.  Right ear: crusted drainage R concha with debris removed from the R EAC. TM is retracted with right middle ear effusion.   Left ear: dense cerumen impaction AS, softened with H2O2 but unable to remove with available instruments.   Tuning fork testing at the bedside: Neg Rinne's bilaterally, Weber lateralized to the right ear.  IMP: 1. I do not think that the patient's sepsis is secondary to his ears or mastoids as both mastoids and MEs were clear on the 10-29-15 CT scan. 2. Hearing improved slightly AD with canal cleaning.   PLAN; 1. Will need to debride the L EAC in my office and test his hearing. He may require a UMT AD in the office. 2. Patient expected to be DC'ed on 11-14-15. He has an appointment to see me on Wednesday, 11-16-15.

## 2015-11-12 ENCOUNTER — Inpatient Hospital Stay (HOSPITAL_COMMUNITY): Payer: BLUE CROSS/BLUE SHIELD | Admitting: Occupational Therapy

## 2015-11-12 MED ORDER — WHITE PETROLATUM GEL
Status: AC
  Administered 2015-11-12: 15:00:00
  Filled 2015-11-12: qty 1

## 2015-11-12 NOTE — Progress Notes (Signed)
Occupational Therapy Session Note  Patient Details  Name: [name] MRN: [id number] Date of Birth: [birth year]  Today's Date: 11/12/2015 OT Individual Time: 1500-1530 OT Individual Time Calculation (min): 30 min   Short Term Goals:Week 1:  OT Short Term Goal 1 (Week 1): STG=LTG due to LOS  Skilled Therapeutic Interventions/Progress Updates:    1:1 OT session focused on standing balance and attention/awareness during functional mobility. Pt met in room, declined need for bathroom and ambulated to therapy gym. Pt took seated rest break. Pt completed further functional mobility with OT facilitating balance and attention with conversation and scavenger hunt in hallway. Pt often veered to L and required cues not to bump into objects on L side. Pt returned to room at end of session noted to be incontinent of bladder which pt was not aware of. Pt needs RN to assist with donning/doffing shirt 2/2 continuous IV and RN notified. Pt left seated in recliner with spouse present.   Therapy Documentation Precautions:  Precautions Precautions: Fall Precaution Comments: HOH Restrictions Weight Bearing Restrictions: No Pain: Pain Assessment Pain Assessment: No/denies pain  See Function Navigator for Current Functional Status.   Therapy/Group: Individual Therapy  Valma Cava 11/12/2015, 3:36 PM

## 2015-11-12 NOTE — Progress Notes (Signed)
[facility] PHYSICAL MEDICINE & REHABILITATION     PROGRESS NOTE  Subjective/Complaints:  No issues overnight, looking forward to discharge  ROS: Denies CP, SOB, N/V/D.  Objective: Vital Signs: Blood pressure (!) 143/84, pulse 87, temperature 98.3 F (36.8 C), temperature source Axillary, resp. rate 18, height 5\' 9"  (1.753 m), weight 81.6 kg (179 lb 14.3 oz), SpO2 98 %. No results found. No results for input(s): WBC, HGB, HCT, PLT in the last 72 hours. No results for input(s): NA, K, CL, GLUCOSE, BUN, CREATININE, CALCIUM in the last 72 hours.  Invalid input(s): CO CBG (last 3)  No results for input(s): GLUCAP in the last 72 hours.  Wt Readings from Last 3 Encounters:  11/12/15 81.6 kg (179 lb 14.3 oz)  11/08/15 85.1 kg (187 lb 9.6 oz)  10/17/15 92.4 kg (203 lb 12.8 oz)    Physical Exam:  BP (!) 143/84   Pulse 87   Temp 98.3 F (36.8 C) (Axillary)   Resp 18   Ht 5\' 9"  (1.753 m)   Wt 81.6 kg (179 lb 14.3 oz)   SpO2 98%   BMI 26.57 kg/m  Constitutional: He appears well-developed and well-nourished. NAD. HENT: Normocephalic and atraumatic.  Eyes: EOM are normal. No discharge. Cardiovascular: RRR. No JVD. Respiratory: Effort normal and breath sounds normal. No stridor. He has no wheezes.  GI: Soft. Bowel sounds are normal. He exhibits no distension.  Musculoskeletal: He exhibits no edema, no tenderness.  Neurological: He is alert and oriented.  HOH Mild left facial weakness.  Able to follow basic commands without difficulty.  Motor: LUE/LLE: 4+-5/5 prox to distal  RUE/RLE 4+-5/5 proximal to distal Skin: Skin is warm and dry.  Psychiatric: Relatively normal mood and affect and behavior.  Assessment/Plan: 1. Functional deficits secondary to septicemia and meningitis which require 3+ hours per day of interdisciplinary therapy in a comprehensive inpatient rehab setting. Physiatrist is providing close team supervision and 24 hour management of active medical problems  listed below. Physiatrist and rehab team continue to assess barriers to discharge/monitor patient progress toward functional and medical goals.  Function:  Bathing Bathing position   Position: Standing at sink  Bathing parts Body parts bathed by patient: Right arm, Left arm, Chest, Abdomen, Front perineal area, Buttocks, Left lower leg, Right lower leg, Left upper leg, Right upper leg Body parts bathed by helper: Back  Bathing assist Assist Level: Touching or steadying assistance(Pt > 75%)      Upper Body Dressing/Undressing Upper body dressing   What is the patient wearing?: Pull over shirt/dress     Pull over shirt/dress - Perfomed by patient: Thread/unthread left sleeve, Put head through opening, Pull shirt over trunk Pull over shirt/dress - Perfomed by helper: Thread/unthread right sleeve        Upper body assist Assist Level: Touching or steadying assistance(Pt > 75%)      Lower Body Dressing/Undressing Lower body dressing   What is the patient wearing?: Pants, Socks, Shoes, Underwear Underwear - Performed by patient: Thread/unthread right underwear leg, Thread/unthread left underwear leg, Pull underwear up/down Underwear - Performed by helper: Thread/unthread left underwear leg Pants- Performed by patient: Thread/unthread right pants leg, Thread/unthread left pants leg, Pull pants up/down       Socks - Performed by patient: Don/doff right sock, Don/doff left sock   Shoes - Performed by patient: Don/doff right shoe, Don/doff left shoe, Fasten right, Fasten left            Lower body assist Assist for  lower body dressing: Touching or steadying assistance (Pt > 75%)      Toileting Toileting   Toileting steps completed by patient: Adjust clothing prior to toileting, Performs perineal hygiene, Adjust clothing after toileting Toileting steps completed by helper: Adjust clothing prior to toileting, Adjust clothing after toileting (per Berkley Harvey, NT)    Toileting  assist Assist level: Supervision or verbal cues   Transfers Chair/bed transfer   Chair/bed transfer method: Ambulatory Chair/bed transfer assist level: Supervision or verbal cues Chair/bed transfer assistive device: Armrests     Locomotion Ambulation     Max distance: 300 Assist level: Touching or steadying assistance (Pt > 75%)   Wheelchair Wheelchair activity did not occur: N/A        Cognition Comprehension Comprehension assist level: Follows complex conversation/direction with extra time/assistive device  Expression Expression assist level: Expresses complex ideas: With extra time/assistive device  Social Interaction Social Interaction assist level: Interacts appropriately with others - No medications needed.  Problem Solving Problem solving assist level: Solves complex problems: With extra time  Memory Memory assist level: Complete Independence: No helper     Medical Problem List and Plan: 1.  Functional and cognitive deficits secondary to septicemia and meningitis  Cont CIR  Fungal cultures remain pending on 11/10 2.  DVT Prophylaxis/Anticoagulation: Pharmaceutical: Lovenox 3. Pain Management: Tylenol prn 4. Mood: Mood stable on Wellbutrin XL. LCSW to follow for evaluation and support.  5. Neuropsych: This patient is not fully capable of making decisions on his own behalf. 6. Skin/Wound Care: Routine pressure relief measures.  7. Fluids/Electrolytes/Nutrition: Monitor I/O.  8. Group B strep bacteremia with meningitis:   IV PCN thorough 11/13 per ID 9. BPH s/p TURP: On finasteride as cannot tolerate flomax.  10 Otitis externa: Ciprodex completed 11/8.  11. Leucocytosis:   Continue to monitor for now.   WBCs 11.8 on 11/8 (trending down)  Labs ordered for Monday 12. Encephalopathy: Much improved but continues to poor safety awareness with impulsivity.  13. Right renal mass: To follow up with Dr. [name] on outpatient basis.  14.MVP: Cont lopressor bid.  15.  Hyponatremia: ?Resolved  137 on 11/8  Labs ordered for Monday 16. HTN:    Vitals:   11/12/15 0515 11/12/15 0835  BP: (!) 150/77 (!) 143/84  Pulse: 74 87  Resp: 18   Temp: 98.3 F (36.8 C)    LOS (Days) 4 A FACE TO FACE EVALUATION WAS PERFORMED  KIRSTEINS,ANDREW E 11/12/2015 11:00 AM

## 2015-11-12 NOTE — Progress Notes (Signed)
Patient A/O. No noted distress or pain. IV ABT administered tolerated well. Patient participated in therapy. OOB in chair most of day. Patient has been in a good mood. MD told patient he had cancer, spouse is upset on how the MD delivered the information to the patient. Patient is upset, as well. Patient may need sleeping aid to help him sleep. Staff will continue to assess and meet needs. Spouse continues to be at bedside with him.

## 2015-11-13 ENCOUNTER — Inpatient Hospital Stay (HOSPITAL_COMMUNITY): Payer: BLUE CROSS/BLUE SHIELD | Admitting: Physical Therapy

## 2015-11-13 ENCOUNTER — Inpatient Hospital Stay (HOSPITAL_COMMUNITY): Payer: BLUE CROSS/BLUE SHIELD | Admitting: Occupational Therapy

## 2015-11-13 NOTE — Progress Notes (Signed)
Physical Therapy Session Note  Patient Details  Name: Nashaun Viverito MRN: [id number] Date of Birth: 1955-08-15  Today's Date: 11/13/2015 PT Individual Time: 0900-1000 and 1345-1423 PT Individual Time Calculation (min): 60 min and 38 min   Short Term Goals: Week 1:  PT Short Term Goal 1 (Week 1): = LTGs due to anticipated LOS  Skilled Therapeutic Interventions/Progress Updates:    Treatment 1: Patient in recliner reporting not having a good day, encouragement provided throughout session and patient updated on progress toward goals and DC planned home tomorrow after therapies. Session focused on reassessment of Berg Balance Scale with score of 50/56 improved from 44/56 on evaluation, gait training with IV pole throughout rehab unit with supervision, stair training up/down 24 (6") stairs without rails to simulate home entry with initial steady assist and verbal cues for step-to pattern ascending and decreased speed for safety on first 8 steps progressed to supervision for remaining steps, static standing with one LE on soccer ball for improved single limb balance and strengthening progressed to rolling soccer ball forward/backward and side to side, alternating LE's with steady assist overall, gait throughout rehab unit while dribbling soccer ball to self on carpet and tile surfaces, and Biodex training for limits of stability with multimodal cues for weight shifting technique instead of leading with BUE support on rails or head. Patient toileted in room with mod I and left sitting in recliner with all needs within reach.    Treatment 2: Patient in recliner with wife departing. Gait training to and from gym x 2 holding IV pole with supervision. Patient toileted with mod I. Performed NuStep using BUE/BLE at level 5 x 10 min for generalized strengthening and endurance. Engaged in Immunologist for standing balance retraining. Patient requesting to return to room after visitor arrived, left sitting in recliner  with all needs within reach.   Therapy Documentation Precautions:  Precautions Precautions: Fall Precaution Comments: HOH Restrictions Weight Bearing Restrictions: No Pain: Pain Assessment Pain Assessment: No/denies pain Balance: Balance Balance Assessed: Yes Standardized Balance Assessment Standardized Balance Assessment: Berg Balance Test Berg Balance Test Sit to Stand: Able to stand without using hands and stabilize independently Standing Unsupported: Able to stand safely 2 minutes Sitting with Back Unsupported but Feet Supported on Floor or Stool: Able to sit safely and securely 2 minutes Stand to Sit: Sits safely with minimal use of hands Transfers: Able to transfer safely, minor use of hands Standing Unsupported with Eyes Closed: Able to stand 10 seconds safely Standing Ubsupported with Feet Together: Able to place feet together independently and stand 1 minute safely From Standing, Reach Forward with Outstretched Arm: Can reach confidently >25 cm (10") From Standing Position, Pick up Object from Floor: Able to pick up shoe safely and easily From Standing Position, Turn to Look Behind Over each Shoulder: Looks behind from both sides and weight shifts well Turn 360 Degrees: Able to turn 360 degrees safely but slowly Standing Unsupported, Alternately Place Feet on Step/Stool: Able to stand independently and safely and complete 8 steps in 20 seconds Standing Unsupported, One Foot in Front: Able to plae foot ahead of the other independently and hold 30 seconds Standing on One Leg: Tries to lift leg/unable to hold 3 seconds but remains standing independently Total Score: 50/56  See Function Navigator for Current Functional Status.   Therapy/Group: Individual Therapy  Laretta Alstrom 11/13/2015, 10:01 AM

## 2015-11-13 NOTE — Progress Notes (Signed)
Occupational Therapy Session Note  Patient Details  Name: [name] MRN: [id number] Date of Birth: December 16, 1955  Today's Date: 11/13/2015 OT Individual Time: 0700-0800 and 1130-1200  OT Individual Time Calculation (min): 60 min and 30 min    Short Term Goals:Week 1:  OT Short Term Goal 1 (Week 1): STG=LTG due to LOS  Skilled Therapeutic Interventions/Progress Updates:    Session One: Pt seen for OT session focusing on functional transfers/ ambulation and general strengthening/ activity tolerance. Pt sitting up in recliner upon arrival, he denied pain and was agreeable to tx session. He declined bathing/dressing this morning. He ambulated throughout unit with supervision, occasional VCs required to remember to bring IV pole.  In ADL apartment, pt completed simulated shower stall transfer, supervision- mod I. Pt reports his wife is getting him a shower chair for home use.  In kitchen, he obtaining/ and returned items from overhead cabinet in simulation of simple meal task as well as for general strengthening/ coordination as pt required to use L UE to complete task. In therapy gym, pt completed floor transfer supervision- mod I level following VCs for technique. Reviewed what to do in case of fall, including calling 911 if any injury is suspected.  On therapy mat, completed tall kneeling task and then same task completed in quadraped for core strengthening and general stability. He ambulated once throughout unit required to balance tennis ball on racket addressing divided attention in stimulating environment.  On return to room, pt required to bounce basketball while ambulating, required close supervision and VCs for awareness. Pt returned to room and left seated in recliner with all needs in reach.   Session Two: Pt seen for OT session focusing on ADL re-training and functional standing balance/ ambulation. Pt sitting up in recliner upon arrival with his wife present though she did not  attend tx session. He ambulated into bathroom, requiring VC to remember IV pole. He completed standing toileting task mod I. He ambulated to therapy gym, and completed ball toss activity off slanted trampoline while standing on non-compliant surface. Required min A for dynamic balance.  He then completed obstacle course in hallway requiring him to step over cones. He required increased assist when required to step horizontally over cones, not fully clearing 3 cones. He ambulated back to room at end of session, left seated in recliner set-up with lunch with all needs in reach, wife present. Reviewed with wife OT/PT goals, DME, and d/c planning.    Therapy Documentation Precautions:  Precautions Precautions: Fall Precaution Comments: HOH Restrictions Weight Bearing Restrictions: No  See Function Navigator for Current Functional Status.   Therapy/Group: Individual Therapy  Lewis, Annajulia Lewing C 11/13/2015, 6:43 AM

## 2015-11-14 ENCOUNTER — Inpatient Hospital Stay (HOSPITAL_COMMUNITY): Payer: BLUE CROSS/BLUE SHIELD | Admitting: Occupational Therapy

## 2015-11-14 ENCOUNTER — Inpatient Hospital Stay (HOSPITAL_COMMUNITY): Payer: BLUE CROSS/BLUE SHIELD | Admitting: Physical Therapy

## 2015-11-14 MED ORDER — FINASTERIDE 5 MG PO TABS: 5.0000 mg | ORAL_TABLET | Freq: Every day | ORAL | 0 refills | Status: DC

## 2015-11-14 MED ORDER — METOPROLOL TARTRATE 25 MG PO TABS: 25.0000 mg | ORAL_TABLET | Freq: Two times a day (BID) | ORAL | 0 refills | Status: DC

## 2015-11-14 MED ORDER — BENZONATATE 100 MG PO CAPS: 100.0000 mg | ORAL_CAPSULE | Freq: Two times a day (BID) | ORAL | 0 refills | Status: DC | PRN

## 2015-11-14 NOTE — Discharge Summary (Signed)
Physician Discharge Summary  Patient ID: [name] MRN: [id number] DOB/AGE: 59-Jan-1957 59 y.o.  Admit date: 11/08/2015 Discharge date: 11/14/2015  Discharge Diagnoses:  Principal Problem:   Debility Active Problems:   Meningitis   Lymphocytosis   Secondary hypertension   Infective otitis externa   Encephalopathy   Discharged Condition: stable.   Significant Diagnostic Studies: N/A   Labs:  Basic Metabolic Panel:  Recent Labs Lab 11/09/15 0500  NA 137  K 4.0  CL 104  CO2 22  GLUCOSE 122*  BUN 11  CREATININE 0.98  CALCIUM 9.1    CBC:  Recent Labs Lab 11/09/15 0500  WBC 11.8*  NEUTROABS 8.3*  HGB 13.7  HCT 40.6  MCV 87.5  PLT 481*    CBG: No results for input(s): GLUCAP in the last 168 hours.  Brief HPI:    Maurice Sperlingis a 60 y.o.malewith history of MV endocarditis and embolic stroke [id number],  BPH s/p TURP two days PTA on 10/29/15 with fall, confusion, agitation and B/B incontinence. 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  TEE negative for endocarditis, no thrombus or PFO. Dr. [name] felt that GBS bacteremia due to recent procedure or immunosuppression from occult malignancy and recommended 2 weeks total after last negative BC. Neurology consulted due to ongoing issues with encephalopahty and agitation and LP done showing evidence of meningitis. CSF culture negative and  MRI brain done showing ares of layering diffusion restriction concerning for ventriculitis and Abnormal DWI intensity in both frontal lobes question sequelae of remote hemorrhage or leptomeningeal abnormality.  Drainage from ear due to otitis externa treated has improved with addition of ciprodex. He is to follow up with Dr. [name] for workup of renal mass/nephrectomy. Mentation was improving but therapy revealed balance deficits  and poor safety awareness.  CIR was recommended for follow up therapy.    Hospital Course: [name] was admitted to rehab 11/08/2015 for inpatient therapies to consist of PT, ST and OT at least three hours five days a week. Past admission physiatrist, therapy team and rehab RN have worked together to provide customized collaborative inpatient rehab. Blood pressures have been stable and heart rate controlled on metoprolol. Po intake has been good and urgency is resolving. He was maintained on PCN thorough 11/12 and completed antibiotic regimen without SE.   Reactive leucocytosis is resolving and he has been afebrile during his stay. Hyponatremia has resolved. He continued to complain of hearing loss and was evaluated by Dr. Thornell Mule on 11/10. Exam revealed dense cerumen impaction in left ear and right TM to be retracted with middle ear effusion. He is to follow up with ENT on Wed for debridement and hearing test after discharge. His mentation has greatly improved and is has progressed to modified independent level at discharge. He will continue to receive follow up HHPT by [facility] after discharge.    Rehab course: During patient's stay in rehab weekly team conferences were held to monitor patient's progress, set goals and discuss barriers to discharge. He displaced mild higher level cognitive deficits and required min assist for mobility and self care tasks. He has had improvement in activity tolerance, balance, postural control, as well as ability to compensate for deficits. He was educated on compensatory techniques for swallow and memory and ST signed off 11/10 as patient was back  to baseline.  He is able to complete ADL tasks and ambulate 300' without AD at modified independent level. He requires supervision for shower transfers and for mobility out of home setting. Family education was done regarding all aspects of care with wife.    Disposition:  Home   Diet:  Regular.   Special  Instructions: 1. No driving till cleared by MD. 2. Needs supervision out of home setting.    Discharge Instructions    Ambulatory referral to Infectious Disease    Complete by:  As directed    3-4 week follow up after bacteremia   Ambulatory referral to Physical Medicine Rehab    Complete by:  As directed    1-2 week follow up/debility due to bacteremia        Medication List    STOP taking these medications   buPROPion 150 MG 24 hr tablet Commonly known as:  WELLBUTRIN XL   ciprofloxacin-dexamethasone otic suspension Commonly known as:  CIPRODEX   penicillin G potassium 4 Million Units in dextrose 5 % 250 mL     TAKE these medications   benzonatate 100 MG capsule Commonly known as:  TESSALON Take 1 capsule (100 mg total) by mouth 2 (two) times daily as needed for cough.   diphenhydramine-acetaminophen 25-500 MG Tabs tablet Commonly known as:  TYLENOL PM Take 2 tablets by mouth at bedtime.   finasteride 5 MG tablet Commonly known as:  PROSCAR Take 1 tablet (5 mg total) by mouth daily.   metoprolol tartrate 25 MG tablet Commonly known as:  LOPRESSOR Take 1 tablet (25 mg total) by mouth 2 (two) times daily.   multivitamin with minerals Tabs tablet Take 1 tablet by mouth daily.   rosuvastatin 10 MG tablet Commonly known as:  CRESTOR Take 1 tablet (10 mg total) by mouth daily.   VITAMIN D PO Take 1 tablet by mouth daily.      Follow-up Information    Ankit Lorie Phenix, MD Follow up.   Specialty:  Physical Medicine and Rehabilitation Why:  office will call you for follow up appointment Contact information: [address] [place] [address] [phone]        Carlyle Basques, MD. Call.   Specialty:  Infectious Diseases Why:  for follow up in a month Contact information: [place] [address] [phone]        Ailene Rud, MD. Call.   Specialty:  Urology Why:  for follow up  appointment Contact information: [place] [address] [phone]        Henrine Screws, MD Follow up on 11/23/2015.   Specialty:  Internal Medicine Why:  @ 11:45 AM Contact information: [address] [place] [address] [phone]        Fannie Knee, MD. Call in 1 day(s).   Specialty:  Otolaryngology Why:  for info on appt on Wed Contact information: [place], [place] [address] [phone]           Signed: Bary Leriche 11/14/2015, 3:44 PM

## 2015-11-14 NOTE — Progress Notes (Addendum)
[facility]IC[facility]ILITATION     PROGRESS NOTE  Subjective/Complaints:  Pt seen sitting up in bed this AM.  He had a good weekend and is ready for discharge today.    ROS: Denies CP, SOB, N/V/D.  Objective: Vital Signs: Blood pressure 122/76, pulse 78, temperature 98.1 F (36.7 C), temperature source Oral, resp. rate 16, height 5\' 9"  (1.753 m), weight 85 kg (187 lb 6.3 oz), SpO2 100 %. No results found. No results for input(s): WBC, HGB, HCT, PLT in the last 72 hours. No results for input(s): NA, K, CL, GLUCOSE, BUN, CREATININE, CALCIUM in the last 72 hours.  Invalid input(s): CO CBG (last 3)  No results for input(s): GLUCAP in the last 72 hours.  Wt Readings from Last 3 Encounters:  11/14/15 85 kg (187 lb 6.3 oz)  11/08/15 85.1 kg (187 lb 9.6 oz)  10/17/15 92.4 kg (203 lb 12.8 oz)    Physical Exam:  BP 122/76 (BP Location: Left Arm)   Pulse 78   Temp 98.1 F (36.7 C) (Oral)   Resp 16   Ht 5\' 9"  (1.753 m)   Wt 85 kg (187 lb 6.3 oz)   SpO2 100%   BMI 27.67 kg/m  Constitutional: He appears well-developed and well-nourished. NAD. HENT: Normocephalic and atraumatic.  Eyes: EOM are normal. No discharge. Cardiovascular: RRR. No JVD. Respiratory: Effort normal and breath sounds normal. He has no wheezes.  GI: Soft. Bowel sounds are normal. He exhibits no distension.  Musculoskeletal: He exhibits no edema, no tenderness.  Neurological: He is alert and oriented.  HOH Mild left facial weakness.  Able to follow basic commands without difficulty.  Motor: LUE/LLE: 5/5 prox to distal  RUE/RLE 5/5 proximal to distal Skin: Skin is warm and dry.  Psychiatric: Normal mood and affect and behavior.  Assessment/Plan: 1. Functional deficits secondary to septicemia and meningitis which require 3+ hours per day of interdisciplinary therapy in a comprehensive inpatient rehab setting. Physiatrist is providing close team supervision and 24 hour management of active  medical problems listed below. Physiatrist and rehab team continue to assess barriers to discharge/monitor patient progress toward functional and medical goals.  Function:  Bathing Bathing position   Position: Standing at sink  Bathing parts Body parts bathed by patient: Right arm, Left arm, Chest, Abdomen, Front perineal area, Buttocks, Left lower leg, Right lower leg, Left upper leg, Right upper leg Body parts bathed by helper: Back  Bathing assist Assist Level: Touching or steadying assistance(Pt > 75%)      Upper Body Dressing/Undressing Upper body dressing   What is the patient wearing?: Pull over shirt/dress     Pull over shirt/dress - Perfomed by patient: Thread/unthread left sleeve, Put head through opening, Pull shirt over trunk Pull over shirt/dress - Perfomed by helper: Thread/unthread right sleeve        Upper body assist Assist Level: Touching or steadying assistance(Pt > 75%)      Lower Body Dressing/Undressing Lower body dressing   What is the patient wearing?: Pants, Socks, Shoes, Underwear Underwear - Performed by patient: Thread/unthread right underwear leg, Thread/unthread left underwear leg, Pull underwear up/down Underwear - Performed by helper: Thread/unthread left underwear leg Pants- Performed by patient: Thread/unthread right pants leg, Thread/unthread left pants leg, Pull pants up/down       Socks - Performed by patient: Don/doff right sock, Don/doff left sock   Shoes - Performed by patient: Don/doff right shoe, Don/doff left shoe, Fasten right, Fasten left  Lower body assist Assist for lower body dressing: Touching or steadying assistance (Pt > 75%)      Toileting Toileting   Toileting steps completed by patient: Adjust clothing prior to toileting, Performs perineal hygiene, Adjust clothing after toileting Toileting steps completed by helper: Adjust clothing prior to toileting, Adjust clothing after toileting (per Berkley Harvey,  NT)    Toileting assist Assist level: Supervision or verbal cues   Transfers Chair/bed transfer   Chair/bed transfer method: Ambulatory Chair/bed transfer assist level: No Help, no cues, assistive device, takes more than a reasonable amount of time Chair/bed transfer assistive device: Armrests     Locomotion Ambulation     Max distance: 300 Assist level: Supervision or verbal cues   Wheelchair Wheelchair activity did not occur: N/A        Cognition Comprehension Comprehension assist level: Follows complex conversation/direction with extra time/assistive device  Expression Expression assist level: Expresses complex ideas: With extra time/assistive device  Social Interaction Social Interaction assist level: Interacts appropriately with others - No medications needed.  Problem Solving Problem solving assist level: Solves complex problems: With extra time  Memory Memory assist level: Complete Independence: No helper     Medical Problem List and Plan: 1.  Functional and cognitive deficits secondary to septicemia and meningitis  D/c today after abx  Will see pt for transitional care management in 1-2 weeks.  Fungal cultures remain pending on 11/13 2.  DVT Prophylaxis/Anticoagulation: Pharmaceutical: Lovenox 3. Pain Management: Tylenol prn 4. Mood: Mood stable on Wellbutrin XL. LCSW to follow for evaluation and support.  5. Neuropsych: This patient is capable of making decisions on his own behalf. 6. Skin/Wound Care: Routine pressure relief measures.  7. Fluids/Electrolytes/Nutrition: Monitor I/O.  8. Group B strep bacteremia with meningitis:   IV PCN thorough 11/13 per ID 9. BPH s/p TURP: On finasteride as cannot tolerate flomax.  10 Otitis externa: Ciprodex completed 11/8.  11. Leucocytosis:   Continue to monitor for now.   WBCs 11.8 on 11/8 (trending down)  Labs pending for today 12. Encephalopathy: Much improved but continues to poor safety awareness with impulsivity.   13. Right renal mass: To follow up with Dr. [name] on outpatient basis.  14.MVP: Cont lopressor bid.  15. Hyponatremia: ?Resolved  137 on 11/8  Labs pending for today 16. HTN:   Vitals:   11/13/15 1327 11/14/15 0538  BP: 139/77 122/76  Pulse: 82 78  Resp: 18 16  Temp: 97.7 F (36.5 C) 98.1 F (36.7 C)   LOS (Days) 6 A FACE TO FACE EVALUATION WAS PERFORMED  Falicia Lizotte Lorie Phenix 11/14/2015 8:14 AM

## 2015-11-14 NOTE — Discharge Instructions (Signed)
Inpatient Rehab Discharge Instructions  [name] Discharge date and time:  11/14/15  Activities/Precautions/ Functional Status: Activity: activity as tolerated Diet: regular diet Wound Care: none needed   Functional status:  ___ No restrictions     ___ Walk up steps independently ___ 24/7 supervision/assistance   ___ Walk up steps with assistance _X__ Intermittent supervision/assistance  _X__ Bathe/dress independently ___ Walk with walker     ___ Bathe/dress with supervision ___ Walk Independently    ___ Shower independently ___ Walk with assistance    ___ Shower with assistance _X__ No alcohol     ___ Return to work/school ________   COMMUNITY REFERRALS UPON DISCHARGE:   Home Health:   PT       Agency:  [facility]: [phone]   Special Instructions:   My questions have been answered and I understand these instructions. I will adhere to these goals and the provided educational materials after my discharge from the hospital.  Patient/Caregiver Signature _______________________________ Date __________  Clinician Signature _______________________________________ Date __________  Please bring this form and your medication list with you to all your follow-up doctor's appointments.

## 2015-11-14 NOTE — Progress Notes (Signed)
Physical Therapy Discharge Summary  Patient Details  Name: [name] MRN: [id number] Date of Birth: Sep 28, 1955  Today's Date: 11/14/2015 PT Individual Time: 0800-0855 PT Individual Time Calculation (min): 55 min     Patient has met 9 of 9 long term goals due to improved activity tolerance, improved balance, improved postural control, increased strength, ability to compensate for deficits and functional use of  right upper extremity, right lower extremity, left upper extremity and left lower extremity.  Patient to discharge at an ambulatory level Modified Independent in the home, S for stairs and community mobility.   Patient's care partner is independent to provide the necessary physical assistance at discharge.  Reasons goals not met: All goals met  Recommendation:  Patient will benefit from ongoing skilled PT services in home health setting to continue to advance safe functional mobility, address ongoing impairments in activity tolerance, balance, strength, and minimize fall risk.  Equipment: No equipment provided  Reasons for discharge: treatment goals met and discharge from hospital  Patient/family agrees with progress made and goals achieved: Yes  PT Discharge Precautions/RestrictionsPrecautions Precautions: Fall Precaution Comments: HOH Restrictions Weight Bearing Restrictions: No Vital Signs Therapy Vitals Temp: 98.1 F (36.7 C) Temp Source: Oral Pulse Rate: 78 Resp: 16 BP: 122/76 Patient Position (if appropriate): Lying Oxygen Therapy SpO2: 100 % O2 Device: Not Delivered Pain Pain Assessment Pain Assessment: No/denies pain Vision/Perception    WFL Cognition Overall Cognitive Status: Within Functional Limits for tasks assessed Arousal/Alertness: Awake/alert Orientation Level: Oriented X4 Attention: Sustained Sustained Attention: Appears intact Memory: Appears intact Awareness: Appears intact Problem Solving: Appears intact Safety/Judgment: Appears  intact Sensation Sensation Light Touch: Appears Intact Stereognosis: Not tested Hot/Cold: Not tested Proprioception: Appears Intact Coordination Gross Motor Movements are Fluid and Coordinated: Yes Fine Motor Movements are Fluid and Coordinated: Yes Heel Shin Test: WFL BLE Motor  Motor Motor: Within Functional Limits Motor - Discharge Observations: Generalized weakness  Mobility Bed Mobility Bed Mobility: Sit to Supine;Supine to Sit Supine to Sit: 6: Modified independent (Device/Increase time);HOB flat Sit to Supine: HOB flat;6: Modified independent (Device/Increase time) Transfers Stand to Sit: 6: Modified independent (Device/Increase time) Stand Pivot Transfers: 6: Modified independent (Device/Increase time) Locomotion  Ambulation Ambulation: Yes Ambulation/Gait Assistance: 6: Modified independent (Device/Increase time) Ambulation Distance (Feet): 300 Feet Assistive device: None Gait Gait: Yes Gait Pattern: Impaired Gait Pattern: Narrow base of support (occasional staggering with cognitive distractions, head turns) Stairs / Additional Locomotion Stairs: Yes Stairs Assistance: 6: Modified independent (Device/Increase time) Stair Management Technique: Two rails;Alternating pattern;Forwards Number of Stairs: 24 Height of Stairs: 6 Ramp: 6: Modified independent (Device) Curb: 6: Modified independent (Device/increase time) Wheelchair Mobility Wheelchair Mobility: No  Trunk/Postural Assessment  Cervical Assessment Cervical Assessment: Within Functional Limits Thoracic Assessment Thoracic Assessment: Within Functional Limits Lumbar Assessment Lumbar Assessment: Within Functional Limits Postural Control Postural Control: Within Functional Limits  Balance Balance Balance Assessed: Yes Standardized Balance Assessment Standardized Balance Assessment: Berg Balance Test;Dynamic Gait Index Berg Balance Test Sit to Stand: Able to stand without using hands and stabilize  independently Standing Unsupported: Able to stand safely 2 minutes Sitting with Back Unsupported but Feet Supported on Floor or Stool: Able to sit safely and securely 2 minutes Stand to Sit: Sits safely with minimal use of hands Transfers: Able to transfer safely, minor use of hands Standing Unsupported with Eyes Closed: Able to stand 10 seconds safely Standing Ubsupported with Feet Together: Able to place feet together independently and stand 1 minute safely From Standing, Reach Forward with Outstretched Arm:  Can reach confidently >25 cm (10") From Standing Position, Pick up Object from Floor: Able to pick up shoe safely and easily From Standing Position, Turn to Look Behind Over each Shoulder: Looks behind from both sides and weight shifts well Turn 360 Degrees: Able to turn 360 degrees safely but slowly Standing Unsupported, Alternately Place Feet on Step/Stool: Able to stand independently and safely and complete 8 steps in 20 seconds Standing Unsupported, One Foot in Front: Able to plae foot ahead of the other independently and hold 30 seconds Standing on One Leg: Tries to lift leg/unable to hold 3 seconds but remains standing independently Total Score: 50 Dynamic Gait Index Level Surface: Normal Change in Gait Speed: Normal Gait with Horizontal Head Turns: Mild Impairment Gait with Vertical Head Turns: Mild Impairment Gait and Pivot Turn: Mild Impairment Step Over Obstacle: Mild Impairment Step Around Obstacles: Normal Steps: Normal Total Score: 20 Static Sitting Balance Static Sitting - Balance Support: Feet unsupported Static Sitting - Level of Assistance: 6: Modified independent (Device/Increase time) Dynamic Sitting Balance Dynamic Sitting - Balance Support: Feet supported;During functional activity Dynamic Sitting - Level of Assistance: 6: Modified independent (Device/Increase time) Static Standing Balance Static Standing - Balance Support: Right upper extremity  supported;Left upper extremity supported;During functional activity Static Standing - Level of Assistance: 6: Modified independent (Device/Increase time) Dynamic Standing Balance Dynamic Standing - Balance Support: During functional activity;No upper extremity supported Dynamic Standing - Level of Assistance: 6: Modified independent (Device/Increase time) Extremity Assessment  RUE Assessment RUE Assessment: Within Functional Limits LUE Assessment LUE Assessment: Within Functional Limits RLE Assessment RLE Assessment: Within Functional Limits (4+/5 to 5/5 throughout) LLE Assessment LLE Assessment: Within Functional Limits (4+/5 to 5/5 throughout)  Skilled Therapeutic Intervention: Pt received supine in bed, denies pain and agreeable to treatment. Dressing performed with modI at EOB with pt ambulating throughout room without AD to retrieve clothing. Assessed all mobility, strength and balance as described above with modI overall. Pt with higher level balance impairments most noticeable with cognitive or physical dual task. Stairs unable to perform in stairwell as pt still connected to IV, however performed in gym x24 stairs total. Pt's wife not present for session as planning, however pt at modI level overall for mobility and no further family training required at this time. Pt with no further questions or concerns regarding d./c at this time; remained seated in recliner at end of session, all needs in reach.    See Function Navigator for Current Functional Status.  Benjiman Core Tygielski 11/14/2015, 8:56 AM

## 2015-11-14 NOTE — Progress Notes (Signed)
Occupational Therapy Discharge Summary  Patient Details  Name: [name] MRN: [id number] Date of Birth: March 12, 1955  Today's Date: 11/14/2015 OT Individual Time: 1000-1045 OT Individual Time Calculation (min): 45 min     Patient has met 11 of 11 long term goals due to improved activity tolerance, improved balance, postural control, ability to compensate for deficits, improved awareness and improved coordination.  Patient to discharge at overall and supervision for shower transfer level.  Patient's care partner is independent to provide the necessary supervision assistance at discharge.    Reasons goals not met: all goals met  Recommendation:  Patient does not need OT follow up at this time.   Equipment: No equipment provided  Reasons for discharge: treatment goals met  Patient/family agrees with progress made and goals achieved: Yes   OT Intervention: Upon entering the room, pt seated in recliner chair with wife present in room. Pt with no c/o pain this session and agreeable to OT intervention. Wife present for continued family education with pending discharge. Pt ambulated without use of assistive device at mod I level. Pt obtaining all needed items and ambulating to bathroom for bathing at shower level with sit <>stand from shower seat. Pt performed bathing with modified independence and dressed from toilet seat for safety. Pt standing at bathroom to shave while OT answered remaining questions for wife. OT recommends supervision for shower transfers for safety. Pt dressed and returns to recliner chair. No further needs addressed. Call bell and all needed items within reach.   OT Discharge Precautions/Restrictions Precautions Precautions: Fall Precaution Comments: HOH Restrictions Weight Bearing Restrictions: No Vital Signs Therapy Vitals Pulse Rate: 85 BP: (!) 142/77 Pain Pain Assessment Pain Assessment: No/denies pain Vision/Perception  Vision- History Baseline  Vision/History: Wears glasses Wears Glasses: Reading only Patient Visual Report: No change from baseline  Cognition Overall Cognitive Status: Within Functional Limits for tasks assessed Arousal/Alertness: Awake/alert Orientation Level: Oriented X4 Attention: Sustained Sustained Attention: Appears intact Memory: Appears intact Awareness: Appears intact Problem Solving: Appears intact Safety/Judgment: Appears intact Sensation Sensation Light Touch: Appears Intact Stereognosis: Not tested Hot/Cold: Not tested Proprioception: Appears Intact Coordination Gross Motor Movements are Fluid and Coordinated: Yes Fine Motor Movements are Fluid and Coordinated: Yes Heel Shin Test: WFL BLE Motor  Motor Motor: Within Functional Limits Motor - Discharge Observations: Generalized weakness Mobility  Bed Mobility Bed Mobility: Sit to Supine;Supine to Sit Supine to Sit: 6: Modified independent (Device/Increase time);HOB flat Sit to Supine: HOB flat;6: Modified independent (Device/Increase time) Transfers Stand to Sit: 6: Modified independent (Device/Increase time)  Trunk/Postural Assessment  Cervical Assessment Cervical Assessment: Within Functional Limits Thoracic Assessment Thoracic Assessment: Within Functional Limits Lumbar Assessment Lumbar Assessment: Within Functional Limits Postural Control Postural Control: Within Functional Limits  Balance Balance Balance Assessed: Yes Standardized Balance Assessment Standardized Balance Assessment: Berg Balance Test;Dynamic Gait Index Berg Balance Test Sit to Stand: Able to stand without using hands and stabilize independently Standing Unsupported: Able to stand safely 2 minutes Sitting with Back Unsupported but Feet Supported on Floor or Stool: Able to sit safely and securely 2 minutes Stand to Sit: Sits safely with minimal use of hands Transfers: Able to transfer safely, minor use of hands Standing Unsupported with Eyes Closed: Able to  stand 10 seconds safely Standing Ubsupported with Feet Together: Able to place feet together independently and stand 1 minute safely From Standing, Reach Forward with Outstretched Arm: Can reach confidently >25 cm (10") From Standing Position, Pick up Object from Floor: Able to pick up shoe  safely and easily From Standing Position, Turn to Look Behind Over each Shoulder: Looks behind from both sides and weight shifts well Turn 360 Degrees: Able to turn 360 degrees safely but slowly Standing Unsupported, Alternately Place Feet on Step/Stool: Able to stand independently and safely and complete 8 steps in 20 seconds Standing Unsupported, One Foot in Front: Able to plae foot ahead of the other independently and hold 30 seconds Standing on One Leg: Tries to lift leg/unable to hold 3 seconds but remains standing independently Total Score: 50 Dynamic Gait Index Level Surface: Normal Change in Gait Speed: Normal Gait with Horizontal Head Turns: Mild Impairment Gait with Vertical Head Turns: Mild Impairment Gait and Pivot Turn: Mild Impairment Step Over Obstacle: Mild Impairment Step Around Obstacles: Normal Steps: Normal Total Score: 20 Static Sitting Balance Static Sitting - Balance Support: Feet unsupported Static Sitting - Level of Assistance: 6: Modified independent (Device/Increase time) Dynamic Sitting Balance Dynamic Sitting - Balance Support: Feet supported;During functional activity Dynamic Sitting - Level of Assistance: 6: Modified independent (Device/Increase time) Static Standing Balance Static Standing - Balance Support: Right upper extremity supported;Left upper extremity supported;During functional activity Static Standing - Level of Assistance: 6: Modified independent (Device/Increase time) Dynamic Standing Balance Dynamic Standing - Balance Support: During functional activity;No upper extremity supported Dynamic Standing - Level of Assistance: 6: Modified independent  (Device/Increase time) Extremity/Trunk Assessment RUE Assessment RUE Assessment: Within Functional Limits LUE Assessment LUE Assessment: Within Functional Limits   See Function Navigator for Current Functional Status.  Darleen Crocker P 11/14/2015, 10:55 AM

## 2015-11-15 ENCOUNTER — Telehealth: Payer: Self-pay | Admitting: *Deleted

## 2015-11-15 NOTE — Telephone Encounter (Signed)
  Contact made with Mr Borre   1. Are you/is patient experiencing any problems since coming home? Are there any questions regarding any aspect of care? No 2. Are there any questions regarding medications administration/dosing? Are meds being taken as prescribed? Patient should review meds with caller to confirm He has the medications. 3. Have there been any falls? No falls 4. Has Home Health been to the house and/or have they contacted you? If not, have you tried to contact them? Can we help you contact them? They have not made contact =, he was discharged yesteday 5. Are bowels and bladder emptying properly? Are there any unexpected incontinence issues? If applicable, is patient following bowel/bladder programs?No 6. Any fevers, problems with breathing, unexpected pain? No 7. Are there any skin problems or new areas of breakdown? No 8. Has the patient/family member arranged specialty MD follow up (ie cardiology/neurology/renal/surgical/etc)?  Can we help arrange? Appt given today with Dr Naaman Plummer 9. Does the patient need any other services or support that we can help arrange? No 10. Are caregivers following through as expected in assisting the patient? Yes 11. Has the patient quit smoking, drinking alcohol, or using drugs as recommended? N/A  Appointment with Dr Naaman Plummer 11/21/15 @ 1:40 arrive by 1:20.  Will follow up with Dr Posey Pronto there after [place]

## 2015-11-15 NOTE — Progress Notes (Signed)
Social Work Discharge Note  The overall goal for the admission was met for:   Discharge location: Yes - home with wife  Length of Stay: Yes - 6 days  Discharge activity level: Yes - modified independent  Home/community participation: Yes  Services provided included: MD, RD, PT, OT, SLP, RN, Pharmacy and SW  Financial Services: Private Insurance: [employer]  Follow-up services arranged: Home Health: PT and Patient/Family has no preference for HH/DME agencies  Comments (or additional information): Pt is independent to direct his care, but is also mod I overall.  Wife will assist as needed. Pt is pleased to have come to [facility] again and is glad to be going home.  Patient/Family verbalized understanding of follow-up arrangements: Yes  Individual responsible for coordination of the follow-up plan: pt with wife for assistance as needed  Confirmed correct DME delivered: Trey Sailors 11/15/2015    Marili Vader, Silvestre Mesi

## 2015-11-15 NOTE — Patient Care Conference (Addendum)
Inpatient RehabilitationTeam Conference and Plan of Care Update Date: 11/14/2015   Time: 12:14 PM    Patient Name: [name]      Medical Record Number: [id number]  Date of Birth: [birth year] Sex: Male         Room/Bed: 4M02C/4M02C-01 Payor Info: Payor: [place] / Plan: [employer] OTHER / Product Type: *No Product type* /    Admitting Diagnosis: encephalopathy sepsis  Admit Date/Time:  11/08/2015  4:09 PM Admission Comments: No comment available   Primary Diagnosis:  Debility Principal Problem: Debility  Patient Active Problem List   Diagnosis Date Noted  . Lymphocytosis   . Secondary hypertension   . Infective otitis externa   . Encephalopathy   . Debility   . History of CVA with residual deficit   . History of endocarditis   . 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 Meningitis   . Leukocytosis   . ETOH abuse   . Hyponatremia   . Hypokalemia   . Dysphagia   . Bacteremia   . Delirium   . Respiratory failure ([place])   . Acute encephalopathy   . Elevated troponin   . MVP (mitral valve prolapse)   . Mitral valve insufficiency   . Sepsis ([place]) 10/29/2015  . Dyslipidemia 10/26/2010  . Mitral valve disorders(424.0) 08/25/2008  . DVT 08/25/2008  . SEIZURE DISORDER 08/25/2008  . CHEST PAIN 08/25/2008  . DYSPHAGIA UNSPECIFIED 08/25/2008  . MYOCARDIAL INFARCTION, ANTERIOR WALL, INITIAL EPISODE 06/18/2008  . 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 Other and unspecified hyperlipidemia 06/11/2008  . Essential hypertension 06/11/2008  . ENDOCARDITIS, BACTERIAL, ACUTE 06/11/2008  . FOLLICULITIS 75/17/0017  . NEPHROLITHIASIS, HX OF 06/11/2008    Expected Discharge Date: Expected Discharge Date: 11/14/15  Team Members Present: Physician leading conference: Dr. Delice Lesch Social Worker Present: Alfonse Alpers, LCSW Nurse Present: Elliot Cousin, RN PT Present: Kem Parkinson, PT OT Present: Benay Pillow, OT PPS Coordinator present :  Daiva Nakayama, RN, CRRN     Current Status/Progress Goal Weekly Team Focus  Medical   Functional deficts secondary to septicemia and meningitis  Improve mobility, follow inf workup  See above   Bowel/Bladder   Continent bowel and bladder  Reamin continent, no retention, no s/s infection  Monitor   Swallow/Nutrition/ Hydration             ADL's   mod I self care and supervision for shower transfers  mod I self care and overall mobility, supervision for shower transfer  pt/family education, d/c planning, safety awareness.   Mobility   modI overall  modI overall, S stairs and community mobility  Pt/family education, dynamic balance training   Communication             Safety/Cognition/ Behavioral Observations  Min A - Supervision  Mod I  problem solving, recall and safety   Pain   Denies  2/10  Managed at goal   Skin   MASD groin, buttocks; on Diflucin po, MGP to area, timed toileting  resolving at D/C  Continue Plan of Care    Rehab Goals Patient on target to meet rehab goals: Yes Rehab Goals Revised: none *See Care Plan and progress notes for long and short-term goals.  Barriers to Discharge: Mobility, fungal cx pending, HTN, leukocytosis, bacteremia/meningitis,     Possible Resolutions to Barriers:  abx completed today, follow cultures, follow labs    Discharge Planning/Teaching Needs:  Pt to return to his home with his wife  to assist as needed.  Wife was present for therapy.   Team Discussion:  Pt is medically stable for d/c and has met therapy goals.  Pt's IV antibiotics have been completed.  Wife to assist pt at home as needed.  Revisions to Treatment Plan:  none   Continued Need for Acute Rehabilitation Level of Care: The patient requires daily medical management by a physician with specialized training in physical medicine and rehabilitation for the following conditions: Daily direction of a multidisciplinary physical rehabilitation program to ensure safe treatment  while eliciting the highest outcome that is of practical value to the patient.: Yes Daily medical management of patient stability for increased activity during participation in an intensive rehabilitation regime.: Yes Daily analysis of laboratory values and/or radiology reports with any subsequent need for medication adjustment of medical intervention for : Neurological problems;Blood pressure problems;Other  [name], Silvestre Mesi 11/16/2015, 12:14 PM

## 2015-11-16 DIAGNOSIS — H6122 Impacted cerumen, left ear: Secondary | ICD-10-CM | POA: Diagnosis not present

## 2015-11-16 DIAGNOSIS — H9211 Otorrhea, right ear: Secondary | ICD-10-CM | POA: Diagnosis not present

## 2015-11-16 DIAGNOSIS — H903 Sensorineural hearing loss, bilateral: Secondary | ICD-10-CM | POA: Diagnosis not present

## 2015-11-16 DIAGNOSIS — G09 Sequelae of inflammatory diseases of central nervous system: Secondary | ICD-10-CM | POA: Diagnosis not present

## 2015-11-16 DIAGNOSIS — G9341 Metabolic encephalopathy: Secondary | ICD-10-CM | POA: Diagnosis not present

## 2015-11-16 DIAGNOSIS — F3481 Disruptive mood dysregulation disorder: Secondary | ICD-10-CM | POA: Diagnosis not present

## 2015-11-16 DIAGNOSIS — I10 Essential (primary) hypertension: Secondary | ICD-10-CM | POA: Diagnosis not present

## 2015-11-16 DIAGNOSIS — E785 Hyperlipidemia, unspecified: Secondary | ICD-10-CM | POA: Diagnosis not present

## 2015-11-16 DIAGNOSIS — I69354 Hemiplegia and hemiparesis following cerebral infarction affecting left non-dominant side: Secondary | ICD-10-CM | POA: Diagnosis not present

## 2015-11-16 DIAGNOSIS — Z9181 History of falling: Secondary | ICD-10-CM | POA: Diagnosis not present

## 2015-11-16 DIAGNOSIS — R2681 Unsteadiness on feet: Secondary | ICD-10-CM | POA: Diagnosis not present

## 2015-11-16 DIAGNOSIS — I059 Rheumatic mitral valve disease, unspecified: Secondary | ICD-10-CM | POA: Diagnosis not present

## 2015-11-16 DIAGNOSIS — H7411 Adhesive right middle ear disease: Secondary | ICD-10-CM | POA: Diagnosis not present

## 2015-11-16 DIAGNOSIS — N289 Disorder of kidney and ureter, unspecified: Secondary | ICD-10-CM | POA: Diagnosis not present

## 2015-11-18 DIAGNOSIS — H6122 Impacted cerumen, left ear: Secondary | ICD-10-CM | POA: Diagnosis not present

## 2015-11-18 DIAGNOSIS — H9211 Otorrhea, right ear: Secondary | ICD-10-CM | POA: Diagnosis not present

## 2015-11-18 DIAGNOSIS — G09 Sequelae of inflammatory diseases of central nervous system: Secondary | ICD-10-CM | POA: Diagnosis not present

## 2015-11-18 DIAGNOSIS — R2681 Unsteadiness on feet: Secondary | ICD-10-CM | POA: Diagnosis not present

## 2015-11-18 DIAGNOSIS — I10 Essential (primary) hypertension: Secondary | ICD-10-CM | POA: Diagnosis not present

## 2015-11-18 DIAGNOSIS — E785 Hyperlipidemia, unspecified: Secondary | ICD-10-CM | POA: Diagnosis not present

## 2015-11-18 DIAGNOSIS — F3481 Disruptive mood dysregulation disorder: Secondary | ICD-10-CM | POA: Diagnosis not present

## 2015-11-18 DIAGNOSIS — Z9181 History of falling: Secondary | ICD-10-CM | POA: Diagnosis not present

## 2015-11-18 DIAGNOSIS — G9341 Metabolic encephalopathy: Secondary | ICD-10-CM | POA: Diagnosis not present

## 2015-11-18 DIAGNOSIS — I059 Rheumatic mitral valve disease, unspecified: Secondary | ICD-10-CM | POA: Diagnosis not present

## 2015-11-18 DIAGNOSIS — I69354 Hemiplegia and hemiparesis following cerebral infarction affecting left non-dominant side: Secondary | ICD-10-CM | POA: Diagnosis not present

## 2015-11-18 DIAGNOSIS — N289 Disorder of kidney and ureter, unspecified: Secondary | ICD-10-CM | POA: Diagnosis not present

## 2015-11-21 ENCOUNTER — Encounter
Payer: BLUE CROSS/BLUE SHIELD | Attending: Physical Medicine & Rehabilitation | Admitting: Physical Medicine & Rehabilitation

## 2015-11-21 ENCOUNTER — Encounter: Payer: Self-pay | Admitting: Physical Medicine & Rehabilitation

## 2015-11-21 VITALS — BP 150/85 | HR 71 | Resp 17

## 2015-11-21 DIAGNOSIS — Z9181 History of falling: Secondary | ICD-10-CM | POA: Diagnosis not present

## 2015-11-21 DIAGNOSIS — G9340 Encephalopathy, unspecified: Secondary | ICD-10-CM

## 2015-11-21 DIAGNOSIS — G039 Meningitis, unspecified: Secondary | ICD-10-CM

## 2015-11-21 DIAGNOSIS — R2681 Unsteadiness on feet: Secondary | ICD-10-CM | POA: Diagnosis not present

## 2015-11-21 DIAGNOSIS — R5381 Other malaise: Secondary | ICD-10-CM | POA: Diagnosis not present

## 2015-11-21 DIAGNOSIS — I10 Essential (primary) hypertension: Secondary | ICD-10-CM | POA: Diagnosis not present

## 2015-11-21 DIAGNOSIS — G09 Sequelae of inflammatory diseases of central nervous system: Secondary | ICD-10-CM | POA: Diagnosis not present

## 2015-11-21 DIAGNOSIS — H6122 Impacted cerumen, left ear: Secondary | ICD-10-CM | POA: Diagnosis not present

## 2015-11-21 DIAGNOSIS — I69354 Hemiplegia and hemiparesis following cerebral infarction affecting left non-dominant side: Secondary | ICD-10-CM | POA: Diagnosis not present

## 2015-11-21 DIAGNOSIS — H9211 Otorrhea, right ear: Secondary | ICD-10-CM | POA: Diagnosis not present

## 2015-11-21 DIAGNOSIS — I059 Rheumatic mitral valve disease, unspecified: Secondary | ICD-10-CM | POA: Diagnosis not present

## 2015-11-21 DIAGNOSIS — F3481 Disruptive mood dysregulation disorder: Secondary | ICD-10-CM | POA: Diagnosis not present

## 2015-11-21 DIAGNOSIS — N289 Disorder of kidney and ureter, unspecified: Secondary | ICD-10-CM | POA: Diagnosis not present

## 2015-11-21 DIAGNOSIS — E785 Hyperlipidemia, unspecified: Secondary | ICD-10-CM | POA: Diagnosis not present

## 2015-11-21 DIAGNOSIS — G9341 Metabolic encephalopathy: Secondary | ICD-10-CM | POA: Diagnosis not present

## 2015-11-21 NOTE — Patient Instructions (Signed)
FOR JOINT HEALTH:  GLUCOSAMINE WITH CHONDROITIN, OMEGA 3 FATTY ACIDS, TART CHERRY EXTRACT, TURMERIC, AND CELERY SEED.

## 2015-11-21 NOTE — Progress Notes (Signed)
Subjective:    Patient ID: [name], male    DOB: Feb 13, 1955, 60 y.o.   MRN: [id number]  HPI        TRANSITIONAL CARE VISIT.  [name] is here in follow up of his encephalitis. He was discharged from rehab a week ago. He has completed antibiotics. Therapy is coming out to the house. He reports no problems with balance. He does report some tightness and pain in his knees in the morning when he first wakes up. His wife states that he's eating well but doesn't eat a balanced diet. He has an appt with his primary next week and ID in early December. He denies any coughing, headache, fever, chills, etc. He has been sleeping well. Mood and cognition are near baseline.   Pain Inventory Average Pain 0 Pain Right Now 0 My pain is no pain  In the last 24 hours, has pain interfered with the following? General activity 0 Relation with others 0 Enjoyment of life 0 What TIME of day is your pain at its worst? no pain Sleep (in general) no pain  Pain is worse with: no pain Pain improves with: no pain Relief from Meds: no pain  Mobility walk without assistance how many minutes can you walk? 10 ability to climb steps?  yes do you drive?  no  Function retired  Neuro/Psych trouble walking loss of taste or smell  Prior Studies Any changes since last visit?  no  Physicians involved in your care Any changes since last visit?  no   Family History  Problem Relation Age of Onset  . Heart disease Mother     mom RF as a child (died of heart problem in  8 )   Social History   Social History  . Marital status: Legally Separated    Spouse name: N/A  . Number of children: N/A  . Years of education: N/A   Social History Main Topics  . Smoking status: Never Smoker  . Smokeless tobacco: Never Used  . Alcohol use No  . Drug use: No  . Sexual activity: Not Asked   Other Topics Concern  . 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 NO PAST SURGERIES     Past Medical History:  Diagnosis Date  . Bacteremia 110/2017  . CVA (cerebral infarction)    hemmorhagic  CVA  . Dyslipidemia   . Endocarditis    MV Vegitation  . Mitral regurgitation   . Myocardial infarction   . Stroke ([place])    BP (!) 150/85   Pulse 71   Resp 17   SpO2 98%   Opioid Risk Score:   Fall Risk Score:  `1  Depression screen PHQ 2/9  Depression screen PHQ 2/9 11/21/2015  Decreased Interest 0  Down, Depressed, Hopeless 1  PHQ - 2 Score 1  Altered sleeping 0  Tired, decreased energy 1  Change in appetite 1  Feeling bad or failure about yourself  1  Trouble concentrating 1  Moving slowly or fidgety/restless 0  Suicidal thoughts 0  PHQ-9 Score 5  Difficult doing work/chores Somewhat difficult   Review of Systems  Constitutional: Negative.   HENT: Negative.   Eyes: Negative.   Respiratory: Negative.   Cardiovascular: Negative.   Gastrointestinal: Negative.   Endocrine: Negative.   Genitourinary: Negative.   Musculoskeletal:  Negative.   Skin: Negative.   Allergic/Immunologic: Negative.   Neurological: Negative.   Hematological: Negative.   Psychiatric/Behavioral: Negative.   All other systems reviewed and are negative.      Objective:   Physical Exam  HENT: [place] atraumatic.  Eyes: EOMare normal. No discharge. Cardiovascular: RRR. Respiratory: Normal effort GI: soft.  Musculoskeletal: no edema or joint stiffness,pain. No crepitus in knees. No effusions Neurological: He is alertand oriented.  HOH.  Mild left facial weakness persists.  Able to follow basic commands without difficulty.  Motor: LUE/LLE: 5/5 bilaterally  RUE/RLE 5/5 bilaterally. Sensation is normal Gait is normal although he has difficulty with tandem technique.  DTR's 2+ Skin: Skin is warmand dry.  Psychiatric: impulsive, sometimes irritable behavior noted     Assessment & Plan:    1. Functional and cognitive deficitssecondary to septicemia and meningitis             Fungal cultures negative  -follow up with ID next month  -from my standpoint, he's nearing baseline.   -can complete HH and move to HEP soon. 2.  Mood:  Wellbutrin XL.  3. Group B strep bacteremia with meningitis:              abx completed 9. BPH s/p TURP: On finasteride             12. Encephalopathy:  Near baseline cognitively.  13. Right renal mass:  Follow up with Dr. [name] on outpatient basis.  14.MVP: Cont lopressor bid.   Thirty minutes of face to face patient care time were spent during this visit. All questions were encouraged and answered. Folow up with me PRN

## 2015-11-23 DIAGNOSIS — F09 Unspecified mental disorder due to known physiological condition: Secondary | ICD-10-CM | POA: Diagnosis not present

## 2015-11-23 DIAGNOSIS — I69918 Other symptoms and signs involving cognitive functions following unspecified cerebrovascular disease: Secondary | ICD-10-CM | POA: Diagnosis not present

## 2015-11-23 DIAGNOSIS — H6122 Impacted cerumen, left ear: Secondary | ICD-10-CM | POA: Diagnosis not present

## 2015-11-23 DIAGNOSIS — I69354 Hemiplegia and hemiparesis following cerebral infarction affecting left non-dominant side: Secondary | ICD-10-CM | POA: Diagnosis not present

## 2015-11-23 DIAGNOSIS — I059 Rheumatic mitral valve disease, unspecified: Secondary | ICD-10-CM | POA: Diagnosis not present

## 2015-11-23 DIAGNOSIS — H9211 Otorrhea, right ear: Secondary | ICD-10-CM | POA: Diagnosis not present

## 2015-11-23 DIAGNOSIS — G09 Sequelae of inflammatory diseases of central nervous system: Secondary | ICD-10-CM | POA: Diagnosis not present

## 2015-11-23 DIAGNOSIS — Z9181 History of falling: Secondary | ICD-10-CM | POA: Diagnosis not present

## 2015-11-23 DIAGNOSIS — E785 Hyperlipidemia, unspecified: Secondary | ICD-10-CM | POA: Diagnosis not present

## 2015-11-23 DIAGNOSIS — I10 Essential (primary) hypertension: Secondary | ICD-10-CM | POA: Diagnosis not present

## 2015-11-23 DIAGNOSIS — N289 Disorder of kidney and ureter, unspecified: Secondary | ICD-10-CM | POA: Diagnosis not present

## 2015-11-23 DIAGNOSIS — F3481 Disruptive mood dysregulation disorder: Secondary | ICD-10-CM | POA: Diagnosis not present

## 2015-11-23 DIAGNOSIS — G9341 Metabolic encephalopathy: Secondary | ICD-10-CM | POA: Diagnosis not present

## 2015-11-23 DIAGNOSIS — R2681 Unsteadiness on feet: Secondary | ICD-10-CM | POA: Diagnosis not present

## 2015-11-23 DIAGNOSIS — H6090 Unspecified otitis externa, unspecified ear: Secondary | ICD-10-CM | POA: Diagnosis not present

## 2015-11-23 DIAGNOSIS — H6690 Otitis media, unspecified, unspecified ear: Secondary | ICD-10-CM | POA: Diagnosis not present

## 2015-11-29 ENCOUNTER — Telehealth: Payer: Self-pay | Admitting: Physical Medicine & Rehabilitation

## 2015-11-29 NOTE — Telephone Encounter (Signed)
Dr. Posey Pronto needs to see patient at least one more time, I have left a voicemail to schedule another appointment with him.

## 2015-11-30 DIAGNOSIS — E785 Hyperlipidemia, unspecified: Secondary | ICD-10-CM | POA: Diagnosis not present

## 2015-11-30 DIAGNOSIS — I10 Essential (primary) hypertension: Secondary | ICD-10-CM | POA: Diagnosis not present

## 2015-11-30 DIAGNOSIS — R2681 Unsteadiness on feet: Secondary | ICD-10-CM | POA: Diagnosis not present

## 2015-11-30 DIAGNOSIS — I69354 Hemiplegia and hemiparesis following cerebral infarction affecting left non-dominant side: Secondary | ICD-10-CM | POA: Diagnosis not present

## 2015-11-30 DIAGNOSIS — N289 Disorder of kidney and ureter, unspecified: Secondary | ICD-10-CM | POA: Diagnosis not present

## 2015-11-30 DIAGNOSIS — H9211 Otorrhea, right ear: Secondary | ICD-10-CM | POA: Diagnosis not present

## 2015-11-30 DIAGNOSIS — H6122 Impacted cerumen, left ear: Secondary | ICD-10-CM | POA: Diagnosis not present

## 2015-11-30 DIAGNOSIS — F3481 Disruptive mood dysregulation disorder: Secondary | ICD-10-CM | POA: Diagnosis not present

## 2015-11-30 DIAGNOSIS — I059 Rheumatic mitral valve disease, unspecified: Secondary | ICD-10-CM | POA: Diagnosis not present

## 2015-11-30 DIAGNOSIS — Z9181 History of falling: Secondary | ICD-10-CM | POA: Diagnosis not present

## 2015-11-30 DIAGNOSIS — G09 Sequelae of inflammatory diseases of central nervous system: Secondary | ICD-10-CM | POA: Diagnosis not present

## 2015-11-30 DIAGNOSIS — G9341 Metabolic encephalopathy: Secondary | ICD-10-CM | POA: Diagnosis not present

## 2015-12-01 LAB — FUNGUS CULTURE WITH STAIN

## 2015-12-01 LAB — FUNGUS CULTURE RESULT

## 2015-12-01 LAB — FUNGAL ORGANISM REFLEX

## 2015-12-06 DIAGNOSIS — H903 Sensorineural hearing loss, bilateral: Secondary | ICD-10-CM | POA: Diagnosis not present

## 2015-12-06 DIAGNOSIS — H7411 Adhesive right middle ear disease: Secondary | ICD-10-CM | POA: Diagnosis not present

## 2015-12-12 ENCOUNTER — Encounter: Payer: Self-pay | Admitting: Internal Medicine

## 2015-12-12 ENCOUNTER — Ambulatory Visit (INDEPENDENT_AMBULATORY_CARE_PROVIDER_SITE_OTHER): Payer: BLUE CROSS/BLUE SHIELD | Admitting: Internal Medicine

## 2015-12-12 DIAGNOSIS — G039 Meningitis, unspecified: Secondary | ICD-10-CM | POA: Diagnosis not present

## 2015-12-12 DIAGNOSIS — G9340 Encephalopathy, unspecified: Secondary | ICD-10-CM

## 2015-12-12 DIAGNOSIS — R7881 Bacteremia: Secondary | ICD-10-CM

## 2015-12-12 DIAGNOSIS — H903 Sensorineural hearing loss, bilateral: Secondary | ICD-10-CM | POA: Diagnosis not present

## 2015-12-12 DIAGNOSIS — H7411 Adhesive right middle ear disease: Secondary | ICD-10-CM | POA: Diagnosis not present

## 2015-12-12 NOTE — Progress Notes (Signed)
   Subjective:    Patient ID: [name], male    DOB: [birth year], 60 y.o.   MRN: [id number]  HPI Here for hsfu.  [name] is a 60 y.o. 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 Two days prior to admission, patient underwent a Urologic procedure (transuretheral and transrectal) for evaluation and treatment of his BPH. The details of this procedure are not known. In conjunction with the procedure, patient took a prophylactic dose of Ciprofloxacin. Patient did well after the procedure and the following day, but was found by his wife on the bathroom floor the night following. He had his pants off but appeared to have fallen on the floor prior to sitting on the toilet.    Blood cultures grew out GBS and also with his encephalopathy an LP was done and also with GBS.  He was continued on penicillin IV and rehab and completed antibiotics about 1 month ago.  No fever, no chills, no other new issues.    Review of Systems  Constitutional: Negative for chills, fatigue and fever.  Gastrointestinal: Negative for diarrhea.  Skin: Negative for rash.  Neurological: Negative for dizziness.       Objective:   Physical Exam  Constitutional: He appears well-developed and well-nourished. No distress.  Eyes: No scleral icterus.  Cardiovascular: Normal rate, regular rhythm and normal heart sounds.   Skin: No rash noted.          Assessment & Plan:

## 2015-12-12 NOTE — Assessment & Plan Note (Signed)
Resolved now. 

## 2015-12-12 NOTE — Assessment & Plan Note (Signed)
Resolved

## 2015-12-12 NOTE — Assessment & Plan Note (Signed)
Resolved, picc line placed.

## 2016-01-03 DIAGNOSIS — R3915 Urgency of urination: Secondary | ICD-10-CM | POA: Diagnosis not present

## 2016-01-03 DIAGNOSIS — N401 Enlarged prostate with lower urinary tract symptoms: Secondary | ICD-10-CM | POA: Diagnosis not present

## 2016-01-03 DIAGNOSIS — C641 Malignant neoplasm of right kidney, except renal pelvis: Secondary | ICD-10-CM | POA: Diagnosis not present

## 2016-01-04 ENCOUNTER — Other Ambulatory Visit: Payer: Self-pay | Admitting: Urology

## 2016-01-06 DIAGNOSIS — I69919 Unspecified symptoms and signs involving cognitive functions following unspecified cerebrovascular disease: Secondary | ICD-10-CM | POA: Diagnosis not present

## 2016-01-06 DIAGNOSIS — N2889 Other specified disorders of kidney and ureter: Secondary | ICD-10-CM | POA: Diagnosis not present

## 2016-01-06 DIAGNOSIS — H9190 Unspecified hearing loss, unspecified ear: Secondary | ICD-10-CM | POA: Diagnosis not present

## 2016-01-06 DIAGNOSIS — F09 Unspecified mental disorder due to known physiological condition: Secondary | ICD-10-CM | POA: Diagnosis not present

## 2016-01-09 DIAGNOSIS — I10 Essential (primary) hypertension: Secondary | ICD-10-CM | POA: Diagnosis not present

## 2016-01-09 DIAGNOSIS — Z79899 Other long term (current) drug therapy: Secondary | ICD-10-CM | POA: Diagnosis not present

## 2016-01-09 DIAGNOSIS — E782 Mixed hyperlipidemia: Secondary | ICD-10-CM | POA: Diagnosis not present

## 2016-02-15 NOTE — Patient Instructions (Signed)
[name]  02/15/2016   Your procedure is scheduled on: 02/22/16  Report to [facility] Main  Entrance take [place]  elevators to 3rd floor to  [place] at    [place] AM.  Call this number if you have problems the morning of surgery [phone]   Remember: ONLY 1 PERSON MAY GO WITH YOU TO SHORT STAY TO GET  READY MORNING OF [place].             CLEAR LIQUID DIET THE DAY BEFORE SURGERY            1 bottle of magnesium citrate by noon the day before surgery   Do not eat food or drink liquids :After Midnight.     Take these medicines the morning of surgery with A SIP OF WATER: Metoprolol, proscar, wellbutrin    CLEAR LIQUID DIET   Foods Allowed                                                                     Foods Excluded  Coffee and tea, regular and decaf                             liquids that you cannot  Plain Jell-O in any flavor                                             see through such as: Fruit ices (not with fruit pulp)                                     milk, soups, orange juice  Iced Popsicles                                    All solid food Carbonated beverages, regular and diet                                    Cranberry, grape and apple juices Sports drinks like Gatorade Lightly seasoned clear broth or consume(fat free) Sugar, honey syrup  Sample Menu Breakfast                                Lunch                                     Supper Cranberry juice                    Beef broth  Chicken broth Jell-O                                     Grape juice                           Apple juice Coffee or tea                        Jell-O                                      Popsicle                                                Coffee or tea                        Coffee or tea  _____________________________________________________________________  [facility] - Preparing for Surgery Before  surgery, you can play an important role.  Because skin is not sterile, your skin needs to be as free of germs as possible.  You can reduce the number of germs on your skin by washing with CHG (chlorahexidine gluconate) soap before surgery.  CHG is an antiseptic cleaner which kills germs and bonds with the skin to continue killing germs even after washing. Please DO NOT use if you have an allergy to CHG or antibacterial soaps.  If your skin becomes reddened/irritated stop using the CHG and inform your nurse when you arrive at Short Stay. Do not shave (including legs and underarms) for at least 48 hours prior to the first CHG shower.  You may shave your face/neck. Please follow these instructions carefully:  1.  Shower with CHG Soap the night before surgery and the  morning of Surgery.  2.  If you choose to wash your hair, wash your hair first as usual with your  normal  shampoo.  3.  After you shampoo, rinse your hair and body thoroughly to remove the  shampoo.                           4.  Use CHG as you would any other liquid soap.  You can apply chg directly  to the skin and wash                       Gently with a scrungie or clean washcloth.  5.  Apply the CHG Soap to your body ONLY FROM THE NECK DOWN.   Do not use on face/ open                           Wound or open sores. Avoid contact with eyes, ears mouth and genitals (private parts).                       Wash face,  Genitals (private parts) with your normal soap.             6.  Wash thoroughly, paying special attention to the area  where your surgery  will be performed.  7.  Thoroughly rinse your body with warm water from the neck down.  8.  DO NOT shower/wash with your normal soap after using and rinsing off  the CHG Soap.                9.  Pat yourself dry with a clean towel.            10.  Wear clean pajamas.            11.  Place clean sheets on your bed the night of your first shower and do not  sleep with pets. Day of Surgery  : Do not apply any lotions/deodorants the morning of surgery.  Please wear clean clothes to the hospital/surgery center.  FAILURE TO FOLLOW THESE INSTRUCTIONS MAY RESULT IN THE CANCELLATION OF YOUR SURGERY PATIENT SIGNATURE_________________________________  NURSE SIGNATURE__________________________________  ________________________________________________________________________                                 Dennis Bast may not have any metal on your body including hair pins and              piercings  Do not wear jewelry, make-up, lotions, powders or perfumes, deodorant             Do not wear nail polish.  Do not shave  48 hours prior to surgery.              Men may shave face and neck.   Do not bring valuables to the hospital. [place].  Contacts, dentures or bridgework may not be worn into surgery.  Leave suitcase in the car. After surgery it may be brought to your room.     Patients discharged the day of surgery will not be allowed to drive home.  Name and phone number of your driver:  Special Instructions: N/A              Please read over the following fact sheets you were given: _____________________________________________________________________

## 2016-02-16 ENCOUNTER — Encounter (HOSPITAL_COMMUNITY)
Admission: RE | Admit: 2016-02-16 | Discharge: 2016-02-16 | Disposition: A | Discharge status: Home / Self Care | Payer: BLUE CROSS/BLUE SHIELD | Source: Ambulatory Visit | Attending: Urology | Admitting: Urology

## 2016-02-16 ENCOUNTER — Encounter (HOSPITAL_COMMUNITY): Payer: Self-pay

## 2016-02-16 DIAGNOSIS — Z01812 Encounter for preprocedural laboratory examination: Secondary | ICD-10-CM | POA: Diagnosis not present

## 2016-02-16 HISTORY — DX: Malignant (primary) neoplasm, unspecified: C80.1

## 2016-02-16 HISTORY — DX: Major depressive disorder, single episode, unspecified: F32.9

## 2016-02-16 HISTORY — DX: Depression, unspecified: F32.A

## 2016-02-16 HISTORY — DX: Personal history of urinary calculi: Z87.442

## 2016-02-16 HISTORY — DX: Essential (primary) hypertension: I10

## 2016-02-16 LAB — BASIC METABOLIC PANEL
Anion gap: 7 (ref 5–15)
BUN: 21 mg/dL — AB (ref 6–20)
CHLORIDE: 111 mmol/L (ref 101–111)
CO2: 29 mmol/L (ref 22–32)
Calcium: 9.8 mg/dL (ref 8.9–10.3)
Creatinine, Ser: 1.14 mg/dL (ref 0.61–1.24)
GFR calc Af Amer: >60 mL/min (ref >60)
GLUCOSE: 91 mg/dL (ref 65–99)
POTASSIUM: 4.6 mmol/L (ref 3.5–5.1)
Sodium: 147 mmol/L — ABNORMAL HIGH (ref 135–145)

## 2016-02-16 LAB — CBC
HEMATOCRIT: 40.2 % (ref 39.0–52.0)
Hemoglobin: 13.6 g/dL (ref 13.0–17.0)
MCH: 29.2 pg (ref 26.0–34.0)
MCHC: 33.8 g/dL (ref 30.0–36.0)
MCV: 86.5 fL (ref 78.0–100.0)
Platelets: 239 10*3/uL (ref 150–400)
RBC: 4.65 MIL/uL (ref 4.22–5.81)
RDW: 13.2 % (ref 11.5–15.5)
WBC: 6.4 10*3/uL (ref 4.0–10.5)

## 2016-02-16 LAB — ABO/RH: ABO/RH(D): A POS

## 2016-02-16 NOTE — Progress Notes (Signed)
EKG 10/30/15 Echo 10/31/15  CXR 11/03/15 All in epic

## 2016-02-22 ENCOUNTER — Encounter (HOSPITAL_COMMUNITY): Payer: Self-pay | Admitting: Registered Nurse

## 2016-02-22 ENCOUNTER — Inpatient Hospital Stay (HOSPITAL_COMMUNITY): Payer: BLUE CROSS/BLUE SHIELD | Admitting: Registered Nurse

## 2016-02-22 ENCOUNTER — Inpatient Hospital Stay (HOSPITAL_COMMUNITY)
Admission: RE | Admit: 2016-02-22 | Discharge: 2016-02-23 | DRG: 658 | Disposition: A | Discharge status: Home / Self Care | Payer: BLUE CROSS/BLUE SHIELD | Source: Ambulatory Visit | Attending: Urology | Admitting: Urology

## 2016-02-22 ENCOUNTER — Encounter (HOSPITAL_COMMUNITY)
Admission: RE | Disposition: A | Discharge status: Home / Self Care | Payer: Self-pay | Source: Ambulatory Visit | Attending: Urology

## 2016-02-22 DIAGNOSIS — I252 Old myocardial infarction: Secondary | ICD-10-CM | POA: Diagnosis not present

## 2016-02-22 DIAGNOSIS — N2889 Other specified disorders of kidney and ureter: Secondary | ICD-10-CM | POA: Diagnosis present

## 2016-02-22 DIAGNOSIS — I10 Essential (primary) hypertension: Secondary | ICD-10-CM | POA: Diagnosis not present

## 2016-02-22 DIAGNOSIS — I340 Nonrheumatic mitral (valve) insufficiency: Secondary | ICD-10-CM | POA: Diagnosis present

## 2016-02-22 DIAGNOSIS — Z8249 Family history of ischemic heart disease and other diseases of the circulatory system: Secondary | ICD-10-CM

## 2016-02-22 DIAGNOSIS — Z8673 Personal history of transient ischemic attack (TIA), and cerebral infarction without residual deficits: Secondary | ICD-10-CM | POA: Diagnosis not present

## 2016-02-22 DIAGNOSIS — C641 Malignant neoplasm of right kidney, except renal pelvis: Principal | ICD-10-CM | POA: Diagnosis present

## 2016-02-22 DIAGNOSIS — E785 Hyperlipidemia, unspecified: Secondary | ICD-10-CM | POA: Diagnosis not present

## 2016-02-22 HISTORY — PX: ROBOT ASSISTED LAPAROSCOPIC NEPHRECTOMY: SHX5140

## 2016-02-22 LAB — TYPE AND SCREEN
ABO/RH(D): A POS
ANTIBODY SCREEN: NEGATIVE

## 2016-02-22 LAB — HEMOGLOBIN AND HEMATOCRIT, BLOOD
HCT: 39.1 % (ref 39.0–52.0)
HEMOGLOBIN: 13.5 g/dL (ref 13.0–17.0)

## 2016-02-22 SURGERY — NEPHRECTOMY, RADICAL, ROBOT-ASSISTED, LAPAROSCOPIC, ADULT
Anesthesia: General | Laterality: Right

## 2016-02-22 MED ORDER — CIPROFLOXACIN IN D5W 400 MG/200ML IV SOLN
INTRAVENOUS | Status: AC
  Filled 2016-02-22: qty 200

## 2016-02-22 MED ORDER — KCL IN DEXTROSE-NACL 20-5-0.45 MEQ/L-%-% IV SOLN
INTRAVENOUS | Status: DC
  Administered 2016-02-22 – 2016-02-23 (×2): via INTRAVENOUS
  Filled 2016-02-22 (×2): qty 1000

## 2016-02-22 MED ORDER — LIDOCAINE 2% (20 MG/ML) 5 ML SYRINGE
INTRAMUSCULAR | Status: AC
  Filled 2016-02-22: qty 15

## 2016-02-22 MED ORDER — MIDAZOLAM HCL 5 MG/5ML IJ SOLN
INTRAMUSCULAR | Status: DC | PRN
  Administered 2016-02-22: 2 mg via INTRAVENOUS

## 2016-02-22 MED ORDER — ONDANSETRON HCL 4 MG/2ML IJ SOLN: 4.0000 mg | INTRAMUSCULAR | Status: DC | PRN

## 2016-02-22 MED ORDER — ROSUVASTATIN CALCIUM 10 MG PO TABS
10.0000 mg | ORAL_TABLET | Freq: Every day | ORAL | Status: DC
  Administered 2016-02-22: 10 mg via ORAL
  Filled 2016-02-22: qty 1

## 2016-02-22 MED ORDER — LACTATED RINGERS IV SOLN
INTRAVENOUS | Status: DC | PRN
  Administered 2016-02-22 (×3): via INTRAVENOUS

## 2016-02-22 MED ORDER — BUPIVACAINE LIPOSOME 1.3 % IJ SUSP
INTRAMUSCULAR | Status: DC | PRN
  Administered 2016-02-22: 20 mL

## 2016-02-22 MED ORDER — DOCUSATE SODIUM 100 MG PO CAPS
100.0000 mg | ORAL_CAPSULE | Freq: Two times a day (BID) | ORAL | Status: DC
Start: 2016-02-22 — End: 2016-02-23
  Administered 2016-02-22 – 2016-02-23 (×2): 100 mg via ORAL
  Filled 2016-02-22 (×2): qty 1

## 2016-02-22 MED ORDER — HYDROMORPHONE HCL 2 MG/ML IJ SOLN
INTRAMUSCULAR | Status: AC
  Filled 2016-02-22: qty 1

## 2016-02-22 MED ORDER — EPHEDRINE SULFATE-NACL 50-0.9 MG/10ML-% IV SOSY
PREFILLED_SYRINGE | INTRAVENOUS | Status: DC | PRN
  Administered 2016-02-22 (×2): 10 mg via INTRAVENOUS
  Administered 2016-02-22 (×2): 20 mg via INTRAVENOUS
  Administered 2016-02-22: 10 mg via INTRAVENOUS

## 2016-02-22 MED ORDER — FENTANYL CITRATE (PF) 100 MCG/2ML IJ SOLN
INTRAMUSCULAR | Status: DC | PRN
  Administered 2016-02-22: 150 ug via INTRAVENOUS
  Administered 2016-02-22 (×2): 50 ug via INTRAVENOUS

## 2016-02-22 MED ORDER — PROMETHAZINE HCL 25 MG/ML IJ SOLN: 6.2500 mg | INTRAMUSCULAR | Status: DC | PRN

## 2016-02-22 MED ORDER — DEXAMETHASONE SODIUM PHOSPHATE 10 MG/ML IJ SOLN
INTRAMUSCULAR | Status: AC
  Filled 2016-02-22: qty 1

## 2016-02-22 MED ORDER — FENTANYL CITRATE (PF) 250 MCG/5ML IJ SOLN
INTRAMUSCULAR | Status: AC
  Filled 2016-02-22: qty 5

## 2016-02-22 MED ORDER — DEXAMETHASONE SODIUM PHOSPHATE 10 MG/ML IJ SOLN
INTRAMUSCULAR | Status: DC | PRN
  Administered 2016-02-22: 10 mg via INTRAVENOUS

## 2016-02-22 MED ORDER — PROPOFOL 10 MG/ML IV BOLUS
INTRAVENOUS | Status: AC
  Filled 2016-02-22: qty 40

## 2016-02-22 MED ORDER — CLINDAMYCIN PHOSPHATE 900 MG/50ML IV SOLN
900.0000 mg | INTRAVENOUS | Status: AC
  Administered 2016-02-22: 900 mg via INTRAVENOUS

## 2016-02-22 MED ORDER — LACTATED RINGERS IR SOLN
Status: DC | PRN
  Administered 2016-02-22: 1000 mL

## 2016-02-22 MED ORDER — OXYCODONE HCL 5 MG PO TABS
5.0000 mg | ORAL_TABLET | ORAL | Status: DC | PRN
  Administered 2016-02-22 – 2016-02-23 (×4): 5 mg via ORAL
  Filled 2016-02-22 (×4): qty 1

## 2016-02-22 MED ORDER — PROPOFOL 10 MG/ML IV BOLUS
INTRAVENOUS | Status: DC | PRN
  Administered 2016-02-22: 200 mg via INTRAVENOUS

## 2016-02-22 MED ORDER — ROCURONIUM BROMIDE 50 MG/5ML IV SOSY
PREFILLED_SYRINGE | INTRAVENOUS | Status: AC
Start: 2016-02-22 — End: 2016-02-22
  Filled 2016-02-22: qty 15

## 2016-02-22 MED ORDER — MEPERIDINE HCL 50 MG/ML IJ SOLN: 6.2500 mg | INTRAMUSCULAR | Status: DC | PRN

## 2016-02-22 MED ORDER — ACETAMINOPHEN 500 MG PO TABS
1000.0000 mg | ORAL_TABLET | Freq: Three times a day (TID) | ORAL | Status: AC
  Administered 2016-02-22 – 2016-02-23 (×3): 1000 mg via ORAL
  Filled 2016-02-22 (×3): qty 2

## 2016-02-22 MED ORDER — HYDROCODONE-ACETAMINOPHEN 5-325 MG PO TABS: 1.0000 | ORAL_TABLET | ORAL | 0 refills | Status: DC | PRN

## 2016-02-22 MED ORDER — SODIUM CHLORIDE 0.9 % IJ SOLN
INTRAMUSCULAR | Status: DC | PRN
  Administered 2016-02-22: 20 mL

## 2016-02-22 MED ORDER — BUPIVACAINE LIPOSOME 1.3 % IJ SUSP
INTRAMUSCULAR | Status: AC
  Filled 2016-02-22: qty 20

## 2016-02-22 MED ORDER — SODIUM CHLORIDE 0.9 % IJ SOLN
INTRAMUSCULAR | Status: AC
  Filled 2016-02-22: qty 50

## 2016-02-22 MED ORDER — EPHEDRINE 5 MG/ML INJ
INTRAVENOUS | Status: AC
  Filled 2016-02-22: qty 10

## 2016-02-22 MED ORDER — HYDROMORPHONE HCL 1 MG/ML IJ SOLN: 0.5000 mg | INTRAMUSCULAR | Status: DC | PRN

## 2016-02-22 MED ORDER — ROCURONIUM BROMIDE 50 MG/5ML IV SOSY
PREFILLED_SYRINGE | INTRAVENOUS | Status: AC
  Filled 2016-02-22: qty 20

## 2016-02-22 MED ORDER — SUGAMMADEX SODIUM 500 MG/5ML IV SOLN
INTRAVENOUS | Status: AC
  Filled 2016-02-22: qty 5

## 2016-02-22 MED ORDER — STERILE WATER FOR IRRIGATION IR SOLN
Status: DC | PRN
  Administered 2016-02-22: 1000 mL

## 2016-02-22 MED ORDER — BUPROPION HCL ER (XL) 150 MG PO TB24
150.0000 mg | ORAL_TABLET | Freq: Every day | ORAL | Status: DC
  Administered 2016-02-23: 150 mg via ORAL
  Filled 2016-02-22: qty 1

## 2016-02-22 MED ORDER — ONDANSETRON HCL 4 MG/2ML IJ SOLN
INTRAMUSCULAR | Status: DC | PRN
  Administered 2016-02-22: 4 mg via INTRAVENOUS

## 2016-02-22 MED ORDER — MIDAZOLAM HCL 2 MG/2ML IJ SOLN
INTRAMUSCULAR | Status: AC
  Filled 2016-02-22: qty 2

## 2016-02-22 MED ORDER — METOPROLOL TARTRATE 25 MG PO TABS
25.0000 mg | ORAL_TABLET | Freq: Two times a day (BID) | ORAL | Status: DC
  Administered 2016-02-22 – 2016-02-23 (×2): 25 mg via ORAL
  Filled 2016-02-22 (×2): qty 1

## 2016-02-22 MED ORDER — CLINDAMYCIN PHOSPHATE 900 MG/50ML IV SOLN
INTRAVENOUS | Status: AC
  Filled 2016-02-22: qty 50

## 2016-02-22 MED ORDER — ROCURONIUM BROMIDE 10 MG/ML (PF) SYRINGE
PREFILLED_SYRINGE | INTRAVENOUS | Status: DC | PRN
  Administered 2016-02-22: 50 mg via INTRAVENOUS
  Administered 2016-02-22: 20 mg via INTRAVENOUS

## 2016-02-22 MED ORDER — SUGAMMADEX SODIUM 200 MG/2ML IV SOLN
INTRAVENOUS | Status: DC | PRN
  Administered 2016-02-22: 400 mg via INTRAVENOUS

## 2016-02-22 MED ORDER — PHENYLEPHRINE 40 MCG/ML (10ML) SYRINGE FOR IV PUSH (FOR BLOOD PRESSURE SUPPORT)
PREFILLED_SYRINGE | INTRAVENOUS | Status: DC | PRN
  Administered 2016-02-22 (×5): 80 ug via INTRAVENOUS

## 2016-02-22 MED ORDER — HYDROMORPHONE HCL 1 MG/ML IJ SOLN
0.2500 mg | INTRAMUSCULAR | Status: DC | PRN
  Administered 2016-02-22 (×2): 0.5 mg via INTRAVENOUS

## 2016-02-22 MED ORDER — ONDANSETRON HCL 4 MG/2ML IJ SOLN
INTRAMUSCULAR | Status: AC
  Filled 2016-02-22: qty 2

## 2016-02-22 MED ORDER — EPHEDRINE 5 MG/ML INJ
INTRAVENOUS | Status: AC
  Filled 2016-02-22: qty 20

## 2016-02-22 MED ORDER — FINASTERIDE 5 MG PO TABS
5.0000 mg | ORAL_TABLET | Freq: Every day | ORAL | Status: DC
  Administered 2016-02-23: 5 mg via ORAL
  Filled 2016-02-22: qty 1

## 2016-02-22 MED ORDER — LIDOCAINE 2% (20 MG/ML) 5 ML SYRINGE
INTRAMUSCULAR | Status: DC | PRN
  Administered 2016-02-22: 100 mg via INTRAVENOUS

## 2016-02-22 MED ORDER — CIPROFLOXACIN IN D5W 400 MG/200ML IV SOLN
400.0000 mg | INTRAVENOUS | Status: AC
  Administered 2016-02-22: 400 mg via INTRAVENOUS

## 2016-02-22 SURGICAL SUPPLY — 57 items
ADH SKN CLS APL DERMABOND .7 (GAUZE/BANDAGES/DRESSINGS) ×1
BAG LAPAROSCOPIC 12 15 PORT 16 (BASKET) ×1 IMPLANT
BAG RETRIEVAL 12/15 (BASKET) ×2
CHLORAPREP W/TINT 26ML (MISCELLANEOUS) ×2 IMPLANT
CLIP LIGATING HEM O LOK PURPLE (MISCELLANEOUS) ×2 IMPLANT
CLIP LIGATING HEMO LOK XL GOLD (MISCELLANEOUS) ×2 IMPLANT
CLIP LIGATING HEMO O LOK GREEN (MISCELLANEOUS) ×2 IMPLANT
COVER TIP SHEARS 8 DVNC (MISCELLANEOUS) ×1 IMPLANT
COVER TIP SHEARS 8MM DA VINCI (MISCELLANEOUS) ×1
CUTTER ECHEON FLEX ENDO 45 340 (ENDOMECHANICALS) ×2 IMPLANT
DECANTER SPIKE VIAL GLASS SM (MISCELLANEOUS) ×2 IMPLANT
DERMABOND ADVANCED (GAUZE/BANDAGES/DRESSINGS) ×1
DERMABOND ADVANCED .7 DNX12 (GAUZE/BANDAGES/DRESSINGS) ×1 IMPLANT
DRAIN CHANNEL 15F RND FF 3/16 (WOUND CARE) IMPLANT
DRAPE ARM DVNC X/XI (DISPOSABLE) ×4 IMPLANT
DRAPE COLUMN DVNC XI (DISPOSABLE) ×1 IMPLANT
DRAPE DA VINCI XI ARM (DISPOSABLE) ×4
DRAPE DA VINCI XI COLUMN (DISPOSABLE) ×1
DRAPE INCISE IOBAN 66X45 STRL (DRAPES) ×2 IMPLANT
DRAPE LAPAROSCOPIC ABDOMINAL (DRAPES) ×2 IMPLANT
DRAPE SHEET LG 3/4 BI-LAMINATE (DRAPES) ×2 IMPLANT
ELECT PENCIL ROCKER SW 15FT (MISCELLANEOUS) ×2 IMPLANT
ELECT REM PT RETURN 9FT ADLT (ELECTROSURGICAL) ×2
ELECTRODE REM PT RTRN 9FT ADLT (ELECTROSURGICAL) ×1 IMPLANT
EVACUATOR SILICONE 100CC (DRAIN) IMPLANT
GLOVE BIO SURGEON STRL SZ 6.5 (GLOVE) ×4 IMPLANT
GLOVE BIOGEL M STRL SZ7.5 (GLOVE) ×8 IMPLANT
GOWN STRL REUS W/TWL LRG LVL3 (GOWN DISPOSABLE) ×8 IMPLANT
IRRIG SUCT STRYKERFLOW 2 WTIP (MISCELLANEOUS) ×2
IRRIGATION SUCT STRKRFLW 2 WTP (MISCELLANEOUS) ×1 IMPLANT
KIT BASIN OR (CUSTOM PROCEDURE TRAY) ×2 IMPLANT
LOOP VESSEL MAXI BLUE (MISCELLANEOUS) ×2 IMPLANT
NEEDLE INSUFFLATION 14GA 120MM (NEEDLE) ×2 IMPLANT
PORT ACCESS TROCAR AIRSEAL 12 (TROCAR) ×1 IMPLANT
PORT ACCESS TROCAR AIRSEAL 5M (TROCAR) ×1
RELOAD STAPLER WHITE 60MM (STAPLE) IMPLANT
RELOAD WH ECHELON 45 (STAPLE) ×8 IMPLANT
SEAL CANN UNIV 5-8 DVNC XI (MISCELLANEOUS) ×4 IMPLANT
SEAL XI 5MM-8MM UNIVERSAL (MISCELLANEOUS) ×4
SET TRI-LUMEN FLTR TB AIRSEAL (TUBING) ×2 IMPLANT
SOLUTION ELECTROLUBE (MISCELLANEOUS) ×2 IMPLANT
SPONGE LAP 4X18 X RAY DECT (DISPOSABLE) ×2 IMPLANT
STAPLE ECHEON FLEX 60 POW ENDO (STAPLE) IMPLANT
STAPLER RELOAD WHITE 60MM (STAPLE)
SUT ETHILON 3 0 PS 1 (SUTURE) IMPLANT
SUT MNCRL AB 4-0 PS2 18 (SUTURE) ×4 IMPLANT
SUT PDS AB 1 CT1 27 (SUTURE) ×6 IMPLANT
SUT VICRYL 0 UR6 27IN ABS (SUTURE) IMPLANT
TAPE STRIPS DRAPE STRL (GAUZE/BANDAGES/DRESSINGS) IMPLANT
TOWEL OR NON WOVEN STRL DISP B (DISPOSABLE) ×2 IMPLANT
TRAY FOLEY W/METER SILVER 16FR (SET/KITS/TRAYS/PACK) ×2 IMPLANT
TRAY LAPAROSCOPIC (CUSTOM PROCEDURE TRAY) ×2 IMPLANT
TROCAR BLADELESS OPT 12M 100M (ENDOMECHANICALS) ×2 IMPLANT
TROCAR BLADELESS OPT 5 100 (ENDOMECHANICALS) ×2 IMPLANT
TROCAR UNIVERSAL OPT 12M 100M (ENDOMECHANICALS) ×2 IMPLANT
TROCAR XCEL 12X100 BLDLESS (ENDOMECHANICALS) ×2 IMPLANT
WATER STERILE IRR 1500ML POUR (IV SOLUTION) IMPLANT

## 2016-02-22 NOTE — Brief Op Note (Signed)
02/22/2016  11:11 AM  PATIENT:  Titus Mould  61 y.o. male  PRE-OPERATIVE DIAGNOSIS:  RIGHT RENAL MASS  POST-OPERATIVE DIAGNOSIS:  RIGHT RENAL MASS  PROCEDURE:  Procedure(s): XI ROBOTIC ASSISTED LAPAROSCOPIC NEPHRECTOMY (Right)  SURGEON:  Surgeon(s) and Role:    * Alexis Frock, MD - Primary  PHYSICIAN ASSISTANT:   ASSISTANTS: Clemetine Marker PA   ANESTHESIA:   general  EBL:  Total I/O In: 2400 [I.V.:2400] Out: 150 [Urine:125; Blood:25]  BLOOD ADMINISTERED:none  DRAINS: foley to gravity   LOCAL MEDICATIONS USED:  MARCAINE     SPECIMEN:  Source of Specimen:  Rt radical nephrectomy  DISPOSITION OF SPECIMEN:  PATHOLOGY  COUNTS:  YES  TOURNIQUET:  * No tourniquets in log *  DICTATION: .Other Dictation: Dictation Number [id number]  PLAN OF CARE: Admit to inpatient   PATIENT DISPOSITION:  PACU - hemodynamically stable.   Delay start of Pharmacological VTE agent (>24hrs) due to surgical blood loss or risk of bleeding: yes

## 2016-02-22 NOTE — Op Note (Signed)
[name], [name]               ACCOUNT NO.:  [id number]  MEDICAL RECORD NO.:  [id number]  LOCATION:                                 FACILITY:  PHYSICIAN:  Alexis Frock, MD     DATE OF BIRTH:  02-27-1955  DATE OF PROCEDURE: 02/22/2016                              OPERATIVE REPORT   DIAGNOSIS:  Right renal mass.  PROCEDURE:  Right robotic radical nephrectomy.  ESTIMATED BLOOD LOSS:  25 mL.  COMPLICATION:  None.  SPECIMENS:  Right radical nephrectomy.  ASSISTANT:  Debbrah Alar, PA.  FINDINGS:  Two artery, one vein, right renovascular anatomy as anticipated.  DRAINS:  Foley catheter to straight drain.  INDICATION:  [name] is a very pleasant 61 year old gentleman, who was found during admission for bacterial meningitis incidentally to have an approximately 5 cm right renal mass.  This was somewhat endophytic with a large interface with the renal parenchyma, likely T1b.  Options were discussed for management including surveillance protocols versus ablative therapies versus surgical extirpation with and without nephron sparing.  We both agreed that given the size of the tumor and his overall excellent renal function and normal contralateral kidney that radical nephrectomy would be most prudent, and he wished to proceed. Informed consent was obtained and placed in the medical record.  PROCEDURE IN DETAIL:  The patient being [name] verified. Procedure being right radical nephrectomy was confirmed.  Procedure was carried out.  Time-out was performed.  Intravenous antibiotics were administered.  General endotracheal anesthesia introduced.  The patient was placed into a right side up, full flank position, applying 15 degrees of table flexion, superior arm elevator, axillary roll, sequential pressure devices, bottom leg bent, top leg straight.  He was further fashioned to the operating table using 3-inch tape over foam padding across his supraxiphoid chest and his  pelvis.  Foley catheter had been placed per urethra to straight drain.  Sterile field was created by first clipper shaving, then prepping and draping the patient's entire right flank using chlorhexidine gluconate.  A high- flow, low-pressure pneumoperitoneum was obtained using Veress technique in the right lower quadrant having passed the aspiration and drop test. An 8-mm robotic camera port was placed in position approximately 1 handbreadth superolateral to the umbilicus.  Laparoscopic examination of the peritoneal cavity revealed no significant adhesions and no visceral injury.  Next, a 5 mm subxiphoid port was placed just lateral to the falciform, through which a blunt self-locking laparoscopic grasper was used to elevate the liver on superior traction, which exposed the anterior surface of Gerota's.  Additional ports were placed as follows: Right subcostal 8-mm robotic port, right far lateral 8-mm robotic port, 4 fingerbreadths superomedial to the anterior superior iliac spine. Right paramedian inferior robotic port approximately 1 handbreadth superior to the pubic ramus and two 12-mm assistant ports in the midline, one approximately 2 fingerbreadths above the camera port, one 2 fingerbreadths below with the lower being an AirSeal type.  Robot was docked and passed through the electronic checks.  Initial attention was directed at development of the retroperitoneum.  Incision was made lateral to the ascending colon from the area of the cecum towards the area of  the hepatic flexure.  This was carefully mobilized medially. The duodenum was encountered and carefully kocherized medially.  The lower pole of the kidney was identified, placed on gentle lateral traction.  Dissection proceeded medial to this.  The ureter and gonadal were both visualized, placed on gentle lateral traction.  Dissection proceeded within this triangle towards the area of the renal hilum. Renal hilum was somewhat  complex, consisted of a dominant superior artery, lower accessory artery, and 1 vein as anticipated.  These structures very carefully circumferentially mobilized.  The arteries were individually clipped using extra large clip proximally giving the anatomy with the vein being most inferior.  The vein was then taken using vascular load stapler.  This provided very good visualization of the remainder of the arteries.  The smaller inferior artery was then controlled using vascular stapler distal to the previously applied clip and finally, the superior artery also controlled using additional clips x2 proximally, 1 distally.  Anterior attachments were taken down using cautery scissors as were lateral attachments.  Ureters doubly clipped and ligated.  The interface between the adrenal and kidney was separated using vascular stapler.  This completely freed up the right radical nephrectomy.  Specimen was placed in an EndoCatch bag for later retrieval.  Hemostasis appeared excellent.  Sponge and needle counts were correct.  Robot was then undocked.  Specimen was retrieved by connecting the 2 previous 12-mm assistant port sites in the midline and removing the radical nephrectomy specimen and set aside for permanent pathology.  This site was closed at the level of the fascia using figure- of-eight PDS x6.  All incision sites were infiltrated with dilute lyophilized Marcaine.  The extraction site was further reapproximated at the level of Scarpa's using running Vicryl.  All incision sites were closed at the level of skin using subcuticular Monocryl, followed by Dermabond, and the procedure terminated.  The patient tolerated procedure well.  No immediate periprocedural complications.  The patient was taken to the postanesthesia care unit in stable condition.  Please note surgical assistant, Debbrah Alar, was absolutely crucial for all portions of the procedure today, providing suction and  retraction, vascular stapling, vascular clipping throughout the robotic portions.    ______________________________ Alexis Frock, MD   ______________________________ Alexis Frock, MD    TM/MEDQ  D:  02/22/2016  T:  02/22/2016  Job:  [id number]

## 2016-02-22 NOTE — Discharge Summary (Signed)
Date of admission: 02/22/2016  Date of discharge: 02/23/2016  Admission diagnosis: Right renal mass  Discharge diagnosis: Right renal mass  Secondary diagnoses: none  History and Physical: For full details, please see admission history and physical. Briefly, [name] is a 61 y.o. year old patient with right renal mass.   Hospital Course: [name] is s/p right robot assisted laparoscopic radical nephrectomy with Dr. [name] on 02/22/16. He did well post-operatively. His diet was slowly advanced and at the time of discharge he was tolerating a regular diet, ambulating at his baseline, was voiding spontaneously after foley catheter removal, and pain was well controlled with oral narcotics. He was discharged to home on POD#1.  General: Alert and oriented. CV: RRR Lungs: Normal work of breathing on RA GI: Soft, Nondistended. Appropriately tender Incisions: Clean and dry. No erythema or drainage Extremities: Nontender, no erythema, no edema.  Laboratory values:   Recent Labs  02/22/16 1208 02/23/16 0452  HGB 13.5 13.5  HCT 39.1 39.8    Recent Labs  02/23/16 0452  CREATININE 1.51*    Disposition: Home  Discharge instruction: The patient was instructed to be ambulatory but told to refrain from heavy lifting, strenuous activity, or driving.   Discharge medications:  Allergies as of 02/23/2016   No Known Allergies     Medication List    TAKE these medications   buPROPion 150 MG 24 hr tablet Commonly known as:  WELLBUTRIN XL Take 150 mg by mouth daily.   COSAMIN DS PO Take 1 tablet by mouth at bedtime.   finasteride 5 MG tablet Commonly known as:  PROSCAR Take 1 tablet (5 mg total) by mouth daily.   FISH OIL PO Take by mouth.   HYDROcodone-acetaminophen 5-325 MG tablet Commonly known as:  NORCO Take 1-2 tablets by mouth every 4 (four) hours as needed for moderate pain.   Melatonin 5 MG Tabs Take 5 mg by mouth at bedtime.   metoprolol tartrate 25 MG  tablet Commonly known as:  LOPRESSOR Take 1 tablet (25 mg total) by mouth 2 (two) times daily.   multivitamin with minerals Tabs tablet Take 1 tablet by mouth daily.   rosuvastatin 10 MG tablet Commonly known as:  CRESTOR Take 1 tablet (10 mg total) by mouth daily. What changed:  when to take this   TART CHERRY ADVANCED PO Take 1 tablet by mouth at bedtime.   TYLENOL PM EXTRA STRENGTH 25-500 MG Tabs tablet Generic drug:  diphenhydramine-acetaminophen Take 2 tablets by mouth at bedtime.   Vitamin D3 2000 units Tabs Take 2,000 Units by mouth daily.       Followup:  Follow-up Information    Alexis Frock, MD On 03/06/2016.   Specialty:  Urology Why:  at 2:15 for MD visit. Dr. [name] will call you with pathology results when available.  Contact information: [place] [address] [phone]          I have seen and examined the patient and agree he is ready for discharge as outlined here and in progress note from same date.

## 2016-02-22 NOTE — Anesthesia Preprocedure Evaluation (Addendum)
Anesthesia Evaluation  Patient identified by MRN, date of birth, ID band Patient awake    Reviewed: Allergy & Precautions, NPO status , Patient's Chart, lab work & pertinent test results  Airway Mallampati: II  TM Distance: >3 FB Neck ROM: Full    Dental no notable dental hx. (+) Caps, Teeth Intact   Pulmonary neg pulmonary ROS,    Pulmonary exam normal breath sounds clear to auscultation       Cardiovascular hypertension, Pt. 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 affected as well as short term memory No recent seizures CVA, Residual Symptoms    GI/Hepatic negative GI ROS, (+)     substance abuse  alcohol use, Hx/o ETOH abuse   Endo/Other  Hyperlipidemia Obesity  Renal/GU Renal InsufficiencyRenal diseaseRight Renal mass Hx/o Nephrolithiasis  negative genitourinary   Musculoskeletal   Abdominal (+) + obese,   Peds  Hematology Hx/o bacteremia/sepsis   Anesthesia Other Findings   Reproductive/Obstetrics                           Lab Results  Component Value Date   WBC 6.4 02/16/2016   HGB 13.6 02/16/2016   HCT 40.2 02/16/2016   MCV 86.5 02/16/2016   PLT 239 02/16/2016     Chemistry      Component Value Date/Time   NA 147 (H) 02/16/2016 1448   K 4.6 02/16/2016 1448   CL 111 02/16/2016 1448   CO2 29 02/16/2016 1448   BUN 21 (H) 02/16/2016 1448   CREATININE 1.14 02/16/2016 1448      Component Value Date/Time   CALCIUM 9.8 02/16/2016 1448   ALKPHOS 67 11/09/2015 0500   AST 18 11/09/2015 0500   ALT 40 11/09/2015 0500   BILITOT 0.7  11/09/2015 0500     EKG: sinus tachycardia, RBBB, LVH by voltage. Echo 10/30/2015:- Left ventricle: The cavity size was normal. There was mild   concentric hypertrophy. Systolic function was normal. The   estimated ejection fraction was in the range of 60% to 65%. Wall   motion was normal; there were no regional wall motion   abnormalities. Features are consistent with a pseudonormal left   ventricular filling pattern, with concomitant abnormal relaxation   and increased filling pressure (grade 2 diastolic dysfunction).   Doppler parameters are consistent with high ventricular filling   pressure. - Aortic valve: Poorly visualized. There was mild regurgitation. - Aorta: Aortic root dimension: 38 mm (ED). - Aortic root: The aortic root was mildly dilated. - Mitral valve: There is a questionable density on the anterior   mitral valve leaflet but images are not adequate enough to   discern whether this might be a vegetation. There is evidence of   mitral valve prolapse and mild MR. Recommend TEE for further   evaluation.  Anesthesia Physical Anesthesia Plan  ASA: III  Anesthesia Plan: General   Post-op Pain Management:    Induction: Intravenous  Airway Management Planned: Oral ETT  Additional Equipment:   Intra-op Plan:   Post-operative Plan: Extubation in OR  Informed Consent: I have reviewed the patients History and Physical, chart, labs and discussed the procedure including the risks, benefits and alternatives for the proposed  anesthesia with the patient or authorized representative who has indicated his/her understanding and acceptance.   Dental advisory given  Plan Discussed with: Anesthesiologist, CRNA and Surgeon  Anesthesia Plan Comments:         Anesthesia Quick Evaluation

## 2016-02-22 NOTE — Addendum Note (Signed)
Addendum  created 02/22/16 1728 by Talbot Grumbling, CRNA   Anesthesia Intra Flowsheets edited

## 2016-02-22 NOTE — H&P (Signed)
[name] is an 61 y.o. male.    Chief Complaint: Pre-op RIGHT radical nephrectomy  HPI:   1 - Right Renal Mass - 5.3cm Rt mid solid enhancing mass c/w localized renal cell carcinoma incidental by CT 10/2015 during episode staph bacteremia / encephalitis. 2 artery / 1 veitn right renovascular anatomy. No contralateral lesions. Rt sided <2cm cyst as well. Cr 0.98. No chest lesions.   2 - Prostate Screening -  2017 - PSA 2.2 / DRE 50gm   3 - Lower Urinary Tract Symptoms / Enlarged Prostate / Hypocontractile Bladder - followed with Tannenbaum for years. Prior TURP years ago. UDS previously hypocontractile with PDet 40 at Qm 9. PVR's 100-300 range x many. No manages with finasteride daily, most day to day bother from nocturia x 4.   PMH sig for MI/CVA 2010 (follows [facility] yearly, not limiting whatsoever), Bacterial Meningitis 2017 (no deficits, gram positives), No prior chest / abd surgeries. No strong blood thinners. His PCP is Marcellus Scott MD.   Today " [name]" is seen to proceed with RIGHT robotic radical nephrectomy.     Past Medical History:  Diagnosis Date  . Bacteremia 110/2017  . Cancer ([place])    renal mass at preop  . CVA (cerebral infarction)    hemmorhagic  CVA  . Depression   . Dyslipidemia   . Endocarditis    MV Vegitation  . History of kidney stones   . Hypertension   . Mitral regurgitation   . Myocardial infarction   . Stroke [facility])     Past Surgical History:  Procedure Laterality Date  . NO PAST SURGERIES    . TONSILLECTOMY      Family History  Problem Relation Age of Onset  . Heart disease Mother     mom RF as a child (died of heart problem in  72 )   Social History:  reports that he has never smoked. He has never used smokeless tobacco. He reports that he drinks about 4.2 oz of alcohol per week . He reports that he uses drugs, including Marijuana.  Allergies: No Known Allergies  No prescriptions prior to admission.    No results found for  this or any previous visit (from the past 48 hour(s)). No results found.  Review of Systems  Constitutional: Negative.   HENT: Negative.   Eyes: Negative.   Respiratory: Negative.   Cardiovascular: Negative.   Gastrointestinal: Negative.   Genitourinary: Negative.  Negative for flank pain and hematuria.  Musculoskeletal: Negative.   Skin: Negative.   Neurological: Negative.   Endo/Heme/Allergies: Negative.   Psychiatric/Behavioral: Negative.     There were no vitals taken for this visit. Physical Exam  Constitutional: He appears well-developed.  HENT:  Head: Normocephalic.  Eyes: Pupils are equal, round, and reactive to light.  Neck: Normal range of motion.  Cardiovascular: Normal rate.   Respiratory: Effort normal.  GI: Soft.  Genitourinary:  Genitourinary Comments: No CVAT at present.   Musculoskeletal: Normal range of motion.  Neurological: He is alert.  Skin: Skin is warm.  Psychiatric: He has a normal mood and affect.     Assessment/Plan  Proceed as planned with RIGHT robotic radical nephrectomy. Risks, benefits, alternatives, expected peri-op course discussed previously and reiterated today.   Alexis Frock, MD 02/22/2016, 3:39 AM

## 2016-02-22 NOTE — Transfer of Care (Signed)
Immediate Anesthesia Transfer of Care Note  Patient: [name]  Procedure(s) Performed: Procedure(s): XI ROBOTIC ASSISTED LAPAROSCOPIC NEPHRECTOMY (Right)  Patient Location: PACU  Anesthesia Type:General  Level of Consciousness:  sedated, patient cooperative and responds to stimulation  Airway & Oxygen Therapy:Patient Spontanous Breathing and Patient connected to face mask oxgen  Post-op Assessment:  Report given to PACU RN and Post -op Vital signs reviewed and stable  Post vital signs:  Reviewed and stable  Last Vitals:  Vitals:   02/22/16 0640  BP: 140/69  Pulse: 82  Resp: 16  Temp: [id number] C    Complications: No apparent anesthesia complications

## 2016-02-22 NOTE — Anesthesia Postprocedure Evaluation (Signed)
Anesthesia Post Note  Patient: Sencere Vanname  Procedure(s) Performed: Procedure(s) (LRB): XI ROBOTIC ASSISTED LAPAROSCOPIC NEPHRECTOMY (Right)  Patient location during evaluation: PACU Anesthesia Type: General Level of consciousness: awake and alert Pain management: pain level controlled Vital Signs Assessment: post-procedure vital signs reviewed and stable Respiratory status: spontaneous breathing, nonlabored ventilation, respiratory function stable and patient connected to nasal cannula oxygen Cardiovascular status: blood pressure returned to baseline and stable Postop Assessment: no signs of nausea or vomiting Anesthetic complications: no       Last Vitals:  Vitals:   02/22/16 1200 02/22/16 1215  BP: (!) 155/86 (!) 142/93  Pulse: 68 66  Resp: 10 10  Temp: 36.4 C     Last Pain:  Vitals:   02/22/16 1215  TempSrc:   PainSc: Asleep                 Sherard Sutch A.

## 2016-02-22 NOTE — Discharge Instructions (Signed)

## 2016-02-22 NOTE — Anesthesia Procedure Notes (Signed)
Procedure Name: Intubation Date/Time: 02/22/2016 8:34 AM Performed by: Talbot Grumbling Pre-anesthesia Checklist: Patient identified, Emergency Drugs available, Suction available and Patient being monitored Patient Re-evaluated:Patient Re-evaluated prior to inductionOxygen Delivery Method: Circle system utilized Preoxygenation: Pre-oxygenation with 100% oxygen Intubation Type: IV induction Ventilation: Mask ventilation without difficulty Laryngoscope Size: Mac and 3 Grade View: Grade I Tube type: Oral Tube size: 8.0 mm Number of attempts: 1 Airway Equipment and Method: Stylet Placement Confirmation: ETT inserted through vocal cords under direct vision,  positive ETCO2 and breath sounds checked- equal and bilateral Secured at: 23 cm Tube secured with: Tape Dental Injury: Teeth and Oropharynx as per pre-operative assessment

## 2016-02-22 NOTE — Plan of Care (Addendum)
Problem: Pain Managment: Goal: General experience of comfort will improve Outcome: Progressing Pain up to 5/10 to abdomen. 87m Oxy IR given. Effective, pain down to 1/10. Continue with plan of care.   Problem: Skin Integrity: Goal: Risk for impaired skin integrity will decrease Outcome: Completed/Met Date Met: 02/22/16 Pt independent with repositioning.   Education on off loading pressure done. Skin intact. Will continue to monitor.    Addendum:  Ambulated 800 feet with supervision.  Steady gait, tolerated well.  Weaned to room air.

## 2016-02-23 ENCOUNTER — Encounter (HOSPITAL_COMMUNITY): Payer: Self-pay | Admitting: Urology

## 2016-02-23 LAB — BASIC METABOLIC PANEL
ANION GAP: 7 (ref 5–15)
BUN: 18 mg/dL (ref 6–20)
CO2: 25 mmol/L (ref 22–32)
Calcium: 9 mg/dL (ref 8.9–10.3)
Chloride: 107 mmol/L (ref 101–111)
Creatinine, Ser: 1.51 mg/dL — ABNORMAL HIGH (ref 0.61–1.24)
GFR calc Af Amer: 57 mL/min — ABNORMAL LOW (ref >60)
GFR, EST NON AFRICAN AMERICAN: 49 mL/min — AB (ref >60)
Glucose, Bld: 121 mg/dL — ABNORMAL HIGH (ref 65–99)
POTASSIUM: 4.6 mmol/L (ref 3.5–5.1)
SODIUM: 139 mmol/L (ref 135–145)

## 2016-02-23 LAB — HEMOGLOBIN AND HEMATOCRIT, BLOOD
HEMATOCRIT: 39.8 % (ref 39.0–52.0)
Hemoglobin: 13.5 g/dL (ref 13.0–17.0)

## 2016-02-23 MED ORDER — TAMSULOSIN HCL 0.4 MG PO CAPS
0.4000 mg | ORAL_CAPSULE | Freq: Once | ORAL | Status: AC
  Administered 2016-02-23: 0.4 mg via ORAL
  Filled 2016-02-23: qty 1

## 2016-02-23 MED ORDER — OXYCODONE HCL 5 MG PO TABS
10.0000 mg | ORAL_TABLET | ORAL | Status: DC | PRN
  Administered 2016-02-23: 10 mg via ORAL
  Filled 2016-02-23: qty 2

## 2016-02-23 MED ORDER — ACETAMINOPHEN 500 MG PO TABS
1000.0000 mg | ORAL_TABLET | Freq: Three times a day (TID) | ORAL | Status: DC
Start: 2016-02-23 — End: 2016-02-23
  Administered 2016-02-23: 1000 mg via ORAL
  Filled 2016-02-23: qty 2

## 2016-02-23 MED ORDER — OXYCODONE HCL 5 MG PO TABS
5.0000 mg | ORAL_TABLET | ORAL | Status: DC | PRN
  Filled 2016-02-23: qty 1

## 2016-02-23 NOTE — Care Management Note (Signed)
Case Management Note  Patient Details  Name: Jamareon Francke MRN: [id number] Date of Birth: 11/09/1955  Subjective/Objective:  Pt admitted with Right renal mass.               Action/Plan: PROCEDURE:  Right robotic radical nephrectomy. Plan to dc home with no needs     Expected Discharge Date:                  Expected Discharge Plan:  Home/Self Care  In-House Referral:  NA  Discharge planning Services  CM Consult  Post Acute Care Choice:  NA Choice offered to:  NA  DME Arranged:    DME Agency:  NA  HH Arranged:  NA HH Agency:  NA  Status of Service:  Completed, signed off  If discussed at [place] of Stay Meetings, dates discussed:    Additional CommentsPurcell Mouton, RN 02/23/2016, 11:16 AM

## 2016-02-23 NOTE — Progress Notes (Signed)
1 Day Post-Op Subjective: The patient is doing well.  No nausea or vomiting with solid food. Pain is adequately controlled. Ambulated twice yesterday. No fllatus/BM yet. Labs stable.  Objective: Vital signs in last 24 hours: Temp:  [96.8 F (36 C)-98 F (36.7 C)] 97.7 F (36.5 C) (02/22 0510) Pulse Rate:  [66-102] 81 (02/22 0510) Resp:  [10-18] 18 (02/22 0510) BP: (138-155)/(70-120) 144/70 (02/22 0510) SpO2:  [96 %-100 %] 100 % (02/22 0510)  Intake/Output from previous day: 02/21 0701 - 02/22 0700 In: 4306.7 [I.V.:4306.7] Out: 2000 [Urine:1975; Blood:25] Intake/Output this shift: No intake/output data recorded.  Physical Exam:  General: Alert and oriented. CV: RRR Lungs: Normal work of breathing on RA GI: Soft, Nondistended. Appropriately tender Incisions: Clean and dry. No erythema or drainage Urine: Clear Extremities: Nontender, no erythema, no edema.  Lab Results:  Recent Labs  02/22/16 1208 02/23/16 0452  HGB 13.5 13.5  HCT 39.1 39.8          Recent Labs  02/16/16 1448 02/23/16 0452  CREATININE 1.14 1.51*           Results for orders placed or performed during the hospital encounter of 02/22/16 (from the past 24 hour(s))  Hemoglobin and hematocrit, blood     Status: None   Collection Time: 02/22/16 12:08 PM  Result Value Ref Range   Hemoglobin 13.5 13.0 - 17.0 g/dL   HCT 39.1 39.0 - 52.0 %  Hemoglobin and hematocrit, blood     Status: None   Collection Time: 02/23/16  4:52 AM  Result Value Ref Range   Hemoglobin 13.5 13.0 - 17.0 g/dL   HCT 39.8 39.0 - [id number] %  Basic metabolic panel     Status: Abnormal   Collection Time: 02/23/16  4:52 AM  Result Value Ref Range   Sodium 139 135 - 145 mmol/L   Potassium 4.6 3.5 - 5.1 mmol/L   Chloride 107 101 - 111 mmol/L   CO2 25 22 - 32 mmol/L   Glucose, Bld 121 (H) 65 - 99 mg/dL   BUN 18 6 - 20 mg/dL   Creatinine, Ser 1.51 (H) 0.61 - 1.24 mg/dL   Calcium 9.0 8.9 - 10.3 mg/dL   GFR calc non Af Amer 49 (L)  >60 mL/min   GFR calc Af Amer 57 (L) >60 mL/min   Anion gap 7 5 - 15    Assessment/Plan: POD# 1 s/p robotic right radical nephrectomy.  1) Ambulate, Incentive spirometry 2) Regular diet 3) Transition to oral pain medication 4) Medlock IVF 5) D/C urethral catheter 6) Discharge later today    LOS: 1 day   Milford Cage Sukhu 02/23/2016, 7:01 AM  I have seen and examined the patient and agree with above.  Briefly,  S: POD 1 s/p right radical nephrectomy. Pain controlled. Tollerating PO.  O:Hbg 13.5, Cr 1.5 NAD, AOx3, wife at bedside Non-labored breathing on room RRR SNTND, Recent port / extractio sites c/d/i. SCD's in place  A/P: Doing well POD 1. Likely DC later today.

## 2016-03-06 DIAGNOSIS — C641 Malignant neoplasm of right kidney, except renal pelvis: Secondary | ICD-10-CM | POA: Diagnosis not present

## 2016-03-26 DIAGNOSIS — I10 Essential (primary) hypertension: Secondary | ICD-10-CM | POA: Diagnosis not present

## 2016-05-02 DIAGNOSIS — I10 Essential (primary) hypertension: Secondary | ICD-10-CM | POA: Diagnosis not present

## 2016-05-10 DIAGNOSIS — I10 Essential (primary) hypertension: Secondary | ICD-10-CM | POA: Diagnosis not present

## 2016-05-10 DIAGNOSIS — R7989 Other specified abnormal findings of blood chemistry: Secondary | ICD-10-CM | POA: Diagnosis not present

## 2016-05-10 DIAGNOSIS — Z905 Acquired absence of kidney: Secondary | ICD-10-CM | POA: Diagnosis not present

## 2016-08-02 DIAGNOSIS — I69919 Unspecified symptoms and signs involving cognitive functions following unspecified cerebrovascular disease: Secondary | ICD-10-CM | POA: Diagnosis not present

## 2016-08-02 DIAGNOSIS — Z79899 Other long term (current) drug therapy: Secondary | ICD-10-CM | POA: Diagnosis not present

## 2016-08-02 DIAGNOSIS — E782 Mixed hyperlipidemia: Secondary | ICD-10-CM | POA: Diagnosis not present

## 2016-08-02 DIAGNOSIS — Z905 Acquired absence of kidney: Secondary | ICD-10-CM | POA: Diagnosis not present

## 2016-08-02 DIAGNOSIS — N400 Enlarged prostate without lower urinary tract symptoms: Secondary | ICD-10-CM | POA: Diagnosis not present

## 2016-08-02 DIAGNOSIS — I10 Essential (primary) hypertension: Secondary | ICD-10-CM | POA: Diagnosis not present

## 2016-08-02 DIAGNOSIS — E559 Vitamin D deficiency, unspecified: Secondary | ICD-10-CM | POA: Diagnosis not present

## 2016-08-02 DIAGNOSIS — Z0001 Encounter for general adult medical examination with abnormal findings: Secondary | ICD-10-CM | POA: Diagnosis not present

## 2016-08-02 DIAGNOSIS — F09 Unspecified mental disorder due to known physiological condition: Secondary | ICD-10-CM | POA: Diagnosis not present

## 2016-09-14 ENCOUNTER — Ambulatory Visit (HOSPITAL_COMMUNITY)
Admission: RE | Admit: 2016-09-14 | Discharge: 2016-09-14 | Disposition: A | Discharge status: Home / Self Care | Payer: BLUE CROSS/BLUE SHIELD | Source: Ambulatory Visit | Attending: Urology | Admitting: Urology

## 2016-09-14 ENCOUNTER — Other Ambulatory Visit (HOSPITAL_COMMUNITY): Payer: Self-pay | Admitting: Urology

## 2016-09-14 DIAGNOSIS — C641 Malignant neoplasm of right kidney, except renal pelvis: Secondary | ICD-10-CM

## 2016-09-14 DIAGNOSIS — C649 Malignant neoplasm of unspecified kidney, except renal pelvis: Secondary | ICD-10-CM | POA: Diagnosis not present

## 2016-09-14 DIAGNOSIS — K7689 Other specified diseases of liver: Secondary | ICD-10-CM | POA: Diagnosis not present

## 2016-09-14 DIAGNOSIS — R351 Nocturia: Secondary | ICD-10-CM | POA: Diagnosis not present

## 2016-09-18 DIAGNOSIS — N183 Chronic kidney disease, stage 3 (moderate): Secondary | ICD-10-CM | POA: Diagnosis not present

## 2016-09-18 DIAGNOSIS — C641 Malignant neoplasm of right kidney, except renal pelvis: Secondary | ICD-10-CM | POA: Diagnosis not present

## 2016-09-18 DIAGNOSIS — Z905 Acquired absence of kidney: Secondary | ICD-10-CM | POA: Diagnosis not present

## 2016-09-18 DIAGNOSIS — R351 Nocturia: Secondary | ICD-10-CM | POA: Diagnosis not present

## 2016-09-26 DIAGNOSIS — L820 Inflamed seborrheic keratosis: Secondary | ICD-10-CM | POA: Diagnosis not present

## 2016-09-26 DIAGNOSIS — D485 Neoplasm of uncertain behavior of skin: Secondary | ICD-10-CM | POA: Diagnosis not present

## 2016-09-26 DIAGNOSIS — L918 Other hypertrophic disorders of the skin: Secondary | ICD-10-CM | POA: Diagnosis not present

## 2016-10-03 DIAGNOSIS — Z23 Encounter for immunization: Secondary | ICD-10-CM | POA: Diagnosis not present

## 2016-10-10 DIAGNOSIS — F3342 Major depressive disorder, recurrent, in full remission: Secondary | ICD-10-CM | POA: Diagnosis not present

## 2016-11-05 ENCOUNTER — Other Ambulatory Visit: Payer: Self-pay | Admitting: Internal Medicine

## 2016-11-19 DIAGNOSIS — I129 Hypertensive chronic kidney disease with stage 1 through stage 4 chronic kidney disease, or unspecified chronic kidney disease: Secondary | ICD-10-CM | POA: Diagnosis not present

## 2016-11-19 DIAGNOSIS — D631 Anemia in chronic kidney disease: Secondary | ICD-10-CM | POA: Diagnosis not present

## 2016-11-19 DIAGNOSIS — E559 Vitamin D deficiency, unspecified: Secondary | ICD-10-CM | POA: Diagnosis not present

## 2016-11-19 DIAGNOSIS — N183 Chronic kidney disease, stage 3 (moderate): Secondary | ICD-10-CM | POA: Diagnosis not present

## 2016-11-19 DIAGNOSIS — Z905 Acquired absence of kidney: Secondary | ICD-10-CM | POA: Diagnosis not present

## 2016-11-30 ENCOUNTER — Encounter: Payer: Self-pay | Admitting: Internal Medicine

## 2016-11-30 ENCOUNTER — Ambulatory Visit (INDEPENDENT_AMBULATORY_CARE_PROVIDER_SITE_OTHER): Payer: BLUE CROSS/BLUE SHIELD | Admitting: Internal Medicine

## 2016-11-30 VITALS — BP 138/84 | HR 53 | Ht 68.0 in | Wt 215.2 lb

## 2016-11-30 DIAGNOSIS — I10 Essential (primary) hypertension: Secondary | ICD-10-CM

## 2016-11-30 DIAGNOSIS — E782 Mixed hyperlipidemia: Secondary | ICD-10-CM | POA: Diagnosis not present

## 2016-11-30 DIAGNOSIS — I340 Nonrheumatic mitral (valve) insufficiency: Secondary | ICD-10-CM | POA: Diagnosis not present

## 2016-11-30 DIAGNOSIS — Z7689 Persons encountering health services in other specified circumstances: Secondary | ICD-10-CM | POA: Diagnosis not present

## 2016-11-30 DIAGNOSIS — I341 Nonrheumatic mitral (valve) prolapse: Secondary | ICD-10-CM

## 2016-11-30 NOTE — Progress Notes (Signed)
Cardiology Office Note   Date:  11/30/2016   ID:  [name], DOB 04/29/1955, MRN [id number]  PCP:  Maurice Huddle, MD  Cardiologist:   Maurice Carnes, MD    F/U of MV dz     History of Present Illness: [name] is a 61 y.o. male with a history of endocarditis in 8341  Complicated by MI and also emobokc event to brain with resultant CVA then bleed  I saw him last in October 2017    After I saw him he was admitted with sepsis/meningitis  TEE was done showing thickened MV but no vegetation  Mild to mod MR  Prolapse noted  While undergoing work up for infection he was incidentally found to have a rena lmass  Found to have renal cell CA  Underwent nephrectomy in Feb 2018  The pt denies CP  Breathing is OK No palpitations    Seen by [name] for renal function  Referred to nutrition for wt loss and protein counselling   Knows he has to walk more      Last echo in July 2011 bileaflet prolapse with mod MR      Outpatient Medications Prior to Visit  Medication Sig Dispense Refill  . buPROPion (WELLBUTRIN XL) 150 MG 24 hr tablet Take 150 mg by mouth daily.    . Cholecalciferol (VITAMIN D3) 2000 units TABS Take 2,000 Units by mouth daily.    . diphenhydramine-acetaminophen (TYLENOL PM EXTRA STRENGTH) 25-500 MG TABS tablet Take 2 tablets by mouth at bedtime.    . finasteride (PROSCAR) 5 MG tablet Take 1 tablet (5 mg total) by mouth daily. 30 tablet 0  . Glucosamine-Chondroitin (COSAMIN DS PO) Take 1 tablet by mouth at bedtime.    . Melatonin 5 MG TABS Take 5 mg by mouth at bedtime.    . metoprolol tartrate (LOPRESSOR) 25 MG tablet Take 1 tablet (25 mg total) by mouth 2 (two) times daily. 60 tablet 0  . Misc Natural Products (TART CHERRY ADVANCED PO) Take 1 tablet by mouth at bedtime.    . Multiple Vitamin (MULTIVITAMIN WITH MINERALS) TABS tablet Take 1 tablet by mouth daily.    . Omega-3 Fatty Acids (FISH OIL PO) Take by mouth.    . rosuvastatin (CRESTOR) 10 MG tablet Take 1  tablet (10 mg total) daily by mouth. Please keep upcoming appt for future refills. Thank you 90 tablet 0  . HYDROcodone-acetaminophen (NORCO) 5-325 MG tablet Take 1-2 tablets by mouth every 4 (four) hours as needed for moderate pain. (Patient not taking: Reported on 11/30/2016) 30 tablet 0   No facility-administered medications prior to visit.      Allergies:   Patient has no known allergies.   Past Medical History:  Diagnosis Date  . Bacteremia 110/2017  . Cancer ([place])    renal mass at preop  . CVA (cerebral infarction)    hemmorhagic  CVA  . Depression   . Dyslipidemia   . Endocarditis    MV Vegitation  . History of kidney stones   . Hypertension   . Mitral regurgitation   . Myocardial infarction ([place])   . Stroke [facility])     Past Surgical History:  Procedure Laterality Date  . NO PAST SURGERIES    . ROBOT ASSISTED LAPAROSCOPIC NEPHRECTOMY Right 02/22/2016   Procedure: XI ROBOTIC ASSISTED LAPAROSCOPIC NEPHRECTOMY;  Surgeon: Maurice Frock, MD;  Location: [facility] ORS;  Service: Urology;  Laterality: Right;  . TONSILLECTOMY  Social History:  The patient  reports that  has never smoked. he has never used smokeless tobacco. He reports that he drinks about 4.2 oz of alcohol per week. He reports that he uses drugs. Drug: Marijuana.   Family History:  The patient's family history includes Heart disease in his mother.    ROS:  Please see the history of present illness. All other systems are reviewed and  Negative to the above problem except as noted.    PHYSICAL EXAM: VS:  BP 138/84   Pulse (!) 53   Ht 5\' 8"  (1.727 m)   Wt 215 lb 3.2 oz (97.6 kg)   BMI 32.72 kg/m   GEN: Obese 61 yo in no acute distress  HEENT: normal  Neck: JVP normal  , carotid bruits, or masses Cardiac: RRR; no murmurs, rubs, or gallops,no edema  Respiratory:  clear to auscultation bilaterally, normal work of breathing GI: soft, nontender, nondistended, + BS  No hepatomegaly  MS: no deformity Moving  all extremities   Skin: warm and dry, no rash Neuro:  Strength and sensation are intact Psych: euthymic mood, full affect   EKG:  EKG is ordered today.     SB 53  RBBB  LHV     Lipid Panel    Component Value Date/Time   CHOL 241 (H) 01/19/2010 0904   TRIG 286 (H) 10/31/2015 0111   HDL 56.10 01/19/2010 0904   CHOLHDL 4 01/19/2010 0904   VLDL 22.4 01/19/2010 0904   LDLCALC 94 01/13/2009 0842   LDLDIRECT 164.1 01/19/2010 0904      Wt Readings from Last 3 Encounters:  11/30/16 215 lb 3.2 oz (97.6 kg)  02/22/16 202 lb (91.6 kg)  02/16/16 202 lb (91.6 kg)      ASSESSMENT AND PLAN:  1  MV dz  There is no murmur on exam   I would follow for now  I would hold off on scheduling another echo    2  HL  Keep on Crestor  Will need lipids followed     3  BP  Follow  Diastolic is  mldly increased at 86   4  Renal  Follow with [name]  5  Obesity  Will referr to dietary  Increase activity   ENcouraged him to stay actvive  INcrease walking   Plan for f/u in 1 year       Current medicines are reviewed at length with the patient today.  The patient does not have concerns regarding medicines.  Signed, Maurice Carnes, MD  11/30/2016 3:32 PM    [facility] [address], [place], [place]  [address] Phone: [phone]; Fax: [phone]

## 2016-11-30 NOTE — Patient Instructions (Addendum)
Your physician recommends that you continue on your current medications as directed. Please refer to the Current Medication list given to you today.  You have been referred to  Nutrition.    Your physician wants you to follow-up in: 9 months with [name].   You will receive a reminder letter in the mail two months in advance. If you don't receive a letter, please call our office to schedule the follow-up appointment.

## 2016-12-07 DIAGNOSIS — I10 Essential (primary) hypertension: Secondary | ICD-10-CM | POA: Diagnosis not present

## 2016-12-07 DIAGNOSIS — L089 Local infection of the skin and subcutaneous tissue, unspecified: Secondary | ICD-10-CM | POA: Diagnosis not present

## 2016-12-26 DIAGNOSIS — I10 Essential (primary) hypertension: Secondary | ICD-10-CM | POA: Diagnosis not present

## 2017-01-03 ENCOUNTER — Encounter: Payer: BLUE CROSS/BLUE SHIELD | Attending: Internal Medicine | Admitting: Dietician

## 2017-01-03 ENCOUNTER — Encounter: Payer: Self-pay | Admitting: Dietician

## 2017-01-03 DIAGNOSIS — Z7689 Persons encountering health services in other specified circumstances: Secondary | ICD-10-CM | POA: Diagnosis not present

## 2017-01-03 DIAGNOSIS — Z713 Dietary counseling and surveillance: Secondary | ICD-10-CM | POA: Diagnosis not present

## 2017-01-03 DIAGNOSIS — Z6832 Body mass index (BMI) 32.0-32.9, adult: Secondary | ICD-10-CM

## 2017-01-03 DIAGNOSIS — I10 Essential (primary) hypertension: Secondary | ICD-10-CM | POA: Diagnosis not present

## 2017-01-03 DIAGNOSIS — Z6838 Body mass index (BMI) 38.0-38.9, adult: Secondary | ICD-10-CM

## 2017-01-03 DIAGNOSIS — E6609 Other obesity due to excess calories: Secondary | ICD-10-CM

## 2017-01-03 NOTE — Progress Notes (Signed)
Medical Nutrition Therapy:  Appt start time: 1330 end time:  1430.   Assessment:  Primary concerns today: obesity and hypertension.  History of cancer and nephrectomy in March 2018.  Patient states his goals are to lose weight and bring his blood pressure down which he currently checks 2x/day.  He is not weighing at home.  His current weight is 215 lbs and he states that his weight loss goal is 40 lbs.  His lowest adult weight was 138 lbs after being in the hospital with feeding tube.  Felt best at 150 lbs about 30 years ago, and states that his realistic goal would be 170-180 lbs.  He has been working on small, attainable goals like drinking more water and getting into a regular exercise routine.  He has worked with a Microbiologist through his kidney doctor and they have discussed eating less salt, having smaller portions, eating less meat, more veggies, and more fish as well as adding in one serving of nuts per day.  He reports that his wife does the majority of food cooking and they both do the food shopping.  He eats out about 7-10x/week for 2-3 breakfast meals, 3-4 lunch meals and 2-3 dinner meals to [employer]18 or [employer] or for [place] food.  Invited his wife to attend his next visit.  Preferred Learning Style:  No preference indicated   Learning Readiness:  Contemplating long-term food habit changes  Ready for small, attainable food habit changes  Change in progress for incorporating regular physical activity into his routine   MEDICATIONS: see list   DIETARY INTAKE:  Usual eating pattern includes 3 meals and 1 snack per day.  Everyday foods include bread, wine, cheese.  Avoided foods include some veggies, trying to reduce meat intake.    24-hr recall:  B (10 AM): bagel thin with goat cheese, iced americano coffee from [employer] with honey and skim milk, 1/2 glass of OJ  Snk ( AM): usually none L (2 PM): tuna fish (grilled) sandwich or [place] sandwich on [employer] bread, Diet Coke Snk  (5:30 PM): pistachios or trail mix with chocolate before dinner with glass of red wine D (7:30-8 PM): leftover chicken and white bean chili, water Snk ( PM): one 2" brownie before bed Beverages: 1 glass of water per day - trying to increase to 3 per day before meals, occasionally half and half tea  Usual physical activity: walking dog 2x/day for 3 miles total per day for the last 6 months, last 2-3 months going to the gym every other day, doing elliptical and some light weight lifting, has worked with a Physiological scientist in the past  Progress Towards Goal(s):  In progress.   Nutritional Diagnosis:  NB-1.1 Food and nutrition-related knowledge deficit As related to no prior formal nutrition education.  As evidenced by patient request for dietary advice to lose weight and reduce blood pressure.    Intervention:  Nutrition education and counseling. Today we discussed his goals and progress thus far, gathered diet recall, discussed mindful eating, nutrition label reading, sustainable rates of weight loss, and assessed his readiness to change.  He is familiar with food journaling and the plate method.  Discussed ways to utilize these tools for lasting behavior change.  Provided and reviewed handouts on hypertension and the DASH diet.  He is interested in learning more about the DASH diet at our next visit.  Goals: Drink one glass of water before each meal Continue physical activity routine aiming for at least 150  min or more per week of moderate intensity exercise (brisk walking) Read Nutrition Labels for sodium and choose low-sodium or no-salt-added foods when possible Continue to brainstorm ways to reduce salt intake at restaurants - like requesting your food be made without added salt Talk with your wife about lasting changes you would both like to create within your eating style/routines Practice mindful eating - slow down and enjoy meals, lasting at least 20 min, without distraction (TV) at  mealtimes  Teaching Method Utilized: Visual Auditory  Handouts given during visit include:  Hypertension  Low Sodium Flavoring Options  Barriers to learning/adherence to lifestyle change: none  Demonstrated degree of understanding via:  Teach Back   Monitoring/Evaluation:  Dietary intake, exercise, blood pressure readings, labs, and body weight in 6 week(s).

## 2017-01-04 DIAGNOSIS — I10 Essential (primary) hypertension: Secondary | ICD-10-CM | POA: Diagnosis not present

## 2017-02-07 DIAGNOSIS — I10 Essential (primary) hypertension: Secondary | ICD-10-CM | POA: Diagnosis not present

## 2017-02-07 DIAGNOSIS — N529 Male erectile dysfunction, unspecified: Secondary | ICD-10-CM | POA: Diagnosis not present

## 2017-02-07 DIAGNOSIS — E559 Vitamin D deficiency, unspecified: Secondary | ICD-10-CM | POA: Diagnosis not present

## 2017-02-07 DIAGNOSIS — F09 Unspecified mental disorder due to known physiological condition: Secondary | ICD-10-CM | POA: Diagnosis not present

## 2017-02-07 DIAGNOSIS — R7989 Other specified abnormal findings of blood chemistry: Secondary | ICD-10-CM | POA: Diagnosis not present

## 2017-02-07 DIAGNOSIS — Z0001 Encounter for general adult medical examination with abnormal findings: Secondary | ICD-10-CM | POA: Diagnosis not present

## 2017-02-08 ENCOUNTER — Other Ambulatory Visit: Payer: Self-pay | Admitting: Internal Medicine

## 2017-02-14 ENCOUNTER — Encounter: Payer: Self-pay | Admitting: Dietician

## 2017-02-14 ENCOUNTER — Encounter: Payer: BLUE CROSS/BLUE SHIELD | Attending: Internal Medicine | Admitting: Dietician

## 2017-02-14 DIAGNOSIS — I10 Essential (primary) hypertension: Secondary | ICD-10-CM

## 2017-02-14 DIAGNOSIS — Z713 Dietary counseling and surveillance: Secondary | ICD-10-CM | POA: Insufficient documentation

## 2017-02-14 DIAGNOSIS — E6609 Other obesity due to excess calories: Secondary | ICD-10-CM

## 2017-02-14 DIAGNOSIS — Z7689 Persons encountering health services in other specified circumstances: Secondary | ICD-10-CM | POA: Insufficient documentation

## 2017-02-14 DIAGNOSIS — Z6831 Body mass index (BMI) 31.0-31.9, adult: Secondary | ICD-10-CM

## 2017-02-14 NOTE — Progress Notes (Signed)
  Medical Nutrition Therapy:  Appt start time: 1430 end time:  1950.   Assessment:  Primary concerns today: follow-up for obesity and hypertension.  History of cancer and nephrectomy in March 2018.  Patient has lost approximately 3 lbs since our last visit 6 weeks ago.  He was on vacation during that time and ate out for almost every meal, also did a fair amount of walking and climbing stairs.  He states that he has not been very motivated since getting back home but has been trying to get back into his exercise routine at the gym.  Continues to walk his dog but states it is at a slow pace.  We reviewed his goals discussed at last visit.  He was agreeable to writing down 1-2 SMART goals today and choosing a way to monitor his progress.  The following goals were agreed upon:  Goal Setting: - Have three 16 oz glasses of water per day (48 oz total) - Limit eating out for lunch to 3 times per week - Keep track of goals using a check mark system on your calendar - Bring calendar tracker with you to your next nutrition visit   Learning Readiness:  Contemplating long-term food habit changes  Ready for small, attainable food habit changes  Change in progress for incorporating regular physical activity into his routine  Open to discussing more about the DASH diet eating style (will also present options for the Mediterranean eating style)   MEDICATIONS: see list   DIETARY INTAKE:  Usual eating pattern includes 3 meals and 1 snack per day.  Everyday foods include bread, wine, cheese.  Avoided foods include some veggies, trying to reduce meat intake.    24-hr recall:  B (10 AM): bagel thin with goat cheese, iced americano coffee from [employer] with honey and skim milk, 1/2 glass of OJ  Snk ( AM): usually none L (2 PM): tuna fish (grilled) sandwich or [place] sandwich on [employer] bread, Diet Coke Snk (5:30 PM): pistachios or trail mix with chocolate before dinner with 1-2 glasses of red wine D  (7:30-8 PM): leftover chicken and white bean chili, water Snk ( PM): one 2" brownie before bed Beverages: 1 glass of water per day - trying to increase to 3 per day before meals, occasionally half and half tea  Usual physical activity: walking dog 2x/day for 3 miles total per day, going to the gym every other day, doing elliptical and some light weight lifting  Progress Towards Goal(s):  In progress.  Barriers to learning/adherence to lifestyle change: none  Demonstrated degree of understanding via:  Teach Back   Monitoring/Evaluation:  Dietary intake, exercise, goal tracking, blood pressure readings, labs, and body weight in 4 week(s).

## 2017-02-18 DIAGNOSIS — N183 Chronic kidney disease, stage 3 (moderate): Secondary | ICD-10-CM | POA: Diagnosis not present

## 2017-03-14 ENCOUNTER — Encounter: Payer: Self-pay | Admitting: Dietician

## 2017-03-14 ENCOUNTER — Encounter: Payer: BLUE CROSS/BLUE SHIELD | Attending: Internal Medicine | Admitting: Dietician

## 2017-03-14 DIAGNOSIS — I10 Essential (primary) hypertension: Secondary | ICD-10-CM | POA: Insufficient documentation

## 2017-03-14 DIAGNOSIS — Z713 Dietary counseling and surveillance: Secondary | ICD-10-CM | POA: Diagnosis not present

## 2017-03-14 DIAGNOSIS — Z7689 Persons encountering health services in other specified circumstances: Secondary | ICD-10-CM | POA: Diagnosis not present

## 2017-03-14 DIAGNOSIS — E669 Obesity, unspecified: Secondary | ICD-10-CM

## 2017-03-14 DIAGNOSIS — Z6831 Body mass index (BMI) 31.0-31.9, adult: Secondary | ICD-10-CM

## 2017-03-14 NOTE — Progress Notes (Signed)
Medical Nutrition Therapy:  Appt start time: 1430 end time:  1500.   Assessment:  Primary concerns today: follow-up for obesity and hypertension.  History of cancer and nephrectomy in March 2018.  We reviewed patient goals discussed at last visit.  He has made slow, consistent progress.  He is still checking his blood pressure daily at home.  States he is reaching his water goal 75-80% of the time.  Thinks that he should concentrate on meeting this goal earlier in the day to limit its effect on him having to get up in the middle of the night to urinate.  Also doing well with eating lunch at home more often, eating out to lunch 3-4 times per week.  His wife recently had surgery and will have her arm in a sling for 6 weeks while recovering.  He states he may be eating out more during that time.  He did start tracking his goals but forgot and then stopped.  He would like to try tracking his water goals again.  We discussed his attitude and motivation around making habit changes for his health.  He states he is overall a very low-stress, content guy and just doesn't have much interest in learning about nutrition although he does want to stay healthy and lose weight.  Used motivational interviewing to enhance change talk and highlight autonomy in his decisions related to the goals and food habits we discuss.  Will continue to encourage the pursuit of very small, achievable goals.  He was interested in adding on an additional goal this week and chose to focus on his protein choices - limiting highly processed, animal sources and increasing plant-based proteins and healthier animal sources.  Provided handout on "The Protein Continuum" from the MEDS not [employer] website - about the [employer].  Goal Setting: - Have three 16 oz glasses of water per day (48 oz total) - Limit eating out for lunch to 3 times per week - Choose healthier proteins like beans, nuts, seeds, and fish (2-3 servings of fish and beans each  week) - Keep track of goals using a check mark system on your calendar   Learning Readiness:  Contemplating long-term food habit changes  Ready for small, attainable food habit changes  Change in progress for incorporating regular physical activity into his routine  Open to discussing more about the DASH diet eating style (will also present options for the Mediterranean eating style)  MEDICATIONS: see list   DIETARY INTAKE:  Usual eating pattern includes 3 meals and 1 snack per day.  Everyday foods include bread, wine, cheese.  Avoided foods include some veggies, trying to reduce meat intake.    24-hr recall:  B (10 AM): bagel thin with goat cheese, iced americano coffee from [employer] with honey and skim milk, 1/2 glass of OJ  Snk ( AM): usually none L (2 PM): tuna fish (grilled) sandwich or [place] sandwich on [employer] bread, Diet Coke Snk (5:30 PM): pistachios or trail mix with chocolate before dinner with 1-2 glasses of red wine D (7:30-8 PM): leftover chicken and white bean chili, water Snk ( PM): one 2" brownie before bed Beverages: 1 glass of water per day - trying to increase to 3 per day before meals, occasionally half and half tea  Usual physical activity: walking dog 2x/day for 3 miles total per day, going to the gym every other day, doing elliptical and some light weight lifting  Progress Towards Goal(s):  In progress.  Barriers to learning/adherence  to lifestyle change: none  Materials given: The Protein Continuum  Demonstrated degree of understanding via:  Teach Back   Monitoring/Evaluation:  Dietary intake, exercise, goal tracking, blood pressure readings, labs, and body weight in 4 week(s).

## 2017-04-18 ENCOUNTER — Encounter: Payer: BLUE CROSS/BLUE SHIELD | Attending: Internal Medicine | Admitting: Dietician

## 2017-04-18 DIAGNOSIS — I10 Essential (primary) hypertension: Secondary | ICD-10-CM | POA: Diagnosis not present

## 2017-04-18 DIAGNOSIS — Z7689 Persons encountering health services in other specified circumstances: Secondary | ICD-10-CM | POA: Diagnosis not present

## 2017-04-18 DIAGNOSIS — Z6831 Body mass index (BMI) 31.0-31.9, adult: Secondary | ICD-10-CM

## 2017-04-18 DIAGNOSIS — E6609 Other obesity due to excess calories: Secondary | ICD-10-CM

## 2017-04-18 DIAGNOSIS — Z713 Dietary counseling and surveillance: Secondary | ICD-10-CM | POA: Diagnosis not present

## 2017-04-18 NOTE — Progress Notes (Signed)
  Medical Nutrition Therapy:  Appt start time: 1430 end time:  1500.   Assessment:  Primary concerns today: follow-up for obesity and hypertension.  History of cancer and nephrectomy in March 2018.  He has maintained his weight since our last visit 1 month ago.  We reviewed patient goals discussed at last visit.  He is still checking his blood pressure daily at home.  Yesterday was 129/71.  Eating out for lunch 5x/week.  Water 2 glasses per day.  Has increased time and activities for his gym routine.  Desires weight loss but reports low motivation level due to lack of results.  He states he has been making gradual changes but not feeling any different.  We continued to explore his attitude and motivation around making habit changes for his health.  We updated his dietary recall and he is able to identify foods high in sodium.  He wants to continue focusing on his small, measurable goals as set.  He agreed to tracking his progress using the handout provided.  We discussed his weight loss expectations vs. the amount of effort he is willing to put in.  He identified that his short-term weight-loss expectations were unrealistic and discovered that it will help him to focus on a long-term goal.  We discussed that a 10-20 lb. weight loss goal over the next 8 months (by Dec 2019) would be appropriate.  He graded himself on his progress with his goals thus far which ranged between a C to an F.  His confidence to meet his goals this next month was rated as a 3-4 on a scale of 1-5 with 5 being confident.   Goal Setting: - Have three 16 oz glasses of water per day (48 oz total) - Limit eating out for lunch to 3 times per week - Choose healthier proteins like beans, nuts, seeds, and fish (2-3 servings of fish and beans each week) - avoid processed, fatty meats - Keep track of goals using the Action Steps Tracker   Learning Readiness:  Ready for small, attainable food habit changes  Change in progress for  incorporating regular physical activity into his routine  MEDICATIONS: see list   DIETARY INTAKE:  Usual eating pattern includes 3 meals and 1-2 snacks per day.  Everyday foods include bread, wine, cheese.  Avoided foods include some veggies, trying to reduce meat intake.    24-hr recall:  B (10 AM): special K cereal with banana, 1/2 glass of OJ  Snk ( AM): usually none L (2 PM): [employer], television/film/video and salad, iced half and half tea Snk (5:30 PM): hummus and pretzels D (7:30-8 PM): Hibachi tempura shrimp with rice and tempura veggies, water and red wine Snk ( PM): one 2" brownie before bed Beverages: 2 glasses of water per day - trying to increase to 3 per day before meals, half and half tea, 1-2 glasses of wine  Usual physical activity: walking dog 2x/day for 3 miles total per day, going to the gym every other day, doing elliptical and some light weight lifting, row machine and swimming  Progress Towards Goal(s):  In progress.  Barriers to learning/adherence to lifestyle change: none  Materials given: Action Steps tracking sheet, Personal goals and recommended reading  Demonstrated degree of understanding via:  Teach Back   Monitoring/Evaluation:  Dietary intake, exercise, goal tracking, blood pressure readings, labs, and body weight in 4 week(s).

## 2017-05-16 ENCOUNTER — Encounter: Payer: Self-pay | Admitting: Dietician

## 2017-05-16 ENCOUNTER — Encounter: Payer: BLUE CROSS/BLUE SHIELD | Attending: Internal Medicine | Admitting: Dietician

## 2017-05-16 DIAGNOSIS — I10 Essential (primary) hypertension: Secondary | ICD-10-CM | POA: Diagnosis not present

## 2017-05-16 DIAGNOSIS — Z7689 Persons encountering health services in other specified circumstances: Secondary | ICD-10-CM | POA: Insufficient documentation

## 2017-05-16 DIAGNOSIS — Z713 Dietary counseling and surveillance: Secondary | ICD-10-CM | POA: Insufficient documentation

## 2017-05-16 DIAGNOSIS — E6609 Other obesity due to excess calories: Secondary | ICD-10-CM

## 2017-05-16 DIAGNOSIS — Z6831 Body mass index (BMI) 31.0-31.9, adult: Secondary | ICD-10-CM

## 2017-05-16 NOTE — Progress Notes (Signed)
  Medical Nutrition Therapy:  Appt start time: 3235 end time:  5732.   Assessment:  Primary concerns today: follow-up for obesity and hypertension.  History of cancer and nephrectomy in March 2018.  We reviewed patient goals and progress on the small, measurable changes to his eating habits.  He reports that he feels really good about his progress and would like to keep tracking his goals using the action tracker sheet.  Recommended he think about an incentive for himself as he reaches his goals each month or each quarter.  He liked that idea and states he will discuss some ideas with his wife.  We also reviewed his long-term weight loss goal and discussed self-talk, motivation, and self-discipline.  His confidence to meet his goals this next month was rated as a 3-4 on a scale of 1-5 with 5 being confident.  He feels like his current goals are still challenging him and wants to keep them the same for this next month.  We discussed ways to incorporate fiber rich beans into his eating pattern more often and also discussed planning ahead for his lunches.  His wife does the grocery shopping and will ask what he needs, so he is focusing on lower sodium options and keeping fish like tuna and sardines on hand.  Goal Setting: - Have three 16 oz glasses of water per day (48 oz total) - Limit eating out for lunch to 3 times per week - Choose healthier proteins like beans, nuts, seeds, and fish (2-3 servings of fish and beans each week) - avoid processed, fatty meats - Keep track of goals using the Action Steps Tracker  Learning Readiness:  Ready for small, attainable food habit changes  Change in progress for incorporating regular physical activity into his routine   DIETARY INTAKE:   24-hr recall:  B (10 AM): special K cereal with banana, 1/2 glass of OJ  Snk ( AM): usually none L (2 PM): [employer], television/film/video and salad, iced half and half tea Snk (5:30 PM): hummus and pretzels D (7:30-8 PM):  Hibachi tempura shrimp with rice and tempura veggies, water and red wine Snk ( PM): one 2" brownie before bed Beverages: 2 glasses of water per day - trying to increase to 3 per day before meals, half and half tea, 1-2 glasses of wine  Usual physical activity: walking dog 2x/day for 3 miles total per day, going to the gym every other day, doing elliptical and some light weight lifting, row machine and swimming  Progress Towards Goal(s):  In progress.  Barriers to learning/adherence to lifestyle change: none  Materials given: Action Steps tracking sheet  Demonstrated degree of understanding via:  Teach Back   Monitoring/Evaluation:  Dietary intake, exercise, goal tracking, and body weight in 4 week(s).

## 2017-06-16 ENCOUNTER — Encounter (HOSPITAL_COMMUNITY): Payer: Self-pay | Admitting: Emergency Medicine

## 2017-06-16 ENCOUNTER — Emergency Department (HOSPITAL_COMMUNITY): Payer: BLUE CROSS/BLUE SHIELD

## 2017-06-16 ENCOUNTER — Emergency Department (HOSPITAL_COMMUNITY)
Admission: EM | Admit: 2017-06-16 | Discharge: 2017-06-16 | Disposition: A | Discharge status: Home / Self Care | Payer: BLUE CROSS/BLUE SHIELD | Attending: Emergency Medicine | Admitting: Emergency Medicine

## 2017-06-16 ENCOUNTER — Other Ambulatory Visit: Payer: Self-pay

## 2017-06-16 DIAGNOSIS — Z79899 Other long term (current) drug therapy: Secondary | ICD-10-CM | POA: Diagnosis not present

## 2017-06-16 DIAGNOSIS — R001 Bradycardia, unspecified: Secondary | ICD-10-CM | POA: Diagnosis not present

## 2017-06-16 DIAGNOSIS — I10 Essential (primary) hypertension: Secondary | ICD-10-CM | POA: Diagnosis not present

## 2017-06-16 DIAGNOSIS — Z8673 Personal history of transient ischemic attack (TIA), and cerebral infarction without residual deficits: Secondary | ICD-10-CM | POA: Diagnosis not present

## 2017-06-16 DIAGNOSIS — Y939 Activity, unspecified: Secondary | ICD-10-CM | POA: Diagnosis not present

## 2017-06-16 DIAGNOSIS — Z85528 Personal history of other malignant neoplasm of kidney: Secondary | ICD-10-CM | POA: Insufficient documentation

## 2017-06-16 DIAGNOSIS — S022XXA Fracture of nasal bones, initial encounter for closed fracture: Secondary | ICD-10-CM | POA: Diagnosis not present

## 2017-06-16 DIAGNOSIS — M545 Low back pain, unspecified: Secondary | ICD-10-CM

## 2017-06-16 DIAGNOSIS — W0110XA Fall on same level from slipping, tripping and stumbling with subsequent striking against unspecified object, initial encounter: Secondary | ICD-10-CM | POA: Insufficient documentation

## 2017-06-16 DIAGNOSIS — Y999 Unspecified external cause status: Secondary | ICD-10-CM | POA: Diagnosis not present

## 2017-06-16 DIAGNOSIS — M6281 Muscle weakness (generalized): Secondary | ICD-10-CM | POA: Insufficient documentation

## 2017-06-16 DIAGNOSIS — Y92002 Bathroom of unspecified non-institutional (private) residence single-family (private) house as the place of occurrence of the external cause: Secondary | ICD-10-CM | POA: Insufficient documentation

## 2017-06-16 DIAGNOSIS — S0181XA Laceration without foreign body of other part of head, initial encounter: Secondary | ICD-10-CM | POA: Diagnosis not present

## 2017-06-16 DIAGNOSIS — S92511A Displaced fracture of proximal phalanx of right lesser toe(s), initial encounter for closed fracture: Secondary | ICD-10-CM | POA: Insufficient documentation

## 2017-06-16 DIAGNOSIS — W19XXXA Unspecified fall, initial encounter: Secondary | ICD-10-CM | POA: Diagnosis not present

## 2017-06-16 DIAGNOSIS — I951 Orthostatic hypotension: Secondary | ICD-10-CM | POA: Insufficient documentation

## 2017-06-16 DIAGNOSIS — S0990XA Unspecified injury of head, initial encounter: Secondary | ICD-10-CM | POA: Diagnosis not present

## 2017-06-16 DIAGNOSIS — S99911A Unspecified injury of right ankle, initial encounter: Secondary | ICD-10-CM | POA: Diagnosis not present

## 2017-06-16 DIAGNOSIS — I252 Old myocardial infarction: Secondary | ICD-10-CM | POA: Diagnosis not present

## 2017-06-16 DIAGNOSIS — M25571 Pain in right ankle and joints of right foot: Secondary | ICD-10-CM | POA: Diagnosis not present

## 2017-06-16 LAB — CBC WITH DIFFERENTIAL/PLATELET
Abs Immature Granulocytes: 0 10*3/uL (ref 0.0–0.1)
BASOS PCT: 0 %
Basophils Absolute: 0 10*3/uL (ref 0.0–0.1)
EOS PCT: 2 %
Eosinophils Absolute: 0.2 10*3/uL (ref 0.0–0.7)
HCT: 40.3 % (ref 39.0–52.0)
HEMOGLOBIN: 12.9 g/dL — AB (ref 13.0–17.0)
Immature Granulocytes: 0 %
Lymphocytes Relative: 25 %
Lymphs Abs: 2.4 10*3/uL (ref 0.7–4.0)
MCH: 29.8 pg (ref 26.0–34.0)
MCHC: 32 g/dL (ref 30.0–36.0)
MCV: 93.1 fL (ref 78.0–100.0)
MONO ABS: 1.1 10*3/uL — AB (ref 0.1–1.0)
Monocytes Relative: 11 %
Neutro Abs: 5.9 10*3/uL (ref 1.7–7.7)
Neutrophils Relative %: 62 %
PLATELETS: 238 10*3/uL (ref 150–400)
RBC: 4.33 MIL/uL (ref 4.22–5.81)
RDW: 12.4 % (ref 11.5–15.5)
WBC: 9.5 10*3/uL (ref 4.0–10.5)

## 2017-06-16 LAB — BASIC METABOLIC PANEL
Anion gap: 6 (ref 5–15)
BUN: 26 mg/dL — AB (ref 6–20)
CHLORIDE: 107 mmol/L (ref 101–111)
CO2: 24 mmol/L (ref 22–32)
CREATININE: 1.75 mg/dL — AB (ref 0.61–1.24)
Calcium: 9 mg/dL (ref 8.9–10.3)
GFR calc Af Amer: 47 mL/min — ABNORMAL LOW (ref >60)
GFR calc non Af Amer: 41 mL/min — ABNORMAL LOW (ref >60)
GLUCOSE: 105 mg/dL — AB (ref 65–99)
Potassium: 5.1 mmol/L (ref 3.5–5.1)
SODIUM: 137 mmol/L (ref 135–145)

## 2017-06-16 LAB — URINALYSIS, ROUTINE W REFLEX MICROSCOPIC
Bacteria, UA: NONE SEEN
Bilirubin Urine: NEGATIVE
GLUCOSE, UA: NEGATIVE mg/dL
Ketones, ur: NEGATIVE mg/dL
Leukocytes, UA: NEGATIVE
Nitrite: NEGATIVE
PROTEIN: NEGATIVE mg/dL
Specific Gravity, Urine: 1.026 (ref 1.005–1.030)
pH: 5 (ref 5.0–8.0)

## 2017-06-16 MED ORDER — SODIUM CHLORIDE 0.9 % IV BOLUS
1000.0000 mL | Freq: Once | INTRAVENOUS | Status: AC
  Administered 2017-06-16: 1000 mL via INTRAVENOUS

## 2017-06-16 MED ORDER — SODIUM CHLORIDE 0.9 % IV BOLUS
500.0000 mL | Freq: Once | INTRAVENOUS | Status: AC
  Administered 2017-06-16: 500 mL via INTRAVENOUS

## 2017-06-16 NOTE — ED Notes (Signed)
ED Provider at bedside. 

## 2017-06-16 NOTE — ED Provider Notes (Signed)
[facility] EMERGENCY DEPARTMENT Provider Note   CSN: [id number] Arrival date & time: 06/16/17  0158     History   Chief Complaint Chief Complaint  Patient presents with  . Fall    HPI Nazaiah Navarrete is a 62 y.o. male.  Patient brought to the emergency department by EMS from home after fall.  Patient reports that he had gotten up out of bed to go to the bathroom just before the fall.  He was in the bathroom when he suddenly felt weak and fell to the ground.  He does not think he lost consciousness.  Patient complaining of facial pain, headache, low back pain, right foot and ankle pain after the fall.  He does, however, report that the back pain has been ongoing for several days, before the fall.  He has a history of recurrent back pain similar to this.  He denies any recent illness.  He felt well all day today.  No fever, nausea, vomiting, diarrhea.     Past Medical History:  Diagnosis Date  . Bacteremia 110/2017  . Cancer ([place])    renal mass at preop  . CVA (cerebral infarction)    hemmorhagic  CVA  . Depression   . Dyslipidemia   . Endocarditis    MV [place]  . History of kidney stones   . Hypertension   . Mitral regurgitation   . Myocardial infarction ([place])   . Stroke [facility])     Patient Active Problem List   Diagnosis Date Noted  . Right renal mass 02/22/2016  . Secondary hypertension   . Debility   . History of CVA with residual deficit   . History of endocarditis   . 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 ETOH abuse   . Hyponatremia   . Dysphagia   . Respiratory failure ([place])   . Elevated troponin   . MVP (mitral valve prolapse)   . Mitral valve insufficiency   . Sepsis ([place]) 10/29/2015  . Dyslipidemia 10/26/2010  . Mitral valve disorders(424.0) 08/25/2008  . DVT 08/25/2008  . SEIZURE DISORDER 08/25/2008  . CHEST PAIN 08/25/2008  . DYSPHAGIA UNSPECIFIED 08/25/2008  . MYOCARDIAL INFARCTION, ANTERIOR WALL, INITIAL EPISODE  06/18/2008  . 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 Other and unspecified hyperlipidemia 06/11/2008  . Essential hypertension 06/11/2008  . ENDOCARDITIS, BACTERIAL, ACUTE 06/11/2008  . FOLLICULITIS 37/85/8850  . NEPHROLITHIASIS, HX OF 06/11/2008    Past Surgical History:  Procedure Laterality Date  . NO PAST SURGERIES    . ROBOT ASSISTED LAPAROSCOPIC NEPHRECTOMY Right 02/22/2016   Procedure: XI ROBOTIC ASSISTED LAPAROSCOPIC NEPHRECTOMY;  Surgeon: Alexis Frock, MD;  Location: [facility]S;  Service: Urology;  Laterality: Right;  . TONSILLECTOMY          Home Medications    Prior to Admission medications   Medication Sig Start Date End Date Taking? Authorizing Provider  amLODipine (NORVASC) 2.5 MG tablet Take 2.5 mg by mouth daily. 05/19/17  Yes [provider]  buPROPion (WELLBUTRIN XL) 150 MG 24 hr tablet Take 150 mg by mouth daily.   Yes [provider]  Cholecalciferol (VITAMIN D3) 2000 units TABS Take 2,000 Units by mouth daily.   Yes [provider]  diphenhydramine-acetaminophen (TYLENOL PM EXTRA STRENGTH) 25-500 MG TABS tablet Take 2 tablets by mouth at bedtime.   Yes [provider]  finasteride (PROSCAR) 5 MG tablet Take 1 tablet (5 mg total) by mouth daily. 11/14/15  Yes Love, Olin Hauser  S, PA-C  losartan (COZAAR) 50 MG tablet Take 50 mg by mouth daily. 05/19/17  Yes [provider]  Melatonin 5 MG TABS Take 5 mg by mouth at bedtime.   Yes [provider]  metoprolol tartrate (LOPRESSOR) 25 MG tablet Take 1 tablet (25 mg total) by mouth 2 (two) times daily. 11/14/15  Yes Love, Ivan Anchors, PA-C  Omega-3 Fatty Acids (FISH OIL PO) Take 1 capsule by mouth daily.    Yes [provider]  rosuvastatin (CRESTOR) 10 MG tablet TAKE 1 TABLET ONCE DAILY. 02/08/17  Yes Fay Records, MD    Family History Family History  Problem Relation Age of Onset  . Heart disease Mother        mom RF as a child (died of heart  problem in  78 )    Social History Social History   Tobacco Use  . Smoking status: Never Smoker  . Smokeless tobacco: Never Used  Substance Use Topics  . Alcohol use: Yes    Alcohol/week: 4.2 oz    Types: 7 Glasses of wine per week  . Drug use: Yes    Types: Marijuana     Allergies   Patient has no known allergies.   Review of Systems Review of Systems  Respiratory: Negative for shortness of breath.   Cardiovascular: Negative for chest pain.  Musculoskeletal: Positive for arthralgias and back pain.  Neurological: Positive for headaches.  All other systems reviewed and are negative.    Physical Exam Updated Vital Signs BP (!) 116/57 (BP Location: Left Arm)   Pulse (!) 54   Temp 98 F (36.7 C) (Oral)   Resp 16   SpO2 97%   Physical Exam  Constitutional: He is oriented to person, place, and time. He appears well-developed and well-nourished. No distress.  HENT:  Head: Normocephalic and atraumatic.  Right Ear: Hearing normal.  Left Ear: Hearing normal.  Nose: Nose lacerations present. No nasal deformity or nasal septal hematoma.  Mouth/Throat: Oropharynx is clear and moist and mucous membranes are normal.  Eyes: Pupils are equal, round, and reactive to light. Conjunctivae and EOM are normal.  Neck: Normal range of motion. Neck supple.  Cardiovascular: Regular rhythm, S1 normal and S2 normal. Exam reveals no gallop and no friction rub.  No murmur heard. Pulmonary/Chest: Effort normal and breath sounds normal. No respiratory distress. He exhibits no tenderness.  Abdominal: Soft. Normal appearance and bowel sounds are normal. There is no hepatosplenomegaly. There is no tenderness. There is no rebound, no guarding, no tenderness at McBurney's point and negative Murphy's sign. No hernia.  Musculoskeletal:       Right ankle: He exhibits normal range of motion, no swelling and no deformity. Tenderness.       Lumbar back: He exhibits tenderness.       Back:       Right  foot: There is tenderness. There is normal range of motion, no swelling and no deformity.  Neurological: He is alert and oriented to person, place, and time. He has normal strength. No cranial nerve deficit or sensory deficit. Coordination normal. GCS eye subscore is 4. GCS verbal subscore is 5. GCS motor subscore is 6.  Skin: Skin is warm, dry and intact. No rash noted. No cyanosis.  Psychiatric: He has a normal mood and affect. His speech is normal and behavior is normal. Thought content normal.  Nursing note and vitals reviewed.    ED Treatments / Results  Labs (all labs ordered are  listed, but only abnormal results are displayed) Labs Reviewed  CBC WITH DIFFERENTIAL/PLATELET - Abnormal; Notable for the following components:      Result Value   Hemoglobin 12.9 (*)    Monocytes Absolute 1.1 (*)    All other components within normal limits  BASIC METABOLIC PANEL - Abnormal; Notable for the following components:   Glucose, Bld 105 (*)    BUN 26 (*)    Creatinine, Ser 1.75 (*)    GFR calc non Af Amer 41 (*)    GFR calc Af Amer 47 (*)    All other components within normal limits  URINALYSIS, ROUTINE W REFLEX MICROSCOPIC - Abnormal; Notable for the following components:   Hgb urine dipstick SMALL (*)    All other components within normal limits    EKG None  Radiology Dg Lumbar Spine Complete  Result Date: 06/16/2017 CLINICAL DATA:  Fall with lumbosacral back pain. EXAM: LUMBAR SPINE - COMPLETE 4+ VIEW COMPARISON:  None. FINDINGS: The alignment is maintained. Vertebral body heights are normal. There is no listhesis. The posterior elements are intact. Mild disc space narrowing and endplate spurring at E5-I7 and L4-L5. No fracture. Sacroiliac joints are symmetric and normal. Surgical clips to the right of L1 from prior nephrectomy. IMPRESSION: No acute fracture of the lumbar spine. Mild for age degenerative change. Electronically Signed   By: Jeb Levering M.D.   On: 06/16/2017 03:03     Dg Ankle Complete Right  Result Date: 06/16/2017 CLINICAL DATA:  Right foot and ankle pain after fall. EXAM: RIGHT ANKLE - COMPLETE 3+ VIEW COMPARISON:  None. FINDINGS: There is no evidence of fracture, dislocation, or joint effusion. Chronic periosteal thickening in the region of the interosseous membrane. There is no evidence of arthropathy or other focal bone abnormality. Soft tissues are unremarkable. IMPRESSION: No fracture or dislocation of the right ankle. Electronically Signed   By: Jeb Levering M.D.   On: 06/16/2017 03:06   Ct Head Wo Contrast  Result Date: 06/16/2017 CLINICAL DATA:  Fall with facial injury.  Laceration to nose. EXAM: CT HEAD WITHOUT CONTRAST CT MAXILLOFACIAL WITHOUT CONTRAST TECHNIQUE: Multidetector CT imaging of the head and maxillofacial structures were performed using the standard protocol without intravenous contrast. Multiplanar CT image reconstructions of the maxillofacial structures were also generated. COMPARISON:  Head CT 10/29/2015 FINDINGS: CT HEAD FINDINGS Brain: Stable rain volume from prior exam. Remote lacunar infarct in left thalamus and left temporal lobe. No intracranial hemorrhage, mass effect, or midline shift. No hydrocephalus. The basilar cisterns are patent. No evidence of territorial infarct or acute ischemia. No extra-axial or intracranial fluid collection. Vascular: No hyperdense vessel or unexpected calcification. Skull: No fracture or focal lesion. Other: None. CT MAXILLOFACIAL FINDINGS Osseous: Minimally displaced right nasal bone fracture. Nasal septum remains midline. Zygomatic arches and mandibles are intact. Temporomandibular joints are congruent. Orbits: Both orbits and globes are intact.  No orbital fracture. Sinuses: No sinus fracture or fluid level. Small mucous retention cysts. Soft tissues: Negative. IMPRESSION: 1.  No acute intracranial abnormality.  No skull fracture. 2. Remote lacunar infarct in the left thalamus and left temporal  lobe, unchanged. 3. Minimally displaced right nasal bone fracture. Facial bones are otherwise intact. Electronically Signed   By: Jeb Levering M.D.   On: 06/16/2017 02:43   Dg Foot Complete Right  Result Date: 06/16/2017 CLINICAL DATA:  Right foot and ankle pain after fall. EXAM: RIGHT FOOT COMPLETE - 3+ VIEW COMPARISON:  None. FINDINGS: Mildly displaced acute fracture base  of the fifth toe proximal phalanx at the metatarsal phalangeal joint articular surface. No additional acute fracture. Pes planus. Alignment is otherwise maintained. No focal soft tissue abnormality. IMPRESSION: Fifth toe proximal phalanx fracture at the metatarsal phalangeal joint articular surface. Electronically Signed   By: Jeb Levering M.D.   On: 06/16/2017 03:05   Ct Maxillofacial Wo Contrast  Result Date: 06/16/2017 CLINICAL DATA:  Fall with facial injury.  Laceration to nose. EXAM: CT HEAD WITHOUT CONTRAST CT MAXILLOFACIAL WITHOUT CONTRAST TECHNIQUE: Multidetector CT imaging of the head and maxillofacial structures were performed using the standard protocol without intravenous contrast. Multiplanar CT image reconstructions of the maxillofacial structures were also generated. COMPARISON:  Head CT 10/29/2015 FINDINGS: CT HEAD FINDINGS Brain: Stable rain volume from prior exam. Remote lacunar infarct in left thalamus and left temporal lobe. No intracranial hemorrhage, mass effect, or midline shift. No hydrocephalus. The basilar cisterns are patent. No evidence of territorial infarct or acute ischemia. No extra-axial or intracranial fluid collection. Vascular: No hyperdense vessel or unexpected calcification. Skull: No fracture or focal lesion. Other: None. CT MAXILLOFACIAL FINDINGS Osseous: Minimally displaced right nasal bone fracture. Nasal septum remains midline. Zygomatic arches and mandibles are intact. Temporomandibular joints are congruent. Orbits: Both orbits and globes are intact.  No orbital fracture. Sinuses: No  sinus fracture or fluid level. Small mucous retention cysts. Soft tissues: Negative. IMPRESSION: 1.  No acute intracranial abnormality.  No skull fracture. 2. Remote lacunar infarct in the left thalamus and left temporal lobe, unchanged. 3. Minimally displaced right nasal bone fracture. Facial bones are otherwise intact. Electronically Signed   By: Jeb Levering M.D.   On: 06/16/2017 02:43    Procedures Procedures (including critical care time)  Medications Ordered in ED Medications  sodium chloride 0.9 % bolus 1,000 mL (1,000 mLs Intravenous New Bag/Given 06/16/17 0612)  sodium chloride 0.9 % bolus 500 mL (0 mLs Intravenous Stopped 06/16/17 0307)     Initial Impression / Assessment and Plan / ED Course  I have reviewed the triage vital signs and the nursing notes.  Pertinent labs & imaging results that were available during my care of the patient were reviewed by me and considered in my medical decision making (see chart for details).     Patient presents to the emergency department for evaluation after a fall.  Patient was in the bathroom when he fell.  He is not sure what caused the fall, reports that he suddenly felt very weak and "collapsed".  There was no loss of consciousness.  No chest pain or difficulty breathing.  Patient's vital signs were unremarkable at arrival, however, he was found to be mildly orthostatic here in the ER.  He has been treated with IV fluids and this is improved.  Patient was found to have a slightly displaced nasal fracture, no septal hematoma.  He also has a fracture of his right small toe.  No specific treatment will be necessary for these fractures.  He was complaining of low back pain but that has been ongoing for days and his x-ray is unremarkable.  Patient noted to have a laceration across his nose.  This appears to be an avulsion of skin.  After cleaning of the area, it does not appear amenable to sutures.  Wound care discussed with patient.  Final  Clinical Impressions(s) / ED Diagnoses   Final diagnoses:  Closed fracture of nasal bone, initial encounter  Closed displaced fracture of proximal phalanx of lesser toe of right foot, initial encounter  Facial laceration, initial encounter  Orthostatic hypotension  Acute bilateral low back pain without sciatica    ED Discharge Orders    None       Orpah Greek, MD 06/16/17 [phone]

## 2017-06-16 NOTE — ED Triage Notes (Signed)
Pt BIB [facility] from home after a fall. Pt unsure how he fell, denies LOC. Laceration to the bridge of his nose, pain in the right ankle, and lower back pain that started Thursday. A&O x 4.

## 2017-06-17 DIAGNOSIS — M79671 Pain in right foot: Secondary | ICD-10-CM | POA: Diagnosis not present

## 2017-06-17 DIAGNOSIS — L602 Onychogryphosis: Secondary | ICD-10-CM | POA: Diagnosis not present

## 2017-06-17 DIAGNOSIS — S92911A Unspecified fracture of right toe(s), initial encounter for closed fracture: Secondary | ICD-10-CM | POA: Diagnosis not present

## 2017-06-20 ENCOUNTER — Encounter: Payer: Self-pay | Admitting: Dietician

## 2017-06-20 ENCOUNTER — Encounter: Payer: BLUE CROSS/BLUE SHIELD | Attending: Internal Medicine | Admitting: Dietician

## 2017-06-20 DIAGNOSIS — Z6831 Body mass index (BMI) 31.0-31.9, adult: Secondary | ICD-10-CM

## 2017-06-20 DIAGNOSIS — I10 Essential (primary) hypertension: Secondary | ICD-10-CM | POA: Insufficient documentation

## 2017-06-20 DIAGNOSIS — Z713 Dietary counseling and surveillance: Secondary | ICD-10-CM | POA: Diagnosis not present

## 2017-06-20 DIAGNOSIS — Z7689 Persons encountering health services in other specified circumstances: Secondary | ICD-10-CM | POA: Diagnosis not present

## 2017-06-20 DIAGNOSIS — E669 Obesity, unspecified: Secondary | ICD-10-CM

## 2017-06-20 NOTE — Progress Notes (Signed)
  Medical Nutrition Therapy:  Appt start time: 6440 end time:  3474.   Assessment:  Primary concerns today: follow-up for obesity and hypertension.  History of cancer and nephrectomy in March 2018.  We reviewed patient goals and progress on the small, measurable changes to his eating habits.  Patient had a fall 3 days ago that resulted in fractured nose and toe.  He has modified his activity but still going to the gym every other day and walking his dog.  He feels like his current goals are still challenging him and wants to keep them the same for this next month.  We discussed mindful eating.  He reports being satisfied with smaller portions.  He will be on vacation next week and plans to continue his activity routine - they have access to a gym at the rental property.  He states that his motivation level has been higher and that he is confident he can continue to make progress towards his weight loss goals stating that he "wants to succeed."  Discussed more recommended reading options for him and answered his nutrition questions.  Brainstormed healthy restaurants in the area and healthy cereal brands.  Recommended [employer] and [employer], and choosing cereals with at least 3 grams of Fiber and <10 grams of sugar.  Goal Setting: - Have three 16 oz glasses of water per day (48 oz total) - Choose healthier proteins like beans, nuts, seeds, and fish (2-3 servings of fish and beans each week) - avoid processed, fatty meats - Practice mindful eating techniques - Continue regular physical activity   Learning Readiness:  Ready for small, attainable food habit changes  Change in progress for incorporating regular physical activity into his routine   DIETARY INTAKE:   24-hr recall:  B (10 AM): special K cereal with banana, 1/2 glass of OJ  Snk ( AM): usually none L (2 PM): [employer], television/film/video and salad, iced half and half tea or skips Snk ( PM): usually none D (7:30-8 PM): Hibachi tempura  shrimp with rice and tempura veggies, water and red wine Snk ( PM): one 2" brownie before bed Beverages: 2 glasses of water per day - trying to increase to 3 per day before meals, half and half tea, 1-2 glasses of wine  Usual physical activity: walking dog for 1 mile total per day, going to the gym every other day, doing elliptical and some light weight lifting, row machine and swimming  Progress Towards Goal(s):  In progress.  Barriers to learning/adherence to lifestyle change: none  Demonstrated degree of understanding via:  Teach Back   Recommended Reading: Intuitive Eating, The Happiness Diet  Monitoring/Evaluation:  Dietary intake, exercise, goal tracking, and body weight in 4 week(s).

## 2017-07-08 DIAGNOSIS — L602 Onychogryphosis: Secondary | ICD-10-CM | POA: Diagnosis not present

## 2017-07-08 DIAGNOSIS — S92911D Unspecified fracture of right toe(s), subsequent encounter for fracture with routine healing: Secondary | ICD-10-CM | POA: Diagnosis not present

## 2017-07-08 DIAGNOSIS — M79671 Pain in right foot: Secondary | ICD-10-CM | POA: Diagnosis not present

## 2017-07-10 DIAGNOSIS — N183 Chronic kidney disease, stage 3 (moderate): Secondary | ICD-10-CM | POA: Diagnosis not present

## 2017-07-10 DIAGNOSIS — I129 Hypertensive chronic kidney disease with stage 1 through stage 4 chronic kidney disease, or unspecified chronic kidney disease: Secondary | ICD-10-CM | POA: Diagnosis not present

## 2017-07-10 DIAGNOSIS — Z905 Acquired absence of kidney: Secondary | ICD-10-CM | POA: Diagnosis not present

## 2017-07-10 DIAGNOSIS — N189 Chronic kidney disease, unspecified: Secondary | ICD-10-CM | POA: Diagnosis not present

## 2017-07-10 DIAGNOSIS — D631 Anemia in chronic kidney disease: Secondary | ICD-10-CM | POA: Diagnosis not present

## 2017-07-18 ENCOUNTER — Encounter: Payer: BLUE CROSS/BLUE SHIELD | Attending: Internal Medicine | Admitting: Dietician

## 2017-07-18 ENCOUNTER — Encounter: Payer: Self-pay | Admitting: Dietician

## 2017-07-18 DIAGNOSIS — Z713 Dietary counseling and surveillance: Secondary | ICD-10-CM | POA: Insufficient documentation

## 2017-07-18 DIAGNOSIS — Z7689 Persons encountering health services in other specified circumstances: Secondary | ICD-10-CM | POA: Insufficient documentation

## 2017-07-18 DIAGNOSIS — E6609 Other obesity due to excess calories: Secondary | ICD-10-CM

## 2017-07-18 DIAGNOSIS — Z6831 Body mass index (BMI) 31.0-31.9, adult: Secondary | ICD-10-CM

## 2017-07-18 DIAGNOSIS — I10 Essential (primary) hypertension: Secondary | ICD-10-CM | POA: Diagnosis not present

## 2017-07-18 NOTE — Progress Notes (Signed)
  Medical Nutrition Therapy:  Appt start time: 0881 end time:  1031.   Assessment:  Primary concerns today: follow-up for obesity and hypertension.  History of cancer and nephrectomy in March 2018.  We reviewed patient goals and progress on the small, measurable changes to his eating habits.  He has lost approximately 4.5 lbs since April.  He is not checking his blood pressure at home as regularly as he was but recent readings at the doctor have been good.  He is doing very well with being consistent with his activity/exercise routine.  He is confident he will continue.  Still struggling with his water goal.  Getting 2 glasses per day - 1 at breakfast and 1 in the afternoon.  Recommended he find a morning task that is habitual for him and have his 3rd glass of water at that time. He reports still being satisfied with smaller portions and will often stop eating before he has cleared his plate.  Continues to make healthy snack choices and is staying away from processed meats and red meat - choosing more chicken and fish.  Updated his dietary recall and discussed his successes and challenges from the last month.  Goals: - Have three 16 oz glasses of water per day (48 oz total) - Choose healthier proteins like beans, nuts, seeds, and fish (2-3 servings of fish and beans each week) - avoid processed, fatty meats - Practice mindful eating techniques - Continue regular physical activity  Learning Readiness:  Ready for small, attainable food habit changes  Change in progress for incorporating regular physical activity into his routine   DIETARY INTAKE:   24-hr recall:  B (10 AM): special K or other "healthy" cereal with banana, 16 oz water Snk ( AM): usually none L (2 PM): [employer], television/film/video and salad, iced half and half tea or skips Snk ( PM): pistachios, dried apricots D (7:30-8 PM): baked chicken with veggies, water and red wine Snk ( PM): one 2" brownie before bed, cherries Beverages:  2 glasses of water per day - trying to increase to 3 per day before meals, half and half tea, 1-2 glasses of wine  Usual physical activity: walking dog for 1 mile total per day, going to the gym every other day, doing elliptical and some light weight lifting, row machine and swimming  Progress Towards Goal(s):  In progress.  Barriers to learning/adherence to lifestyle change: none  Demonstrated degree of understanding via:  Teach Back   Monitoring/Evaluation:  Dietary intake, exercise, goal tracking, and body weight in 6 week(s).

## 2017-08-15 ENCOUNTER — Encounter: Payer: Self-pay | Admitting: Internal Medicine

## 2017-08-29 ENCOUNTER — Encounter: Payer: Self-pay | Admitting: Dietician

## 2017-08-29 ENCOUNTER — Telehealth: Payer: Self-pay | Admitting: Internal Medicine

## 2017-08-29 ENCOUNTER — Encounter: Payer: BLUE CROSS/BLUE SHIELD | Attending: Internal Medicine | Admitting: Dietician

## 2017-08-29 DIAGNOSIS — Z713 Dietary counseling and surveillance: Secondary | ICD-10-CM | POA: Insufficient documentation

## 2017-08-29 DIAGNOSIS — Z7689 Persons encountering health services in other specified circumstances: Secondary | ICD-10-CM | POA: Diagnosis not present

## 2017-08-29 DIAGNOSIS — E669 Obesity, unspecified: Secondary | ICD-10-CM

## 2017-08-29 DIAGNOSIS — Z6831 Body mass index (BMI) 31.0-31.9, adult: Secondary | ICD-10-CM

## 2017-08-29 DIAGNOSIS — I10 Essential (primary) hypertension: Secondary | ICD-10-CM

## 2017-08-29 NOTE — Telephone Encounter (Signed)
New Message       Nutrition and diabetes [place] is needing a referral for the patient.

## 2017-08-29 NOTE — Progress Notes (Signed)
  Medical Nutrition Therapy:  Appt start time: 1500 end time:  4098.   Assessment:  Primary concerns today: follow-up for obesity and hypertension.  History of cancer and nephrectomy in March 2018.  We reviewed patient goals and progress on the small, measurable changes to his eating habits.  Started using a goal tracking form today to help him grade himself on his progress.  He is now checking his BP 1x/week at home, states that it has been normal over last month at home and at doctor's visits.  States he is having no issues remembering to take medication.  He has maintained his weight since our last visit.  Discussed his successes and challenges from the last month.  Goals: - Have three 16 oz glasses of water per day (48 oz total) - Choose healthier proteins like beans, nuts, seeds, and fish (2-3 servings of fish and beans each week) - avoid processed, fatty meats - Practice mindful eating techniques - Continue regular physical activity - Reduce sodium intake (eat out less and limit packaged/processed foods)  Materials given: - Personalized Goal Tracking Sheet  Learning Readiness:  Ready for small, attainable food habit changes  Change in progress for incorporating regular physical activity into his routine   DIETARY INTAKE:   24-hr recall:  B (10 AM): special K or other "healthy" cereal with banana, 16 oz water  Snk ( AM): usually none L (2 PM): [employer], television/film/video and salad, iced half and half tea or skips Snk ( PM): pistachios D (7:30-8 PM): baked chicken with veggies, water and red wine Snk ( PM): one 2" brownie before bed, cherries Beverages: 2 glasses of water per day - trying to increase to 3 per day before meals, half and half tea, 1-2 glasses of wine  Usual physical activity: walking dog for 2-3 miles total per day, going to the gym every other day, doing elliptical and some light weight lifting, row machine and swimming  Progress Towards Goal(s):  In  progress.  Barriers to learning/adherence to lifestyle change: none  Demonstrated degree of understanding via:  Teach Back   Monitoring/Evaluation:  Dietary intake, exercise, goal tracking, and body weight in 8 week(s).

## 2017-08-29 NOTE — Telephone Encounter (Signed)
[name] with [facility] nutrition counseling was wondering if she could get an updated referral for Nutrition and Diabetes with [facility]. Advised her that I will talk with [name]. [phone]

## 2017-08-30 NOTE — Telephone Encounter (Signed)
Yes, get updated referral to nutrititon

## 2017-08-30 NOTE — Telephone Encounter (Signed)
Per [name] updated order put in for Nutritional Counseling.

## 2017-09-09 ENCOUNTER — Ambulatory Visit: Payer: BLUE CROSS/BLUE SHIELD | Admitting: Internal Medicine

## 2017-09-10 ENCOUNTER — Ambulatory Visit (INDEPENDENT_AMBULATORY_CARE_PROVIDER_SITE_OTHER): Payer: BLUE CROSS/BLUE SHIELD | Admitting: Internal Medicine

## 2017-09-10 ENCOUNTER — Encounter: Payer: Self-pay | Admitting: Internal Medicine

## 2017-09-10 VITALS — BP 114/66 | HR 57 | Ht 68.5 in | Wt 206.0 lb

## 2017-09-10 DIAGNOSIS — I10 Essential (primary) hypertension: Secondary | ICD-10-CM | POA: Diagnosis not present

## 2017-09-10 DIAGNOSIS — I340 Nonrheumatic mitral (valve) insufficiency: Secondary | ICD-10-CM

## 2017-09-10 DIAGNOSIS — Z8679 Personal history of other diseases of the circulatory system: Secondary | ICD-10-CM

## 2017-09-10 DIAGNOSIS — R42 Dizziness and giddiness: Secondary | ICD-10-CM

## 2017-09-10 MED ORDER — METOPROLOL TARTRATE 25 MG PO TABS: ORAL_TABLET | ORAL | 0 refills | Status: DC

## 2017-09-10 NOTE — Patient Instructions (Addendum)
Your physician has recommended you make the following change in your medication: -- Metoprolol--decrease to 1 tablet daily for 7 days then                    Decrease to 1/2 tablet daily for 7 days then STOP  Your physician has requested that you have an echocardiogram. Echocardiography is a painless test that uses sound waves to create images of your heart. It provides your doctor with information about the size and shape of your heart and how well your heart's chambers and valves are working. This procedure takes approximately one hour. There are no restrictions for this procedure.  Your physician recommends that you schedule a follow-up appointment in: December with [name].  See below.

## 2017-09-10 NOTE — Progress Notes (Signed)
Cardiology Office Note   Date:  09/11/2017   ID:  [name], DOB 06/02/1955, MRN [id number]  PCP:  Josetta Huddle, MD  Cardiologist:   Dorris Carnes, MD    F/U of MV dz     History of Present Illness: [name] is a 62 y.o. 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 with resultant CVA then bleed  Last echo was in 2017     TEE was also  done showing thickened MV but no vegetation  Mild to mod MR  Prolapse noted   The patient was admitted for sepsis   He was incidentally found to have a rena lmass  Found to have renal cell CA  Underwent nephrectomy in Feb 2018  I saw the pt in 2018    Since then he was seen in ED on 06/16/17   He got up to go to bathroom  Urinated   On getting up dizzy   Went to floor  Broke nose   He denies syncope    Went to ED   Sent home  He has had one other dizzy spell   No syncope   No palpitatoins     Pt says his breathing is OK    Denies Cp     Losing a little wt   Admits to not following diet   Has a brownie every night    Outpatient Medications Prior to Visit  Medication Sig Dispense Refill  . amLODipine (NORVASC) 2.5 MG tablet Take 2.5 mg by mouth daily.  4  . buPROPion (WELLBUTRIN XL) 150 MG 24 hr tablet Take 150 mg by mouth daily.    . Cholecalciferol (VITAMIN D3) 2000 units TABS Take 2,000 Units by mouth daily.    . finasteride (PROSCAR) 5 MG tablet Take 1 tablet (5 mg total) by mouth daily. 30 tablet 0  . losartan (COZAAR) 50 MG tablet Take 50 mg by mouth daily.  4  . Melatonin 5 MG TABS Take 5 mg by mouth at bedtime.    . Omega-3 Fatty Acids (FISH OIL PO) Take 1 capsule by mouth daily.     . rosuvastatin (CRESTOR) 10 MG tablet TAKE 1 TABLET ONCE DAILY. 90 tablet 2  . metoprolol tartrate (LOPRESSOR) 25 MG tablet Take 1 tablet (25 mg total) by mouth 2 (two) times daily. 60 tablet 0  . diphenhydramine-acetaminophen (TYLENOL PM EXTRA STRENGTH) 25-500 MG TABS tablet Take 2 tablets by mouth at  bedtime.     No facility-administered medications prior to visit.      Allergies:   Patient has no known allergies.   Past Medical History:  Diagnosis Date  . Bacteremia 110/2017  . Cancer ([place])    renal mass at preop  . CVA (cerebral infarction)    hemmorhagic  CVA  . Depression   . Dyslipidemia   . Endocarditis    MV Vegitation  . History of kidney stones   . Hypertension   . Mitral regurgitation   . Myocardial infarction ([place])   . Stroke [facility])     Past Surgical History:  Procedure Laterality Date  . NO PAST SURGERIES    . ROBOT ASSISTED LAPAROSCOPIC NEPHRECTOMY Right 02/22/2016   Procedure: XI ROBOTIC ASSISTED LAPAROSCOPIC NEPHRECTOMY;  Surgeon: Alexis Frock, MD;  Location: [facility];  Service: Urology;  Laterality: Right;  . TONSILLECTOMY       Social History:  The patient  reports that he  has never smoked. He has never used smokeless tobacco. He reports that he drinks about 7.0 standard drinks of alcohol per week. He reports that he has current or past drug history. Drug: Marijuana.   Family History:  The patient's family history includes Heart disease in his mother.    ROS:  Please see the history of present illness. All other systems are reviewed and  Negative to the above problem except as noted.    PHYSICAL EXAM: VS:  BP 114/66   Pulse (!) 57   Ht 5' 8.5" (1.74 m)   Wt 206 lb (93.4 kg)   SpO2 96%   BMI 30.87 kg/m   GEN: Obese 62 yo in no acute distress  HEENT: normal  Neck: JVP normal  , carotid bruits, or masses Cardiac: RRR; NO murmur, rubs, or gallops,no edema  Respiratory:  clear to auscultation bilaterally, normal work of breathing GI: soft, nontender, nondistended, + BS  No hepatomegaly  MS: no deformity Moving all extremities   Skin: warm and dry, no rash Neuro:  Strength and sensation are intact Psych: euthymic mood, full affect   EKG:  EKG is not ordered today.      Lipid Panel    Component Value Date/Time   CHOL 241 (H) 01/19/2010  0904   TRIG 286 (H) 10/31/2015 0111   HDL 56.10 01/19/2010 0904   CHOLHDL 4 01/19/2010 0904   VLDL 22.4 01/19/2010 0904   LDLCALC 94 01/13/2009 0842   LDLDIRECT 164.1 01/19/2010 0904      Wt Readings from Last 3 Encounters:  09/10/17 206 lb (93.4 kg)  08/29/17 207 lb 9.6 oz (94.2 kg)  07/18/17 207 lb 3.2 oz (94 kg)      ASSESSMENT AND PLAN:  1  MV dz  No murmur on exam   It has been almost 2 years since previous   Will repeat    2  Dizzienss   Does not sound like arrhythmia    Will get echo    I have encouraged him to watch what he does   If dizzy    Sit  3 HL  Keep on Crestor Need to get lipids      4  BP  Follow  Good    HR is a little low    Some fatigue and with dizzines   I would decrease and taper off of metoprolol     5  Renal  Follow with J Colodonato  6  Obesity   He admits to eating more than should   Continue dietary    Stay active    Plan for f/u in 1 year       Current medicines are reviewed at length with the patient today.  The patient does not have concerns regarding medicines.  Signed, Dorris Carnes, MD  09/11/2017 8:16 PM    [facility] [place], [place], [place]  [address] Phone: [phone]; Fax: [phone]

## 2017-10-01 DIAGNOSIS — Z125 Encounter for screening for malignant neoplasm of prostate: Secondary | ICD-10-CM | POA: Diagnosis not present

## 2017-10-01 DIAGNOSIS — C641 Malignant neoplasm of right kidney, except renal pelvis: Secondary | ICD-10-CM | POA: Diagnosis not present

## 2017-10-02 DIAGNOSIS — F3342 Major depressive disorder, recurrent, in full remission: Secondary | ICD-10-CM | POA: Diagnosis not present

## 2017-10-04 ENCOUNTER — Other Ambulatory Visit: Payer: Self-pay | Admitting: Urology

## 2017-10-04 ENCOUNTER — Ambulatory Visit (HOSPITAL_COMMUNITY)
Admission: RE | Admit: 2017-10-04 | Discharge: 2017-10-04 | Disposition: A | Discharge status: Home / Self Care | Payer: BLUE CROSS/BLUE SHIELD | Source: Ambulatory Visit | Attending: Urology | Admitting: Urology

## 2017-10-04 DIAGNOSIS — C641 Malignant neoplasm of right kidney, except renal pelvis: Secondary | ICD-10-CM

## 2017-10-04 DIAGNOSIS — Z85528 Personal history of other malignant neoplasm of kidney: Secondary | ICD-10-CM | POA: Diagnosis not present

## 2017-10-07 DIAGNOSIS — Z905 Acquired absence of kidney: Secondary | ICD-10-CM | POA: Diagnosis not present

## 2017-10-07 DIAGNOSIS — C641 Malignant neoplasm of right kidney, except renal pelvis: Secondary | ICD-10-CM | POA: Diagnosis not present

## 2017-10-07 DIAGNOSIS — R3914 Feeling of incomplete bladder emptying: Secondary | ICD-10-CM | POA: Diagnosis not present

## 2017-10-07 DIAGNOSIS — N183 Chronic kidney disease, stage 3 (moderate): Secondary | ICD-10-CM | POA: Diagnosis not present

## 2017-10-15 ENCOUNTER — Other Ambulatory Visit: Payer: Self-pay

## 2017-10-15 ENCOUNTER — Ambulatory Visit (HOSPITAL_COMMUNITY): Payer: BLUE CROSS/BLUE SHIELD | Attending: Cardiovascular Disease

## 2017-10-15 DIAGNOSIS — I340 Nonrheumatic mitral (valve) insufficiency: Secondary | ICD-10-CM | POA: Insufficient documentation

## 2017-11-07 ENCOUNTER — Ambulatory Visit: Payer: BLUE CROSS/BLUE SHIELD | Admitting: Dietician

## 2017-11-11 DIAGNOSIS — N183 Chronic kidney disease, stage 3 (moderate): Secondary | ICD-10-CM | POA: Diagnosis not present

## 2017-11-11 DIAGNOSIS — I129 Hypertensive chronic kidney disease with stage 1 through stage 4 chronic kidney disease, or unspecified chronic kidney disease: Secondary | ICD-10-CM | POA: Diagnosis not present

## 2017-11-11 DIAGNOSIS — N189 Chronic kidney disease, unspecified: Secondary | ICD-10-CM | POA: Diagnosis not present

## 2017-11-11 DIAGNOSIS — Z905 Acquired absence of kidney: Secondary | ICD-10-CM | POA: Diagnosis not present

## 2017-11-11 DIAGNOSIS — D631 Anemia in chronic kidney disease: Secondary | ICD-10-CM | POA: Diagnosis not present

## 2017-12-09 ENCOUNTER — Other Ambulatory Visit: Payer: Self-pay | Admitting: Internal Medicine

## 2017-12-13 ENCOUNTER — Ambulatory Visit (INDEPENDENT_AMBULATORY_CARE_PROVIDER_SITE_OTHER): Payer: BLUE CROSS/BLUE SHIELD | Admitting: Internal Medicine

## 2017-12-13 ENCOUNTER — Encounter: Payer: Self-pay | Admitting: Internal Medicine

## 2017-12-13 VITALS — BP 108/64 | HR 60 | Ht 68.5 in | Wt 207.6 lb

## 2017-12-13 DIAGNOSIS — I340 Nonrheumatic mitral (valve) insufficiency: Secondary | ICD-10-CM | POA: Diagnosis not present

## 2017-12-13 DIAGNOSIS — E782 Mixed hyperlipidemia: Secondary | ICD-10-CM | POA: Diagnosis not present

## 2017-12-13 DIAGNOSIS — I10 Essential (primary) hypertension: Secondary | ICD-10-CM | POA: Diagnosis not present

## 2017-12-13 NOTE — Progress Notes (Addendum)
Cardiology Office Note   Date:  12/13/2017   ID:  Jamail Cullers, DOB [birth year], MRN [id number]  PCP:  Josetta Huddle, MD  Cardiologist:   Dorris Carnes, MD    F/U of MV dz     History of Present Illness: [name] is a 62 y.o. 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 with resultant CVA then bleed  Last echo was in 2017     TEE was also  done showing thickened MV but no vegetation  Mild to mod MR  Prolapse noted   The patient was admitted for sepsis   He was incidentally found to have a rena lmass  Found to have renal cell CA  Underwent nephrectomy in Feb 2018  I saw the pt earlier this fall   At that visit he complained of some dizziness His HR wa sa little slow   I recomm tapering off of metoprolol  Since seen he has cut back to 1/2 of metoprolol   Still taking    He denies dizziness   Denies CP   No SOB   No palpitations    Outpatient Medications Prior to Visit  Medication Sig Dispense Refill  . amLODipine (NORVASC) 2.5 MG tablet Take 2.5 mg by mouth daily.  4  . buPROPion (WELLBUTRIN XL) 150 MG 24 hr tablet Take 150 mg by mouth daily.    . Cholecalciferol (VITAMIN D3) 2000 units TABS Take 2,000 Units by mouth daily.    . finasteride (PROSCAR) 5 MG tablet Take 1 tablet (5 mg total) by mouth daily. 30 tablet 0  . losartan (COZAAR) 50 MG tablet Take 50 mg by mouth daily.  4  . Melatonin 5 MG TABS Take 10 mg by mouth at bedtime.     . metoprolol tartrate (LOPRESSOR) 25 MG tablet Take 1 tablet daily for one week, then 1/2 tablet daily for one week then stop 60 tablet 0  . Omega-3 Fatty Acids (FISH OIL PO) Take 1 capsule by mouth daily.     . rosuvastatin (CRESTOR) 10 MG tablet TAKE 1 TABLET ONCE DAILY. 90 tablet 2   No facility-administered medications prior to visit.      Allergies:   Patient has no known allergies.   Past Medical History:  Diagnosis Date  . Bacteremia 110/2017  . Cancer ([place])    renal mass at preop    . CVA (cerebral infarction)    hemmorhagic  CVA  . Depression   . Dyslipidemia   . Endocarditis    MV Vegitation  . History of kidney stones   . Hypertension   . Mitral regurgitation   . Myocardial infarction ([place])   . Stroke [facility])     Past Surgical History:  Procedure Laterality Date  . NO PAST SURGERIES    . ROBOT ASSISTED LAPAROSCOPIC NEPHRECTOMY Right 02/22/2016   Procedure: XI ROBOTIC ASSISTED LAPAROSCOPIC NEPHRECTOMY;  Surgeon: Alexis Frock, MD;  Location: [facility];  Service: Urology;  Laterality: Right;  . TONSILLECTOMY       Social History:  The patient  reports that he has never smoked. He has never used smokeless tobacco. He reports current alcohol use of about 7.0 standard drinks of alcohol per week. He reports current drug use. Drug: Marijuana.   Family History:  The patient's family history includes Heart disease in his mother.    ROS:  Please see the history of present illness. All other systems are  reviewed and  Negative to the above problem except as noted.    PHYSICAL EXAM: VS:  BP 108/64   Pulse 60   Ht 5' 8.5" (1.74 m)   Wt 207 lb 9.6 oz (94.2 kg)   SpO2 98%   BMI 31.11 kg/m   GEN: Obese 62 yo in no acute distress  HEENT: normal  Neck: JVP normal  , carotid bruits, or masses Cardiac: RRR;   No signif murmurs   No gallops,no edema  Respiratory:  clear to auscultation bilaterally, normal work of breathing GI: soft, nontender, nondistended, + BS  No hepatomegaly  MS: no deformity Moving all extremities   Skin: warm and dry, no rash Neuro:  Strength and sensation are intact Psych: euthymic mood, full affect   EKG:  EKG is not ordered today.      Lipid Panel    Component Value Date/Time   CHOL 241 (H) 01/19/2010 0904   TRIG 286 (H) 10/31/2015 0111   HDL 56.10 01/19/2010 0904   CHOLHDL 4 01/19/2010 0904   VLDL 22.4 01/19/2010 0904   LDLCALC 94 01/13/2009 0842   LDLDIRECT 164.1 01/19/2010 0904      Wt Readings from Last 3 Encounters:   12/13/17 207 lb 9.6 oz (94.2 kg)  09/10/17 206 lb (93.4 kg)  08/29/17 207 lb 9.6 oz (94.2 kg)      ASSESSMENT AND PLAN:   1.  Dizziness   Improved   Pt denies    Even though asymptomatic I would still stopp metoprolol    Follow   Stay hydrated    2  MV dz  No murmur on exam  Echo in [place] MR is mild     3 HL  Keep on Crestor  WIll get lipids        4  BP  Follow  Good Follow   l     5  Renal  Follow with J Colodonato  6  Obesity  Continue to watch diet   Go up on exercise    Stay active    Plan for f/u in 1 year       Current medicines are reviewed at length with the patient today.  The patient does not have concerns regarding medicines.  Signed, Dorris Carnes, MD  12/13/2017 11:25 AM    [place], [place],   [address] Phone: [phone]; Fax: [phone]

## 2017-12-13 NOTE — Patient Instructions (Addendum)
Medication Instructions:  STOP METOPROLOL If you need a refill on your cardiac medications before your next appointment, please call your pharmacy.   Lab work: Today: bmet, lipids If you have labs (blood work) drawn today and your tests are completely normal, you will receive your results only by: Marland Kitchen MyChart Message (if you have MyChart) OR . A paper copy in the mail If you have any lab test that is abnormal or we need to change your treatment, we will call you to review the results.  Testing/Procedures: none  Follow-Up: At [facility], you and your health needs are our priority.  As part of our continuing mission to provide you with exceptional heart care, we have created designated Provider Care Teams.  These Care Teams include your primary Cardiologist (physician) and Advanced Practice Providers (APPs -  Physician Assistants and Nurse Practitioners) who all work together to provide you with the care you need, when you need it. You will need a follow up appointment in: ABOUT 10  months.  Please call our office 2 months in advance to schedule this appointment.  You may see Dorris Carnes, MD or one of the following Advanced Practice Providers on your designated Care Team: Richardson Dopp, PA-C [place], [place] . Daune Perch, NP  Any Other Special Instructions Will Be Listed Below (If Applicable).

## 2017-12-14 LAB — BASIC METABOLIC PANEL
BUN/Creatinine Ratio: 11 (ref 10–24)
BUN: 18 mg/dL (ref 8–27)
CO2: 24 mmol/L (ref 20–29)
CREATININE: 1.61 mg/dL — AB (ref 0.76–1.27)
Calcium: 9.6 mg/dL (ref 8.6–10.2)
Chloride: 102 mmol/L (ref 96–106)
GFR calc non Af Amer: 45 mL/min/{1.73_m2} — ABNORMAL LOW (ref >59)
GFR, EST AFRICAN AMERICAN: 53 mL/min/{1.73_m2} — AB (ref >59)
Glucose: 85 mg/dL (ref 65–99)
Potassium: 4.9 mmol/L (ref 3.5–5.2)
Sodium: 140 mmol/L (ref 134–144)

## 2017-12-14 LAB — LIPID PANEL
Chol/HDL Ratio: 2.8 ratio (ref 0.0–5.0)
Cholesterol, Total: 151 mg/dL (ref 100–199)
HDL: 53 mg/dL (ref >39)
LDL Calculated: 75 mg/dL (ref 0–99)
Triglycerides: 114 mg/dL (ref 0–149)
VLDL Cholesterol Cal: 23 mg/dL (ref 5–40)

## 2018-02-12 DIAGNOSIS — Z0001 Encounter for general adult medical examination with abnormal findings: Secondary | ICD-10-CM | POA: Diagnosis not present

## 2018-02-12 DIAGNOSIS — E559 Vitamin D deficiency, unspecified: Secondary | ICD-10-CM | POA: Diagnosis not present

## 2018-02-12 DIAGNOSIS — F09 Unspecified mental disorder due to known physiological condition: Secondary | ICD-10-CM | POA: Diagnosis not present

## 2018-02-12 DIAGNOSIS — Z79899 Other long term (current) drug therapy: Secondary | ICD-10-CM | POA: Diagnosis not present

## 2018-02-12 DIAGNOSIS — Z125 Encounter for screening for malignant neoplasm of prostate: Secondary | ICD-10-CM | POA: Diagnosis not present

## 2018-02-12 DIAGNOSIS — Z905 Acquired absence of kidney: Secondary | ICD-10-CM | POA: Diagnosis not present

## 2018-02-12 DIAGNOSIS — E782 Mixed hyperlipidemia: Secondary | ICD-10-CM | POA: Diagnosis not present

## 2018-02-12 DIAGNOSIS — I10 Essential (primary) hypertension: Secondary | ICD-10-CM | POA: Diagnosis not present

## 2018-03-17 DIAGNOSIS — Z905 Acquired absence of kidney: Secondary | ICD-10-CM | POA: Diagnosis not present

## 2018-03-17 DIAGNOSIS — N183 Chronic kidney disease, stage 3 (moderate): Secondary | ICD-10-CM | POA: Diagnosis not present

## 2018-03-17 DIAGNOSIS — I129 Hypertensive chronic kidney disease with stage 1 through stage 4 chronic kidney disease, or unspecified chronic kidney disease: Secondary | ICD-10-CM | POA: Diagnosis not present

## 2018-03-17 DIAGNOSIS — D631 Anemia in chronic kidney disease: Secondary | ICD-10-CM | POA: Diagnosis not present

## 2018-06-24 DIAGNOSIS — R509 Fever, unspecified: Secondary | ICD-10-CM | POA: Diagnosis not present

## 2018-06-24 DIAGNOSIS — R05 Cough: Secondary | ICD-10-CM | POA: Diagnosis not present

## 2018-08-22 ENCOUNTER — Other Ambulatory Visit: Payer: Self-pay | Admitting: Internal Medicine

## 2018-09-11 DIAGNOSIS — Z125 Encounter for screening for malignant neoplasm of prostate: Secondary | ICD-10-CM | POA: Diagnosis not present

## 2018-09-11 DIAGNOSIS — C641 Malignant neoplasm of right kidney, except renal pelvis: Secondary | ICD-10-CM | POA: Diagnosis not present

## 2018-09-12 ENCOUNTER — Other Ambulatory Visit (HOSPITAL_COMMUNITY): Payer: Self-pay | Admitting: Urology

## 2018-09-12 ENCOUNTER — Other Ambulatory Visit: Payer: Self-pay

## 2018-09-12 ENCOUNTER — Ambulatory Visit (HOSPITAL_COMMUNITY)
Admission: RE | Admit: 2018-09-12 | Discharge: 2018-09-12 | Disposition: A | Discharge status: Home / Self Care | Payer: BC Managed Care – PPO | Source: Ambulatory Visit | Attending: Urology | Admitting: Urology

## 2018-09-12 DIAGNOSIS — Z85528 Personal history of other malignant neoplasm of kidney: Secondary | ICD-10-CM | POA: Diagnosis not present

## 2018-09-12 DIAGNOSIS — C641 Malignant neoplasm of right kidney, except renal pelvis: Secondary | ICD-10-CM

## 2018-09-12 DIAGNOSIS — C649 Malignant neoplasm of unspecified kidney, except renal pelvis: Secondary | ICD-10-CM | POA: Diagnosis not present

## 2018-09-15 DIAGNOSIS — C641 Malignant neoplasm of right kidney, except renal pelvis: Secondary | ICD-10-CM | POA: Diagnosis not present

## 2018-09-15 DIAGNOSIS — Z905 Acquired absence of kidney: Secondary | ICD-10-CM | POA: Diagnosis not present

## 2018-09-15 DIAGNOSIS — N183 Chronic kidney disease, stage 3 (moderate): Secondary | ICD-10-CM | POA: Diagnosis not present

## 2018-09-15 DIAGNOSIS — R3914 Feeling of incomplete bladder emptying: Secondary | ICD-10-CM | POA: Diagnosis not present

## 2018-09-17 DIAGNOSIS — I10 Essential (primary) hypertension: Secondary | ICD-10-CM | POA: Diagnosis not present

## 2018-09-30 DIAGNOSIS — B078 Other viral warts: Secondary | ICD-10-CM | POA: Diagnosis not present

## 2018-09-30 DIAGNOSIS — B009 Herpesviral infection, unspecified: Secondary | ICD-10-CM | POA: Diagnosis not present

## 2018-10-01 DIAGNOSIS — G4709 Other insomnia: Secondary | ICD-10-CM | POA: Diagnosis not present

## 2018-10-01 DIAGNOSIS — F3342 Major depressive disorder, recurrent, in full remission: Secondary | ICD-10-CM | POA: Diagnosis not present

## 2018-10-15 DIAGNOSIS — Z905 Acquired absence of kidney: Secondary | ICD-10-CM | POA: Diagnosis not present

## 2018-10-15 DIAGNOSIS — N183 Chronic kidney disease, stage 3 unspecified: Secondary | ICD-10-CM | POA: Diagnosis not present

## 2018-10-15 DIAGNOSIS — E559 Vitamin D deficiency, unspecified: Secondary | ICD-10-CM | POA: Diagnosis not present

## 2018-10-15 DIAGNOSIS — N189 Chronic kidney disease, unspecified: Secondary | ICD-10-CM | POA: Diagnosis not present

## 2018-10-15 DIAGNOSIS — I129 Hypertensive chronic kidney disease with stage 1 through stage 4 chronic kidney disease, or unspecified chronic kidney disease: Secondary | ICD-10-CM | POA: Diagnosis not present

## 2018-10-15 DIAGNOSIS — D631 Anemia in chronic kidney disease: Secondary | ICD-10-CM | POA: Diagnosis not present

## 2018-11-05 IMAGING — DX DG LUMBAR SPINE COMPLETE 4+V
5 series · 5 of 5 positions shown · non-contrast
Comparison: None.

CLINICAL DATA: Fall with lumbosacral back pain.

EXAM:
LUMBAR SPINE - COMPLETE 4+ VIEW

[l-spine ap]
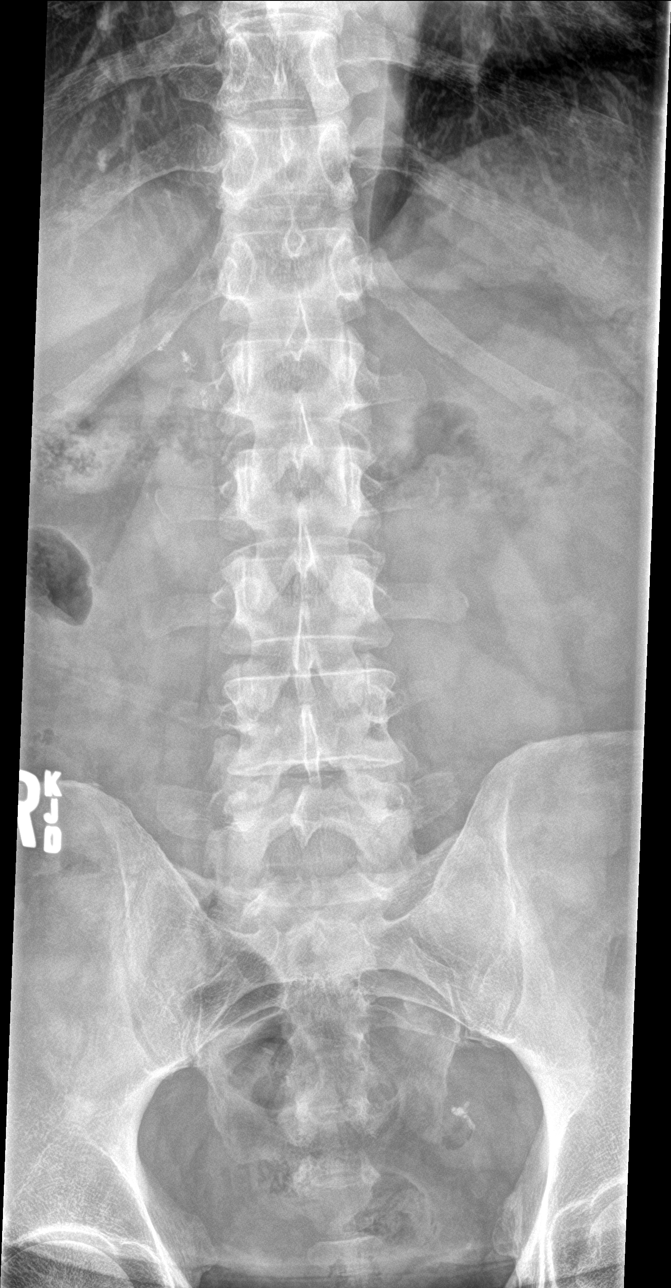

[l-spine obl (1 of 2)]
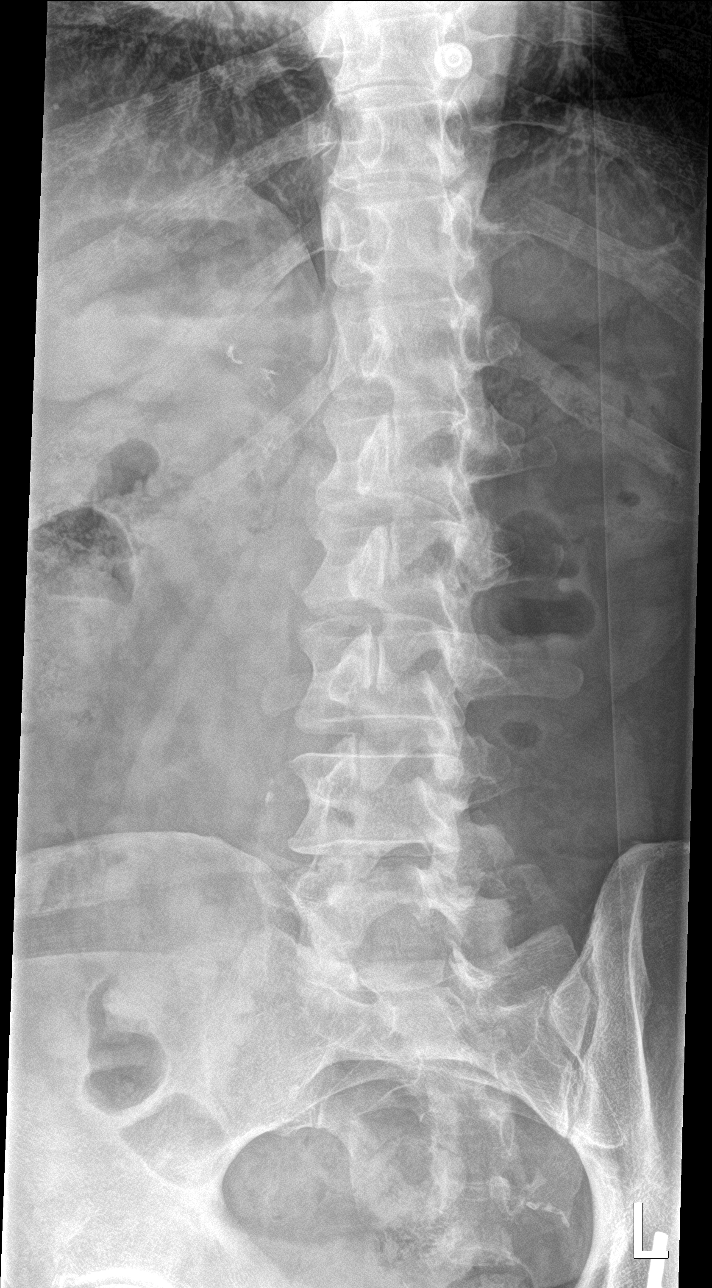

[l-spine obl (2 of 2)]
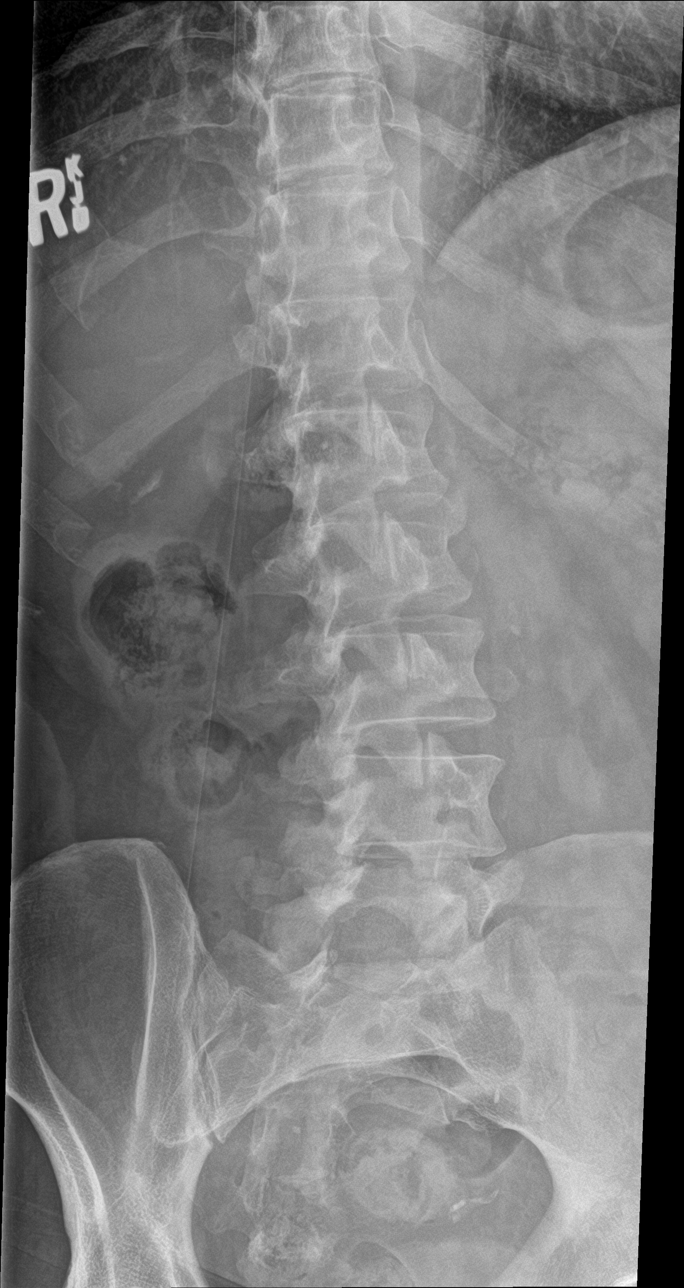

[l-spine lat]
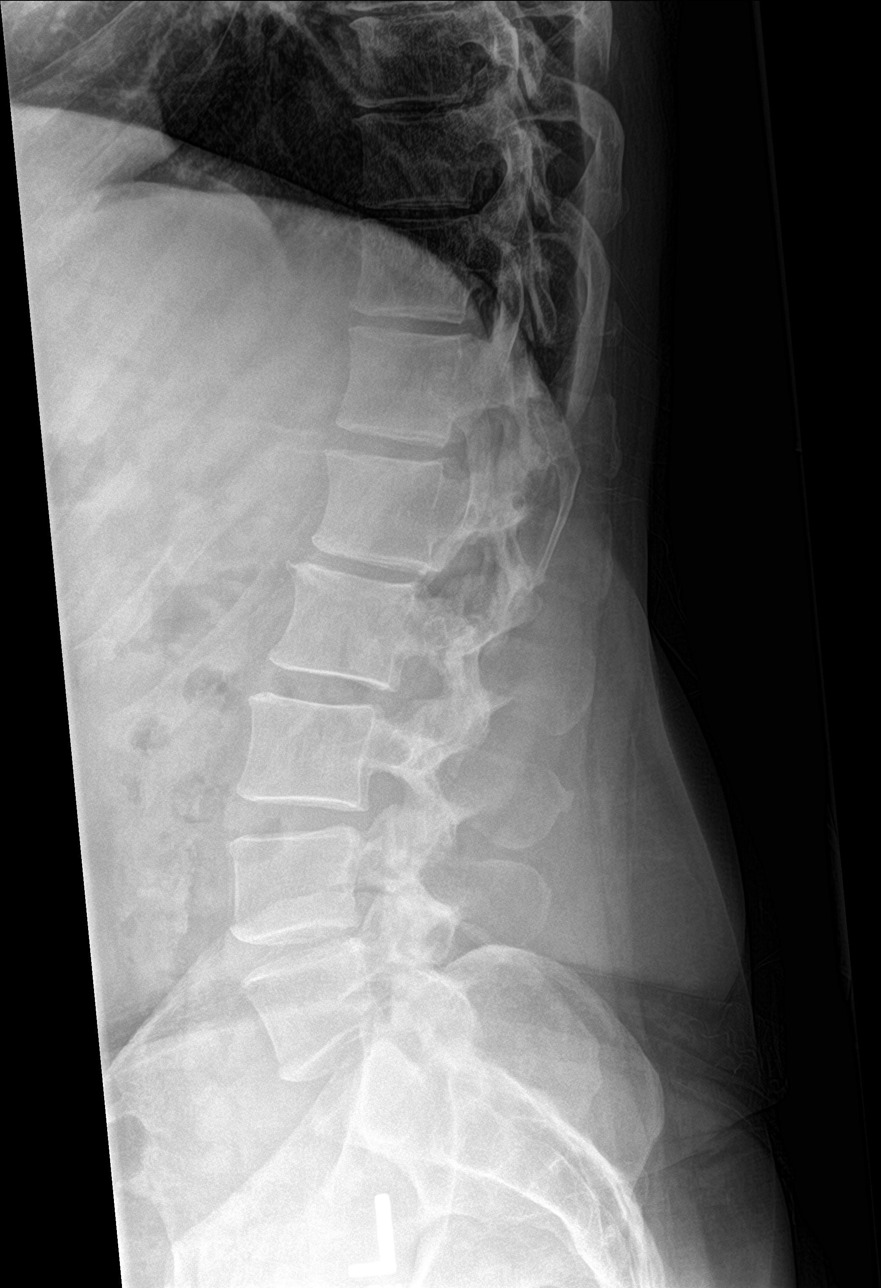

[l-spine spot]
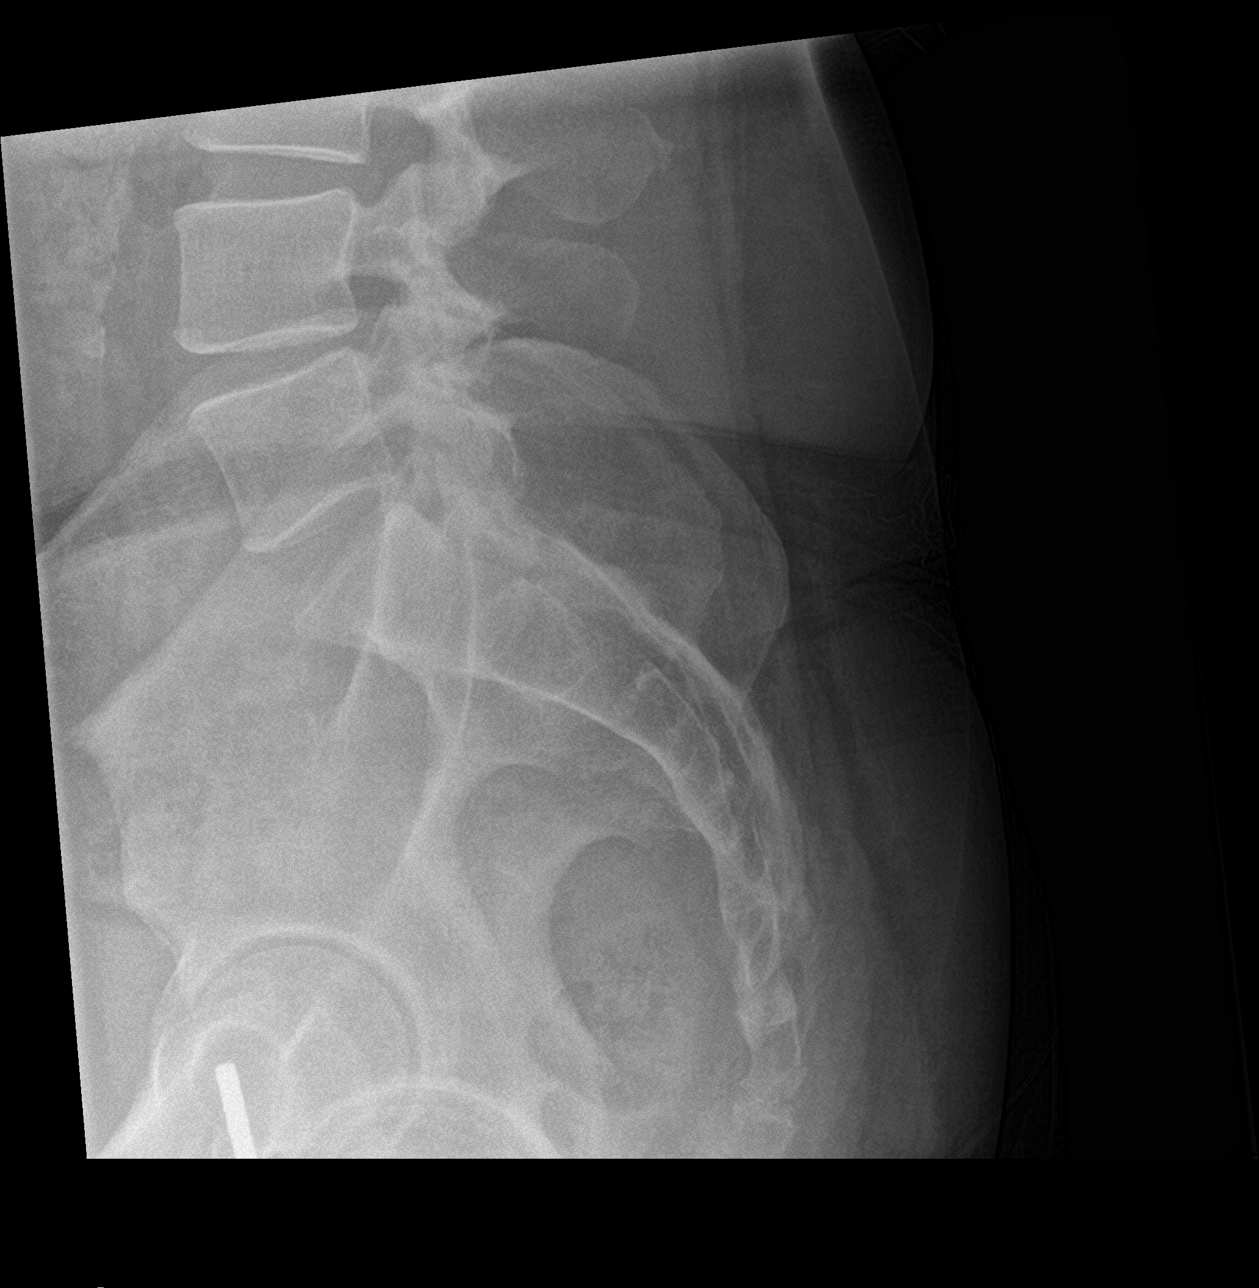

[5 of 5 positions shown; findings below may reference images not displayed]

FINDINGS: The alignment is maintained. Vertebral body heights are normal.
There is no listhesis. The posterior elements are intact. Mild disc
space narrowing and endplate spurring at L1-L2 and L4-L5. No
fracture. Sacroiliac joints are symmetric and normal. Surgical clips
to the right of L1 from prior nephrectomy.
IMPRESSION: No acute fracture of the lumbar spine. Mild for age degenerative
change.

## 2018-11-05 IMAGING — DX DG ANKLE COMPLETE 3+V*R*
3 series · 3 of 3 positions shown · non-contrast
Comparison: None.

CLINICAL DATA: Right foot and ankle pain after fall.

EXAM:
RIGHT ANKLE - COMPLETE 3+ VIEW

[ankle ap]
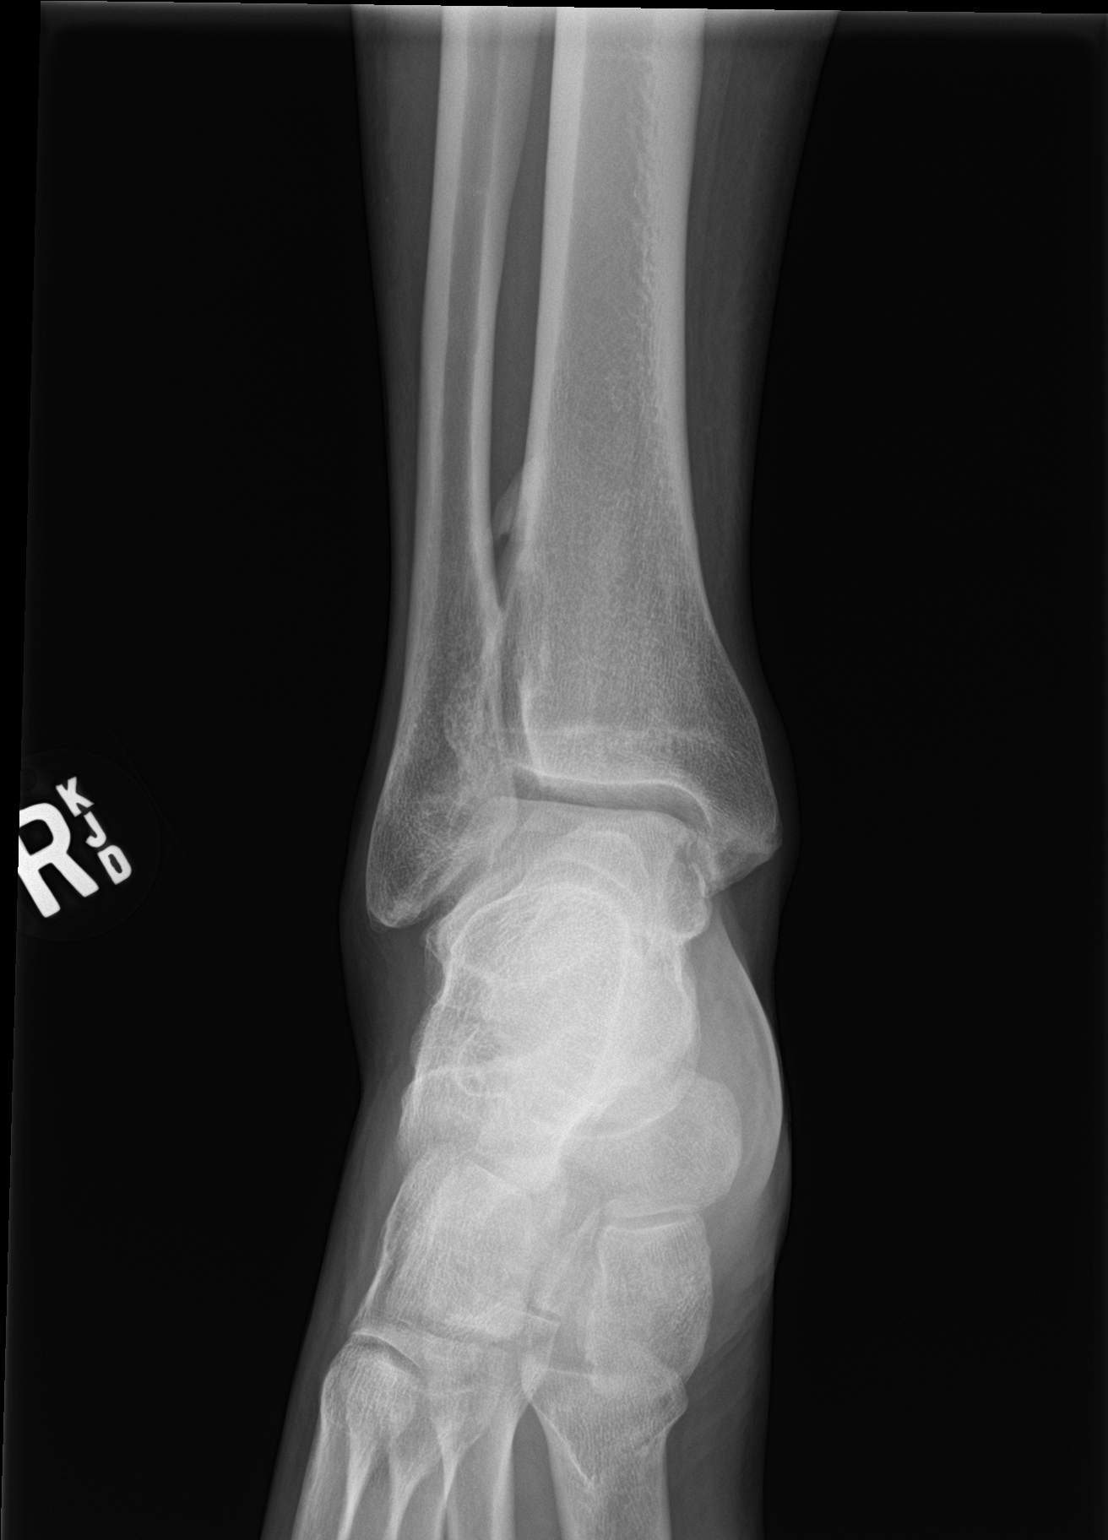

[ankle obl]
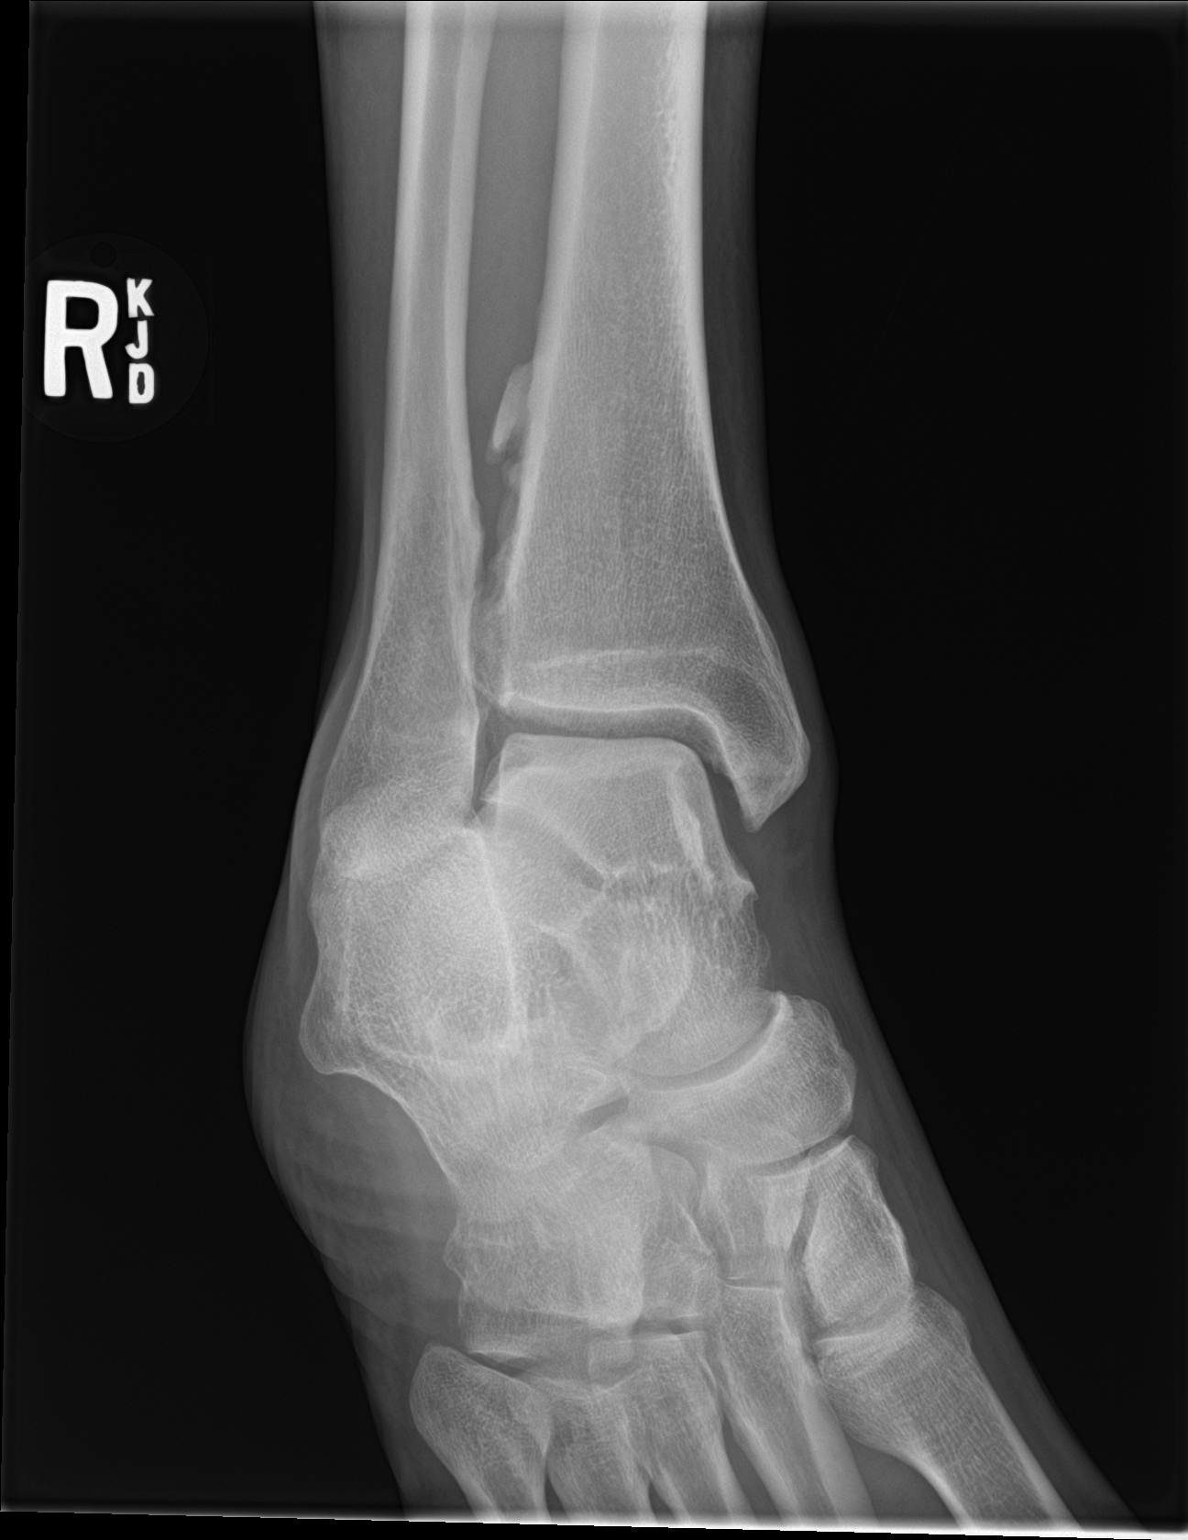

[ankle lat]
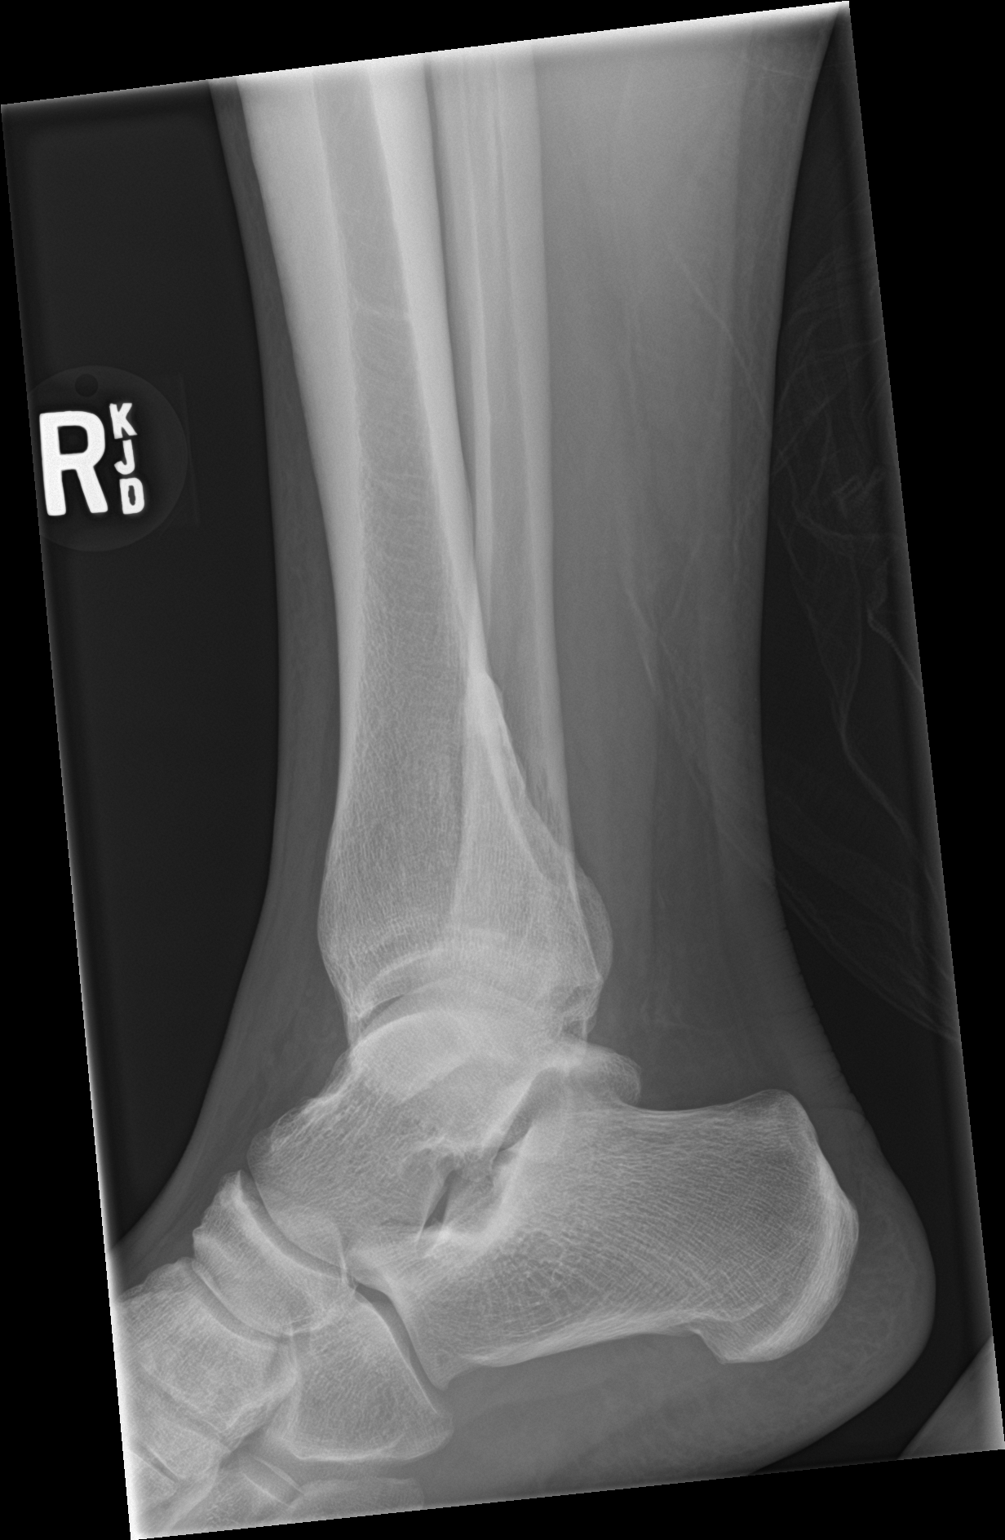

[3 of 3 positions shown; findings below may reference images not displayed]

FINDINGS: There is no evidence of fracture, dislocation, or joint effusion.
Chronic periosteal thickening in the region of the interosseous
membrane. There is no evidence of arthropathy or other focal bone
abnormality. Soft tissues are unremarkable.
IMPRESSION: No fracture or dislocation of the right ankle.

## 2018-11-23 NOTE — Progress Notes (Signed)
Cardiology Office Note   Date:  11/24/2018   ID:  [name], DOB [birth year], MRN [id number]  PCP:  Josetta Huddle, MD  Cardiologist:   Dorris Carnes, MD    F/U of MV dz     History of Present Illness: [name] is a 63 y.o. male with a history of endocarditis in 2010  Hospital course complicated by MI and also emoboloc event to brain with resultant CVA and  then bleed    TEE was also  done showing thickened MV but no vegetation  Mild to mod MR  Prolapse noted   In 2017 the patient was admitted for sepsis   He was incidentally found to have a renal mass  and renal cell CA  Underwent nephrectomy in Feb 2018 He was seen in ED on 06/16/17   He got up to go to bathroom  Urinated   On getting up dizzy   Went to floor  Broke nose   He denies syncope  Sent home from ED    I aw the pt in Sept 2019 echocardiogram after this visit was done.  LVEF was normal at 55 to 60%.  The pt continues to do well   He is fairly active  Moving to [place] Denies SOB  NO CP  No dizziness   No LE edema  No palpitations      Outpatient Medications Prior to Visit  Medication Sig Dispense Refill  . amLODipine (NORVASC) 5 MG tablet Take 5 mg by mouth daily.    . BELSOMRA 10 MG TABS Take 1 tablet by mouth at bedtime as needed.    Marland Kitchen buPROPion (WELLBUTRIN XL) 150 MG 24 hr tablet Take 150 mg by mouth daily.    . Cholecalciferol (VITAMIN D3) 2000 units TABS Take 2,000 Units by mouth daily.    . finasteride (PROSCAR) 5 MG tablet Take 1 tablet (5 mg total) by mouth daily. 30 tablet 0  . losartan (COZAAR) 100 MG tablet Take 100 mg by mouth daily.    Marland Kitchen MAGNESIUM PO Take 1 tablet by mouth daily.    . Melatonin 5 MG TABS Take 10 mg by mouth at bedtime.     . Omega-3 Fatty Acids (FISH OIL PO) Take 1 capsule by mouth daily.     . rosuvastatin (CRESTOR) 10 MG tablet TAKE 1 TABLET ONCE DAILY. 90 tablet 1  . amLODipine (NORVASC) 2.5 MG tablet Take 2.5 mg by mouth daily.  4  . losartan (COZAAR) 50 MG tablet Take 50 mg by  mouth daily.  4   No facility-administered medications prior to visit.      Allergies:   Patient has no known allergies.   Past Medical History:  Diagnosis Date  . Bacteremia 110/2017  . Cancer ([place])    renal mass at preop  . CVA (cerebral infarction)    hemmorhagic  CVA  . Depression   . Dyslipidemia   . Endocarditis    MV Vegitation  . History of kidney stones   . Hypertension   . Mitral regurgitation   . Myocardial infarction ([place])   . Stroke [facility])     Past Surgical History:  Procedure Laterality Date  . NO PAST SURGERIES    . ROBOT ASSISTED LAPAROSCOPIC NEPHRECTOMY Right 02/22/2016   Procedure: XI ROBOTIC ASSISTED LAPAROSCOPIC NEPHRECTOMY;  Surgeon: Alexis Frock, MD;  Location[facility];  Service: Urology;  Laterality: Right;  . TONSILLECTOMY       Social History:  The patient  reports that he has never smoked. He has never used smokeless tobacco. He reports current alcohol use of about 7.0 standard drinks of alcohol per week. He reports current drug use. Drug: Marijuana.   Family History:  The patient's family history includes Heart disease in his mother.    ROS:  Please see the history of present illness. All other systems are reviewed and  Negative to the above problem except as noted.    PHYSICAL EXAM: VS:  BP 128/72   Pulse 95   Ht 5\' 8"  (1.727 m)   Wt 211 lb (95.7 kg)   SpO2 96%   BMI 32.08 kg/m   GEN: Obese 63 yo in no acute distress  HEENT: normal  Neck: JVP normal  , carotid bruits, or masses Cardiac: RRR; NO murmur, rubs, or gallops,no edema  Respiratory:  clear to auscultation bilaterally, normal work of breathing GI: soft, nontender, nondistended, + BS  No hepatomegaly  MS: no deformity Moving all extremities   Skin: warm and dry, no rash Neuro:  Strength and sensation are intact Psych: euthymic mood, full affect   EKG:  EKG is ordered today.    SR 88 bpm   RBBB  Occasional PVC   LVH   Lipid Panel    Component Value Date/Time   CHOL  151 12/13/2017 1147   TRIG 114 12/13/2017 1147   HDL 53 12/13/2017 1147   CHOLHDL 2.8 12/13/2017 1147   CHOLHDL 4 01/19/2010 0904   VLDL 22.4 01/19/2010 0904   LDLCALC 75 12/13/2017 1147   LDLDIRECT 164.1 01/19/2010 0904      Wt Readings from Last 3 Encounters:  11/24/18 211 lb (95.7 kg)  12/13/17 207 lb 9.6 oz (94.2 kg)  09/10/17 206 lb (93.4 kg)      ASSESSMENT AND PLAN:  1  MV dz  Echo with mild MR last year   FOllow  2  Dizzienss   Pt denies    3 HL  Keep on Crestor  LDL has been better in the past  Work on diet    4  BP  BP is OK  FOllow    5  Renal  Follow with J Colodonato  WIll get labs from that office  6  Obesity   Watch diet esp fats     Stay active    Plan for f/u in 1 year       Current medicines are reviewed at length with the patient today.  The patient does not have concerns regarding medicines.  Signed, Dorris Carnes, MD  11/24/2018 9:40 PM    [address] [place] [place], [place], [place]  [address] Phone: [phone]; Fax: [phone]

## 2018-11-24 ENCOUNTER — Other Ambulatory Visit: Payer: Self-pay

## 2018-11-24 ENCOUNTER — Ambulatory Visit (INDEPENDENT_AMBULATORY_CARE_PROVIDER_SITE_OTHER): Payer: BC Managed Care – PPO | Admitting: Internal Medicine

## 2018-11-24 ENCOUNTER — Encounter: Payer: Self-pay | Admitting: Internal Medicine

## 2018-11-24 VITALS — BP 128/72 | HR 95 | Ht 68.0 in | Wt 211.0 lb

## 2018-11-24 DIAGNOSIS — E782 Mixed hyperlipidemia: Secondary | ICD-10-CM

## 2018-11-24 NOTE — Patient Instructions (Signed)
Medication Instructions:  No changes *If you need a refill on your cardiac medications before your next appointment, please call your pharmacy*  Lab Work: Will request labs from [facility]. If you have labs (blood work) drawn today and your tests are completely normal, you will receive your results only by: Marland Kitchen MyChart Message (if you have MyChart) OR . A paper copy in the mail If you have any lab test that is abnormal or we need to change your treatment, we will call you to review the results.  Testing/Procedures: none  Follow-Up: At [facility], you and your health needs are our priority.  As part of our continuing mission to provide you with exceptional heart care, we have created designated Provider Care Teams.  These Care Teams include your primary Cardiologist (physician) and Advanced Practice Providers (APPs -  Physician Assistants and Nurse Practitioners) who all work together to provide you with the care you need, when you need it.  Your next appointment:   12 month(s)  The format for your next appointment:   In Person  Provider:   Dorris Carnes, MD  Other Instructions

## 2018-12-25 ENCOUNTER — Other Ambulatory Visit: Payer: BC Managed Care – PPO

## 2018-12-26 DIAGNOSIS — Z20828 Contact with and (suspected) exposure to other viral communicable diseases: Secondary | ICD-10-CM | POA: Diagnosis not present

## 2018-12-29 ENCOUNTER — Ambulatory Visit: Payer: BC Managed Care – PPO | Attending: Internal Medicine

## 2018-12-29 DIAGNOSIS — Z20822 Contact with and (suspected) exposure to covid-19: Secondary | ICD-10-CM

## 2018-12-29 DIAGNOSIS — Z20828 Contact with and (suspected) exposure to other viral communicable diseases: Secondary | ICD-10-CM | POA: Diagnosis not present

## 2018-12-31 LAB — NOVEL CORONAVIRUS, NAA: SARS-CoV-2, NAA: NOT DETECTED

## 2019-01-01 DIAGNOSIS — D225 Melanocytic nevi of trunk: Secondary | ICD-10-CM | POA: Diagnosis not present

## 2019-01-01 DIAGNOSIS — D2272 Melanocytic nevi of left lower limb, including hip: Secondary | ICD-10-CM | POA: Diagnosis not present

## 2019-01-01 DIAGNOSIS — D2261 Melanocytic nevi of right upper limb, including shoulder: Secondary | ICD-10-CM | POA: Diagnosis not present

## 2019-01-01 DIAGNOSIS — D2271 Melanocytic nevi of right lower limb, including hip: Secondary | ICD-10-CM | POA: Diagnosis not present

## 2019-02-25 DIAGNOSIS — N529 Male erectile dysfunction, unspecified: Secondary | ICD-10-CM | POA: Diagnosis not present

## 2019-02-25 DIAGNOSIS — H6123 Impacted cerumen, bilateral: Secondary | ICD-10-CM | POA: Diagnosis not present

## 2019-02-25 DIAGNOSIS — E782 Mixed hyperlipidemia: Secondary | ICD-10-CM | POA: Diagnosis not present

## 2019-02-25 DIAGNOSIS — Z125 Encounter for screening for malignant neoplasm of prostate: Secondary | ICD-10-CM | POA: Diagnosis not present

## 2019-02-25 DIAGNOSIS — Z905 Acquired absence of kidney: Secondary | ICD-10-CM | POA: Diagnosis not present

## 2019-02-25 DIAGNOSIS — Z79899 Other long term (current) drug therapy: Secondary | ICD-10-CM | POA: Diagnosis not present

## 2019-02-25 DIAGNOSIS — Z23 Encounter for immunization: Secondary | ICD-10-CM | POA: Diagnosis not present

## 2019-02-25 DIAGNOSIS — Z0001 Encounter for general adult medical examination with abnormal findings: Secondary | ICD-10-CM | POA: Diagnosis not present

## 2019-03-01 ENCOUNTER — Ambulatory Visit: Payer: BC Managed Care – PPO | Attending: Internal Medicine

## 2019-03-01 DIAGNOSIS — Z23 Encounter for immunization: Secondary | ICD-10-CM

## 2019-03-01 NOTE — Progress Notes (Signed)
   Covid-19 Vaccination Clinic  Name:  [name]    MRN: [id number] DOB: Jul 19, 1955  03/01/2019  [name] was observed post Covid-19 immunization for 15 minutes without incidence. He was provided with Vaccine Information Sheet and instruction to access the V-Safe system.   [name] was instructed to call 911 with any severe reactions post vaccine: Marland Kitchen Difficulty breathing  . Swelling of your face and throat  . A fast heartbeat  . A bad rash all over your body  . Dizziness and weakness    Immunizations Administered    Name Date Dose VIS Date Route   Pfizer COVID-19 Vaccine 03/01/2019  4:59 PM 0.3 mL 12/12/2018 Intramuscular   Manufacturer: [place]   Lot: [id number]   [place]: [id number]

## 2019-03-03 ENCOUNTER — Other Ambulatory Visit: Payer: Self-pay | Admitting: Internal Medicine

## 2019-03-25 ENCOUNTER — Ambulatory Visit: Payer: BC Managed Care – PPO | Attending: Internal Medicine

## 2019-03-25 DIAGNOSIS — Z23 Encounter for immunization: Secondary | ICD-10-CM

## 2019-03-25 NOTE — Progress Notes (Signed)
   Covid-19 Vaccination Clinic  Name:  [name]    MRN: [id number] DOB: 22-May-1955  03/25/2019  [name] was observed post Covid-19 immunization for 15 minutes without incident. He was provided with Vaccine Information Sheet and instruction to access the V-Safe system.   [name] was instructed to call 911 with any severe reactions post vaccine: Marland Kitchen Difficulty breathing  . Swelling of face and throat  . A fast heartbeat  . A bad rash all over body  . Dizziness and weakness   Immunizations Administered    Name Date Dose VIS Date Route   Pfizer COVID-19 Vaccine 03/25/2019  2:13 PM 0.3 mL 12/12/2018 Intramuscular   Manufacturer: [place]   Lot: [id number]   [place]: [id number]

## 2019-05-05 DIAGNOSIS — Z01818 Encounter for other preprocedural examination: Secondary | ICD-10-CM | POA: Diagnosis not present

## 2019-05-05 DIAGNOSIS — Z1211 Encounter for screening for malignant neoplasm of colon: Secondary | ICD-10-CM | POA: Diagnosis not present

## 2019-05-14 DIAGNOSIS — Z905 Acquired absence of kidney: Secondary | ICD-10-CM | POA: Diagnosis not present

## 2019-05-14 DIAGNOSIS — N189 Chronic kidney disease, unspecified: Secondary | ICD-10-CM | POA: Diagnosis not present

## 2019-05-14 DIAGNOSIS — I129 Hypertensive chronic kidney disease with stage 1 through stage 4 chronic kidney disease, or unspecified chronic kidney disease: Secondary | ICD-10-CM | POA: Diagnosis not present

## 2019-05-14 DIAGNOSIS — D631 Anemia in chronic kidney disease: Secondary | ICD-10-CM | POA: Diagnosis not present

## 2019-05-14 DIAGNOSIS — N183 Chronic kidney disease, stage 3 unspecified: Secondary | ICD-10-CM | POA: Diagnosis not present

## 2019-05-14 DIAGNOSIS — E559 Vitamin D deficiency, unspecified: Secondary | ICD-10-CM | POA: Diagnosis not present

## 2019-05-20 DIAGNOSIS — Z1159 Encounter for screening for other viral diseases: Secondary | ICD-10-CM | POA: Diagnosis not present

## 2019-05-25 DIAGNOSIS — D122 Benign neoplasm of ascending colon: Secondary | ICD-10-CM | POA: Diagnosis not present

## 2019-05-25 DIAGNOSIS — Z1211 Encounter for screening for malignant neoplasm of colon: Secondary | ICD-10-CM | POA: Diagnosis not present

## 2019-05-25 DIAGNOSIS — K5730 Diverticulosis of large intestine without perforation or abscess without bleeding: Secondary | ICD-10-CM | POA: Diagnosis not present

## 2019-05-25 DIAGNOSIS — K640 First degree hemorrhoids: Secondary | ICD-10-CM | POA: Diagnosis not present

## 2019-09-11 DIAGNOSIS — C641 Malignant neoplasm of right kidney, except renal pelvis: Secondary | ICD-10-CM | POA: Diagnosis not present

## 2019-09-11 DIAGNOSIS — Z125 Encounter for screening for malignant neoplasm of prostate: Secondary | ICD-10-CM | POA: Diagnosis not present

## 2019-09-16 ENCOUNTER — Ambulatory Visit (HOSPITAL_COMMUNITY)
Admission: RE | Admit: 2019-09-16 | Discharge: 2019-09-16 | Disposition: A | Discharge status: Home / Self Care | Payer: BC Managed Care – PPO | Source: Ambulatory Visit | Attending: Urology | Admitting: Urology

## 2019-09-16 ENCOUNTER — Other Ambulatory Visit (HOSPITAL_COMMUNITY): Payer: Self-pay | Admitting: Urology

## 2019-09-16 ENCOUNTER — Other Ambulatory Visit: Payer: Self-pay

## 2019-09-16 DIAGNOSIS — Z85528 Personal history of other malignant neoplasm of kidney: Secondary | ICD-10-CM | POA: Diagnosis not present

## 2019-09-16 DIAGNOSIS — C641 Malignant neoplasm of right kidney, except renal pelvis: Secondary | ICD-10-CM | POA: Diagnosis not present

## 2019-09-16 DIAGNOSIS — Z905 Acquired absence of kidney: Secondary | ICD-10-CM | POA: Diagnosis not present

## 2019-09-16 DIAGNOSIS — K7689 Other specified diseases of liver: Secondary | ICD-10-CM | POA: Diagnosis not present

## 2019-09-16 DIAGNOSIS — C649 Malignant neoplasm of unspecified kidney, except renal pelvis: Secondary | ICD-10-CM | POA: Diagnosis not present

## 2019-09-16 DIAGNOSIS — M47814 Spondylosis without myelopathy or radiculopathy, thoracic region: Secondary | ICD-10-CM | POA: Diagnosis not present

## 2019-09-16 DIAGNOSIS — I700 Atherosclerosis of aorta: Secondary | ICD-10-CM | POA: Diagnosis not present

## 2019-09-18 DIAGNOSIS — C641 Malignant neoplasm of right kidney, except renal pelvis: Secondary | ICD-10-CM | POA: Diagnosis not present

## 2019-09-18 DIAGNOSIS — D4102 Neoplasm of uncertain behavior of left kidney: Secondary | ICD-10-CM | POA: Diagnosis not present

## 2019-09-18 DIAGNOSIS — R3914 Feeling of incomplete bladder emptying: Secondary | ICD-10-CM | POA: Diagnosis not present

## 2019-09-18 DIAGNOSIS — Z905 Acquired absence of kidney: Secondary | ICD-10-CM | POA: Diagnosis not present

## 2019-11-24 ENCOUNTER — Other Ambulatory Visit: Payer: Self-pay | Admitting: Internal Medicine

## 2019-11-29 NOTE — Progress Notes (Signed)
Cardiology Office Note   Date:  11/30/2019   ID:  [name], DOB [birth year], MRN [id number]  PCP:  Josetta Huddle, MD  Cardiologist:   Dorris Carnes, MD    F/U of mitral valve dz       History of Present Illness: [name] is a 64 y.o. 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 with resultant CVA and  then bleed    TEE was also  done showing thickened MV but no vegetation  Mild to mod MR  Prolapse noted   In 2017 the patient was admitted for sepsis   He was incidentally found to have a renal mass  and renal cell CA  Underwent nephrectomy in Feb 2018 He was seen in ED on 06/16/17   He got up to go to bathroom  Urinated   On getting up dizzy   Went to floor  Broke nose       I saw the pt in Nov 2020  Since seen he has done well  No CP  No SOB  No palpitations    Outpatient Medications Prior to Visit  Medication Sig Dispense Refill  . amLODipine (NORVASC) 5 MG tablet Take 5 mg by mouth daily.    . BELSOMRA 10 MG TABS Take 1 tablet by mouth at bedtime as needed.    Marland Kitchen buPROPion (WELLBUTRIN XL) 150 MG 24 hr tablet Take 150 mg by mouth daily.    . Cholecalciferol (VITAMIN D3) 2000 units TABS Take 2,000 Units by mouth daily.    . finasteride (PROSCAR) 5 MG tablet Take 1 tablet (5 mg total) by mouth daily. 30 tablet 0  . losartan (COZAAR) 100 MG tablet Take 100 mg by mouth daily.    Marland Kitchen MAGNESIUM PO Take 1 tablet by mouth daily.    . Melatonin 5 MG TABS Take 10 mg by mouth at bedtime.     . Omega-3 Fatty Acids (FISH OIL PO) Take 1 capsule by mouth daily.     . rosuvastatin (CRESTOR) 10 MG tablet TAKE 1 TABLET ONCE DAILY. 90 tablet 3   No facility-administered medications prior to visit.     Allergies:   Patient has no known allergies.   Past Medical History:  Diagnosis Date  . Bacteremia 110/2017  . Cancer ([place])    renal mass at preop  . CVA (cerebral infarction)    hemmorhagic  CVA  . Depression   .  Dyslipidemia   . Endocarditis    MV Vegitation  . History of kidney stones   . Hypertension   . Mitral regurgitation   . Myocardial infarction ([place])   . Stroke [facility])     Past Surgical History:  Procedure Laterality Date  . NO PAST SURGERIES    . ROBOT ASSISTED LAPAROSCOPIC NEPHRECTOMY Right 02/22/2016   Procedure: XI ROBOTIC ASSISTED LAPAROSCOPIC NEPHRECTOMY;  Surgeon: Alexis Frock, MD;  Location: [facility];  Service: Urology;  Laterality: Right;  . TONSILLECTOMY       Social History:  The patient  reports that he has never smoked. He has never used smokeless tobacco. He reports current alcohol use of about 7.0 standard drinks of alcohol per week. He reports current drug use. Drug: Marijuana.   Family History:  The patient's family history includes Heart disease in his mother.    ROS:  Please see the history of present illness. All other systems are reviewed and  Negative  to the above problem except as noted.    PHYSICAL EXAM: VS:  BP 122/78   Pulse (!) 57   Ht 5\' 8"  (1.727 m)   Wt 186 lb 12.8 oz (84.7 kg)   SpO2 98%   BMI 28.40 kg/m   GEN: Obese 64 yo in no acute distress  HEENT: normal  Neck: JVP normal  , carotid bruits, or masses Cardiac: RRR; Gr I/VI systolic murmur apex   No LE edema  Respiratory:  clear to auscultation bilaterally,  GI: soft, nontender, nondistended, + BS  No hepatomegaly  MS: no deformity Moving all extremities   Skin: warm and dry, no rash Neuro:  Strength and sensation are intact Psych: euthymic mood, full affect   EKG:  EKG is ordered today.    SB 57  RBBB    Lipid Panel    Component Value Date/Time   CHOL 151 12/13/2017 1147   TRIG 114 12/13/2017 1147   HDL 53 12/13/2017 1147   CHOLHDL 2.8 12/13/2017 1147   CHOLHDL 4 01/19/2010 0904   VLDL 22.4 01/19/2010 0904   LDLCALC 75 12/13/2017 1147   LDLDIRECT 164.1 01/19/2010 0904      Wt Readings from Last 3 Encounters:  11/30/19 186 lb 12.8 oz (84.7 kg)  11/24/18 211 lb (95.7 kg)   12/13/17 207 lb 9.6 oz (94.2 kg)      ASSESSMENT AND PLAN:  1  Mitral valve dz   Hx endocarditis and prolapse   Last echo in 2019  Will repeat to reeval MR     2  Hx dizziness  Pt asymptomatic   BP is OK  3 HLLast LDL in Feb 2021 was 89  HDL 55  Follow   Keep on Crestor   4  Renal  Cr 1.1 on last check   Follow   Encouraged him to keep active  Watch fats, sweets    F/U next fall     Current medicines are reviewed at length with the patient today.  The patient does not have concerns regarding medicines.  Signed, Dorris Carnes, MD  11/30/2019 12:52 PM    [address], [place], [place]  [address] Phone: [phone]; Fax: [phone]

## 2019-11-30 ENCOUNTER — Encounter: Payer: Self-pay | Admitting: Internal Medicine

## 2019-11-30 ENCOUNTER — Other Ambulatory Visit: Payer: Self-pay

## 2019-11-30 ENCOUNTER — Ambulatory Visit (INDEPENDENT_AMBULATORY_CARE_PROVIDER_SITE_OTHER): Payer: BC Managed Care – PPO | Admitting: Internal Medicine

## 2019-11-30 VITALS — BP 122/78 | HR 57 | Ht 68.0 in | Wt 186.8 lb

## 2019-11-30 DIAGNOSIS — I340 Nonrheumatic mitral (valve) insufficiency: Secondary | ICD-10-CM

## 2019-11-30 NOTE — Patient Instructions (Signed)
Medication Instructions:  No changes *If you need a refill on your cardiac medications before your next appointment, please call your pharmacy*   Lab Work: none If you have labs (blood work) drawn today and your tests are completely normal, you will receive your results only by: Marland Kitchen MyChart Message (if you have MyChart) OR . A paper copy in the mail If you have any lab test that is abnormal or we need to change your treatment, we will call you to review the results.   Testing/Procedures: Your physician has requested that you have an echocardiogram. Echocardiography is a painless test that uses sound waves to create images of your heart. It provides your doctor with information about the size and shape of your heart and how well your heart's chambers and valves are working. This procedure takes approximately one hour. There are no restrictions for this procedure.   Follow-Up: At [facility], you and your health needs are our priority.  As part of our continuing mission to provide you with exceptional heart care, we have created designated Provider Care Teams.  These Care Teams include your primary Cardiologist (physician) and Advanced Practice Providers (APPs -  Physician Assistants and Nurse Practitioners) who all work together to provide you with the care you need, when you need it.   Your next appointment:   11 month(s)  The format for your next appointment:   In Person  Provider:   You may see Dorris Carnes, MD or one of the following Advanced Practice Providers on your designated Care Team:    Richardson Dopp, PA-C  Robbie Lis, [place]   Other Instructions

## 2019-12-03 DIAGNOSIS — F3342 Major depressive disorder, recurrent, in full remission: Secondary | ICD-10-CM | POA: Diagnosis not present

## 2019-12-03 DIAGNOSIS — G4700 Insomnia, unspecified: Secondary | ICD-10-CM | POA: Diagnosis not present

## 2019-12-09 DIAGNOSIS — Z905 Acquired absence of kidney: Secondary | ICD-10-CM | POA: Diagnosis not present

## 2019-12-09 DIAGNOSIS — D631 Anemia in chronic kidney disease: Secondary | ICD-10-CM | POA: Diagnosis not present

## 2019-12-09 DIAGNOSIS — N1831 Chronic kidney disease, stage 3a: Secondary | ICD-10-CM | POA: Diagnosis not present

## 2019-12-09 DIAGNOSIS — I129 Hypertensive chronic kidney disease with stage 1 through stage 4 chronic kidney disease, or unspecified chronic kidney disease: Secondary | ICD-10-CM | POA: Diagnosis not present

## 2019-12-24 ENCOUNTER — Other Ambulatory Visit: Payer: Self-pay

## 2019-12-24 ENCOUNTER — Ambulatory Visit (HOSPITAL_COMMUNITY): Payer: BC Managed Care – PPO | Attending: Cardiology

## 2019-12-24 DIAGNOSIS — I340 Nonrheumatic mitral (valve) insufficiency: Secondary | ICD-10-CM

## 2019-12-24 LAB — ECHOCARDIOGRAM COMPLETE
Area-P 1/2: 3.91 cm2
MV M vel: 4.67 m/s
MV Peak grad: 87.2 mmHg
Radius: 0.75 cm
S' Lateral: 3.5 cm

## 2020-01-20 ENCOUNTER — Telehealth: Payer: Self-pay | Admitting: *Deleted

## 2020-01-20 ENCOUNTER — Encounter: Payer: Self-pay | Admitting: *Deleted

## 2020-01-20 DIAGNOSIS — Z01812 Encounter for preprocedural laboratory examination: Secondary | ICD-10-CM

## 2020-01-20 NOTE — Telephone Encounter (Signed)
Patient has been scheduled for TEE at [facility] on 01/28/20 at 11:30 am, arrive at 10:00 am. Covid screening and labs scheduled for 01/26/20.   Pt confirms no diabetic medications and no blood thinners.    Discussed with patient and forwarded instruction letter to him to My Chart.

## 2020-01-20 NOTE — Telephone Encounter (Signed)
-----   Message from Fay Records, MD sent at 01/12/2020  5:13 PM EST ----- I have reviewed with pt and with C Roxy Manns Would recomm TEE to further define mitral valve  Pt with worsening MR   Pt agreeaable Please see about scheduling   I can do on a day I am not at hospital

## 2020-01-26 ENCOUNTER — Other Ambulatory Visit: Payer: BC Managed Care – PPO | Admitting: *Deleted

## 2020-01-26 ENCOUNTER — Other Ambulatory Visit (HOSPITAL_COMMUNITY)
Admission: RE | Admit: 2020-01-26 | Discharge: 2020-01-26 | Disposition: A | Discharge status: Home / Self Care | Payer: BC Managed Care – PPO | Source: Ambulatory Visit | Attending: Internal Medicine | Admitting: Internal Medicine

## 2020-01-26 ENCOUNTER — Other Ambulatory Visit: Payer: Self-pay

## 2020-01-26 DIAGNOSIS — I080 Rheumatic disorders of both mitral and aortic valves: Secondary | ICD-10-CM | POA: Diagnosis not present

## 2020-01-26 DIAGNOSIS — Z01812 Encounter for preprocedural laboratory examination: Secondary | ICD-10-CM

## 2020-01-26 DIAGNOSIS — Z20822 Contact with and (suspected) exposure to covid-19: Secondary | ICD-10-CM | POA: Insufficient documentation

## 2020-01-26 LAB — BASIC METABOLIC PANEL
BUN/Creatinine Ratio: 16 (ref 10–24)
BUN: 25 mg/dL (ref 8–27)
CO2: 30 mmol/L — ABNORMAL HIGH (ref 20–29)
Calcium: 9.7 mg/dL (ref 8.6–10.2)
Chloride: 106 mmol/L (ref 96–106)
Creatinine, Ser: 1.55 mg/dL — ABNORMAL HIGH (ref 0.76–1.27)
GFR calc Af Amer: 54 mL/min/{1.73_m2} — ABNORMAL LOW (ref >59)
GFR calc non Af Amer: 47 mL/min/{1.73_m2} — ABNORMAL LOW (ref >59)
Glucose: 103 mg/dL — ABNORMAL HIGH (ref 65–99)
Potassium: 5 mmol/L (ref 3.5–5.2)
Sodium: 142 mmol/L (ref 134–144)

## 2020-01-26 LAB — SARS CORONAVIRUS 2 (TAT 6-24 HRS): SARS Coronavirus 2: NEGATIVE

## 2020-01-26 LAB — CBC
Hematocrit: 43.2 % (ref 37.5–51.0)
Hemoglobin: 14.7 g/dL (ref 13.0–17.7)
MCH: 30.5 pg (ref 26.6–33.0)
MCHC: 34 g/dL (ref 31.5–35.7)
MCV: 90 fL (ref 79–97)
Platelets: 280 10*3/uL (ref 150–450)
RBC: 4.82 x10E6/uL (ref 4.14–5.80)
RDW: 13.5 % (ref 11.6–15.4)
WBC: 5.8 10*3/uL (ref 3.4–10.8)

## 2020-01-28 ENCOUNTER — Encounter (HOSPITAL_COMMUNITY)
Admission: RE | Disposition: A | Discharge status: Home / Self Care | Payer: Self-pay | Source: Home / Self Care | Attending: Internal Medicine

## 2020-01-28 ENCOUNTER — Ambulatory Visit (HOSPITAL_COMMUNITY)
Admission: RE | Admit: 2020-01-28 | Discharge: 2020-01-28 | Disposition: A | Discharge status: Home / Self Care | Payer: BC Managed Care – PPO | Attending: Internal Medicine | Admitting: Internal Medicine

## 2020-01-28 ENCOUNTER — Encounter (HOSPITAL_COMMUNITY): Payer: Self-pay | Admitting: Internal Medicine

## 2020-01-28 ENCOUNTER — Other Ambulatory Visit: Payer: Self-pay

## 2020-01-28 ENCOUNTER — Ambulatory Visit (HOSPITAL_BASED_OUTPATIENT_CLINIC_OR_DEPARTMENT_OTHER): Payer: BC Managed Care – PPO

## 2020-01-28 ENCOUNTER — Ambulatory Visit (HOSPITAL_COMMUNITY): Payer: BC Managed Care – PPO | Admitting: Certified Registered Nurse Anesthetist

## 2020-01-28 DIAGNOSIS — I340 Nonrheumatic mitral (valve) insufficiency: Secondary | ICD-10-CM | POA: Diagnosis not present

## 2020-01-28 DIAGNOSIS — Z20822 Contact with and (suspected) exposure to covid-19: Secondary | ICD-10-CM | POA: Diagnosis not present

## 2020-01-28 DIAGNOSIS — I252 Old myocardial infarction: Secondary | ICD-10-CM | POA: Diagnosis not present

## 2020-01-28 DIAGNOSIS — I10 Essential (primary) hypertension: Secondary | ICD-10-CM | POA: Diagnosis not present

## 2020-01-28 DIAGNOSIS — I080 Rheumatic disorders of both mitral and aortic valves: Secondary | ICD-10-CM | POA: Diagnosis not present

## 2020-01-28 DIAGNOSIS — E7849 Other hyperlipidemia: Secondary | ICD-10-CM | POA: Diagnosis not present

## 2020-01-28 HISTORY — PX: TEE WITHOUT CARDIOVERSION: SHX5443

## 2020-01-28 LAB — ECHO TEE
MV M vel: 4.73 m/s
MV Peak grad: 89.5 mmHg

## 2020-01-28 SURGERY — ECHOCARDIOGRAM, TRANSESOPHAGEAL
Anesthesia: Monitor Anesthesia Care

## 2020-01-28 MED ORDER — PROPOFOL 10 MG/ML IV BOLUS
INTRAVENOUS | Status: DC | PRN
  Administered 2020-01-28: 30 mg via INTRAVENOUS
  Administered 2020-01-28: 20 mg via INTRAVENOUS

## 2020-01-28 MED ORDER — LACTATED RINGERS IV SOLN: INTRAVENOUS | Status: DC | PRN

## 2020-01-28 MED ORDER — PROPOFOL 500 MG/50ML IV EMUL
INTRAVENOUS | Status: DC | PRN
  Administered 2020-01-28: 75 ug/kg/min via INTRAVENOUS

## 2020-01-28 MED ORDER — LIDOCAINE 2% (20 MG/ML) 5 ML SYRINGE
INTRAMUSCULAR | Status: DC | PRN
  Administered 2020-01-28: 60 mg via INTRAVENOUS

## 2020-01-28 NOTE — H&P (Signed)
Cardiology Office Note   Date:  01/28/2020   ID:  [name], DOB [birth year], MRN [id number]  PCP:  Josetta Huddle, MD  Cardiologist:   Dorris Carnes, MD    F/U of mitral valve dz       History of Present Illness: [name] is a 65 y.o. 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 with resultant CVA and  then bleed    TEE was also  done showing thickened MV but no vegetation  Mild to mod MR  Prolapse noted   In 2017 the patient was admitted for sepsis   He was incidentally found to have a renal mass  and renal cell CA  Underwent nephrectomy in Feb 2018 He was seen in ED on 06/16/17   He got up to go to bathroom  Urinated   On getting up dizzy   Went to floor  Broke nose       I saw the pt in Nov 2021  In clinic  The pt went on to have a follow up echocardiogram   Findings noted below   I have reviewed images/case with [place] for TEE to further evaluate MV.   Risks/benefits explained.  Pt understands and agrees to proceed  Echo:  12/24/19  1. Left ventricular ejection fraction, by estimation, is 55 to 60%. The left ventricle has normal function. The left ventricle has no regional wall motion abnormalities. Left ventricular diastolic parameters are consistent with Grade I diastolic dysfunction (impaired relaxation). Elevated left ventricular end-diastolic pressure. 2. Right ventricular systolic function is normal. The right ventricular size is normal. 3. Left atrial size was severely dilated. 4. The mitral valve is abnormal. Moderate to probably severe mitral valve regurgitation. There is moderate late systolic prolapse of multiple scallops of the posterior leaflet of the mitral valve. The regurgitant jet hugs the atrial septum. 5. The aortic valve is tricuspid. Aortic valve regurgitation is mild and is directed toward the anterior mitral leaflet. 6. Aortic dilatation noted. There is mild dilatation of the  aortic root, measuring 40 mm. Comparison(s): Changes from prior study are noted. 10/15/2017: 55-60%, mild AI, posterior mitral leaflet prolapse with mild MR, moderate LAE.   (Not in an outpatient encounter)    Allergies:   Patient has no known allergies.   Past Medical History:  Diagnosis Date  . Bacteremia 110/2017  . Cancer ([place])    renal mass at preop  . CVA (cerebral infarction)    hemmorhagic  CVA  . Depression   . Dyslipidemia   . Endocarditis    MV Vegitation  . History of kidney stones   . Hypertension   . Mitral regurgitation   . Myocardial infarction ([place])   . Stroke [facility])     Past Surgical History:  Procedure Laterality Date  . NO PAST SURGERIES    . ROBOT ASSISTED LAPAROSCOPIC NEPHRECTOMY Right 02/22/2016   Procedure: XI ROBOTIC ASSISTED LAPAROSCOPIC NEPHRECTOMY;  Surgeon: Alexis Frock, MD;  Location: [facility];  Service: Urology;  Laterality: Right;  . TONSILLECTOMY       Social History:  The patient  reports that he has never smoked. He has never used smokeless tobacco. He reports current alcohol use of about 7.0 standard drinks of alcohol per week. He reports current drug use. Drug: Marijuana.   Family History:  The patient's family history includes Heart disease in his mother.    ROS:  Please see the history of present illness. All other systems are reviewed and  Negative to the above problem except as noted.    PHYSICAL EXAM: VS:  BP 139/75   Pulse 70   Resp 14   SpO2 94%   GEN: Pt in NAD  HEENT: normal  Neck: JVP normal  , carotid bruits, or masses Cardiac: RRR; Gr I/VI systolic murmur apex   No LE edema  Respiratory:  clear to auscultation bilaterally,  GI: soft, nontender, nondistended, + BS  No hepatomegaly  MS: no deformity Moving all extremities   Skin: warm and dry, no rash Neuro:  Strength and sensation are intact Psych: euthymic mood, full affect   EKG:  EKG is not ordered today.    Lipid Panel    Component Value Date/Time    CHOL 151 12/13/2017 1147   TRIG 114 12/13/2017 1147   HDL 53 12/13/2017 1147   CHOLHDL 2.8 12/13/2017 1147   CHOLHDL 4 01/19/2010 0904   VLDL 22.4 01/19/2010 0904   LDLCALC 75 12/13/2017 1147   LDLDIRECT 164.1 01/19/2010 0904      Wt Readings from Last 3 Encounters:  11/30/19 84.7 kg  11/24/18 95.7 kg  12/13/17 94.2 kg      ASSESSMENT AND PLAN:  1  Mitral valve dz   Hx endocarditis and prolapse   REcent echo noted above    Plan for TEE today to further evaluate Mitral valve .  Risks / benefits explained  Pt understands and agrees to proceed.    Current medicines are reviewed at length with the patient today.  The patient does not have concerns regarding medicines.  Signed, Dorris Carnes, MD  01/28/2020 10:43 AM    [address], [place], [place]  [address] Phone: [phone]; Fax: [phone]

## 2020-01-28 NOTE — Anesthesia Postprocedure Evaluation (Signed)
Anesthesia Post Note  Patient: Zacharee Gaddie  Procedure(s) Performed: TRANSESOPHAGEAL ECHOCARDIOGRAM (TEE) (N/A )     Patient location during evaluation: PACU Anesthesia Type: MAC Level of consciousness: awake and alert Pain management: pain level controlled Vital Signs Assessment: post-procedure vital signs reviewed and stable Respiratory status: spontaneous breathing, nonlabored ventilation and respiratory function stable Cardiovascular status: stable and blood pressure returned to baseline Postop Assessment: no apparent nausea or vomiting Anesthetic complications: no   No complications documented.  Last Vitals:  Vitals:   01/28/20 1153 01/28/20 1203  BP: 107/63 (!) 110/58  Pulse: 63 65  Resp: 16 13  Temp:    SpO2: 96% 98%    Last Pain:  Vitals:   01/28/20 1203  TempSrc:   PainSc: 0-No pain                 Mckenzie Toruno,W. EDMOND

## 2020-01-28 NOTE — Anesthesia Preprocedure Evaluation (Addendum)
Anesthesia Evaluation  Patient identified by MRN, date of birth, ID band Patient awake    Reviewed: Allergy & Precautions, H&P , NPO status , Patient's Chart, lab work & pertinent test results  Airway Mallampati: II  TM Distance: >3 FB Neck ROM: Full    Dental no notable dental hx. (+) Teeth Intact, Dental Advisory Given   Pulmonary neg pulmonary ROS,    Pulmonary exam normal breath sounds clear to auscultation       Cardiovascular hypertension, Pt. on medications + Past MI  + Valvular Problems/Murmurs MR  Rhythm:Regular Rate:Normal     Neuro/Psych Depression CVA, Residual Symptoms    GI/Hepatic negative GI ROS, Neg liver ROS,   Endo/Other  negative endocrine ROS  Renal/GU negative Renal ROS  negative genitourinary   Musculoskeletal   Abdominal   Peds  Hematology negative hematology ROS (+)   Anesthesia Other Findings   Reproductive/Obstetrics negative OB ROS                            Anesthesia Physical Anesthesia Plan  ASA: III  Anesthesia Plan: MAC   Post-op Pain Management:    Induction: Intravenous  PONV Risk Score and Plan: 1 and Propofol infusion  Airway Management Planned: Nasal Cannula  Additional Equipment:   Intra-op Plan:   Post-operative Plan:   Informed Consent: I have reviewed the patients History and Physical, chart, labs and discussed the procedure including the risks, benefits and alternatives for the proposed anesthesia with the patient or authorized representative who has indicated his/her understanding and acceptance.     Dental advisory given  Plan Discussed with: CRNA  Anesthesia Plan Comments:         Anesthesia Quick Evaluation

## 2020-01-28 NOTE — Discharge Instructions (Signed)
Transesophageal Echocardiogram Transesophageal echocardiogram (TEE) is a test that uses sound waves to take pictures of your heart. TEE is done by passing a small probe attached to a flexible tube down the part of the body that moves food from your mouth to your stomach (esophagus). The pictures give clear images of your heart. This can help your doctor see if there are problems with your heart. Tell a doctor about:  Any allergies you have.  All medicines you are taking. This includes vitamins, herbs, eye drops, creams, and over-the-counter medicines.  Any problems you or family members have had with anesthetic medicines.  Any blood disorders you have.  Any surgeries you have had.  Any medical conditions you have.  Any swallowing problems.  Whether you have or have had a blockage in the part of the body that moves food from your mouth to your stomach.  Whether you are pregnant or may be pregnant. What are the risks? In general, this is a safe procedure. But, problems may occur, such as:  Damage to nearby structures or organs.  A tear in the part of the body that moves food from your mouth to your stomach.  Irregular heartbeat.  Hoarse voice or trouble swallowing.  Bleeding. What happens before the procedure? Medicines  Ask your doctor about changing or stopping: ? Your normal medicines. ? Vitamins, herbs, and supplements. ? Over-the-counter medicines.  Do not take aspirin or ibuprofen unless you are told to. General instructions  Follow instructions from your doctor about what you cannot eat or drink.  You will take out any dentures or dental retainers.  Plan to have a responsible adult take you home from the hospital or clinic.  Plan to have a responsible adult care for you for the time you are told after you leave the hospital or clinic. This is important. What happens during the procedure?  An IV will be put into one of your veins.  You may be given: ? A  sedative. This medicine helps you relax. ? A medicine to numb the back of your throat. This may be sprayed or gargled.  Your blood pressure, heart rate, and breathing will be watched.  You may be asked to lie on your left side.  A bite block will be placed in your mouth. This keeps you from biting the tube.  The tip of the probe will be placed into the back of your mouth.  You will be asked to swallow.  Your doctor will take pictures of your heart.  The probe and bite block will be taken out after the test is done. The procedure may vary among doctors and hospitals.   What can I expect after the procedure?  You will be monitored until you leave the hospital or clinic. This includes checking your blood pressure, heart rate, breathing rate, and blood oxygen level.  Your throat may feel sore and numb. This will get better over time. You will not be allowed to eat or drink until the numbness has gone away.  It is common to have a sore throat for a day or two.  It is up to you to get the results of your procedure. Ask how to get your results when they are ready. Follow these instructions at home:  If you were given a sedative during your procedure, do not drive or use machines until your doctor says that it is safe.  Return to your normal activities when your doctor says that it is safe.    Keep all follow-up visits. Summary  TEE is a test that uses sound waves to take pictures of your heart.  You will be given a medicine to help you relax.  Do not drive or use machines until your doctor says that it is safe. This information is not intended to replace advice given to you by your health care provider. Make sure you discuss any questions you have with your health care provider. Document Revised: 08/11/2019 Document Reviewed: 08/11/2019 Elsevier Patient Education  2021 Elsevier Inc.  

## 2020-01-28 NOTE — Transfer of Care (Signed)
Immediate Anesthesia Transfer of Care Note  Patient: [name]  Procedure(s) Performed: TRANSESOPHAGEAL ECHOCARDIOGRAM (TEE) (N/A )  Patient Location: PACU and Endoscopy Unit  Anesthesia Type:MAC  Level of Consciousness: drowsy  Airway & Oxygen Therapy: Patient Spontanous Breathing and Patient connected to nasal cannula oxygen  Post-op Assessment: Report given to RN and Post -op Vital signs reviewed and stable  Post vital signs: Reviewed and stable  Last Vitals:  Vitals Value Taken Time  BP 102/54 01/28/20 1143  Temp    Pulse 73 01/28/20 1143  Resp 15 01/28/20 1143  SpO2 99 % 01/28/20 1143  Vitals shown include unvalidated device data.  Last Pain:  Vitals:   01/28/20 1048  TempSrc: Oral  PainSc: 0-No pain         Complications: No complications documented.

## 2020-01-28 NOTE — Anesthesia Procedure Notes (Signed)
Procedure Name: MAC Date/Time: 01/28/2020 11:00 AM Performed by: Trinna Post., CRNA Pre-anesthesia Checklist: Patient identified, Emergency Drugs available, Suction available, Patient being monitored and Timeout performed Patient Re-evaluated:Patient Re-evaluated prior to induction Oxygen Delivery Method: Nasal cannula Preoxygenation: Pre-oxygenation with 100% oxygen Induction Type: IV induction Placement Confirmation: positive ETCO2

## 2020-01-28 NOTE — Progress Notes (Signed)
  Echocardiogram 2D Echocardiogram has been performed.  Jennette Dubin 01/28/2020, 1:01 PM

## 2020-01-28 NOTE — CV Procedure (Signed)
Transesophageal echo  Throat anesthetized with lidocaine.   Bite guard placed Pt sedated by anesthesia with Propofol intravenously    TEE probe advanced to mid esophagus without difficulty   LA, LAA without masses  MV is thickened.  There is a partially flail posterior leaflet (P3) with large jet of MR directed anteriorly  Occurs at junction of P2/P3    There are 2 other jets of MR as well which are less prominent Turbulent flow seen around to back of LA to orifice of R upper pulmonary vein  TV is normal  Trace TR  AV is minimally thickened with mild AI (jet directed anteriorly)  PV is normal  Mild PI  LVEF normal  RVEF normal  Mild fixed plaque in thoracic aorta.   Procedure was without complication  Full report to follow   Dorris Carnes MD

## 2020-01-29 ENCOUNTER — Encounter (HOSPITAL_COMMUNITY): Payer: Self-pay | Admitting: Internal Medicine

## 2020-02-02 ENCOUNTER — Other Ambulatory Visit: Payer: Self-pay | Admitting: *Deleted

## 2020-02-02 DIAGNOSIS — I340 Nonrheumatic mitral (valve) insufficiency: Secondary | ICD-10-CM

## 2020-02-03 DIAGNOSIS — Z03818 Encounter for observation for suspected exposure to other biological agents ruled out: Secondary | ICD-10-CM | POA: Diagnosis not present

## 2020-02-05 DIAGNOSIS — D225 Melanocytic nevi of trunk: Secondary | ICD-10-CM | POA: Diagnosis not present

## 2020-02-05 DIAGNOSIS — D2262 Melanocytic nevi of left upper limb, including shoulder: Secondary | ICD-10-CM | POA: Diagnosis not present

## 2020-02-05 DIAGNOSIS — D2271 Melanocytic nevi of right lower limb, including hip: Secondary | ICD-10-CM | POA: Diagnosis not present

## 2020-02-05 DIAGNOSIS — D2261 Melanocytic nevi of right upper limb, including shoulder: Secondary | ICD-10-CM | POA: Diagnosis not present

## 2020-02-22 ENCOUNTER — Institutional Professional Consult (permissible substitution) (INDEPENDENT_AMBULATORY_CARE_PROVIDER_SITE_OTHER): Payer: BC Managed Care – PPO | Admitting: Thoracic Surgery (Cardiothoracic Vascular Surgery)

## 2020-02-22 ENCOUNTER — Encounter: Payer: Self-pay | Admitting: Thoracic Surgery (Cardiothoracic Vascular Surgery)

## 2020-02-22 ENCOUNTER — Other Ambulatory Visit: Payer: Self-pay

## 2020-02-22 VITALS — BP 124/75 | HR 72 | Resp 20 | Ht 68.0 in | Wt 182.0 lb

## 2020-02-22 DIAGNOSIS — I341 Nonrheumatic mitral (valve) prolapse: Secondary | ICD-10-CM

## 2020-02-22 DIAGNOSIS — I340 Nonrheumatic mitral (valve) insufficiency: Secondary | ICD-10-CM | POA: Diagnosis not present

## 2020-02-22 NOTE — Patient Instructions (Addendum)

## 2020-02-22 NOTE — Progress Notes (Signed)
[address]       [place] [address]             [place] REPORT  Referring Provider is Fay Records, MD PCP is Josetta Huddle, MD  Chief Complaint  Patient presents with  . 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  Patient has known of presence of a [name] murmur for many years.  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  He did well until 2017 when he developed group B strep sepsis complicated by meningitis which developed approximately 48 hours after the patient underwent an elective urologic procedure.  TEE performed at that time did not reveal any clear evidence of vegetation on the mitral valve.  He has been followed intermittently by [name] and recent follow-up transthoracic echocardiogram performed December 24, 2019 revealed signs of significant progression and severity of mitral regurgitation with preserved left ventricular systolic function.  Follow-up TEE was performed January 28, 2020 and confirmed the presence of severe mitral regurgitation with a partially flail portion of the P2 and P3 segments of the posterior leaflet of the mitral valve.  Left ventricular size and systolic function remain normal with ejection fraction estimated 60 to 65%.  There was mild left atrial enlargement.  Cardiothoracic surgical consultation was requested.  Patient is married and lives locally in [place]  with his wife.  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  He has remained reasonably active physically and reports no significant physical limitations.  He walks his dog daily and exercises using a Peloton several times a week.  He has not experienced any change in his exercise tolerance.  He specifically denies any symptoms of exertional shortness of breath or chest discomfort.  He has never had any resting shortness of breath, PND, orthopnea, nor lower extremity edema.  He denies any history of palpitations, dizzy spells, or syncope.  He still has mild residual neurologic impairment related to his previous stroke characterized by some problems with specific fine motor skills particularly afflicting his left hand.  He is reported to have mild numbness along the left side of his face and left facial droop.  Past Medical History:  Diagnosis Date  . Bacteremia 110/2017  . Cancer ([place])    renal mass at preop  . CVA (cerebral infarction)    hemmorhagic  CVA  . Depression   . Dyslipidemia   . Endocarditis    MV Vegitation  . History of kidney stones   . Hypertension   . Mitral regurgitation   . Myocardial infarction ([place])   . Stroke [name])     Past Surgical History:  Procedure Laterality Date  . NO PAST SURGERIES    . ROBOT ASSISTED LAPAROSCOPIC NEPHRECTOMY Right 02/22/2016   Procedure: XI ROBOTIC ASSISTED LAPAROSCOPIC NEPHRECTOMY;  Surgeon: Alexis Frock, MD;  Location: [facility] OR[facility];  Service: Urology;  Laterality: Right;  . TEE WITHOUT CARDIOVERSION N/A 01/28/2020   Procedure: TRANSESOPHAGEAL  ECHOCARDIOGRAM (TEE);  Surgeon: Fay Records, MD;  Location: [facility] ENDOSCOPY;  Service: Cardiovascular;  Laterality: N/A;  . TONSILLECTOMY      Family History  Problem Relation Age of Onset  . [name] disease Mother        mom RF as a child (died of [name] problem in  29 )    Social History   Socioeconomic History  . Marital status: Married     Spouse name: Not on file  . Number of children: Not on file  . Years of education: Not on file  . Highest education level: Not on file  Occupational History  . Not on file  Tobacco Use  . Smoking status: Never Smoker  . Smokeless tobacco: Never Used  Vaping Use  . Vaping Use: Never used  Substance and Sexual Activity  . Alcohol use: Yes    Alcohol/week: 7.0 standard drinks    Types: 7 Glasses of wine per week  . Drug use: Yes    Types: Marijuana  . Sexual activity: Yes  Other Topics Concern  . 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 amLODipine (NORVASC) 5 MG tablet Take 5 mg by mouth daily.    Marland Kitchen buPROPion (WELLBUTRIN XL) 150 MG 24 hr tablet Take 150 mg by mouth daily.    . Cholecalciferol (VITAMIN D3) 2000 units TABS Take 4,000 Units by mouth daily.    . diphenhydramine-acetaminophen (TYLENOL PM) 25-500 MG TABS tablet Take 2 tablets by mouth at bedtime.    . finasteride (PROSCAR) 5 MG tablet Take 1 tablet (5 mg total) by mouth daily. 30 tablet 0  . losartan (COZAAR) 100 MG tablet Take 100 mg by mouth daily.    . Melatonin 10 MG TABS Take 10 mg by mouth at bedtime.     . Omega-3 Fatty Acids (FISH OIL PO) Take 400 mg by mouth daily.    . Probiotic Product (PROBIOTIC DAILY PO) Take 1 tablet by mouth daily at 12 noon.    . rosuvastatin (CRESTOR) 10 MG tablet TAKE 1 TABLET ONCE DAILY. (Patient taking differently: Take 10 mg by mouth daily.) 90 tablet 3   No current facility-administered medications for this visit.    No Known Allergies    Review of  Systems:   General:  normal appetite, normal energy, no weight gain, no weight loss, no fever  Cardiac:  no chest pain with exertion, no chest pain at rest, no SOB with exertion, no resting SOB, no PND, no orthopnea, no palpitations, no arrhythmia, no atrial fibrillation, no LE edema, no dizzy spells, no syncope  Respiratory:  no shortness of breath, no home oxygen, no productive cough, no dry cough, no bronchitis, no wheezing, no hemoptysis, no asthma, no pain with inspiration or cough, no sleep apnea, no CPAP at night  GI:   no difficulty swallowing, no reflux, no frequent heartburn, no hiatal hernia, no abdominal pain, no constipation, no diarrhea, no hematochezia, no hematemesis, no melena  GU:   no dysuria,  + frequency, no urinary  tract infection, no hematuria, no enlarged prostate, no kidney stones, no kidney disease  Vascular:  no pain suggestive of claudication, no pain in feet, no leg cramps, no varicose veins, no DVT, no non-healing foot ulcer  Neuro:   + stroke, no TIA's, no seizures, no headaches, no temporary blindness one eye,  no slurred speech, no peripheral neuropathy, no chronic pain, no instability of gait, no memory/cognitive dysfunction  Musculoskeletal: no arthritis, no joint swelling, no myalgias, no difficulty walking, normal mobility   Skin:   no rash, no itching, no skin infections, no pressure sores or ulcerations  Psych:   no anxiety, + depression, no nervousness, no unusual recent stress  Eyes:   no blurry vision, no floaters, no recent vision changes, + wears glasses or contacts  ENT:   no hearing loss, no loose or painful teeth, no dentures, last saw dentist 10/2019 - gets routine antibiotic prophylaxis  Hematologic:  no easy bruising, no abnormal bleeding, no clotting disorder, no frequent epistaxis  Endocrine:  no diabetes, does not check CBG's at home     Physical Exam:   BP 124/75 (BP Location: Left Arm, Patient Position: Sitting)   Pulse 72   Resp 20   Ht  5\' 8"  (1.727 m)   Wt 182 lb (82.6 kg)   SpO2 98% Comment: RA  BMI 27.67 kg/m   General:  Thin,  well-appearing  HEENT:  Unremarkable   Neck:   no JVD, no bruits, no adenopathy   Chest:   clear to auscultation, symmetrical breath sounds, no wheezes, no rhonchi   CV:   RRR, grade III/VI holosystolic murmur best at apex  Abdomen:  soft, non-tender, no masses   Extremities:  warm, well-perfused, pulses palpabld, no LE edema  Rectal/GU  Deferred  Neuro:   Grossly non-focal and symmetrical throughout  Skin:   Clean and dry, no rashes, no breakdown   Diagnostic Tests:  ECHOCARDIOGRAM REPORT       Patient Name:  [name] Date of Exam: 12/24/2019  Medical Rec #: [id number]   Height:    68.0 in  Accession #:  [id number]  Weight:    186.8 lb  Date of Birth: 11-23-1955   BSA:     1.985 m  Patient Age:  17 years   BP:      122/78 mmHg  Patient Gender: M       HR:      70 bpm.  Exam Location: [address]   Procedure: 2D Echo, Cardiac Doppler and Color Doppler   Indications:  I05.9 Mitral valve disorder    History:    Patient has prior history of Echocardiogram examinations,  most         recent 10/15/2017. Previous Myocardial Infarction, Stroke,         Mitral Valve Prolapse, Signs/Symptoms:Chest Pain; Risk         Factors:Hypertension and Dyslipidemia. History of  endocarditis.         History of DVT.    Sonographer:  Diamond Nickel RCS  Referring Phys: [id number] PAULA V ROSS   IMPRESSIONS    1. Left ventricular ejection fraction, by estimation, is 55 to 60%. The  left ventricle has normal function. The left ventricle has no regional  wall motion abnormalities. Left ventricular diastolic parameters are  consistent with Grade I diastolic  dysfunction (impaired relaxation). Elevated left ventricular end-diastolic  pressure.  2. Right ventricular systolic function is normal. The right  ventricular  size is normal.  3. Left atrial size was severely dilated.  4. The mitral valve is abnormal. Moderate to probably severe mitral valve  regurgitation. There is moderate late systolic prolapse of multiple  scallops of the posterior leaflet of the mitral valve. The regurgitant jet  hugs the atrial septum.  5. The aortic valve is tricuspid. Aortic valve regurgitation is mild and  is directed toward the anterior mitral leaflet.  6. Aortic dilatation noted. There is mild dilatation of the aortic root,  measuring 40 mm.   Comparison(s): Changes from prior study are noted. 10/15/2017: 55-60%,  mild AI, posterior mitral leaflet prolapse with mild MR, moderate LAE.   Conclusion(s)/Recommendation(s): Findings concerning for severe eccentric  MR, would recommend Transesophageal Echocardiogram for clarification.   FINDINGS  Left Ventricle: Left ventricular ejection fraction, by estimation, is 55  to 60%. The left ventricle has normal function. The left ventricle has no  regional wall motion abnormalities. The left ventricular internal cavity  size was normal in size. There is  borderline left ventricular hypertrophy. Left ventricular diastolic  parameters are consistent with Grade I diastolic dysfunction (impaired  relaxation). Elevated left ventricular end-diastolic pressure.   Right Ventricle: The right ventricular size is normal. No increase in  right ventricular wall thickness. Right ventricular systolic function is  normal.   Left Atrium: Left atrial size was severely dilated.   Right Atrium: Right atrial size was normal in size.   Pericardium: There is no evidence of pericardial effusion.   Mitral Valve: The mitral valve is abnormal. There is moderate late  systolic prolapse of multiple scallops of the posterior leaflet of the  mitral valve. There is moderate thickening of the mitral valve leaflet(s).  Moderate to probably severe mitral valve  regurgitation, with  eccentric anteriorly directed jet.   Tricuspid Valve: The tricuspid valve is grossly normal. Tricuspid valve  regurgitation is mild.   Aortic Valve: The aortic valve is tricuspid. Aortic valve regurgitation is  mild.   Pulmonic Valve: The pulmonic valve was grossly normal. Pulmonic valve  regurgitation is not visualized.   Aorta: Aortic dilatation noted. There is mild dilatation of the aortic  root, measuring 40 mm.   IAS/Shunts: No atrial level shunt detected by color flow Doppler.     LEFT VENTRICLE  PLAX 2D  LVIDd:     4.90 cm Diastology  LVIDs:     3.50 cm LV e' medial:  7.62 cm/s  LV PW:     1.00 cm LV E/e' medial: 13.4  LV IVS:    1.10 cm LV e' lateral:  6.20 cm/s  LVOT diam:   2.45 cm LV E/e' lateral: 16.5  LV SV:     83  LV SV Index:  42  LVOT Area:   4.71 cm     RIGHT VENTRICLE  RV Basal diam: 3.10 cm  RV S prime:   8.59 cm/s  TAPSE (M-mode): 2.1 cm  RVSP:      27.2 mmHg   LEFT ATRIUM       Index    RIGHT ATRIUM      Index  LA diam:    4.20 cm 2.12 cm/m RA Pressure: 3.00 mmHg  LA Vol (A2C):  88.5 ml 44.58 ml/m RA Area:   18.10 cm  LA Vol (A4C):  98.8 ml 49.77 ml/m RA Volume:  44.20 ml 22.26 ml/m  LA Biplane Vol: 94.6 ml 47.65 ml/m  AORTIC VALVE  LVOT Vmax:  94.20 cm/s  LVOT Vmean: 51.700 cm/s  LVOT VTI:  0.176 m    AORTA  Ao Root diam: 4.00 cm   MITRAL VALVE         TRICUSPID VALVE  MV Area (PHT): 3.91 cm   TR Peak grad:  24.2 mmHg  MV Decel Time: 194 msec   TR Vmax:    246.00 cm/s  MR Peak grad:  87.2 mmHg  Estimated RAP: 3.00 mmHg  MR Mean grad:  55.0 mmHg  RVSP:      27.2 mmHg  MR Vmax:     467.00 cm/s  MR Vmean:    348.5 cm/s SHUNTS  MR PISA:     3.53 cm  Systemic VTI: 0.18 m  MR PISA Eff ROA: 27 mm   Systemic Diam: 2.45 cm  MR PISA Radius: 0.75 cm  MV E velocity: 102.00 cm/s  MV A velocity: 118.00 cm/s  MV E/A  ratio: 0.86   Lyman Bishop MD  Electronically signed by Lyman Bishop MD  Signature Date/Time: 12/24/2019/11:04:36 AM       TRANSESOPHOGEAL ECHO REPORT    Patient Name:  [name] Date of Exam: 01/28/2020  Medical Rec #: [id number]   Height:    68.0 in  Accession #:  [id number]  Weight:    180.0 lb  Date of Birth: [birth year]   BSA:     1.954 m  Patient Age:  48 years   BP:      110/58 mmHg  Patient Gender: M       HR:      67 bpm.  Exam Location: Inpatient   Procedure: 2D Echo   Indications:  Mitral Valve Disorder    History:    Patient has prior history of Echocardiogram examinations,  most         recent 12/24/2019. Previous Myocardial Infarction; Risk         Factors:Hypertension and Dyslipidemia.    Sonographer:  Mikki Santee RDCS (AE)  Referring Phys: [id number] PAULA V ROSS   PROCEDURE: The transesophogeal probe was passed without difficulty through  the esophogus of the patient. Sedation performed by different physician.  The patient was monitored while under deep sedation. Anesthestetic  sedation was provided intravenously by  Anesthesiology: 305mg  of Propofol, 60mg  of Lidocaine. The patient  developed no complications during the procedure.   IMPRESSIONS    1. Left ventricular ejection fraction, by estimation, is 60 to 65%. The  left ventricle has normal function. The left ventricle has no regional  wall motion abnormalities.  2. Right ventricular systolic function is normal. The right ventricular  size is normal.  3. Left atrial size was mildly dilated. No left atrial/left atrial  appendage thrombus was detected.  4. The mitral valve is abnormal. Carpentier Type II mitral regurgitation.  The P3 segment of the valve is partially flail, with prolapse of P2/P3.  The mitral regurgitation is severe with a few jets, the most prominent  being anteriorly direct, coursing to  the back  of the left atrium  5. The aortic valve is tricuspid. Aortic valve regurgitation is mild. It  starts central and is posteriorly directed.  6. Mild fixed atherosclerotic plaquing in thoracic aorta.   FINDINGS  Left Ventricle: Left ventricular ejection fraction, by estimation, is 60  to 65%. The left ventricle has normal function. The left ventricle has no  regional wall motion abnormalities. The left ventricular internal cavity  size was normal in size.   Right Ventricle: The right ventricular size is normal. Right vetricular  wall thickness was not  assessed. Right ventricular systolic function is  normal.   Left Atrium: Left atrial size was mildly dilated. No left atrial/left  atrial appendage thrombus was detected.   Right Atrium: Right atrial size was normal in size.   Pericardium: There is no evidence of pericardial effusion.   Mitral Valve: The mitral valve is abnormal. Carpentier Type II mitral  regurgitation. THe P3 segment of the valve is partially flail with  prolapse of P2/P3. The mitral valve regurgitation is severe with a few  jets of MR, the most prominent being  anteriorly directed, coursing to the back of the left atrium .   Tricuspid Valve: The tricuspid valve is normal in structure. Tricuspid  valve regurgitation is trivial.   Aortic Valve: The aortic valve is tricuspid. Aortic valve regurgitation is  mild. It starts central and is posteriorly directed.   Pulmonic Valve: The pulmonic valve was normal in structure. Pulmonic valve  regurgitation is mild.   Aorta: Mild fixed atherosclerotic plaquing in thoracic aorta. The aortic  root is normal in size and structure.   IAS/Shunts: No atrial level shunt detected by color flow Doppler.     MR Peak grad: 89.5 mmHg  MR Mean grad: 60.0 mmHg  MR Vmax:   473.00 cm/s  MR Vmean:   361.0 cm/s   Dorris Carnes MD  Electronically signed by Dorris Carnes MD  Signature Date/Time: 01/28/2020/3:13:22 PM      Impression:  Patient has mitral valve prolapse with stage C severe asymptomatic primary mitral regurgitation.  He remains reasonably active physically and specifically denies any symptoms of exertional shortness of breath or chest discomfort.  He has not had any palpitations, dizzy spells, nor other symptoms to suggest any history of atrial arrhythmias.  I have personally reviewed the patient's recent transthoracic and transesophageal echocardiograms.  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  There is some billowing and prolapse of the remainder of the P2 and P3 segments.  There does not appear to be any significant degree of calcification and overall leaflet mobility appears fairly good.  There is mild left atrial enlargement with normal left ventricular size and systolic function.  There are multiple jets of regurgitation and the severity of mitral regurgitation appeared to be difficult to quantify but in some views there is clear evidence of an eccentric jet of regurgitation that courses around most of the left atrium.  Options include continued close observation with frequent follow-up imaging versus elective mitral valve repair.  Based upon review of the patient's medical history and recent TEE I feel that there is greater than 95% likelihood that his valve could be repaired successfully with anticipated risk of mortality or significant morbidity less than 1%.   Plan:  The patient and his wife were counseled at length regarding his diagnosis of severe primary mitral regurgitation.  We reviewed the results of their diagnostic tests including images from the most recent echocardiogram.  We discussed the natural history of mitral regurgitation as well as alternative treatment strategies.  We discussed the impact of  age,  current state of health, and any significant comorbid medical problems on clinical decision making.  We went on to discuss the indications, risks and potential benefits of mitral valve repair as well as the timing of surgical intervention.  The rationale for elective [name] has been explained, including a comparison  between [name] and continued medical therapy with close follow-up.  The likelihood of successful and durable mitral valve repair has been discussed with particular reference to the findings of the most recent echocardiogram.  Based upon these findings and previous experience, I have quoted a greater than 95 percent likelihood of successful valve repair with less than 1 percent risk of mortality or major morbidity.  Alternative surgical approaches have been discussed including a comparison between conventional sternotomy and minimally-invasive techniques.  The relative risks and benefits of each have been reviewed as they pertain to the patient's specific circumstances, and expectations for the patient's postoperative convalescence has been discussed.  The patient is interested in proceeding with elective mitral valve repair at some point in the near future although he desires to discuss the timing of [name] further with his wife particularly with regards to some trips they had planned for later this spring and early next summer.  The patient and his wife both understand that as the next step he will need to undergo cardiac catheterization and CT angiography if elective mitral valve repair is to be performed.  We will hold off on scheduling either procedure until the patient has decided when he is interested in proceeding with surgical intervention.  All of the questions have been addressed.    I spent in excess of 90 minutes during the conduct of this office consultation and >50% of this time involved direct face-to-face encounter with the patient for counseling and/or coordination of their  care.    Valentina Gu. Roxy Manns, MD 02/22/2020 10:37 AM

## 2020-02-23 ENCOUNTER — Other Ambulatory Visit: Payer: Self-pay | Admitting: Thoracic Surgery (Cardiothoracic Vascular Surgery)

## 2020-02-23 DIAGNOSIS — I340 Nonrheumatic mitral (valve) insufficiency: Secondary | ICD-10-CM

## 2020-02-26 ENCOUNTER — Telehealth: Payer: Self-pay

## 2020-02-26 NOTE — Telephone Encounter (Signed)
Per Dr. Roxy Manns, called to speak to patient about arranging L/RHC with Dr. Burt Knack in May 2022. The patient will need an appointment with [name] first for EKG, lab work, and to update H&P.   Left message to call back.

## 2020-02-29 NOTE — Telephone Encounter (Signed)
Left message to call back  

## 2020-03-02 NOTE — Telephone Encounter (Signed)
Left message to call back  

## 2020-03-03 NOTE — Telephone Encounter (Signed)
Spoke with the patient. Scheduled appointment with [name] 05/02/20 for pre-cath visit to include EKG, labs and to get cath instructions.  Scheduled cath 05/11/20 with Dr. Burt Knack.  Message sent to pre-cert.

## 2020-03-09 DIAGNOSIS — I10 Essential (primary) hypertension: Secondary | ICD-10-CM | POA: Diagnosis not present

## 2020-03-09 DIAGNOSIS — Z905 Acquired absence of kidney: Secondary | ICD-10-CM | POA: Diagnosis not present

## 2020-03-09 DIAGNOSIS — E559 Vitamin D deficiency, unspecified: Secondary | ICD-10-CM | POA: Diagnosis not present

## 2020-03-09 DIAGNOSIS — Z13220 Encounter for screening for lipoid disorders: Secondary | ICD-10-CM | POA: Diagnosis not present

## 2020-03-09 DIAGNOSIS — Z0000 Encounter for general adult medical examination without abnormal findings: Secondary | ICD-10-CM | POA: Diagnosis not present

## 2020-03-09 DIAGNOSIS — I059 Rheumatic mitral valve disease, unspecified: Secondary | ICD-10-CM | POA: Diagnosis not present

## 2020-03-09 DIAGNOSIS — F09 Unspecified mental disorder due to known physiological condition: Secondary | ICD-10-CM | POA: Diagnosis not present

## 2020-03-17 DIAGNOSIS — N183 Chronic kidney disease, stage 3 unspecified: Secondary | ICD-10-CM | POA: Diagnosis not present

## 2020-03-17 DIAGNOSIS — D4102 Neoplasm of uncertain behavior of left kidney: Secondary | ICD-10-CM | POA: Diagnosis not present

## 2020-03-17 DIAGNOSIS — C641 Malignant neoplasm of right kidney, except renal pelvis: Secondary | ICD-10-CM | POA: Diagnosis not present

## 2020-03-17 DIAGNOSIS — Z125 Encounter for screening for malignant neoplasm of prostate: Secondary | ICD-10-CM | POA: Diagnosis not present

## 2020-03-17 DIAGNOSIS — R3914 Feeling of incomplete bladder emptying: Secondary | ICD-10-CM | POA: Diagnosis not present

## 2020-03-24 ENCOUNTER — Telehealth: Payer: Self-pay | Admitting: Internal Medicine

## 2020-03-24 NOTE — Telephone Encounter (Signed)
Patient states he is returning calls from [place] about scheduling an appointment. I did not see any notes.

## 2020-03-24 NOTE — Telephone Encounter (Signed)
Pt reports he had 3 calls from [place] to call him.  I explained those were previous messages that were left when sh was trying to arrange his heart catheterization and that she has not tried to reach him since that time.  I reviewed with him the appointment with [name] on 5/2, plan for labs that day, and that I will give cath instructions to him at that time. Reviewed cath day/time, possibility for hydration.  He wants to confirm that we have in his chart that he has only one kidney. Confirmed chart shows right nephrectomy in 2018.

## 2020-04-05 DIAGNOSIS — D4102 Neoplasm of uncertain behavior of left kidney: Secondary | ICD-10-CM | POA: Diagnosis not present

## 2020-05-01 NOTE — Progress Notes (Signed)
 Cardiology Office Note   Date:  05/02/2020   ID:  [name], DOB 09/06/1955, MRN [id number]  PCP:  Gates, Robert, MD  Cardiologist:   Tuwanda Vokes, MD    F/U of mitral valve dz       History of Present Illness: [name] is a 64 y.o. 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 with resultant CVA and  then bleed    TEE was also  done showing thickened MV but no vegetation  Mild to mod MR  Prolapse noted  In 2017 the patient was admitted for sepsis  Echo/TEE done   No acute vegetation  MVP and MR noted    He was incidentally found to have a renal mass  and renal cell CA  Underwent nephrectomy in Feb 2018 He was seen in ED on 06/16/17   He got up to go to bathroom  Urinated   On getting up dizzy   Went to floor  Broke nose      I last saw the pt in Nov 2021  He went on to have a TTE in Dec 2021 and then TEE in Jan 2022 Results below   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  The pt comes in today for precath evaluation Breathing is OK   He denies dizzienss   No CP   Active   Currently with plans to go to [place] before having MV surgery    Outpatient Medications Prior to Visit  Medication Sig Dispense Refill  . amLODipine (NORVASC) 5 MG tablet Take 5 mg by mouth daily.    . amoxicillin (AMOXIL) 500 MG capsule Take 500 mg by mouth as directed. 4 pills one hour before dental appointment    . buPROPion (WELLBUTRIN XL) 150 MG 24 hr tablet Take 150 mg by mouth daily.    . Cholecalciferol (VITAMIN D3) 2000 units TABS Take 4,000 Units by mouth daily.    . diphenhydramine-acetaminophen (TYLENOL PM) 25-500 MG TABS tablet Take 2 tablets by mouth at bedtime.    . finasteride (PROSCAR) 5 MG tablet Take 1 tablet (5 mg total) by mouth daily. 30 tablet 0  . losartan (COZAAR) 100 MG tablet Take 100 mg by mouth daily.    . Melatonin 10 MG TABS Take 10 mg by mouth at bedtime.     .  Omega-3 Fatty Acids (FISH OIL PO) Take 400 mg by mouth daily.    . Probiotic Product (PROBIOTIC DAILY PO) Take 1 tablet by mouth daily at 12 noon.    . rosuvastatin (CRESTOR) 10 MG tablet TAKE 1 TABLET ONCE DAILY. 90 tablet 3   No facility-administered medications prior to visit.     Allergies:   Patient has no known allergies.   Past Medical History:  Diagnosis Date  . Bacteremia 110/2017  . Cancer (HCC)    renal mass at preop  . CVA (cerebral infarction)    hemmorhagic  CVA  . Depression   . Dyslipidemia   . Endocarditis    MV Vegitation  . History of kidney stones   . Hypertension   . Mitral regurgitation   . Myocardial infarction (HCC)   . Stroke (HCC)     Past Surgical History:  Procedure Laterality Date  . NO PAST SURGERIES    . ROBOT ASSISTED LAPAROSCOPIC   NEPHRECTOMY Right 02/22/2016   Procedure: XI ROBOTIC ASSISTED LAPAROSCOPIC NEPHRECTOMY;  Surgeon: Theodore Manny, MD;  Location: [facility];  Service: Urology;  Laterality: Right;  . TEE WITHOUT CARDIOVERSION N/A 01/28/2020   Procedure: TRANSESOPHAGEAL ECHOCARDIOGRAM (TEE);  Surgeon: Taeko Schaffer V, MD;  [facility];  Service: Cardiovascular;  Laterality: N/A;  . TONSILLECTOMY       Social History:  The patient  reports that he has never smoked. He has never used smokeless tobacco. He reports current alcohol use of about 7.0 standard drinks of alcohol per week. He reports current drug use. Drug: Marijuana.   Family History:  The patient's family history includes Heart disease in his mother.    ROS:  Please see the history of present illness. All other systems are reviewed and  Negative to the above problem except as noted.    PHYSICAL EXAM: VS:  BP (!) 148/80   Pulse 83   Ht 5' 8" (1.727 m)   Wt 184 lb (83.5 kg)   SpO2 97%   BMI 27.98 kg/m   GEN: Obese 65 yo in no acute distress  HEENT: normal  Neck: JVP normal  , carotid bruits, or masses Cardiac: RRR; Gr II/VI systolic murmur mid L chest     Respiratory:  clear to auscultation bilaterally,  GI: soft, nontender, nondistended, + BS  No hepatomegaly  MS: no deformity Moving all extremities   Skin: warm and dry, no rash Neuro:  Strength and sensation are intact Psych: euthymic mood, full affect   EKG:  EKG is ordered today.   SR  84 bpm   RBBB.  Nonspecific ST changes    TTE   (12/23/20)  1. Left ventricular ejection fraction, by estimation, is 55 to 60%. The left ventricle has normal function. The left ventricle has no regional wall motion abnormalities. Left ventricular diastolic parameters are consistent with Grade I diastolic dysfunction (impaired relaxation). Elevated left ventricular end-diastolic pressure. 2. Right ventricular systolic function is normal. The right ventricular size is normal. 3. Left atrial size was severely dilated. 4. The mitral valve is abnormal. Moderate to probably severe mitral valve regurgitation. There is moderate late systolic prolapse of multiple scallops of the posterior leaflet of the mitral valve. The regurgitant jet hugs the atrial septum. 5. The aortic valve is tricuspid. Aortic valve regurgitation is mild and is directed toward the anterior mitral leaflet. 6. Aortic dilatation noted. There is mild dilatation of the aortic root, measuring 40 mm. Comparison(s): Changes from prior study are noted. 10/15/2017: 55-60%, mild AI, posterior mitral leaflet prolapse with mild MR, moderate LAE. Conclusion(s)/Recommendation(s): Findings concerning for severe eccentric MR, would recommend Transesophageal Echocardiogram for clarification.   TEE  01/28/20  1. Left ventricular ejection fraction, by estimation, is 60 to 65%. The left ventricle has normal function. The left ventricle has no regional wall motion abnormalities. 2. Right ventricular systolic function is normal. The right ventricular size is normal. 3. Left atrial size was mildly dilated. No left atrial/left atrial appendage thrombus was  detected. 4. The mitral valve is abnormal. Carpentier Type II mitral regurgitation. The P3 segment of the valve is partially flail, with prolapse of P2/P3. The mitral regurgitation is severe with a few jets, the most prominent being anteriorly direct, coursing to the back of the left atrium 5. The aortic valve is tricuspid. Aortic valve regurgitation is mild. It starts central and is posteriorly directed. 6. Mild fixed atherosclerotic plaquing in thoracic aorta.  Lipid Panel    Component Value   Date/Time   CHOL 151 12/13/2017 1147   TRIG 114 12/13/2017 1147   HDL 53 12/13/2017 1147   CHOLHDL 2.8 12/13/2017 1147   CHOLHDL 4 01/19/2010 0904   VLDL 22.4 01/19/2010 0904   LDLCALC 75 12/13/2017 1147   LDLDIRECT 164.1 01/19/2010 0904      Wt Readings from Last 3 Encounters:  05/02/20 184 lb (83.5 kg)  02/22/20 182 lb (82.6 kg)  01/28/20 180 lb (81.6 kg)      ASSESSMENT AND PLAN:  1  Mitral valve dz   Pt with hx of  endocarditis and mitral valve  prolapse   Has had multiple echoes/TEEs in past    Last TEE noted above with severe MR  The pt has  already met with C Owen   AHe arees with recomm for MV repair to correct valve dysfunction Plan for R/L heart cath on 5/11 (preop) and then f/u with surgery in clinic      2  Hx dizziness  Pt with no further spells  Stay hydrated    3 HL   LDL 71  HDL 62  Trg 162   Keep on Crestor    4  Renal  Pt is s/p nephrectomy   Check BMET today  No LV gram at cath        Current medicines are reviewed at length with the patient today.  The patient does not have concerns regarding medicines.  Signed, Juandiego Kolenovic, MD  05/02/2020 1:04 PM    [facility] [address], [place], [place]  [address] Phone: [phone]; Fax: [phone]     

## 2020-05-01 NOTE — H&P (View-Only) (Signed)
Cardiology Office Note   Date:  05/02/2020   ID:  Jahkari Maclin, DOB 12/25/1955, MRN [id number]  PCP:  Josetta Huddle, MD  Cardiologist:   Dorris Carnes, MD    F/U of mitral valve dz       History of Present Illness: Dontell Mian is a 65 y.o. 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 with resultant CVA and  then bleed    TEE was also  done showing thickened MV but no vegetation  Mild to mod MR  Prolapse noted  In 2017 the patient was admitted for sepsis  Echo/TEE done   No acute vegetation  MVP and MR noted    He was incidentally found to have a renal mass  and renal cell CA  Underwent nephrectomy in Feb 2018 He was seen in ED on 06/16/17   He got up to go to bathroom  Urinated   On getting up dizzy   Went to floor  Broke nose      I last saw the pt in Nov 2021  He went on to have a TTE in Dec 2021 and then TEE in Jan 2022 Results below   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  The pt comes in today for precath evaluation Breathing is OK   He denies dizzienss   No CP   Active   Currently with plans to go to [place] before having MV surgery    Outpatient Medications Prior to Visit  Medication Sig Dispense Refill  . amLODipine (NORVASC) 5 MG tablet Take 5 mg by mouth daily.    Marland Kitchen amoxicillin (AMOXIL) 500 MG capsule Take 500 mg by mouth as directed. 4 pills one hour before dental appointment    . buPROPion (WELLBUTRIN XL) 150 MG 24 hr tablet Take 150 mg by mouth daily.    . Cholecalciferol (VITAMIN D3) 2000 units TABS Take 4,000 Units by mouth daily.    . diphenhydramine-acetaminophen (TYLENOL PM) 25-500 MG TABS tablet Take 2 tablets by mouth at bedtime.    . finasteride (PROSCAR) 5 MG tablet Take 1 tablet (5 mg total) by mouth daily. 30 tablet 0  . losartan (COZAAR) 100 MG tablet Take 100 mg by mouth daily.    . Melatonin 10 MG TABS Take 10 mg by mouth at bedtime.     .  Omega-3 Fatty Acids (FISH OIL PO) Take 400 mg by mouth daily.    . Probiotic Product (PROBIOTIC DAILY PO) Take 1 tablet by mouth daily at 12 noon.    . rosuvastatin (CRESTOR) 10 MG tablet TAKE 1 TABLET ONCE DAILY. 90 tablet 3   No facility-administered medications prior to visit.     Allergies:   Patient has no known allergies.   Past Medical History:  Diagnosis Date  . Bacteremia 110/2017  . Cancer ([place])    renal mass at preop  . CVA (cerebral infarction)    hemmorhagic  CVA  . Depression   . Dyslipidemia   . Endocarditis    MV Vegitation  . History of kidney stones   . Hypertension   . Mitral regurgitation   . Myocardial infarction ([place])   . Stroke [facility])     Past Surgical History:  Procedure Laterality Date  . NO PAST SURGERIES    . ROBOT ASSISTED LAPAROSCOPIC  NEPHRECTOMY Right 02/22/2016   Procedure: XI ROBOTIC ASSISTED LAPAROSCOPIC NEPHRECTOMY;  Surgeon: Alexis Frock, MD;  [facility];  Service: Urology;  Laterality: Right;  . TEE WITHOUT CARDIOVERSION N/A 01/28/2020   Procedure: TRANSESOPHAGEAL ECHOCARDIOGRAM (TEE);  Surgeon: Fay Records, MD;  Location: [facility];  Service: Cardiovascular;  Laterality: N/A;  . TONSILLECTOMY       Social History:  The patient  reports that he has never smoked. He has never used smokeless tobacco. He reports current alcohol use of about 7.0 standard drinks of alcohol per week. He reports current drug use. Drug: Marijuana.   Family History:  The patient's family history includes Heart disease in his mother.    ROS:  Please see the history of present illness. All other systems are reviewed and  Negative to the above problem except as noted.    PHYSICAL EXAM: VS:  BP (!) 148/80   Pulse 83   Ht 5' 8" (1.727 m)   Wt 184 lb (83.5 kg)   SpO2 97%   BMI 27.98 kg/m   GEN: Obese 65 yo in no acute distress  HEENT: normal  Neck: JVP normal  , carotid bruits, or masses Cardiac: RRR; Gr II/VI systolic murmur mid L chest     Respiratory:  clear to auscultation bilaterally,  GI: soft, nontender, nondistended, + BS  No hepatomegaly  MS: no deformity Moving all extremities   Skin: warm and dry, no rash Neuro:  Strength and sensation are intact Psych: euthymic mood, full affect   EKG:  EKG is ordered today.   SR  84 bpm   RBBB.  Nonspecific ST changes    TTE   (12/23/20)  1. Left ventricular ejection fraction, by estimation, is 55 to 60%. The left ventricle has normal function. The left ventricle has no regional wall motion abnormalities. Left ventricular diastolic parameters are consistent with Grade I diastolic dysfunction (impaired relaxation). Elevated left ventricular end-diastolic pressure. 2. Right ventricular systolic function is normal. The right ventricular size is normal. 3. Left atrial size was severely dilated. 4. The mitral valve is abnormal. Moderate to probably severe mitral valve regurgitation. There is moderate late systolic prolapse of multiple scallops of the posterior leaflet of the mitral valve. The regurgitant jet hugs the atrial septum. 5. The aortic valve is tricuspid. Aortic valve regurgitation is mild and is directed toward the anterior mitral leaflet. 6. Aortic dilatation noted. There is mild dilatation of the aortic root, measuring 40 mm. Comparison(s): Changes from prior study are noted. 10/15/2017: 55-60%, mild AI, posterior mitral leaflet prolapse with mild MR, moderate LAE. Conclusion(s)/Recommendation(s): Findings concerning for severe eccentric MR, would recommend Transesophageal Echocardiogram for clarification.   TEE  01/28/20  1. Left ventricular ejection fraction, by estimation, is 60 to 65%. The left ventricle has normal function. The left ventricle has no regional wall motion abnormalities. 2. Right ventricular systolic function is normal. The right ventricular size is normal. 3. Left atrial size was mildly dilated. No left atrial/left atrial appendage thrombus was  detected. 4. The mitral valve is abnormal. Carpentier Type II mitral regurgitation. The P3 segment of the valve is partially flail, with prolapse of P2/P3. The mitral regurgitation is severe with a few jets, the most prominent being anteriorly direct, coursing to the back of the left atrium 5. The aortic valve is tricuspid. Aortic valve regurgitation is mild. It starts central and is posteriorly directed. 6. Mild fixed atherosclerotic plaquing in thoracic aorta.  Lipid Panel    Component Value  Date/Time   CHOL 151 12/13/2017 1147   TRIG 114 12/13/2017 1147   HDL 53 12/13/2017 1147   CHOLHDL 2.8 12/13/2017 1147   CHOLHDL 4 01/19/2010 0904   VLDL 22.4 01/19/2010 0904   LDLCALC 75 12/13/2017 1147   LDLDIRECT 164.1 01/19/2010 0904      Wt Readings from Last 3 Encounters:  05/02/20 184 lb (83.5 kg)  02/22/20 182 lb (82.6 kg)  01/28/20 180 lb (81.6 kg)      ASSESSMENT AND PLAN:  1  Mitral valve dz   Pt with hx of  endocarditis and mitral valve  prolapse   Has had multiple echoes/TEEs in past    Last TEE noted above with severe MR  The pt has  already met with C Bari Edward arees with recomm for MV repair to correct valve dysfunction Plan for R/L heart cath on 5/11 (preop) and then f/u with surgery in clinic      2  Hx dizziness  Pt with no further spells  Stay hydrated    3 HL   LDL 71  HDL 62  Trg 162   Keep on Crestor    4  Renal  Pt is s/p nephrectomy   Check BMET today  No LV gram at cath        Current medicines are reviewed at length with the patient today.  The patient does not have concerns regarding medicines.  Signed, Dorris Carnes, MD  05/02/2020 1:04 PM    [facility] [place], [place], [place]  [address] Phone: [phone]; Fax: [phone]

## 2020-05-02 ENCOUNTER — Other Ambulatory Visit: Payer: Self-pay

## 2020-05-02 ENCOUNTER — Encounter: Payer: Self-pay | Admitting: Internal Medicine

## 2020-05-02 ENCOUNTER — Ambulatory Visit (INDEPENDENT_AMBULATORY_CARE_PROVIDER_SITE_OTHER): Payer: BC Managed Care – PPO | Admitting: Internal Medicine

## 2020-05-02 VITALS — BP 148/80 | HR 83 | Ht 68.0 in | Wt 184.0 lb

## 2020-05-02 DIAGNOSIS — Z01812 Encounter for preprocedural laboratory examination: Secondary | ICD-10-CM | POA: Diagnosis not present

## 2020-05-02 DIAGNOSIS — I340 Nonrheumatic mitral (valve) insufficiency: Secondary | ICD-10-CM

## 2020-05-02 NOTE — Patient Instructions (Addendum)
Medication Instructions:  Your physician recommends that you continue on your current medications as directed. Please refer to the Current Medication list given to you today.  *If you need a refill on your cardiac medications before your next appointment, please call your pharmacy*   Lab Work: BMET, CBC If you have labs (blood work) drawn today and your tests are completely normal, you will receive your results only by: Marland Kitchen MyChart Message (if you have MyChart) OR . A paper copy in the mail If you have any lab test that is abnormal or we need to change your treatment, we will call you to review the results.   Testing/Procedures:    [facility] OFFICE [place], [place] [address] Dept: [phone] Loc: [phone]  [name]  05/02/2020  You are scheduled for a Cardiac Catheterization on Wednesday, May 11 with Dr. Sherren Mocha.  1. Please arrive at the [facility] (Main Entrance A) at [facility]: [address] [place], [place] [address] at 9:30 AM (This time is two hours before your procedure to ensure your preparation). Free valet parking service is available.   Special note: Every effort is made to have your procedure done on time. Please understand that emergencies sometimes delay scheduled procedures.  2. Diet: Do not eat solid foods after midnight.  The patient may have clear liquids until 5am upon the day of the procedure.  3. Labs: You will need to have blood drawn on Monday, May 2 at [facility] at [facility]. [address]  Open: 7:30am - 5pm    Phone: [phone]. You do not need to be fasting.  4. Medication instructions in preparation for your procedure:   Contrast Allergy: No  On the morning of your procedure, take your Aspirin and any morning medicines NOT listed above.  You may use sips of water.  5. Plan for one night  stay--bring personal belongings. 6. Bring a current list of your medications and current insurance cards. 7. You MUST have a responsible person to drive you home. 8. Someone MUST be with you the first 24 hours after you arrive home or your discharge will be delayed. 9. Please wear clothes that are easy to get on and off and wear slip-on shoes.  Thank you for allowing [place] to care for you!   -- [facility]   Pre-procedure COVID testing Information:  MAY 9,2022 AT 1:20 PM   [place], [place] [address] This is a drive-up visit.   You will need to go home and quarantine between [place] test and procedures on    Directions:   -[place] on [address] from central [place]: Stay on [address] then pass the exit to [employer]. It is the next right on  & [facility] and has a lane to exit into the practice drive-thru parking lot.   -Easy access from [facility] and the [address]. Exit onto [address] going toward [address].        Follow-Up: At [facility], you and your health needs are our priority.  As part of our continuing mission to provide you with exceptional heart care, we have created designated Provider Care Teams.  These Care Teams include your primary Cardiologist (physician) and Advanced Practice Providers (APPs -  Physician Assistants and Nurse Practitioners) who all work together to provide you with the  care you need, when you need it.  We recommend signing up for the patient portal called "MyChart".  Sign up information is provided on this After Visit Summary.  MyChart is used to connect with patients for Virtual Visits (Telemedicine).  Patients are able to view lab/test results, encounter notes, upcoming appointments, etc.  Non-urgent messages can be sent to your provider as well.   To learn more about what you can do with MyChart, go to [email or web address].    Your next appointment:    12 month(s)  The format for your next appointment:   In Person  Provider:   Dorris Carnes, MD   Other Instructions

## 2020-05-03 DIAGNOSIS — C641 Malignant neoplasm of right kidney, except renal pelvis: Secondary | ICD-10-CM | POA: Diagnosis not present

## 2020-05-03 DIAGNOSIS — D4102 Neoplasm of uncertain behavior of left kidney: Secondary | ICD-10-CM | POA: Diagnosis not present

## 2020-05-03 LAB — BASIC METABOLIC PANEL
BUN/Creatinine Ratio: 20 (ref 10–24)
BUN: 27 mg/dL (ref 8–27)
CO2: 22 mmol/L (ref 20–29)
Calcium: 10 mg/dL (ref 8.6–10.2)
Chloride: 102 mmol/L (ref 96–106)
Creatinine, Ser: 1.35 mg/dL — ABNORMAL HIGH (ref 0.76–1.27)
Glucose: 91 mg/dL (ref 65–99)
Potassium: 4.7 mmol/L (ref 3.5–5.2)
Sodium: 140 mmol/L (ref 134–144)
eGFR: 59 mL/min/{1.73_m2} — ABNORMAL LOW (ref >59)

## 2020-05-03 LAB — CBC
Hematocrit: 41.8 % (ref 37.5–51.0)
Hemoglobin: 14.7 g/dL (ref 13.0–17.7)
MCH: 30.5 pg (ref 26.6–33.0)
MCHC: 35.2 g/dL (ref 31.5–35.7)
MCV: 87 fL (ref 79–97)
Platelets: 247 10*3/uL (ref 150–450)
RBC: 4.82 x10E6/uL (ref 4.14–5.80)
RDW: 13.2 % (ref 11.6–15.4)
WBC: 6.3 10*3/uL (ref 3.4–10.8)

## 2020-05-09 ENCOUNTER — Telehealth: Payer: Self-pay

## 2020-05-09 ENCOUNTER — Other Ambulatory Visit (HOSPITAL_COMMUNITY)
Admission: RE | Admit: 2020-05-09 | Discharge: 2020-05-09 | Disposition: A | Discharge status: Home / Self Care | Payer: BC Managed Care – PPO | Source: Ambulatory Visit | Attending: Cardiovascular Disease | Admitting: Cardiovascular Disease

## 2020-05-09 DIAGNOSIS — I341 Nonrheumatic mitral (valve) prolapse: Secondary | ICD-10-CM | POA: Diagnosis not present

## 2020-05-09 DIAGNOSIS — Z79899 Other long term (current) drug therapy: Secondary | ICD-10-CM | POA: Diagnosis not present

## 2020-05-09 DIAGNOSIS — Z01812 Encounter for preprocedural laboratory examination: Secondary | ICD-10-CM | POA: Insufficient documentation

## 2020-05-09 DIAGNOSIS — Z905 Acquired absence of kidney: Secondary | ICD-10-CM | POA: Diagnosis not present

## 2020-05-09 DIAGNOSIS — E785 Hyperlipidemia, unspecified: Secondary | ICD-10-CM | POA: Diagnosis not present

## 2020-05-09 DIAGNOSIS — Z20822 Contact with and (suspected) exposure to covid-19: Secondary | ICD-10-CM | POA: Insufficient documentation

## 2020-05-09 LAB — SARS CORONAVIRUS 2 (TAT 6-24 HRS): SARS Coronavirus 2: NEGATIVE

## 2020-05-09 NOTE — Telephone Encounter (Signed)
Pt verbalized understanding  to hold his Losartan the morning of his heart cath 05/11/20 due to GFR 59.

## 2020-05-10 ENCOUNTER — Telehealth: Payer: Self-pay | Admitting: *Deleted

## 2020-05-10 NOTE — Telephone Encounter (Signed)
Pt contacted pre-catheterization scheduled at [facility] for: Wednesday May 11, 2020 11:30 AM Verified arrival time and place: [facility]) at: 9:30 AM   No solid food after midnight prior to cath, clear liquids until 5 AM day of procedure.  Hold: Losartan-AM of procedure-GFR 59  Except hold medications AM meds can be  taken pre-cath with sips of water including: ASA 81 mg   Confirmed patient has responsible adult to drive home post procedure and be with patient first 24 hours after arriving home: yes  You are allowed ONE visitor in the waiting room during the time you are at the hospital for your procedure. Both you and your visitor must wear a mask once you enter the hospital.   Reviewed procedure/mask/visitor instructions with patient.

## 2020-05-11 ENCOUNTER — Ambulatory Visit (HOSPITAL_COMMUNITY)
Admission: RE | Admit: 2020-05-11 | Discharge: 2020-05-11 | Disposition: A | Discharge status: Home / Self Care | Payer: BC Managed Care – PPO | Attending: Cardiovascular Disease | Admitting: Cardiovascular Disease

## 2020-05-11 ENCOUNTER — Other Ambulatory Visit: Payer: Self-pay

## 2020-05-11 ENCOUNTER — Encounter (HOSPITAL_COMMUNITY)
Admission: RE | Disposition: A | Discharge status: Home / Self Care | Payer: Self-pay | Source: Home / Self Care | Attending: Cardiovascular Disease

## 2020-05-11 DIAGNOSIS — Z905 Acquired absence of kidney: Secondary | ICD-10-CM | POA: Diagnosis not present

## 2020-05-11 DIAGNOSIS — Z20822 Contact with and (suspected) exposure to covid-19: Secondary | ICD-10-CM | POA: Insufficient documentation

## 2020-05-11 DIAGNOSIS — I341 Nonrheumatic mitral (valve) prolapse: Secondary | ICD-10-CM | POA: Diagnosis not present

## 2020-05-11 DIAGNOSIS — Z006 Encounter for examination for normal comparison and control in clinical research program: Secondary | ICD-10-CM

## 2020-05-11 DIAGNOSIS — I340 Nonrheumatic mitral (valve) insufficiency: Secondary | ICD-10-CM | POA: Diagnosis not present

## 2020-05-11 DIAGNOSIS — Z79899 Other long term (current) drug therapy: Secondary | ICD-10-CM | POA: Diagnosis not present

## 2020-05-11 DIAGNOSIS — E785 Hyperlipidemia, unspecified: Secondary | ICD-10-CM | POA: Diagnosis not present

## 2020-05-11 HISTORY — PX: RIGHT/LEFT HEART CATH AND CORONARY ANGIOGRAPHY: CATH118266

## 2020-05-11 LAB — POCT I-STAT 7, (LYTES, BLD GAS, ICA,H+H)
Acid-base deficit: 2 mmol/L (ref 0.0–2.0)
Bicarbonate: 23.4 mmol/L (ref 20.0–28.0)
Calcium, Ion: 1.21 mmol/L (ref 1.15–1.40)
HCT: 38 % — ABNORMAL LOW (ref 39.0–52.0)
Hemoglobin: 12.9 g/dL — ABNORMAL LOW (ref 13.0–17.0)
O2 Saturation: 100 %
Potassium: 4.2 mmol/L (ref 3.5–5.1)
Sodium: 142 mmol/L (ref 135–145)
TCO2: 25 mmol/L (ref 22–32)
pCO2 arterial: 42.6 mmHg (ref 32.0–48.0)
pH, Arterial: 7.349 — ABNORMAL LOW (ref 7.350–7.450)
pO2, Arterial: 179 mmHg — ABNORMAL HIGH (ref 83.0–108.0)

## 2020-05-11 LAB — POCT I-STAT EG7
Acid-base deficit: 2 mmol/L (ref 0.0–2.0)
Bicarbonate: 24.1 mmol/L (ref 20.0–28.0)
Calcium, Ion: 1.22 mmol/L (ref 1.15–1.40)
HCT: 39 % (ref 39.0–52.0)
Hemoglobin: 13.3 g/dL (ref 13.0–17.0)
O2 Saturation: 76 %
Potassium: 4.1 mmol/L (ref 3.5–5.1)
Sodium: 142 mmol/L (ref 135–145)
TCO2: 25 mmol/L (ref 22–32)
pCO2, Ven: 45.7 mmHg (ref 44.0–60.0)
pH, Ven: 7.33 (ref 7.250–7.430)
pO2, Ven: 44 mmHg (ref 32.0–45.0)

## 2020-05-11 SURGERY — RIGHT/LEFT HEART CATH AND CORONARY ANGIOGRAPHY
Anesthesia: LOCAL

## 2020-05-11 MED ORDER — HEPARIN (PORCINE) IN NACL 1000-0.9 UT/500ML-% IV SOLN
INTRAVENOUS | Status: DC | PRN
Start: 2020-05-11 — End: 2020-05-11
  Administered 2020-05-11 (×2): 500 mL

## 2020-05-11 MED ORDER — SODIUM CHLORIDE 0.9 % WEIGHT BASED INFUSION
3.0000 mL/kg/h | INTRAVENOUS | Status: AC
  Administered 2020-05-11: 3 mL/kg/h via INTRAVENOUS

## 2020-05-11 MED ORDER — HEPARIN (PORCINE) IN NACL 1000-0.9 UT/500ML-% IV SOLN
INTRAVENOUS | Status: AC
  Filled 2020-05-11: qty 1000

## 2020-05-11 MED ORDER — LIDOCAINE HCL (PF) 1 % IJ SOLN
INTRAMUSCULAR | Status: AC
  Filled 2020-05-11: qty 30

## 2020-05-11 MED ORDER — SODIUM CHLORIDE 0.9% FLUSH: 3.0000 mL | INTRAVENOUS | Status: DC | PRN

## 2020-05-11 MED ORDER — SODIUM CHLORIDE 0.9 % IV SOLN: 250.0000 mL | INTRAVENOUS | Status: DC | PRN

## 2020-05-11 MED ORDER — VERAPAMIL HCL 2.5 MG/ML IV SOLN
INTRAVENOUS | Status: DC | PRN
  Administered 2020-05-11: 10 mL via INTRA_ARTERIAL

## 2020-05-11 MED ORDER — SODIUM CHLORIDE 0.9% FLUSH: 3.0000 mL | Freq: Two times a day (BID) | INTRAVENOUS | Status: DC

## 2020-05-11 MED ORDER — IOHEXOL 350 MG/ML SOLN
INTRAVENOUS | Status: DC | PRN
  Administered 2020-05-11: 45 mL

## 2020-05-11 MED ORDER — ACETAMINOPHEN 325 MG PO TABS: 650.0000 mg | ORAL_TABLET | ORAL | Status: DC | PRN

## 2020-05-11 MED ORDER — HEPARIN SODIUM (PORCINE) 1000 UNIT/ML IJ SOLN
INTRAMUSCULAR | Status: DC | PRN
  Administered 2020-05-11: 4000 [IU] via INTRAVENOUS

## 2020-05-11 MED ORDER — LABETALOL HCL 5 MG/ML IV SOLN: 10.0000 mg | INTRAVENOUS | Status: DC | PRN

## 2020-05-11 MED ORDER — LIDOCAINE HCL (PF) 1 % IJ SOLN
INTRAMUSCULAR | Status: DC | PRN
Start: 2020-05-11 — End: 2020-05-11
  Administered 2020-05-11 (×3): 2 mL

## 2020-05-11 MED ORDER — ONDANSETRON HCL 4 MG/2ML IJ SOLN: 4.0000 mg | Freq: Four times a day (QID) | INTRAMUSCULAR | Status: DC | PRN

## 2020-05-11 MED ORDER — MIDAZOLAM HCL 2 MG/2ML IJ SOLN
INTRAMUSCULAR | Status: DC | PRN
  Administered 2020-05-11: 2 mg via INTRAVENOUS

## 2020-05-11 MED ORDER — SODIUM CHLORIDE 0.9 % WEIGHT BASED INFUSION: 1.0000 mL/kg/h | INTRAVENOUS | Status: DC

## 2020-05-11 MED ORDER — HEPARIN SODIUM (PORCINE) 1000 UNIT/ML IJ SOLN
INTRAMUSCULAR | Status: AC
  Filled 2020-05-11: qty 1

## 2020-05-11 MED ORDER — HYDRALAZINE HCL 20 MG/ML IJ SOLN: 10.0000 mg | INTRAMUSCULAR | Status: DC | PRN

## 2020-05-11 MED ORDER — ASPIRIN 81 MG PO CHEW: 81.0000 mg | CHEWABLE_TABLET | ORAL | Status: DC

## 2020-05-11 MED ORDER — FENTANYL CITRATE (PF) 100 MCG/2ML IJ SOLN
INTRAMUSCULAR | Status: DC | PRN
  Administered 2020-05-11: 25 ug via INTRAVENOUS

## 2020-05-11 MED ORDER — MIDAZOLAM HCL 2 MG/2ML IJ SOLN
INTRAMUSCULAR | Status: AC
  Filled 2020-05-11: qty 2

## 2020-05-11 MED ORDER — FENTANYL CITRATE (PF) 100 MCG/2ML IJ SOLN
INTRAMUSCULAR | Status: AC
  Filled 2020-05-11: qty 2

## 2020-05-11 MED ORDER — VERAPAMIL HCL 2.5 MG/ML IV SOLN
INTRAVENOUS | Status: AC
  Filled 2020-05-11: qty 2

## 2020-05-11 SURGICAL SUPPLY — 13 items
CATH 5FR JL3.5 JR4 ANG PIG MP (CATHETERS) ×2 IMPLANT
CATH EXPO 5F MPA-1 (CATHETERS) ×2 IMPLANT
CATH SWAN GANZ 7F STRAIGHT (CATHETERS) ×2 IMPLANT
DEVICE RAD COMP TR BAND LRG (VASCULAR PRODUCTS) ×2 IMPLANT
GLIDESHEATH SLEND SS 6F .021 (SHEATH) ×2 IMPLANT
GLIDESHEATH SLENDER 7FR .021G (SHEATH) ×2 IMPLANT
GUIDEWIRE .025 260CM (WIRE) ×2 IMPLANT
GUIDEWIRE INQWIRE 1.5J.035X260 (WIRE) ×1 IMPLANT
INQWIRE 1.5J .035X260CM (WIRE) ×2
KIT HEART LEFT (KITS) ×2 IMPLANT
PACK CARDIAC CATHETERIZATION (CUSTOM PROCEDURE TRAY) ×2 IMPLANT
TRANSDUCER W/STOPCOCK (MISCELLANEOUS) ×2 IMPLANT
TUBING CIL FLEX 10 FLL-RA (TUBING) ×2 IMPLANT

## 2020-05-11 NOTE — Interval H&P Note (Signed)
History and Physical Interval Note:  05/11/2020 1:57 PM  [name]  has presented today for surgery, with the diagnosis of MR.  The various methods of treatment have been discussed with the patient and family. After consideration of risks, benefits and other options for treatment, the patient has consented to  Procedure(s): RIGHT/LEFT HEART CATH AND CORONARY ANGIOGRAPHY (N/A) as a surgical intervention.  The patient's history has been reviewed, patient examined, no change in status, stable for surgery.  I have reviewed the patient's chart and labs.  Questions were answered to the patient's satisfaction.     Sherren Mocha

## 2020-05-11 NOTE — Research (Signed)
IDENTIFY-PH Informed Consent                  Subject Name: [name]   Subject met inclusion and exclusion criteria.  The informed consent form, study requirements and expectations were reviewed with the subject and questions and concerns were addressed prior to the signing of the consent form.  The subject verbalized understanding of the trial requirements.  The subject agreed to participate in the IDENTIFY-PH trial and signed the informed consent at 09:57AM on 05/11/20.  The informed consent was obtained prior to performance of any protocol-specific procedures for the subject.  A copy of the signed informed consent was given to the subject and a copy was placed in the subject's medical record.   Sallye Ober , Research Assistant

## 2020-05-11 NOTE — Discharge Instructions (Signed)
Radial Site Care  This sheet gives you information about how to care for yourself after your procedure. Your health care provider may also give you more specific instructions. If you have problems or questions, contact your health care provider. What can I expect after the procedure? After the procedure, it is common to have:  Bruising and tenderness at the catheter insertion area. Follow these instructions at home: Medicines  Take over-the-counter and prescription medicines only as told by your health care provider. Insertion site care  Follow instructions from your health care provider about how to take care of your insertion site. Make sure you: ? Wash your hands with soap and water before you change your bandage (dressing). If soap and water are not available, use hand sanitizer. ? Change your dressing as told by your health care provider. ? Leave stitches (sutures), skin glue, or adhesive strips in place. These skin closures may need to stay in place for 2 weeks or longer. If adhesive strip edges start to loosen and curl up, you may trim the loose edges. Do not remove adhesive strips completely unless your health care provider tells you to do that.  Check your insertion site every day for signs of infection. Check for: ? Redness, swelling, or pain. ? Fluid or blood. ? Pus or a bad smell. ? Warmth.  Do not take baths, swim, or use a hot tub until your health care provider approves.  You may shower 24-48 hours after the procedure, or as directed by your health care provider. ? Remove the dressing and gently wash the site with plain soap and water. ? Pat the area dry with a clean towel. ? Do not rub the site. That could cause bleeding.  Do not apply powder or lotion to the site. Activity  For 24 hours after the procedure, or as directed by your health care provider: ? Do not flex or bend the affected arm. ? Do not push or pull heavy objects with the affected arm. ? Do not drive  yourself home from the hospital or clinic. You may drive 24 hours after the procedure unless your health care provider tells you not to. ? Do not operate machinery or power tools.  Do not lift anything that is heavier than 10 lb (4.5 kg), or the limit that you are told, until your health care provider says that it is safe.  Ask your health care provider when it is okay to: ? Return to work or school. ? Resume usual physical activities or sports. ? Resume sexual activity.   General instructions  If the catheter site starts to bleed, raise your arm and put firm pressure on the site. If the bleeding does not stop, get help right away. This is a medical emergency.  If you went home on the same day as your procedure, a responsible adult should be with you for the first 24 hours after you arrive home.  Keep all follow-up visits as told by your health care provider. This is important. Contact a health care provider if:  You have a fever.  You have redness, swelling, or yellow drainage around your insertion site. Get help right away if:  You have unusual pain at the radial site.  The catheter insertion area swells very fast.  The insertion area is bleeding, and the bleeding does not stop when you hold steady pressure on the area.  Your arm or hand becomes pale, cool, tingly, or numb. These symptoms may represent a serious   problem that is an emergency. Do not wait to see if the symptoms will go away. Get medical help right away. Call your local emergency services (911 in the [place] Do not drive yourself to the hospital. Summary  After the procedure, it is common to have bruising and tenderness at the site.  Follow instructions from your health care provider about how to take care of your radial site wound. Check the wound every day for signs of infection.  Do not lift anything that is heavier than 10 lb (4.5 kg), or the limit that you are told, until your health care provider says that it  is safe. This information is not intended to replace advice given to you by your health care provider. Make sure you discuss any questions you have with your health care provider. Document Revised: 01/23/2017 Document Reviewed: 01/23/2017 Elsevier Patient Education  2021 Elsevier Inc.  

## 2020-05-12 ENCOUNTER — Encounter (HOSPITAL_COMMUNITY): Payer: Self-pay | Admitting: Cardiovascular Disease

## 2020-05-16 ENCOUNTER — Other Ambulatory Visit: Payer: Self-pay

## 2020-05-16 ENCOUNTER — Encounter: Payer: Self-pay | Admitting: Thoracic Surgery (Cardiothoracic Vascular Surgery)

## 2020-05-16 ENCOUNTER — Ambulatory Visit (INDEPENDENT_AMBULATORY_CARE_PROVIDER_SITE_OTHER): Payer: BC Managed Care – PPO | Admitting: Thoracic Surgery (Cardiothoracic Vascular Surgery)

## 2020-05-16 ENCOUNTER — Ambulatory Visit
Admission: RE | Admit: 2020-05-16 | Discharge: 2020-05-16 | Disposition: A | Discharge status: Home / Self Care | Payer: BC Managed Care – PPO | Source: Ambulatory Visit | Attending: Thoracic Surgery (Cardiothoracic Vascular Surgery) | Admitting: Thoracic Surgery (Cardiothoracic Vascular Surgery)

## 2020-05-16 VITALS — BP 134/75 | HR 80 | Temp 97.6°F | Resp 20 | Ht 68.0 in | Wt 180.0 lb

## 2020-05-16 DIAGNOSIS — Z01818 Encounter for other preprocedural examination: Secondary | ICD-10-CM | POA: Diagnosis not present

## 2020-05-16 DIAGNOSIS — I119 Hypertensive heart disease without heart failure: Secondary | ICD-10-CM | POA: Diagnosis not present

## 2020-05-16 DIAGNOSIS — I340 Nonrheumatic mitral (valve) insufficiency: Secondary | ICD-10-CM

## 2020-05-16 DIAGNOSIS — N289 Disorder of kidney and ureter, unspecified: Secondary | ICD-10-CM | POA: Diagnosis not present

## 2020-05-16 DIAGNOSIS — Z85528 Personal history of other malignant neoplasm of kidney: Secondary | ICD-10-CM | POA: Diagnosis not present

## 2020-05-16 DIAGNOSIS — I341 Nonrheumatic mitral (valve) prolapse: Secondary | ICD-10-CM

## 2020-05-16 DIAGNOSIS — I2510 Atherosclerotic heart disease of native coronary artery without angina pectoris: Secondary | ICD-10-CM | POA: Diagnosis not present

## 2020-05-16 DIAGNOSIS — K3189 Other diseases of stomach and duodenum: Secondary | ICD-10-CM | POA: Diagnosis not present

## 2020-05-16 MED ORDER — IOPAMIDOL (ISOVUE-370) INJECTION 76%
75.0000 mL | Freq: Once | INTRAVENOUS | Status: AC | PRN
  Administered 2020-05-16: 75 mL via INTRAVENOUS

## 2020-05-16 NOTE — Progress Notes (Addendum)
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  He was initially seen in consultation on February 22, 2020.  At that time he wanted to hold off on further diagnostic work-up as he continued to do well clinically with no associated symptoms of exertional shortness of breath.  He was seen in follow-up by [name] in early May and made a decision to proceed with elective surgical intervention sometime this summer.  Diagnostic cardiac catheterization was performed May 11, 2020 and revealed normal coronary artery anatomy with no significant coronary artery disease.  Right heart pressures were normal.  CT angiography was performed and the patient presents to our office today for follow-up with his wife present.  He reports that he continues to do well clinically.  He remains reasonably active physically and he specifically denies any symptoms of exertional shortness of breath or other significant change in his exercise tolerance.  He has not had any chest pain or palpitations.  He reports no other new medical problems or complaints to speak of.   Current Outpatient Medications  Medication Sig Dispense Refill  . amLODipine (NORVASC) 5 MG tablet Take 5 mg by mouth daily.    Marland Kitchen amoxicillin (AMOXIL) 500 MG capsule Take 2,000 mg by mouth as directed. 4 pills one hour before dental appointment    . buPROPion (WELLBUTRIN XL) 150 MG 24 hr tablet Take 150 mg by  mouth daily.    . Cholecalciferol (VITAMIN D3) 2000 units TABS Take 4,000 Units by mouth daily.    . diphenhydramine-acetaminophen (TYLENOL PM) 25-500 MG TABS tablet Take 2 tablets by mouth at bedtime.    . finasteride (PROSCAR) 5 MG tablet Take 1 tablet (5 mg total) by mouth daily. 30 tablet 0  . losartan (COZAAR) 100 MG tablet Take 100 mg by mouth daily.    . Melatonin 10 MG TABS Take 10 mg by mouth at bedtime.     . Omega-3 Fatty Acids (FISH OIL PO) Take 400 mg by mouth daily.    . Probiotic Product (PROBIOTIC DAILY PO) Take 1 tablet by mouth daily.    . rosuvastatin (CRESTOR) 10 MG tablet TAKE 1 TABLET ONCE DAILY. (Patient taking differently: Take 10 mg by mouth daily.) 90 tablet 3  . tamsulosin (FLOMAX) 0.4 MG CAPS capsule Take 0.4 mg by mouth daily.     No current facility-administered medications for this visit.      Physical Exam:   BP 134/75   Pulse 80   Temp 97.6 F (36.4 C) (Skin)   Resp 20   Ht 5\' 8"  (1.727 m)   Wt 180 lb (81.6 kg)   SpO2 96% Comment: RA  BMI 27.37 kg/m   General:  Well-appearing  Chest:   Clear to auscultation  CV:   Regular rate and rhythm with grade 3/6 systolic murmur heard best at the apex  Incisions:  n/a  Abdomen:  Soft nontender  Extremities:  Warm and well-perfused  Diagnostic Tests:   TRANSESOPHOGEAL ECHO REPORT       Patient Name:  [facility] Date of Exam: 01/28/2020  Medical Rec #: [id number]   Height:    68.0 in  Accession #:  [id number]  Weight:    180.0 lb  Date of Birth: Aug 01, 1955   BSA:     1.954 m  Patient Age:  42 years   BP:      110/58 mmHg  Patient Gender: M       HR:      67 bpm.  Exam Location: Inpatient   Procedure: 2D Echo   Indications:  Mitral Valve Disorder    History:    Patient has prior history of Echocardiogram examinations,  most         recent 12/24/2019. Previous Myocardial Infarction; Risk         Factors:Hypertension and  Dyslipidemia.    Sonographer:  Mikki Santee RDCS (AE)  Referring Phys: [id number] PAULA V ROSS   PROCEDURE: The transesophogeal probe was passed without difficulty through  the esophogus of the patient. Sedation performed by different physician.  The patient was monitored while under deep sedation. Anesthestetic  sedation was provided intravenously by  Anesthesiology: 305mg  of Propofol, 60mg  of Lidocaine. The patient  developed no complications during the procedure.   IMPRESSIONS    1. Left ventricular ejection fraction, by estimation, is 60 to 65%. The  left ventricle has normal function. The left ventricle has no regional  wall motion abnormalities.  2. Right ventricular systolic function is normal. The right ventricular  size is normal.  3. Left atrial size was mildly dilated. No left atrial/left atrial  appendage thrombus was detected.  4. The mitral valve is abnormal. Carpentier Type II mitral regurgitation.  The P3 segment of the valve is partially flail, with prolapse of P2/P3.  The mitral regurgitation is severe with a few jets, the most prominent  being anteriorly direct, coursing to  the back of the left atrium  5. The aortic valve is tricuspid. Aortic valve regurgitation is mild. It  starts central and is posteriorly directed.  6. Mild fixed atherosclerotic plaquing in thoracic aorta.   FINDINGS  Left Ventricle: Left ventricular ejection fraction, by estimation, is 60  to 65%. The left ventricle has normal function. The left ventricle has no  regional wall motion abnormalities. The left ventricular internal cavity  size was normal in size.   Right Ventricle: The right ventricular size is normal. Right vetricular  wall thickness was not assessed. Right ventricular systolic function is  normal.   Left Atrium: Left atrial size was mildly dilated. No left atrial/left  atrial appendage thrombus was detected.   Right Atrium: Right atrial size was normal in  size.   Pericardium: There is no evidence of pericardial effusion.   Mitral Valve: The mitral valve is abnormal. Carpentier Type II mitral  regurgitation. THe P3 segment of the valve is partially flail with  prolapse of P2/P3. The mitral valve regurgitation is severe with a few  jets of MR, the most prominent being  anteriorly directed, coursing to the back of the left atrium .   Tricuspid Valve: The tricuspid valve is normal in structure. Tricuspid  valve regurgitation is trivial.   Aortic Valve: The aortic valve is tricuspid. Aortic valve regurgitation is  mild. It starts central and is posteriorly directed.   Pulmonic Valve: The pulmonic valve was normal in structure. Pulmonic valve  regurgitation is  mild.   Aorta: Mild fixed atherosclerotic plaquing in thoracic aorta. The aortic  root is normal in size and structure.   IAS/Shunts: No atrial level shunt detected by color flow Doppler.     MR Peak grad: 89.5 mmHg  MR Mean grad: 60.0 mmHg  MR Vmax:   473.00 cm/s  MR Vmean:   361.0 cm/s   Dorris Carnes MD  Electronically signed by Dorris Carnes MD  Signature Date/Time: 01/28/2020/3:13:22 PM    RIGHT/LEFT HEART CATH AND CORONARY ANGIOGRAPHY    Conclusion  1.  Widely patent coronary arteries with minimal irregularity, right dominant 2.  Normal/low intracardiac filling pressures with no evidence of pulmonary hypertension 3.  Preserved cardiac output  Indications  Nonrheumatic mitral valve regurgitation [I34.0 (ICD-10-CM)]   Procedural Details  Technical Details INDICATION: Severe primary mitral regurgitation, preoperative study  PROCEDURAL DETAILS: There was an indwelling IV in a right antecubital vein.  Attempts were made to change this out over a wire but this is unsuccessful.  The IV is removed.  Using ultrasound guidance, the brachial vein is accessed via direct puncture and a 7 French sheath is inserted.  Ultrasound images are digitally stored.  The right wrist  was then prepped, draped, and anesthetized with 1% lidocaine. Using direct ultrasound guidance a 5/6 French Slender sheath was placed in the right radial artery. Intra-arterial verapamil was administered through the radial artery sheath. IV heparin was administered after a JR4 catheter was advanced into the central aorta. A 7 French Swan-Ganz catheter was initially attempted for right heart cath but it would not pass through the arm even over a wire.  I was able to advance a 5 [place] multipurpose endhole catheter to perform the right heart catheterization.  Standard protocol was followed for recording of right heart pressures and sampling of oxygen saturations. Fick cardiac output was calculated. Standard Judkins catheters were used for selective coronary angiography. LV pressure is recorded and an aortic valve pullback is performed. There were no immediate procedural complications. The patient was transferred to the post catheterization recovery area for further monitoring.    Estimated blood loss <50 mL.   During this procedure medications were administered to achieve and maintain moderate conscious sedation while the patient's heart rate, blood pressure, and oxygen saturation were continuously monitored and I was present face-to-face 100% of this time.   Medications (Filter: Administrations occurring from 1335 to 1446 on 05/11/20) (important) Continuous medications are totaled by the amount administered until 05/11/20 1446.    midazolam (VERSED) injection (mg) Total dose:  2 mg  Date/Time Rate/Dose/Volume Action   05/11/20 1355 2 mg Given    fentaNYL (SUBLIMAZE) injection (mcg) Total dose:  25 mcg  Date/Time Rate/Dose/Volume Action   05/11/20 1355 25 mcg Given    lidocaine (PF) (XYLOCAINE) 1 % injection (mL) Total volume:  6 mL  Date/Time Rate/Dose/Volume Action   05/11/20 1406 2 mL Given   1411 2 mL Given   1413 2 mL Given    Radial Cocktail/Verapamil only (mL) Total volume:   10 mL  Date/Time Rate/Dose/Volume Action   05/11/20 1414 10 mL Given    heparin sodium (porcine) injection (Units) Total dose:  4,000 Units  Date/Time Rate/Dose/Volume Action   05/11/20 1430 4,000 Units Given    iohexol (OMNIPAQUE) 350 MG/ML injection (mL) Total volume:  45 mL  Date/Time Rate/Dose/Volume Action   05/11/20 1436 45 mL Given    Heparin (Porcine) in NaCl 1000-0.9 UT/500ML-% SOLN (mL) Total volume:  1,000 mL  Date/Time Rate/Dose/Volume Action   05/11/20 1437 500 mL Given   1437 500 mL Given    Sedation Time  Sedation Time Physician-1: 39 minutes 1 second   Contrast  Medication Name Total Dose  iohexol (OMNIPAQUE) 350 MG/ML injection 45 mL    Radiation/Fluoro  Fluoro time: 8.2 (min) DAP: 12898 (mGycm2) Cumulative Air Kerma: 222 (mGy)   Coronary Findings   Diagnostic Dominance: Right  Left Main  Vessel was injected. Vessel is normal in caliber. The vessel exhibits minimal luminal irregularities. The left main is widely patent with no significant obstruction.  Left Anterior Descending  The vessel exhibits minimal luminal irregularities. The LAD is patent with diffuse minimal irregularity but no stenosis. There are 2 diagonal branches with no stenoses.  Left Circumflex  The circumflex is widely patent. There is a single obtuse marginal branch with no obstruction.  Right Coronary Artery  Large, dominant vessel. Widely patent. No significant obstructive disease. The vessel supplies a large PDA branch.   Intervention   No interventions have been documented.  Right Heart  Right Heart Pressures LV EDP is normal.   Coronary Diagrams   Diagnostic Dominance: Right    Intervention    Implants    No implant documentation for this case.    Syngo Images  Show images for CARDIAC CATHETERIZATION  Images on Long Term Storage  Show images for Jaithan, Morlan to Procedure Log  Procedure Log     Hemo Data  Flowsheet Row  Most Recent Value  Fick Cardiac Output 5.43 L/min  Fick Cardiac Output Index 2.77 (L/min)/BSA  RA A Wave 4 mmHg  RA V Wave 2 mmHg  RA Mean 1 mmHg  RV Systolic Pressure 19 mmHg  RV Diastolic Pressure -3 mmHg  RV EDP 0 mmHg  PA Systolic Pressure 14 mmHg  PA Diastolic Pressure 1 mmHg  PA Mean 6 mmHg  PW A Wave 3 mmHg  PW V Wave 4 mmHg  PW Mean 2 mmHg  AO Systolic Pressure 123456 mmHg  AO Diastolic Pressure 66 mmHg  AO Mean 89 mmHg  LV Systolic Pressure XX123456 mmHg  LV Diastolic Pressure 4 mmHg  LV EDP 8 mmHg  AOp Systolic Pressure XX123456 mmHg  AOp Diastolic Pressure 66 mmHg  AOp Mean Pressure 87 mmHg  LVp Systolic Pressure 99991111 mmHg  LVp Diastolic Pressure 4 mmHg  LVp EDP Pressure 8 mmHg  QP/QS 1  TPVR Index 2.16 HRUI  TSVR Index 32.09 HRUI  PVR SVR Ratio 0.05  TPVR/TSVR Ratio 0.07      Impression:  Patient has mitral valve prolapse with stage C severe asymptomatic primary mitral regurgitation.  He remains reasonably active physically and specifically denies any symptoms of exertional shortness of breath or chest discomfort.  He has not had any palpitations, dizzy spells, nor other symptoms to suggest any history of atrial arrhythmias.  I have personally reviewed the patient's recent transthoracic and transesophageal echocardiograms, cardiac catheterization and CT angiograms.  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  There is some billowing and prolapse of the remainder of the P2 and P3 segments.  There does not appear to be any significant degree of calcification and overall leaflet mobility appears fairly good.  There is mild left atrial enlargement with normal left ventricular size and systolic function.  There are multiple jets of regurgitation and  the severity of mitral regurgitation appeared to be difficult to  quantify but in some views there is clear evidence of an eccentric jet of regurgitation that courses around most of the left atrium.  Diagnostic cardiac catheterization revealed normal coronary artery anatomy and no significant coronary artery disease.  Right heart pressures were normal.  CT angiography reveals no complicating features nor other contraindications to peripheral cannulation for surgery.  Options include continued close observation with frequent follow-up imaging versus elective mitral valve repair.  Based upon review of the patient's medical history and recent TEE I feel that there is greater than 95% likelihood that his valve could be repaired successfully with anticipated risk of mortality or significant morbidity less than 1%.   Plan:  The patient is interested in proceeding with elective mitral valve repair later this summer.  However, he is planning a trip to [place] the end of June and would not be interested in proceeding with surgery until after he has returned.  I explained that under the circumstances I will no longer be practicing in [place].  Under the circumstances we have offered to refer the patient to one of my partners or alternatively to a surgeon at [facility] who performs mitral valve repair via minimally invasive techniques.  All of his questions have been answered.    I spent in excess of 15 minutes during the conduct of this office consultation and >50% of this time involved direct face-to-face encounter with the patient for counseling and/or coordination of their care.    Valentina Gu. Roxy Manns, MD 05/16/2020 11:06 AM    Addendum:  We discussed the results of patient's CTA with regards to the 1.7 cm mass in the left kidney.  He will continue to follow up closely with his urologist.  Rexene Alberts, MD 05/16/2020 6:04 PM

## 2020-05-23 ENCOUNTER — Other Ambulatory Visit: Payer: BC Managed Care – PPO

## 2020-05-23 ENCOUNTER — Ambulatory Visit: Payer: BC Managed Care – PPO | Admitting: Thoracic Surgery (Cardiothoracic Vascular Surgery)

## 2020-06-21 DIAGNOSIS — D631 Anemia in chronic kidney disease: Secondary | ICD-10-CM | POA: Diagnosis not present

## 2020-06-21 DIAGNOSIS — Z905 Acquired absence of kidney: Secondary | ICD-10-CM | POA: Diagnosis not present

## 2020-06-21 DIAGNOSIS — N1831 Chronic kidney disease, stage 3a: Secondary | ICD-10-CM | POA: Diagnosis not present

## 2020-06-21 DIAGNOSIS — I129 Hypertensive chronic kidney disease with stage 1 through stage 4 chronic kidney disease, or unspecified chronic kidney disease: Secondary | ICD-10-CM | POA: Diagnosis not present

## 2020-07-07 DIAGNOSIS — I341 Nonrheumatic mitral (valve) prolapse: Secondary | ICD-10-CM | POA: Diagnosis not present

## 2020-07-07 DIAGNOSIS — I340 Nonrheumatic mitral (valve) insufficiency: Secondary | ICD-10-CM | POA: Diagnosis not present

## 2020-07-08 ENCOUNTER — Telehealth: Payer: Self-pay

## 2020-07-08 NOTE — Telephone Encounter (Signed)
NOTES ON FILE FROM [place] [facility] [phone] REFERRAL SENT TO SCHEDULING

## 2020-07-19 DIAGNOSIS — F3342 Major depressive disorder, recurrent, in full remission: Secondary | ICD-10-CM | POA: Diagnosis not present

## 2020-08-11 DIAGNOSIS — I4891 Unspecified atrial fibrillation: Secondary | ICD-10-CM | POA: Diagnosis not present

## 2020-08-11 DIAGNOSIS — I130 Hypertensive heart and chronic kidney disease with heart failure and stage 1 through stage 4 chronic kidney disease, or unspecified chronic kidney disease: Secondary | ICD-10-CM | POA: Diagnosis not present

## 2020-08-11 DIAGNOSIS — Z20822 Contact with and (suspected) exposure to covid-19: Secondary | ICD-10-CM | POA: Diagnosis not present

## 2020-08-11 DIAGNOSIS — I69354 Hemiplegia and hemiparesis following cerebral infarction affecting left non-dominant side: Secondary | ICD-10-CM | POA: Diagnosis not present

## 2020-08-11 DIAGNOSIS — Z4682 Encounter for fitting and adjustment of non-vascular catheter: Secondary | ICD-10-CM | POA: Diagnosis not present

## 2020-08-11 DIAGNOSIS — F32A Depression, unspecified: Secondary | ICD-10-CM | POA: Diagnosis not present

## 2020-08-11 DIAGNOSIS — I511 Rupture of chordae tendineae, not elsewhere classified: Secondary | ICD-10-CM | POA: Diagnosis not present

## 2020-08-11 DIAGNOSIS — J90 Pleural effusion, not elsewhere classified: Secondary | ICD-10-CM | POA: Diagnosis not present

## 2020-08-11 DIAGNOSIS — G8918 Other acute postprocedural pain: Secondary | ICD-10-CM | POA: Diagnosis not present

## 2020-08-11 DIAGNOSIS — I5032 Chronic diastolic (congestive) heart failure: Secondary | ICD-10-CM | POA: Diagnosis not present

## 2020-08-11 DIAGNOSIS — F419 Anxiety disorder, unspecified: Secondary | ICD-10-CM | POA: Diagnosis not present

## 2020-08-11 DIAGNOSIS — I517 Cardiomegaly: Secondary | ICD-10-CM | POA: Diagnosis not present

## 2020-08-11 DIAGNOSIS — E785 Hyperlipidemia, unspecified: Secondary | ICD-10-CM | POA: Diagnosis not present

## 2020-08-11 DIAGNOSIS — I351 Nonrheumatic aortic (valve) insufficiency: Secondary | ICD-10-CM | POA: Diagnosis not present

## 2020-08-11 DIAGNOSIS — I97190 Other postprocedural cardiac functional disturbances following cardiac surgery: Secondary | ICD-10-CM | POA: Diagnosis not present

## 2020-08-11 DIAGNOSIS — J9811 Atelectasis: Secondary | ICD-10-CM | POA: Diagnosis not present

## 2020-08-11 DIAGNOSIS — G479 Sleep disorder, unspecified: Secondary | ICD-10-CM | POA: Diagnosis not present

## 2020-08-11 DIAGNOSIS — J811 Chronic pulmonary edema: Secondary | ICD-10-CM | POA: Diagnosis not present

## 2020-08-11 DIAGNOSIS — N189 Chronic kidney disease, unspecified: Secondary | ICD-10-CM | POA: Diagnosis not present

## 2020-08-11 DIAGNOSIS — D696 Thrombocytopenia, unspecified: Secondary | ICD-10-CM | POA: Diagnosis not present

## 2020-08-11 DIAGNOSIS — D62 Acute posthemorrhagic anemia: Secondary | ICD-10-CM | POA: Diagnosis not present

## 2020-08-11 DIAGNOSIS — I341 Nonrheumatic mitral (valve) prolapse: Secondary | ICD-10-CM | POA: Diagnosis not present

## 2020-08-11 DIAGNOSIS — I340 Nonrheumatic mitral (valve) insufficiency: Secondary | ICD-10-CM | POA: Diagnosis not present

## 2020-08-16 ENCOUNTER — Other Ambulatory Visit: Payer: Self-pay | Admitting: *Deleted

## 2020-08-16 DIAGNOSIS — Z9889 Other specified postprocedural states: Secondary | ICD-10-CM

## 2020-08-29 ENCOUNTER — Telehealth: Payer: Self-pay | Admitting: Internal Medicine

## 2020-08-29 DIAGNOSIS — J90 Pleural effusion, not elsewhere classified: Secondary | ICD-10-CM | POA: Diagnosis not present

## 2020-08-29 DIAGNOSIS — I340 Nonrheumatic mitral (valve) insufficiency: Secondary | ICD-10-CM | POA: Diagnosis not present

## 2020-08-29 DIAGNOSIS — Z9889 Other specified postprocedural states: Secondary | ICD-10-CM | POA: Diagnosis not present

## 2020-08-29 NOTE — Telephone Encounter (Signed)
Patient states he recently had a surgery at [facility] 8/12 and was told he should follow up with his cardiologist. I did not see anything until February. He would like a call back from a nurse to see if he should be seen sooner.

## 2020-08-31 DIAGNOSIS — Z9889 Other specified postprocedural states: Secondary | ICD-10-CM | POA: Diagnosis not present

## 2020-08-31 DIAGNOSIS — D6869 Other thrombophilia: Secondary | ICD-10-CM | POA: Diagnosis not present

## 2020-08-31 DIAGNOSIS — I4891 Unspecified atrial fibrillation: Secondary | ICD-10-CM | POA: Diagnosis not present

## 2020-09-01 NOTE — Telephone Encounter (Signed)
Pt sent this Via Mychart to the schdling pool:    I have had a-fib since about 8/15, after surgery on 8/12.  No current symptoms.  I saw my GP yesterday.  BP was 104/60.  No current lightheadedness, though it has been fairly regular.  I have no history of a-fib or irregular heartbeat.  I have no other symptoms.    How long have you had Afib? Are you having the symptoms now?   Are you currently experiencing lightheadedness, SOB or CP?   Do you have a history of afib (atrial fibrillation) or irregular heart rhythm?   Have you checked your BP or HR? (document readings if available):   Are you experiencing any other symptoms?     Can you please fill this out, I can send this to our triage team and see if we cant get you can earlier appointment.   ----- Message -----      From:[name]      Sent:08/31/2020  4:54 PM EDT        [id number] Maurice Challenger, MD   Subject:Appointment Request  I would rather see Dr. Harrington [name], as I am in a-fib following a mitral valve repair, but if the PA is all I can get, I will take him or her.  This is very annoying.   ----- Message -----      From:Shana S      Sent:08/31/2020  4:44 PM EDT        [id number] [name]   Subject:Appointment Request  Good Afternoon [name],  As of right now Dr [name] does not have anything fairly soon.  Would you like to see one of her PA sooner?

## 2020-09-01 NOTE — Telephone Encounter (Signed)
Spoke to patient   he is feeling OK  Needs to have cardioversion set up    4 wks from Aug 15.   I would place on 13 or 14 of September  I should see him as a quick visit prior since he has only been seen at [facility] since surgery  Set up for add on  quick vist for H and P

## 2020-09-02 NOTE — Telephone Encounter (Signed)
Left message that he has an appointment 9/7 at 1:15 pm. Asked that he call back or MyChart to confirm he received the message and confirm he can come.

## 2020-09-05 NOTE — H&P (View-Only) (Signed)
Cardiology Office Note   Date:  09/07/2020   ID:  [name], DOB Feb 21, 1955, MRN [id number]  PCP:  Josetta Huddle, MD  Cardiologist:   Dorris Carnes, MD    F/U of mitral valve dz       History of Present Illness: [name] is a 65 y.o. 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 with resultant CVA and  then bleed    TEE was also  done showing thickened MV but no vegetation  Mild to mod MR  Prolapse noted  In 2017 the patient was admitted for sepsis  Echo/TEE done   No acute vegetation  MVP and MR noted    He was incidentally found to have a renal mass  and renal cell CA  Underwent nephrectomy in Feb 2018 He was seen in ED on 06/16/17   He got up to go to bathroom  Urinated   On getting up dizzy   Went to floor  Broke nose      I last saw the pt in Nov 2021  He went on to have a TTE in Dec 2021 and then TEE in Jan 2022 Results below   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 He has been seen on 08/29/20 for follow up [facility]   Rates controlled   Since seen the pt says his breathing is OK   he denies CP   Says overall he remains fatigued    Denies palpitaitons   Occaional dizziness with quick standing    No fevers     Outpatient Medications Prior to Visit  Medication Sig Dispense Refill   amiodarone (PACERONE) 200 MG tablet Take 200 mg by mouth daily.     amLODipine (NORVASC) 5 MG tablet Take 5 mg by mouth daily.     amoxicillin (AMOXIL) 500 MG capsule Take 2,000 mg by mouth as directed. 4 pills one hour before dental appointment     aspirin 81 MG chewable tablet Chew 1 tablet by mouth daily.     buPROPion (WELLBUTRIN XL) 150 MG 24 hr tablet Take 150 mg by mouth daily.     Cholecalciferol  (VITAMIN D3) 2000 units TABS Take 4,000 Units by mouth daily.     diphenhydramine-acetaminophen (TYLENOL PM) 25-500 MG TABS tablet Take 2 tablets by mouth at bedtime.     ELIQUIS 5 MG TABS tablet Take 1 tablet by mouth in the morning and at bedtime.     finasteride (PROSCAR) 5 MG tablet Take 1 tablet (5 mg total) by mouth daily. 30 tablet 0   losartan (COZAAR) 100 MG tablet Take 100 mg by mouth daily.     Melatonin 10 MG TABS Take 10 mg by mouth at bedtime.      Omega-3 Fatty Acids (FISH OIL PO) Take 400 mg by mouth daily.     Probiotic Product (PROBIOTIC DAILY PO) Take 1 tablet by mouth daily.     rosuvastatin (CRESTOR) 10 MG tablet TAKE 1 TABLET ONCE DAILY. (Patient taking differently: Take 10 mg by mouth daily.) 90 tablet 3   tamsulosin (FLOMAX) 0.4 MG CAPS capsule Take  0.4 mg by mouth daily.     No facility-administered medications prior to visit.     Allergies:   Losartan potassium   Past Medical History:  Diagnosis Date   Bacteremia 110/2017   Cancer ([place])    renal mass at preop   CVA (cerebral infarction)    hemmorhagic  CVA   Depression    Dyslipidemia    Endocarditis    MV Vegitation   History of kidney stones    Hypertension    Mitral regurgitation    Myocardial infarction [facility])    Stroke [facility])     Past Surgical History:  Procedure Laterality Date   NO PAST SURGERIES     RIGHT/LEFT HEART CATH AND CORONARY ANGIOGRAPHY N/A 05/11/2020   Procedure: RIGHT/LEFT HEART CATH AND CORONARY ANGIOGRAPHY;  Surgeon: Sherren Mocha, MD;  Location: [place] CV LAB;  Service: Cardiovascular;  Laterality: N/A;   ROBOT ASSISTED LAPAROSCOPIC NEPHRECTOMY Right 02/22/2016   Procedure: XI ROBOTIC ASSISTED LAPAROSCOPIC NEPHRECTOMY;  Surgeon: Alexis Frock, MD;  Location: [facility];  Service: Urology;  Laterality: Right;   TEE WITHOUT CARDIOVERSION N/A 01/28/2020   Procedure: TRANSESOPHAGEAL ECHOCARDIOGRAM (TEE);  Surgeon: Fay Records, MD;  Location: [facility] ENDOSCOPY;  Service:  Cardiovascular;  Laterality: N/A;   TONSILLECTOMY       Social History:  The patient  reports that he has never smoked. He has never used smokeless tobacco. He reports current alcohol use of about 7.0 standard drinks per week. He reports current drug use. Drug: Marijuana.   Family History:  The patient's family history includes Heart disease in his mother.    ROS:  Please see the history of present illness. All other systems are reviewed and  Negative to the above problem except as noted.    PHYSICAL EXAM: VS:  BP 118/70   Pulse 81   Ht '5\' 8"'$  (1.727 m)   Wt 86.3 kg   SpO2 99%   BMI 28.92 kg/m   GEN: Obese 65 yo in no acute distress  HEENT: normal  Neck: JVP normal  , carotid bruits, or masses Cardiac: RRR; No murmurs Chest   Lateral thoracotomy R OK   Small incisions for port with scab   One small incission under underarm.   Still healing   Does not appear infected   Respiratory:  clear to auscultation bilaterally,  GI: soft, nontender, nondistended, + BS  No hepatomegaly  MS: no deformity Moving all extremities   Skin: warm and dry, no rash Neuro:  Strength and sensation are intact Psych: euthymic mood, full affect   EKG:  EKG is ordered today.    Atrial fibrillation   81 bpm   RBBB   LAFB   TTE   (12/23/20)  1. Left ventricular ejection fraction, by estimation, is 55 to 60%. The left ventricle has normal function. The left ventricle has no regional wall motion abnormalities. Left ventricular diastolic parameters are consistent with Grade I diastolic dysfunction (impaired relaxation). Elevated left ventricular end-diastolic pressure. 2. Right ventricular systolic function is normal. The right ventricular size is normal. 3. Left atrial size was severely dilated. 4. The mitral valve is abnormal. Moderate to probably severe mitral valve regurgitation. There is moderate late systolic prolapse of multiple scallops of the posterior leaflet of the mitral valve. The regurgitant  jet hugs the atrial septum. 5. The aortic valve is tricuspid. Aortic valve regurgitation is mild and is directed toward the anterior mitral leaflet. 6. Aortic dilatation noted. There is mild dilatation  of the aortic root, measuring 40 mm. Comparison(s): Changes from prior study are noted. 10/15/2017: 55-60%, mild AI, posterior mitral leaflet prolapse with mild MR, moderate LAE. Conclusion(s)/Recommendation(s): Findings concerning for severe eccentric MR, would recommend Transesophageal Echocardiogram for clarification.   TEE  01/28/20  1. Left ventricular ejection fraction, by estimation, is 60 to 65%. The left ventricle has normal function. The left ventricle has no regional wall motion abnormalities. 2. Right ventricular systolic function is normal. The right ventricular size is normal. 3. Left atrial size was mildly dilated. No left atrial/left atrial appendage thrombus was detected. 4. The mitral valve is abnormal. Carpentier Type II mitral regurgitation. The P3 segment of the valve is partially flail, with prolapse of P2/P3. The mitral regurgitation is severe with a few jets, the most prominent being anteriorly direct, coursing to the back of the left atrium 5. The aortic valve is tricuspid. Aortic valve regurgitation is mild. It starts central and is posteriorly directed. 6. Mild fixed atherosclerotic plaquing in thoracic aorta.   L heart cath5/22  Conclusion  1.  Widely patent coronary arteries with minimal irregularity, right dominant 2.  Normal/low intracardiac filling pressures with no evidence of pulmonary hypertension 3.  Preserved cardiac output Lipid Panel    Component Value Date/Time   CHOL 151 12/13/2017 1147   TRIG 114 12/13/2017 1147   HDL 53 12/13/2017 1147   CHOLHDL 2.8 12/13/2017 1147   CHOLHDL 4 01/19/2010 0904   VLDL 22.4 01/19/2010 0904   LDLCALC 75 12/13/2017 1147   LDLDIRECT 164.1 01/19/2010 0904      Wt Readings from Last 3 Encounters:  09/07/20  86.3 kg  05/16/20 81.6 kg  05/11/20 81.6 kg      ASSESSMENT AND PLAN:  1  Mitral valve dz   Pt with hx of  endocarditis and mitral valve  prolapse   He is now about 1 month from MV repair at [facility]   Echo done there immed postop     On exam, no murmurs    Continue to follow    2  Atrial fibrillation   New since surgery   Anticoagulation was started on 8/15 along with Eliqis    Will set up for a cardioversion next week (a little over 4 wks from starting Eliquis  Follo after   Keep on amiodarone for now  3  HTN  BP is controlled  4  Dyslipidemai   Last LDL 71   HDL 62  Keep on same meds   Will set up for cardiac rehab after cardioversion    2  Hx dizziness  Pt with no further spells  Stay hydrated    3 HL   LDL 71  HDL 62  Trg 162   Keep on Crestor    4  Renal  Pt is s/p nephrectomy   Check BMET today  No LV gram at cath        Current medicines are reviewed at length with the patient today.  The patient does not have concerns regarding medicines.  Signed, Dorris Carnes, MD  09/07/2020 11:10 PM    [facility] [place], [place],   [address] Phone: [phone]; Fax: [phone]

## 2020-09-05 NOTE — Progress Notes (Signed)
Cardiology Office Note   Date:  09/07/2020   ID:  [name], DOB September 11, 1955, MRN [id number]  PCP:  Josetta Huddle, MD  Cardiologist:   Dorris Carnes, MD    F/U of mitral valve dz       History of Present Illness: [name] is a 65 y.o. 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 with resultant CVA and  then bleed    TEE was also  done showing thickened MV but no vegetation  Mild to mod MR  Prolapse noted  In 2017 the patient was admitted for sepsis  Echo/TEE done   No acute vegetation  MVP and MR noted    He was incidentally found to have a renal mass  and renal cell CA  Underwent nephrectomy in Feb 2018 He was seen in ED on 06/16/17   He got up to go to bathroom  Urinated   On getting up dizzy   Went to floor  Broke nose      I last saw the pt in Nov 2021  He went on to have a TTE in Dec 2021 and then TEE in Jan 2022 Results below   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 He has been seen on 08/29/20 for follow up at [facility]   Rates controlled   Since seen the pt says his breathing is OK   he denies CP   Says overall he remains fatigued    Denies palpitaitons   Occaional dizziness with quick standing    No fevers     Outpatient Medications Prior to Visit  Medication Sig Dispense Refill   amiodarone (PACERONE) 200 MG tablet Take 200 mg by mouth daily.     amLODipine (NORVASC) 5 MG tablet Take 5 mg by mouth daily.     amoxicillin (AMOXIL) 500 MG capsule Take 2,000 mg by mouth as directed. 4 pills one hour before dental appointment     aspirin 81 MG chewable tablet Chew 1 tablet by mouth daily.     buPROPion (WELLBUTRIN XL) 150 MG 24 hr tablet Take 150 mg by mouth daily.     Cholecalciferol  (VITAMIN D3) 2000 units TABS Take 4,000 Units by mouth daily.     diphenhydramine-acetaminophen (TYLENOL PM) 25-500 MG TABS tablet Take 2 tablets by mouth at bedtime.     ELIQUIS 5 MG TABS tablet Take 1 tablet by mouth in the morning and at bedtime.     finasteride (PROSCAR) 5 MG tablet Take 1 tablet (5 mg total) by mouth daily. 30 tablet 0   losartan (COZAAR) 100 MG tablet Take 100 mg by mouth daily.     Melatonin 10 MG TABS Take 10 mg by mouth at bedtime.      Omega-3 Fatty Acids (FISH OIL PO) Take 400 mg by mouth daily.     Probiotic Product (PROBIOTIC DAILY PO) Take 1 tablet by mouth daily.     rosuvastatin (CRESTOR) 10 MG tablet TAKE 1 TABLET ONCE DAILY. (Patient taking differently: Take 10 mg by mouth daily.) 90 tablet 3   tamsulosin (FLOMAX) 0.4 MG CAPS capsule Take  0.4 mg by mouth daily.     No facility-administered medications prior to visit.     Allergies:   Losartan potassium   Past Medical History:  Diagnosis Date   Bacteremia 110/2017   Cancer ([place])    renal mass at preop   CVA (cerebral infarction)    hemmorhagic  CVA   Depression    Dyslipidemia    Endocarditis    MV Vegitation   History of kidney stones    Hypertension    Mitral regurgitation    Myocardial infarction [facility])    Stroke [facility])     Past Surgical History:  Procedure Laterality Date   NO PAST SURGERIES     RIGHT/LEFT HEART CATH AND CORONARY ANGIOGRAPHY N/A 05/11/2020   Procedure: RIGHT/LEFT HEART CATH AND CORONARY ANGIOGRAPHY;  Surgeon: Sherren Mocha, MD;  Location: [place] CV LAB;  Service: Cardiovascular;  Laterality: N/A;   ROBOT ASSISTED LAPAROSCOPIC NEPHRECTOMY Right 02/22/2016   Procedure: XI ROBOTIC ASSISTED LAPAROSCOPIC NEPHRECTOMY;  Surgeon: Alexis Frock, MD;  Location: [facility];  Service: Urology;  Laterality: Right;   TEE WITHOUT CARDIOVERSION N/A 01/28/2020   Procedure: TRANSESOPHAGEAL ECHOCARDIOGRAM (TEE);  Surgeon: Fay Records, MD;  Location: [facility] ENDOSCOPY;  Service:  Cardiovascular;  Laterality: N/A;   TONSILLECTOMY       Social History:  The patient  reports that he has never smoked. He has never used smokeless tobacco. He reports current alcohol use of about 7.0 standard drinks per week. He reports current drug use. Drug: Marijuana.   Family History:  The patient's family history includes Heart disease in his mother.    ROS:  Please see the history of present illness. All other systems are reviewed and  Negative to the above problem except as noted.    PHYSICAL EXAM: VS:  BP 118/70   Pulse 81   Ht '5\' 8"'$  (1.727 m)   Wt 86.3 kg   SpO2 99%   BMI 28.92 kg/m   GEN: Obese 65 yo in no acute distress  HEENT: normal  Neck: JVP normal  , carotid bruits, or masses Cardiac: RRR; No murmurs Chest   Lateral thoracotomy R OK   Small incisions for port with scab   One small incission under underarm.   Still healing   Does not appear infected   Respiratory:  clear to auscultation bilaterally,  GI: soft, nontender, nondistended, + BS  No hepatomegaly  MS: no deformity Moving all extremities   Skin: warm and dry, no rash Neuro:  Strength and sensation are intact Psych: euthymic mood, full affect   EKG:  EKG is ordered today.    Atrial fibrillation   81 bpm   RBBB   LAFB   TTE   (12/23/20)  1. Left ventricular ejection fraction, by estimation, is 55 to 60%. The left ventricle has normal function. The left ventricle has no regional wall motion abnormalities. Left ventricular diastolic parameters are consistent with Grade I diastolic dysfunction (impaired relaxation). Elevated left ventricular end-diastolic pressure. 2. Right ventricular systolic function is normal. The right ventricular size is normal. 3. Left atrial size was severely dilated. 4. The mitral valve is abnormal. Moderate to probably severe mitral valve regurgitation. There is moderate late systolic prolapse of multiple scallops of the posterior leaflet of the mitral valve. The regurgitant  jet hugs the atrial septum. 5. The aortic valve is tricuspid. Aortic valve regurgitation is mild and is directed toward the anterior mitral leaflet. 6. Aortic dilatation noted. There is mild dilatation  of the aortic root, measuring 40 mm. Comparison(s): Changes from prior study are noted. 10/15/2017: 55-60%, mild AI, posterior mitral leaflet prolapse with mild MR, moderate LAE. Conclusion(s)/Recommendation(s): Findings concerning for severe eccentric MR, would recommend Transesophageal Echocardiogram for clarification.   TEE  01/28/20  1. Left ventricular ejection fraction, by estimation, is 60 to 65%. The left ventricle has normal function. The left ventricle has no regional wall motion abnormalities. 2. Right ventricular systolic function is normal. The right ventricular size is normal. 3. Left atrial size was mildly dilated. No left atrial/left atrial appendage thrombus was detected. 4. The mitral valve is abnormal. Carpentier Type II mitral regurgitation. The P3 segment of the valve is partially flail, with prolapse of P2/P3. The mitral regurgitation is severe with a few jets, the most prominent being anteriorly direct, coursing to the back of the left atrium 5. The aortic valve is tricuspid. Aortic valve regurgitation is mild. It starts central and is posteriorly directed. 6. Mild fixed atherosclerotic plaquing in thoracic aorta.   L heart cath5/22  Conclusion  1.  Widely patent coronary arteries with minimal irregularity, right dominant 2.  Normal/low intracardiac filling pressures with no evidence of pulmonary hypertension 3.  Preserved cardiac output Lipid Panel    Component Value Date/Time   CHOL 151 12/13/2017 1147   TRIG 114 12/13/2017 1147   HDL 53 12/13/2017 1147   CHOLHDL 2.8 12/13/2017 1147   CHOLHDL 4 01/19/2010 0904   VLDL 22.4 01/19/2010 0904   LDLCALC 75 12/13/2017 1147   LDLDIRECT 164.1 01/19/2010 0904      Wt Readings from Last 3 Encounters:  09/07/20  86.3 kg  05/16/20 81.6 kg  05/11/20 81.6 kg      ASSESSMENT AND PLAN:  1  Mitral valve dz   Pt with hx of  endocarditis and mitral valve  prolapse   He is now about 1 month from MV repair at [facility]   Echo done there immed postop     On exam, no murmurs    Continue to follow    2  Atrial fibrillation   New since surgery   Anticoagulation was started on 8/15 along with Eliqis    Will set up for a cardioversion next week (a little over 4 wks from starting Eliquis  Follo after   Keep on amiodarone for now  3  HTN  BP is controlled  4  Dyslipidemai   Last LDL 71   HDL 62  Keep on same meds   Will set up for cardiac rehab after cardioversion    2  Hx dizziness  Pt with no further spells  Stay hydrated    3 HL   LDL 71  HDL 62  Trg 162   Keep on Crestor    4  Renal  Pt is s/p nephrectomy   Check BMET today  No LV gram at cath        Current medicines are reviewed at length with the patient today.  The patient does not have concerns regarding medicines.  Signed, Dorris Carnes, MD  09/07/2020 11:10 PM    [facility] [place], [place], [place]  [address] Phone: [phone]; Fax: [phone]

## 2020-09-07 ENCOUNTER — Telehealth: Payer: Self-pay | Admitting: Internal Medicine

## 2020-09-07 ENCOUNTER — Encounter: Payer: Self-pay | Admitting: Internal Medicine

## 2020-09-07 ENCOUNTER — Ambulatory Visit (INDEPENDENT_AMBULATORY_CARE_PROVIDER_SITE_OTHER): Payer: BC Managed Care – PPO | Admitting: Internal Medicine

## 2020-09-07 ENCOUNTER — Other Ambulatory Visit: Payer: Self-pay

## 2020-09-07 VITALS — BP 118/70 | HR 81 | Ht 68.0 in | Wt 190.2 lb

## 2020-09-07 DIAGNOSIS — I480 Paroxysmal atrial fibrillation: Secondary | ICD-10-CM

## 2020-09-07 NOTE — Patient Instructions (Signed)
Medication Instructions:  No changes *If you need a refill on your cardiac medications before your next appointment, please call your pharmacy*   Lab Work: Today: bmet, cbc If you have labs (blood work) drawn today and your tests are completely normal, you will receive your results only by: [place] (if you have MyChart) OR A paper copy in the mail If you have any lab test that is abnormal or we need to change your treatment, we will call you to review the results.   Testing/Procedures: Your physician has recommended that you have a Cardioversion (DCCV). Electrical Cardioversion uses a jolt of electricity to your heart either through paddles or wired patches attached to your chest. This is a controlled, usually prescheduled, procedure. Defibrillation is done under light anesthesia in the hospital, and you usually go home the day of the procedure. This is done to get your heart back into a normal rhythm. You are not awake for the procedure. Please see the instruction sheet given to you today.   Other Instructions  You are scheduled for a Cardioversion on Wednesday, 09/14/20 with Dr. Margaretann Loveless.  Please arrive at the [facility] (Main Entrance A) at [facility]: [address] [place], [place] [address] at 8:30 am.  DIET: Nothing to eat or drink after midnight except a sip of water with medications (see medication instructions below)  FYI: For your safety, and to allow [place] to monitor your vital signs accurately during the surgery/procedure we request that   if you have artificial nails, gel coating, SNS etc. Please have those removed prior to your surgery/procedure. Not having the nail coverings /polish removed may result in cancellation or delay of your surgery/procedure.   Medication Instructions: Continue your anticoagulant: Eliquis  You will need to continue your anticoagulant after your procedure until you are told by your provider that it is safe to stop   Labs: BMET,  CBC 09/07/20  You must have a responsible person to drive you home and stay in the waiting area during your procedure. Failure to do so could result in cancellation.  Bring your insurance cards.  *Special Note: Every effort is made to have your procedure done on time. Occasionally there are emergencies that occur at the hospital that may cause delays. Please be patient if a delay does occur.

## 2020-09-07 NOTE — Telephone Encounter (Signed)
Please set up for cardiac rehab after cardioversion

## 2020-09-08 LAB — BASIC METABOLIC PANEL
BUN/Creatinine Ratio: 12 (ref 10–24)
BUN: 17 mg/dL (ref 8–27)
CO2: 21 mmol/L (ref 20–29)
Calcium: 9.5 mg/dL (ref 8.6–10.2)
Chloride: 105 mmol/L (ref 96–106)
Creatinine, Ser: 1.4 mg/dL — ABNORMAL HIGH (ref 0.76–1.27)
Glucose: 99 mg/dL (ref 65–99)
Potassium: 5 mmol/L (ref 3.5–5.2)
Sodium: 142 mmol/L (ref 134–144)
eGFR: 56 mL/min/{1.73_m2} — ABNORMAL LOW (ref >59)

## 2020-09-08 LAB — CBC
Hematocrit: 38.9 % (ref 37.5–51.0)
Hemoglobin: 12.7 g/dL — ABNORMAL LOW (ref 13.0–17.7)
MCH: 29.3 pg (ref 26.6–33.0)
MCHC: 32.6 g/dL (ref 31.5–35.7)
MCV: 90 fL (ref 79–97)
Platelets: 225 10*3/uL (ref 150–450)
RBC: 4.33 x10E6/uL (ref 4.14–5.80)
RDW: 13.7 % (ref 11.6–15.4)
WBC: 5.2 10*3/uL (ref 3.4–10.8)

## 2020-09-08 NOTE — Addendum Note (Signed)
Addended by: Rodman Key on: 09/08/2020 08:24 AM   Modules accepted: Orders

## 2020-09-08 NOTE — Telephone Encounter (Signed)
Order placed for cardiac rehab.

## 2020-09-09 ENCOUNTER — Telehealth (HOSPITAL_COMMUNITY): Payer: Self-pay

## 2020-09-09 ENCOUNTER — Encounter (HOSPITAL_COMMUNITY): Payer: Self-pay

## 2020-09-09 NOTE — Telephone Encounter (Signed)
Attempted to call patient in regards to Cardiac Rehab - LM on VM Mailed letter 

## 2020-09-14 ENCOUNTER — Ambulatory Visit (HOSPITAL_COMMUNITY): Payer: BC Managed Care – PPO | Admitting: Anesthesiology

## 2020-09-14 ENCOUNTER — Encounter (HOSPITAL_COMMUNITY): Payer: Self-pay | Admitting: Internal Medicine

## 2020-09-14 ENCOUNTER — Other Ambulatory Visit: Payer: Self-pay

## 2020-09-14 ENCOUNTER — Encounter (HOSPITAL_COMMUNITY)
Admission: RE | Disposition: A | Discharge status: Home / Self Care | Payer: Self-pay | Source: Home / Self Care | Attending: Internal Medicine

## 2020-09-14 ENCOUNTER — Ambulatory Visit (HOSPITAL_COMMUNITY)
Admission: RE | Admit: 2020-09-14 | Discharge: 2020-09-14 | Disposition: A | Discharge status: Home / Self Care | Payer: BC Managed Care – PPO | Attending: Internal Medicine | Admitting: Internal Medicine

## 2020-09-14 DIAGNOSIS — Z8249 Family history of ischemic heart disease and other diseases of the circulatory system: Secondary | ICD-10-CM | POA: Diagnosis not present

## 2020-09-14 DIAGNOSIS — F1290 Cannabis use, unspecified, uncomplicated: Secondary | ICD-10-CM | POA: Insufficient documentation

## 2020-09-14 DIAGNOSIS — Z7982 Long term (current) use of aspirin: Secondary | ICD-10-CM | POA: Insufficient documentation

## 2020-09-14 DIAGNOSIS — I4891 Unspecified atrial fibrillation: Secondary | ICD-10-CM | POA: Insufficient documentation

## 2020-09-14 DIAGNOSIS — J9690 Respiratory failure, unspecified, unspecified whether with hypoxia or hypercapnia: Secondary | ICD-10-CM | POA: Diagnosis not present

## 2020-09-14 DIAGNOSIS — I341 Nonrheumatic mitral (valve) prolapse: Secondary | ICD-10-CM | POA: Diagnosis not present

## 2020-09-14 DIAGNOSIS — I10 Essential (primary) hypertension: Secondary | ICD-10-CM | POA: Insufficient documentation

## 2020-09-14 DIAGNOSIS — Z79899 Other long term (current) drug therapy: Secondary | ICD-10-CM | POA: Insufficient documentation

## 2020-09-14 DIAGNOSIS — Z888 Allergy status to other drugs, medicaments and biological substances status: Secondary | ICD-10-CM | POA: Diagnosis not present

## 2020-09-14 DIAGNOSIS — I4819 Other persistent atrial fibrillation: Secondary | ICD-10-CM | POA: Diagnosis not present

## 2020-09-14 DIAGNOSIS — Z7901 Long term (current) use of anticoagulants: Secondary | ICD-10-CM | POA: Insufficient documentation

## 2020-09-14 DIAGNOSIS — Z905 Acquired absence of kidney: Secondary | ICD-10-CM | POA: Insufficient documentation

## 2020-09-14 DIAGNOSIS — E871 Hypo-osmolality and hyponatremia: Secondary | ICD-10-CM | POA: Diagnosis not present

## 2020-09-14 DIAGNOSIS — E785 Hyperlipidemia, unspecified: Secondary | ICD-10-CM | POA: Insufficient documentation

## 2020-09-14 DIAGNOSIS — Z7289 Other problems related to lifestyle: Secondary | ICD-10-CM | POA: Diagnosis not present

## 2020-09-14 HISTORY — PX: CARDIOVERSION: SHX1299

## 2020-09-14 SURGERY — CARDIOVERSION
Anesthesia: General

## 2020-09-14 MED ORDER — LIDOCAINE 2% (20 MG/ML) 5 ML SYRINGE
INTRAMUSCULAR | Status: DC | PRN
  Administered 2020-09-14: 40 mg via INTRAVENOUS

## 2020-09-14 MED ORDER — SODIUM CHLORIDE 0.9 % IV SOLN: INTRAVENOUS | Status: DC | PRN

## 2020-09-14 MED ORDER — PROPOFOL 10 MG/ML IV BOLUS
INTRAVENOUS | Status: DC | PRN
  Administered 2020-09-14: 20 mg via INTRAVENOUS
  Administered 2020-09-14: 60 mg via INTRAVENOUS

## 2020-09-14 NOTE — Anesthesia Postprocedure Evaluation (Signed)
Anesthesia Post Note  Patient: [name]  Procedure(s) Performed: CARDIOVERSION     Patient location during evaluation: PACU Anesthesia Type: General Level of consciousness: awake and alert Pain management: pain level controlled Vital Signs Assessment: post-procedure vital signs reviewed and stable Respiratory status: spontaneous breathing, nonlabored ventilation, respiratory function stable and patient connected to nasal cannula oxygen Cardiovascular status: blood pressure returned to baseline and stable Postop Assessment: no apparent nausea or vomiting Anesthetic complications: no   No notable events documented.  Last Vitals:  Vitals:   09/14/20 0923 09/14/20 0929  BP: 105/63 116/63  Pulse: 83 80  Resp: 13 13  Temp:    SpO2: 98% 98%    Last Pain:  Vitals:   09/14/20 0929  TempSrc:   PainSc: 0-No pain                 Effie Berkshire

## 2020-09-14 NOTE — Interval H&P Note (Signed)
History and Physical Interval Note:  09/14/2020 8:50 AM  [name]  has presented today for surgery, with the diagnosis of AFIB.  The various methods of treatment have been discussed with the patient and family. After consideration of risks, benefits and other options for treatment, the patient has consented to  Procedure(s): CARDIOVERSION (N/A) as a surgical intervention.  The patient's history has been reviewed, patient examined, no change in status, stable for surgery.  I have reviewed the patient's chart and labs.  Questions were answered to the patient's satisfaction.     Elouise Munroe

## 2020-09-14 NOTE — Progress Notes (Signed)
Patient should have follow up  in clinic in about 2 months  (mid Nov)

## 2020-09-14 NOTE — Anesthesia Preprocedure Evaluation (Addendum)
Anesthesia Evaluation  Patient identified by MRN, date of birth, ID band Patient awake    Reviewed: Allergy & Precautions, NPO status , Patient's Chart, lab work & pertinent test results  Airway Mallampati: III  TM Distance: >3 FB Neck ROM: Full    Dental  (+) Teeth Intact, Dental Advisory Given   Pulmonary    breath sounds clear to auscultation       Cardiovascular hypertension, + Past MI  + Valvular Problems/Murmurs MR  Rhythm:Regular Rate:Normal  Echo: 1. Left ventricular ejection fraction, by estimation, is 60 to 65%. The  left ventricle has normal function. The left ventricle has no regional  wall motion abnormalities.  2. Right ventricular systolic function is normal. The right ventricular  size is normal.  3. Left atrial size was mildly dilated. No left atrial/left atrial  appendage thrombus was detected.  4. The mitral valve is abnormal. Carpentier Type II mitral regurgitation.  The P3 segment of the valve is partially flail, with prolapse of P2/P3.  The mitral regurgitation is severe with a few jets, the most prominent  being anteriorly direct, coursing to  the back of the left atrium  5. The aortic valve is tricuspid. Aortic valve regurgitation is mild. It  starts central and is posteriorly directed.  6. Mild fixed atherosclerotic plaquing in thoracic aorta.    Neuro/Psych Seizures -,  PSYCHIATRIC DISORDERS Depression CVA    GI/Hepatic negative GI ROS, Neg liver ROS,   Endo/Other  negative endocrine ROS  Renal/GU Renal disease     Musculoskeletal negative musculoskeletal ROS (+)   Abdominal Normal abdominal exam  (+)   Peds  Hematology negative hematology ROS (+)   Anesthesia Other Findings   Reproductive/Obstetrics                            Anesthesia Physical Anesthesia Plan  ASA: 3  Anesthesia Plan: General   Post-op Pain Management:    Induction:  Intravenous  PONV Risk Score and Plan: 0  Airway Management Planned: Natural Airway and Simple Face Mask  Additional Equipment: None  Intra-op Plan:   Post-operative Plan:   Informed Consent: I have reviewed the patients History and Physical, chart, labs and discussed the procedure including the risks, benefits and alternatives for the proposed anesthesia with the patient or authorized representative who has indicated his/her understanding and acceptance.       Plan Discussed with: CRNA  Anesthesia Plan Comments:        Anesthesia Quick Evaluation

## 2020-09-14 NOTE — Discharge Instructions (Signed)
Electrical Cardioversion  Electrical cardioversion is the delivery of a jolt of electricity to restore a normal rhythm to the heart. A rhythm that is too fast or is not regular keeps the heart from pumping well. In this procedure, sticky patches or metal paddles are placed on the chest to deliver electricity to the heart from a device.  What can I expect after the procedure?  Your blood pressure, heart rate, breathing rate, and blood oxygen level will be monitored until you leave the hospital or clinic.  Your heart rhythm will be watched to make sure it does not change.  You may have some redness on the skin where the shocks were given.If this occurs, can use hydrocortisone cream or Aloe vera.  Follow these instructions at home:  Do not drive for 24 hours if you were given a sedative during your procedure.  Take over-the-counter and prescription medicines only as told by your health care provider.  Ask your health care provider how to check your pulse. Check it often.  Rest for 48 hours after the procedure or as told by your health care provider.  Avoid or limit your caffeine use as told by your health care provider.  Keep all follow-up visits as told by your health care provider. This is important.  Contact a health care provider if:  You feel like your heart is beating too quickly or your pulse is not regular.  You have a serious muscle cramp that does not go away.  Get help right away if:  You have discomfort in your chest.  You are dizzy or you feel faint.  You have trouble breathing or you are short of breath.  Your speech is slurred.  You have trouble moving an arm or leg on one side of your body.  Your fingers or toes turn cold or blue.  Summary  Electrical cardioversion is the delivery of a jolt of electricity to restore a normal rhythm to the heart.  This procedure may be done right away in an emergency or may be a scheduled procedure if the condition is not  an emergency.  Generally, this is a safe procedure.  After the procedure, check your pulse often as told by your health care provider.  This information is not intended to replace advice given to you by your health care provider. Make sure you discuss any questions you have with your health care provider. Document Revised: 07/21/2018 Document Reviewed: 07/21/2018 Elsevier Patient Education  2021 Elsevier Inc.  

## 2020-09-14 NOTE — CV Procedure (Signed)
Procedure: Electrical Cardioversion Indications:  Atrial Fibrillation  Procedure Details:  Consent: Risks of procedure as well as the alternatives and risks of each were explained to the (patient/caregiver).  Consent for procedure obtained.  Time Out: Verified patient identification, verified procedure, site/side was marked, verified correct patient position, special equipment/implants available, medications/allergies/relevent history reviewed, required imaging and test results available. PERFORMED.  Patient placed on cardiac monitor, pulse oximetry, supplemental oxygen as necessary.  Sedation given:  propofol per anesthesia Pacer pads placed anterior and posterior chest.  Cardioverted 1 time(s).  Cardioversion with synchronized biphasic 120J shock.  Evaluation: Findings: Post procedure EKG shows: NSR Complications: None Patient did tolerate procedure well.  Time Spent Directly with the Patient:  30 minutes   Elouise Munroe 09/14/2020, 9:14 AM

## 2020-09-14 NOTE — Transfer of Care (Signed)
Immediate Anesthesia Transfer of Care Note  Patient: [name]  Procedure(s) Performed: CARDIOVERSION  Patient Location: Endoscopy Unit  Anesthesia Type:MAC  Level of Consciousness: awake  Airway & Oxygen Therapy: Patient Spontanous Breathing  Post-op Assessment: Report given to RN and Post -op Vital signs reviewed and stable  Post vital signs: Reviewed and stable  Last Vitals:  Vitals Value Taken Time  BP    Temp    Pulse    Resp    SpO2      Last Pain:  Vitals:   09/14/20 0852  TempSrc: Oral  PainSc: 0-No pain         Complications: No notable events documented.

## 2020-09-15 ENCOUNTER — Telehealth: Payer: Self-pay | Admitting: *Deleted

## 2020-09-15 NOTE — Telephone Encounter (Signed)
Message from [name] that pt needs a return visit to see her in mid November for follow up from [place]. Messaged pt to offier 11/11/20 at 2:00 pm

## 2020-09-15 NOTE — Telephone Encounter (Signed)
Pt scheduled for 11/11/20.

## 2020-09-15 NOTE — Telephone Encounter (Signed)
-----   Message from Fay Records, MD sent at 09/14/2020  8:50 PM EDT -----    ----- Message ----- From: Elouise Munroe, MD Sent: 09/14/2020   9:19 AM EDT To: Fay Records, MD  Successful cardioversion 1 shock. In SR now with HR in 80s. Thanks! Smitty Cords

## 2020-09-16 NOTE — Telephone Encounter (Signed)
Pt called back and stated that he is interested in the cardiac rehab program, I advised pt that we have a 1-3 month backlog and that the RN has to reviewed his referral and notes before we can get him scheduled. Pt understood.

## 2020-09-16 NOTE — Telephone Encounter (Signed)
Pt insurance is active and benefits verified through [employer] Co-pay 0, DED $7,000/$7,000 met, out of pocket $7,000/$7,000 met, co-insurance 0%. no pre-authorization required. Lori/[employer] 09/16/2020_0 :54pm, REF# [id number]   Will contact patient to see if he is interested in the Cardiac Rehab Program. If interested, patient will need to complete follow up appt. Once completed, patient will be contacted for scheduling upon review by the RN Navigator.

## 2020-09-19 ENCOUNTER — Encounter (HOSPITAL_COMMUNITY): Payer: Self-pay | Admitting: *Deleted

## 2020-09-19 NOTE — Progress Notes (Signed)
Received referral from [name] for this pt to participate in Cardiac Rehab s/p 08/12/20 [facility] at [facility] by Dr. Adelene Idler. Clinical review of pt follow up appt on 9/7 with [name] - cardiologist office note. Pt completed cardioversion from Afib to NSR on 9/14.  Pt to follow up with [name] on 11/11.  Also reviewed follow up office visit with Dr. Cheree Ditto in care everywhere. Pt is making the expected progress in recovery.  Pt appropriate for scheduling for on site cardiac rehab and/or enrollment in Virtual Cardiac Rehab.  Pt Covid Risk Score is 3. Will forward to staff for follow up. Cherre Huger, BSN Cardiac and Training and development officer

## 2020-10-19 DIAGNOSIS — N390 Urinary tract infection, site not specified: Secondary | ICD-10-CM | POA: Diagnosis not present

## 2020-10-31 DIAGNOSIS — M25569 Pain in unspecified knee: Secondary | ICD-10-CM | POA: Diagnosis not present

## 2020-10-31 DIAGNOSIS — G4700 Insomnia, unspecified: Secondary | ICD-10-CM | POA: Diagnosis not present

## 2020-10-31 DIAGNOSIS — I4891 Unspecified atrial fibrillation: Secondary | ICD-10-CM | POA: Diagnosis not present

## 2020-10-31 DIAGNOSIS — D6869 Other thrombophilia: Secondary | ICD-10-CM | POA: Diagnosis not present

## 2020-11-10 NOTE — Progress Notes (Signed)
Cardiology Office Note   Date:  11/11/2020   ID:  [name], DOB 1955/12/04, MRN [id number]  PCP:  Josetta Huddle, MD  Cardiologist:   Dorris Carnes, MD    F/U of mitral valve dz       History of Present Illness: [name] is a 65 y.o. 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 He has been seen on 08/29/20 for follow up at [facility] in afib   Rates controlled    He underwent cardioversion on Sept 14 2022  Since then he has done well   He stopped amiodarone a couple weeks ago   He denies CP   Breathing is OK  No dizziness  No palpitations    He did not hear from cardiac rehab yet  Wants to start running    Outpatient Medications Prior to Visit  Medication Sig Dispense Refill   amiodarone (PACERONE) 200 MG tablet Take 200 mg by mouth daily.     amoxicillin (AMOXIL) 500 MG capsule Take 2,000 mg by mouth as directed. 4 pills one hour before dental appointment     aspirin 81 MG chewable tablet Chew 81 mg by mouth daily.     buPROPion (WELLBUTRIN XL) 150 MG 24 hr tablet Take 150 mg by mouth daily.     cetirizine (ZYRTEC) 10 MG tablet Take 10 mg by mouth daily as needed for allergies.     cholecalciferol (VITAMIN D3) 25 MCG (1000  UNIT) tablet Take 1,000 Units by mouth daily.     diphenhydramine-acetaminophen (TYLENOL PM) 25-500 MG TABS tablet Take 2 tablets by mouth at bedtime.     ELIQUIS 5 MG TABS tablet Take 5 mg by mouth 2 (two) times daily.     finasteride (PROSCAR) 5 MG tablet Take 1 tablet (5 mg total) by mouth daily. 30 tablet 0   losartan (COZAAR) 100 MG tablet Take 50 mg by mouth daily.     Melatonin 10 MG TABS Take 10 mg by mouth at bedtime.      oxymetazoline (AFRIN) 0.05 % nasal spray Place 1 spray into both nostrils 2 (two) times daily as needed for congestion.     Probiotic Product (PROBIOTIC DAILY PO) Take 1 tablet by mouth daily.     rosuvastatin (CRESTOR) 10 MG tablet TAKE 1 TABLET ONCE DAILY. (Patient taking differently: Take 10 mg by mouth daily.) 90 tablet 3   tamsulosin (FLOMAX) 0.4 MG CAPS capsule Take 0.4 mg by  mouth daily.     amLODipine (NORVASC) 5 MG tablet Take 5 mg by mouth daily.     traZODone (DESYREL) 50 MG tablet Take 50-100 mg by mouth at bedtime.     No facility-administered medications prior to visit.     Allergies:   Patient has no known allergies.   Past Medical History:  Diagnosis Date   Bacteremia 110/2017   Cancer [facility])    renal mass at preop   CVA (cerebral infarction)    hemmorhagic  CVA   Depression    Dyslipidemia    Endocarditis    MV Vegitation   History of kidney stones    Hypertension    Mitral regurgitation    Myocardial infarction [facility])    Stroke [name])     Past Surgical History:  Procedure Laterality Date   CARDIOVERSION N/A 09/14/2020   Procedure: CARDIOVERSION;  Surgeon: Elouise Munroe, MD;  Location: [place];  Service: Cardiovascular;  Laterality: N/A;   NO PAST SURGERIES     RIGHT/LEFT HEART CATH AND CORONARY ANGIOGRAPHY N/A 05/11/2020   Procedure: RIGHT/LEFT HEART CATH AND CORONARY ANGIOGRAPHY;  Surgeon: Sherren Mocha, MD;  Location: [facility] CV LAB;  Service: Cardiovascular;  Laterality: N/A;   ROBOT ASSISTED LAPAROSCOPIC  NEPHRECTOMY Right 02/22/2016   Procedure: XI ROBOTIC ASSISTED LAPAROSCOPIC NEPHRECTOMY;  Surgeon: Alexis Frock, MD;  Location: [facility] ORS;  Service: Urology;  Laterality: Right;   TEE WITHOUT CARDIOVERSION N/A 01/28/2020   Procedure: TRANSESOPHAGEAL ECHOCARDIOGRAM (TEE);  Surgeon: Fay Records, MD;  [facility] ENDOSCOPY;  Service: Cardiovascular;  Laterality: N/A;   TONSILLECTOMY       Social History:  The patient  reports that he has never smoked. He has never used smokeless tobacco. He reports current alcohol use of about 7.0 standard drinks per week. He reports current drug use. Drug: Marijuana.   Family History:  The patient's family history includes Heart disease in his mother.    ROS:  Please see the history of present illness. All other systems are reviewed and  Negative to the above problem except as noted.    PHYSICAL EXAM: VS:  BP 108/66   Pulse 96   Ht 5\' 8"  (1.727 m)   Wt 187 lb 6.4 oz (85 kg)   SpO2 99%   BMI 28.49 kg/m   GEN: Obese 65 yo in no acute distress  HEENT: normal  Neck: JVP normal  , carotid bruits Cardiac: RRR; No murmurs Chest   Lateral thoracotomy R scab   Healing well   Respiratory:  clear to auscultation bilaterally,  GI: soft, nontender, nondistended, + BS  No hepatomegaly  MS: no deformity Moving all extremities   Skin: warm and dry, no rash Neuro:  Strength and sensation are intact Psych: euthymic mood, full affect   EKG:  EKG is ordered today.  SR 92 bpm  RBBB   First degree AV block  TTE 1. Left ventricular ejection fraction, by estimation, is 55 to 60%. The left ventricle has normal function. The left ventricle has no regional wall motion abnormalities. Left ventricular diastolic parameters are consistent with Grade I diastolic dysfunction (impaired relaxation). Elevated left ventricular end-diastolic pressure. 2. Right ventricular systolic function is normal. The right ventricular size is normal. 3. Left atrial size was severely  dilated. 4. The mitral valve is abnormal. Moderate to probably severe mitral valve regurgitation. There is moderate late systolic prolapse of multiple scallops of the posterior leaflet of the mitral valve. The regurgitant jet hugs the  atrial septum. 5. The aortic valve is tricuspid. Aortic valve regurgitation is mild and is directed toward the anterior mitral leaflet. 6. Aortic dilatation noted. There is mild dilatation of the aortic root, measuring 40 mm. Comparison(s): Changes from prior study are noted. 10/15/2017: 55-60%, mild AI, posterior mitral leaflet prolapse with mild MR, moderate LAE. Conclusion(s)/Recommendation(s): Findings concerning for severe eccentric MR, would recommend Transesophageal Echocardiogram for clarification.   TEE  01/28/20  1. Left ventricular ejection fraction, by estimation, is 60 to 65%. The left ventricle has normal function. The left ventricle has no regional wall motion abnormalities. 2. Right ventricular systolic function is normal. The right ventricular size is normal. 3. Left atrial size was mildly dilated. No left atrial/left atrial appendage thrombus was detected. 4. The mitral valve is abnormal. Carpentier Type II mitral regurgitation. The P3 segment of the valve is partially flail, with prolapse of P2/P3. The mitral regurgitation is severe with a few jets, the most prominent being anteriorly direct, coursing to the back of the left atrium 5. The aortic valve is tricuspid. Aortic valve regurgitation is mild. It starts central and is posteriorly directed. 6. Mild fixed atherosclerotic plaquing in thoracic aorta.   L heart cath5/22  Conclusion  1.  Widely patent coronary arteries with minimal irregularity, right dominant 2.  Normal/low intracardiac filling pressures with no evidence of pulmonary hypertension 3.  Preserved cardiac output Lipid Panel    Component Value Date/Time   CHOL 151 12/13/2017 1147   TRIG 114 12/13/2017 1147   HDL 53  12/13/2017 1147   CHOLHDL 2.8 12/13/2017 1147   CHOLHDL 4 01/19/2010 0904   VLDL 22.4 01/19/2010 0904   LDLCALC 75 12/13/2017 1147   LDLDIRECT 164.1 01/19/2010 0904      Wt Readings from Last 3 Encounters:  11/11/20 187 lb 6.4 oz (85 kg)  09/07/20 190 lb 3.2 oz (86.3 kg)  05/16/20 180 lb (81.6 kg)      ASSESSMENT AND PLAN:  1  Atrial fib   Pt remains in SR post cardioversion   he is off off amiodarone for a couple weeks    I would keep on Eliquis for now     Review records re time to stop  2  MV dz   Pt s/p MV repair at [facility]   no murmur on exam  Doing well   Will refer to cardiac rehab  3  HTN  BP is a little low   He was a little dizzy   Decrease amlodipine to 2.5 mg   Follow bp    4  Dyslipidemai   Last LDL 71   HDL 62  Keep on same meds      4  Renal  Pt is s/p nephrectomy         Current medicines are reviewed at length with the patient today.  The patient does not have concerns regarding medicines.  Signed, Dorris Carnes, MD  11/11/2020 9:35 PM    [facility] [address], [place], [place]  [address] Phone: [phone]; Fax: [phone]

## 2020-11-11 ENCOUNTER — Ambulatory Visit (INDEPENDENT_AMBULATORY_CARE_PROVIDER_SITE_OTHER): Payer: BC Managed Care – PPO | Admitting: Internal Medicine

## 2020-11-11 ENCOUNTER — Other Ambulatory Visit: Payer: Self-pay

## 2020-11-11 ENCOUNTER — Encounter: Payer: Self-pay | Admitting: Internal Medicine

## 2020-11-11 VITALS — BP 108/66 | HR 96 | Ht 68.0 in | Wt 187.4 lb

## 2020-11-11 DIAGNOSIS — I340 Nonrheumatic mitral (valve) insufficiency: Secondary | ICD-10-CM

## 2020-11-11 DIAGNOSIS — I341 Nonrheumatic mitral (valve) prolapse: Secondary | ICD-10-CM

## 2020-11-11 MED ORDER — AMLODIPINE BESYLATE 2.5 MG PO TABS: 2.5000 mg | ORAL_TABLET | Freq: Every day | ORAL | 1 refills | Status: DC

## 2020-11-11 NOTE — Patient Instructions (Signed)
Medication Instructions:   START TAKING AMLODIPINE 2.5 MG ONCE A  DAY   STOP TAKING  TRAZODONE    *If you need a refill on your cardiac medications before your next appointment, please call your pharmacy*   Lab Work: NONE ORDERED  TODAY    If you have labs (blood work) drawn today and your tests are completely normal, you will receive your results only by: [facility] (if you have MyChart) OR A paper copy in the mail If you have any lab test that is abnormal or we need to change your treatment, we will call you to review the results.   Testing/Procedures: NONE ORDERED  TODAY    Follow-Up: At [facility], you and your health needs are our priority.  As part of our continuing mission to provide you with exceptional heart care, we have created designated Provider Care Teams.  These Care Teams include your primary Cardiologist (physician) and Advanced Practice Providers (APPs -  Physician Assistants and Nurse Practitioners) who all work together to provide you with the care you need, when you need it.  We recommend signing up for the patient portal called "MyChart".  Sign up information is provided on this After Visit Summary.  MyChart is used to connect with patients for Virtual Visits (Telemedicine).  Patients are able to view lab/test results, encounter notes, upcoming appointments, etc.  Non-urgent messages can be sent to your provider as well.   To learn more about what you can do with MyChart, go to [email or web address].    Your next appointment:  YOU WILL BE CONTACTED BACK FOR A FOLLOW UP DATE WITH DR ROSS   YOU HAVE BEEN REFERRED TO CARDIAC REHABILITATION SOMEONE WILL CONTACT YOU FROM THAT DEPARTMENT    Other Instructions

## 2020-11-14 DIAGNOSIS — M2242 Chondromalacia patellae, left knee: Secondary | ICD-10-CM | POA: Diagnosis not present

## 2020-11-14 DIAGNOSIS — M2241 Chondromalacia patellae, right knee: Secondary | ICD-10-CM | POA: Diagnosis not present

## 2020-11-14 MED ORDER — APIXABAN 5 MG PO TABS: 5.0000 mg | ORAL_TABLET | Freq: Two times a day (BID) | ORAL | 5 refills | Status: DC

## 2020-11-15 ENCOUNTER — Encounter (HOSPITAL_COMMUNITY): Payer: Self-pay

## 2020-11-15 ENCOUNTER — Telehealth (HOSPITAL_COMMUNITY): Payer: Self-pay

## 2020-11-15 ENCOUNTER — Encounter: Payer: Self-pay | Admitting: *Deleted

## 2020-11-15 ENCOUNTER — Other Ambulatory Visit: Payer: Self-pay | Admitting: *Deleted

## 2020-11-15 MED ORDER — APIXABAN 5 MG PO TABS: 5.0000 mg | ORAL_TABLET | Freq: Two times a day (BID) | ORAL | 5 refills | Status: DC

## 2020-11-15 MED ORDER — AMOXICILLIN 500 MG PO CAPS: 2000.0000 mg | ORAL_CAPSULE | ORAL | 2 refills | Status: AC

## 2020-11-15 NOTE — Telephone Encounter (Signed)
Attempted to call patient in regards to Cardiac Rehab - LM on VM Mailed letter 

## 2020-11-15 NOTE — Addendum Note (Signed)
Addended by: Marcelle Overlie D on: 11/15/2020 11:29 AM   Modules accepted: Orders

## 2020-11-21 DIAGNOSIS — M6281 Muscle weakness (generalized): Secondary | ICD-10-CM | POA: Diagnosis not present

## 2020-11-21 DIAGNOSIS — M94261 Chondromalacia, right knee: Secondary | ICD-10-CM | POA: Diagnosis not present

## 2020-11-21 DIAGNOSIS — R262 Difficulty in walking, not elsewhere classified: Secondary | ICD-10-CM | POA: Diagnosis not present

## 2020-11-21 DIAGNOSIS — M94262 Chondromalacia, left knee: Secondary | ICD-10-CM | POA: Diagnosis not present

## 2020-11-29 DIAGNOSIS — M94262 Chondromalacia, left knee: Secondary | ICD-10-CM | POA: Diagnosis not present

## 2020-11-29 DIAGNOSIS — M94261 Chondromalacia, right knee: Secondary | ICD-10-CM | POA: Diagnosis not present

## 2020-11-29 DIAGNOSIS — M6281 Muscle weakness (generalized): Secondary | ICD-10-CM | POA: Diagnosis not present

## 2020-11-29 DIAGNOSIS — R262 Difficulty in walking, not elsewhere classified: Secondary | ICD-10-CM | POA: Diagnosis not present

## 2020-12-02 DIAGNOSIS — M94261 Chondromalacia, right knee: Secondary | ICD-10-CM | POA: Diagnosis not present

## 2020-12-02 DIAGNOSIS — M6281 Muscle weakness (generalized): Secondary | ICD-10-CM | POA: Diagnosis not present

## 2020-12-02 DIAGNOSIS — M94262 Chondromalacia, left knee: Secondary | ICD-10-CM | POA: Diagnosis not present

## 2020-12-02 DIAGNOSIS — R262 Difficulty in walking, not elsewhere classified: Secondary | ICD-10-CM | POA: Diagnosis not present

## 2020-12-06 DIAGNOSIS — M94261 Chondromalacia, right knee: Secondary | ICD-10-CM | POA: Diagnosis not present

## 2020-12-06 DIAGNOSIS — M6281 Muscle weakness (generalized): Secondary | ICD-10-CM | POA: Diagnosis not present

## 2020-12-06 DIAGNOSIS — M94262 Chondromalacia, left knee: Secondary | ICD-10-CM | POA: Diagnosis not present

## 2020-12-06 DIAGNOSIS — R262 Difficulty in walking, not elsewhere classified: Secondary | ICD-10-CM | POA: Diagnosis not present

## 2020-12-08 DIAGNOSIS — M94261 Chondromalacia, right knee: Secondary | ICD-10-CM | POA: Diagnosis not present

## 2020-12-08 DIAGNOSIS — M6281 Muscle weakness (generalized): Secondary | ICD-10-CM | POA: Diagnosis not present

## 2020-12-08 DIAGNOSIS — R262 Difficulty in walking, not elsewhere classified: Secondary | ICD-10-CM | POA: Diagnosis not present

## 2020-12-08 DIAGNOSIS — M94262 Chondromalacia, left knee: Secondary | ICD-10-CM | POA: Diagnosis not present

## 2020-12-09 ENCOUNTER — Encounter: Payer: Self-pay | Admitting: Internal Medicine

## 2020-12-09 MED ORDER — ROSUVASTATIN CALCIUM 10 MG PO TABS: 10.0000 mg | ORAL_TABLET | Freq: Every day | ORAL | 3 refills | Status: DC

## 2020-12-12 ENCOUNTER — Telehealth (HOSPITAL_COMMUNITY): Payer: Self-pay

## 2020-12-12 DIAGNOSIS — M94262 Chondromalacia, left knee: Secondary | ICD-10-CM | POA: Diagnosis not present

## 2020-12-12 DIAGNOSIS — M94261 Chondromalacia, right knee: Secondary | ICD-10-CM | POA: Diagnosis not present

## 2020-12-13 ENCOUNTER — Other Ambulatory Visit: Payer: Self-pay

## 2020-12-13 ENCOUNTER — Encounter (HOSPITAL_COMMUNITY)
Admission: RE | Admit: 2020-12-13 | Discharge: 2020-12-13 | Disposition: A | Discharge status: Home / Self Care | Payer: BC Managed Care – PPO | Source: Ambulatory Visit | Attending: Internal Medicine | Admitting: Internal Medicine

## 2020-12-13 ENCOUNTER — Encounter (HOSPITAL_COMMUNITY): Payer: Self-pay

## 2020-12-13 VITALS — BP 102/64 | HR 95 | Ht 67.75 in | Wt 194.2 lb

## 2020-12-13 DIAGNOSIS — Z9889 Other specified postprocedural states: Secondary | ICD-10-CM | POA: Insufficient documentation

## 2020-12-13 DIAGNOSIS — R262 Difficulty in walking, not elsewhere classified: Secondary | ICD-10-CM | POA: Diagnosis not present

## 2020-12-13 DIAGNOSIS — M94261 Chondromalacia, right knee: Secondary | ICD-10-CM | POA: Diagnosis not present

## 2020-12-13 DIAGNOSIS — M94262 Chondromalacia, left knee: Secondary | ICD-10-CM | POA: Diagnosis not present

## 2020-12-13 DIAGNOSIS — M6281 Muscle weakness (generalized): Secondary | ICD-10-CM | POA: Diagnosis not present

## 2020-12-13 NOTE — Progress Notes (Signed)
Cardiac Rehab Medication Review by a Nurse  Does the patient  feel that his/her medications are working for him/her?  yes  Has the patient been experiencing any side effects to the medications prescribed?  no  Does the patient measure his/her own blood pressure or blood glucose at home?  no   Does the patient have any problems obtaining medications due to transportation or finances?   no  Understanding of regimen: good Understanding of indications: good Potential of compliance: excellent    Nurse comments: [name] is taking his medications as prescribed and has a good understanding of what his medications are for. [name] does not have a blood pressure cuff at home he does have a pulse oximeter and occasionally checks his oxygen saturation and pulse.    [name] [name] Maurice Isley RN 12/13/2020 1:25 PM

## 2020-12-13 NOTE — Progress Notes (Signed)
Cardiac Individual Treatment Plan  Patient Details  Name: [name] MRN: [id number] Date of Birth: [birth year] Referring Provider:   Flowsheet Row CARDIAC REHAB PHASE II ORIENTATION from 12/13/2020 in [facility]  Referring Provider Dr Dorris Carnes, MD       Initial Encounter Date:  [place] from 12/13/2020 in [facility]  Date 12/13/20       Visit Diagnosis: 08/12/20 S/P mitral valve repair Dr Cheree Ditto at [facility]  Patient's Home Medications on Admission:  Current Outpatient Medications:    amLODipine (NORVASC) 2.5 MG tablet, Take 1 tablet (2.5 mg total) by mouth daily., Disp: 90 tablet, Rfl: 1   apixaban (ELIQUIS) 5 MG TABS tablet, Take 1 tablet (5 mg total) by mouth 2 (two) times daily., Disp: 60 tablet, Rfl: 5   aspirin 81 MG chewable tablet, Chew 81 mg by mouth daily., Disp: , Rfl:    buPROPion (WELLBUTRIN XL) 150 MG 24 hr tablet, Take 150 mg by mouth daily., Disp: , Rfl:    cetirizine (ZYRTEC) 10 MG tablet, Take 10 mg by mouth daily as needed for allergies., Disp: , Rfl:    cholecalciferol (VITAMIN D3) 25 MCG (1000 UNIT) tablet, Take 1,000 Units by mouth daily., Disp: , Rfl:    diphenhydramine-acetaminophen (TYLENOL PM) 25-500 MG TABS tablet, Take 2 tablets by mouth at bedtime., Disp: , Rfl:    finasteride (PROSCAR) 5 MG tablet, Take 1 tablet (5 mg total) by mouth daily., Disp: 30 tablet, Rfl: 0   losartan (COZAAR) 50 MG tablet, Take 50 mg by mouth daily., Disp: , Rfl:    Melatonin 10 MG TABS, Take 10 mg by mouth at bedtime. , Disp: , Rfl:    oxymetazoline (AFRIN) 0.05 % nasal spray, Place 1 spray into both nostrils 2 (two) times daily as needed for congestion., Disp: , Rfl:    Probiotic Product (PROBIOTIC DAILY PO), Take 1 tablet by mouth daily., Disp: , Rfl:    tamsulosin (FLOMAX) 0.4 MG CAPS capsule, Take 0.4 mg by mouth daily., Disp: , Rfl:     amoxicillin (AMOXIL) 500 MG capsule, Take 4 capsules (2,000 mg total) by mouth as directed. 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[place]   History of kidney stones    Hypertension    Mitral regurgitation    Myocardial infarction ([place])    Stroke ([place])     Tobacco Use: Social History   Tobacco Use  Smoking Status Never  Smokeless Tobacco Never    Labs: Recent Review Flowsheet Data     Labs for ITP Cardiac and Pulmonary Rehab Latest Ref Rng & Units 11/01/2015 11/02/2015 12/13/2017 05/11/2020 05/11/2020   Cholestrol 100 - 199 mg/dL - - 151 - -   LDLCALC 0 - 99 mg/dL - - 75 - -   LDLDIRECT mg/dL - - - - -   HDL >39 mg/dL - - 53 - -   Trlycerides 0 - 149 mg/dL - - 114 - -  Hemoglobin A1c 4.6 - 6.1 % - - - - -   PHART 7.350 - 7.450 7.436 7.503(H) - - 7.349(L)   PCO2ART 32.0 - 48.0 mmHg 38.7 31.8(L) - - 42.6   HCO3 20.0 - 28.0 mmol/L 25.3 24.7 - 24.1 23.4   TCO2 22 - 32 mmol/L - - - 25 25   ACIDBASEDEF 0.0 - 2.0 mmol/L - - - 2.0 2.0   O2SAT % 98.4 91.4 - 76.0 100.0       Capillary Blood Glucose: Lab Results  Component Value Date   GLUCAP 105 (H) 11/03/2015   GLUCAP 92 11/02/2015   GLUCAP 114 (H) 11/02/2015   GLUCAP 92 11/02/2015   GLUCAP 87 11/02/2015     Exercise Target Goals: Exercise Program Goal: Individual exercise prescription set using results from initial 6 min walk test and THRR while considering  patients activity barriers and safety.   Exercise Prescription Goal: Starting with aerobic activity 30 plus minutes a day, 3 days per week for initial exercise prescription. Provide home exercise  prescription and guidelines that participant acknowledges understanding prior to discharge.  Activity Barriers & Risk Stratification:  Activity Barriers & Cardiac Risk Stratification - 12/13/20 1438       Activity Barriers & Cardiac Risk Stratification   Activity Barriers Joint Problems;Deconditioning;Balance Concerns   Bilateral knee pain   Cardiac Risk Stratification High             6 Minute Walk:  6 Minute Walk     Row Name 12/13/20 1434         6 Minute Walk   Phase Initial     Distance 1296 feet     Walk Time 6 minutes     # of Rest Breaks 0     MPH 2.45     METS 2.89     RPE 8     Perceived Dyspnea  0     VO2 Peak 10.1     Symptoms No     Resting HR 91 bpm     Resting BP 102/64     Resting Oxygen Saturation  98 %     Exercise Oxygen Saturation  during 6 min walk 97 %     Max Ex. HR 102 bpm     Max Ex. BP 100/60     2 Minute Post BP 96/60  Recheck after water, 110/74              Oxygen Initial Assessment:   Oxygen Re-Evaluation:   Oxygen Discharge (Final Oxygen Re-Evaluation):   Initial Exercise Prescription:  Initial Exercise Prescription - 12/13/20 1400       Date of Initial Exercise RX and Referring Provider   Date 12/13/20    Referring Provider Dr Dorris Carnes, MD    Expected Discharge Date 02/10/21      Recumbant Bike   Level 2    Minutes 15    METs 2.8      Arm Ergometer   Level 1.5    Minutes 15    METs 2.3      Prescription Details   Frequency (times per week) 3    Duration Progress to 30 minutes of continuous aerobic without signs/symptoms of physical distress      Intensity   THRR 40-80% of Max Heartrate 62-125    Ratings of Perceived Exertion 11-13    Perceived Dyspnea 0-4      Progression   Progression Continue progressive overload as per policy without signs/symptoms  or physical distress.      Resistance Training   Training Prescription Yes    Weight 3 lbs    Reps 10-15             Perform Capillary  Blood Glucose checks as needed.  Exercise Prescription Changes:   Exercise Comments:   Exercise Goals and Review:   Exercise Goals     Row Name 12/13/20 1439             Exercise Goals   Increase Physical Activity Yes       Intervention Provide advice, education, support and counseling about physical activity/exercise needs.;Develop an individualized exercise prescription for aerobic and resistive training based on initial evaluation findings, risk stratification, comorbidities and participant's personal goals.       Expected Outcomes Long Term: Exercising regularly at least 3-5 days a week.;Long Term: Add in home exercise to make exercise part of routine and to increase amount of physical activity.;Short Term: Attend rehab on a regular basis to increase amount of physical activity.       Increase Strength and Stamina Yes       Intervention Provide advice, education, support and counseling about physical activity/exercise needs.;Develop an individualized exercise prescription for aerobic and resistive training based on initial evaluation findings, risk stratification, comorbidities and participant's personal goals.       Expected Outcomes Short Term: Increase workloads from initial exercise prescription for resistance, speed, and METs.;Short Term: Perform resistance training exercises routinely during rehab and add in resistance training at home;Long Term: Improve cardiorespiratory fitness, muscular endurance and strength as measured by increased METs and functional capacity (6MWT)       Able to understand and use rate of perceived exertion (RPE) scale Yes       Intervention Provide education and explanation on how to use RPE scale       Expected Outcomes Short Term: Able to use RPE daily in rehab to express subjective intensity level;Long Term:  Able to use RPE to guide intensity level when exercising independently       Knowledge and understanding of Target Heart Rate Range (THRR) Yes        Intervention Provide education and explanation of THRR including how the numbers were predicted and where they are located for reference       Expected Outcomes Short Term: Able to state/look up THRR;Long Term: Able to use THRR to govern intensity when exercising independently;Short Term: Able to use daily as guideline for intensity in rehab       Understanding of Exercise Prescription Yes       Intervention Provide education, explanation, and written materials on patient's individual exercise prescription       Expected Outcomes Short Term: Able to explain program exercise prescription;Long Term: Able to explain home exercise prescription to exercise independently                Exercise Goals Re-Evaluation :    Discharge Exercise Prescription (Final Exercise Prescription Changes):   Nutrition:  Target Goals: Understanding of nutrition guidelines, daily intake of sodium 1500mg , cholesterol 200mg , calories 30% from fat and 7% or less from saturated fats, daily to have 5 or more servings of fruits and vegetables.  Biometrics:  Pre Biometrics - 12/13/20 1300       Pre Biometrics   Waist Circumference 44 inches    Hip Circumference 42 inches    Waist to Hip Ratio 1.05 %    Triceps Skinfold 11 mm    %  Body Fat 28.9 %    Grip Strength 36 kg    Flexibility 18 in    Single Leg Stand 4.43 seconds              Nutrition Therapy Plan and Nutrition Goals:   Nutrition Assessments:  MEDIFICTS Score Key: ?70 Need to make dietary changes  40-70 Heart Healthy Diet ? 40 Therapeutic Level Cholesterol Diet   Picture Your Plate Scores: <76 Unhealthy dietary pattern with much room for improvement. 41-50 Dietary pattern unlikely to meet recommendations for good health and room for improvement. 51-60 More healthful dietary pattern, with some room for improvement.  >60 Healthy dietary pattern, although there may be some specific behaviors that could be improved.     Nutrition Goals Re-Evaluation:   Nutrition Goals Discharge (Final Nutrition Goals Re-Evaluation):   Psychosocial: Target Goals: Acknowledge presence or absence of significant depression and/or stress, maximize coping skills, provide positive support system. Participant is able to verbalize types and ability to use techniques and skills needed for reducing stress and depression.  Initial Review & Psychosocial Screening:  Initial Psych Review & Screening - 12/13/20 1335       Initial Review   Current issues with History of Depression      Family Dynamics   Good Support System? Yes   Salvadore has his wife for support and 3 adult children     Barriers   Psychosocial barriers to participate in program There are no identifiable barriers or psychosocial needs.      Screening Interventions   Interventions Encouraged to exercise             Quality of Life Scores:  Quality of Life - 12/13/20 1430       Quality of Life   Select Quality of Life      Quality of Life Scores   Health/Function Pre 27.07 %    Socioeconomic Pre 25.93 %    Psych/Spiritual Pre 26 %    Family Pre 25.2 %    GLOBAL Pre 26.35 %            Scores of 19 and below usually indicate a poorer quality of life in these areas.  A difference of  2-3 points is a clinically meaningful difference.  A difference of 2-3 points in the total score of the Quality of Life Index has been associated with significant improvement in overall quality of life, self-image, physical symptoms, and general health in studies assessing change in quality of life.  PHQ-9: Recent Review Flowsheet Data     Depression screen [facility] 2/9 12/13/2020 01/03/2017 12/12/2015 11/21/2015   Decreased Interest 0 0 0 0   Down, Depressed, Hopeless 0 0 0 1   PHQ - 2 Score 0 0 0 1   Altered sleeping - - - 0   Tired, decreased energy - - - 1   Change in appetite - - - 1   Feeling bad or failure about yourself  - - - 1   Trouble concentrating - - -  1   Moving slowly or fidgety/restless - - - 0   Suicidal thoughts - - - 0   PHQ-9 Score - - - 5   Difficult doing work/chores - - - Somewhat difficult      Interpretation of Total Score  Total Score Depression Severity:  1-4 = Minimal depression, 5-9 = Mild depression, 10-14 = Moderate depression, 15-19 = Moderately severe depression, 20-27 = Severe depression   Psychosocial Evaluation and  Intervention:   Psychosocial Re-Evaluation:   Psychosocial Discharge (Final Psychosocial Re-Evaluation):   Vocational Rehabilitation: Provide vocational rehab assistance to qualifying candidates.   Vocational Rehab Evaluation & Intervention:  Vocational Rehab - 12/13/20 1336       Initial Vocational Rehab Evaluation & Intervention   Assessment shows need for Vocational Rehabilitation No   Jaqualyn is retired and does not need vocational rehab at this time            Education: Education Goals: Education classes will be provided on a weekly basis, covering required topics. Participant will state understanding/return demonstration of topics presented.  Learning Barriers/Preferences:  Learning Barriers/Preferences - 12/13/20 1432       Learning Barriers/Preferences   Learning Barriers Sight   wears reading glasses   Learning Preferences Written Material;Computer/Internet;Pictoral;Video             Education Topics: Hypertension, Hypertension Reduction -Define heart disease and high blood pressure. Discus how high blood pressure affects the body and ways to reduce high blood pressure.   Exercise and Your Heart -Discuss why it is important to exercise, the FITT principles of exercise, normal and abnormal responses to exercise, and how to exercise safely.   Angina -Discuss definition of angina, causes of angina, treatment of angina, and how to decrease risk of having angina.   Cardiac Medications -Review what the following cardiac medications are used for, how they affect  the body, and side effects that may occur when taking the medications.  Medications include Aspirin, Beta blockers, calcium channel blockers, ACE Inhibitors, angiotensin receptor blockers, diuretics, digoxin, and antihyperlipidemics.   Congestive Heart Failure -Discuss the definition of CHF, how to live with CHF, the signs and symptoms of CHF, and how keep track of weight and sodium intake.   Heart Disease and Intimacy -Discus the effect sexual activity has on the heart, how changes occur during intimacy as we age, and safety during sexual activity.   Smoking Cessation / COPD -Discuss different methods to quit smoking, the health benefits of quitting smoking, and the definition of COPD.   Nutrition I: Fats -Discuss the types of cholesterol, what cholesterol does to the heart, and how cholesterol levels can be controlled.   Nutrition II: Labels -Discuss the different components of food labels and how to read food label   Heart Parts/Heart Disease and PAD -Discuss the anatomy of the heart, the pathway of blood circulation through the heart, and these are affected by heart disease.   Stress I: Signs and Symptoms -Discuss the causes of stress, how stress may lead to anxiety and depression, and ways to limit stress.   Stress II: Relaxation -Discuss different types of relaxation techniques to limit stress.   Warning Signs of Stroke / TIA -Discuss definition of a stroke, what the signs and symptoms are of a stroke, and how to identify when someone is having stroke.   Knowledge Questionnaire Score:  Knowledge Questionnaire Score - 12/13/20 1431       Knowledge Questionnaire Score   Pre Score 21/24             Core Components/Risk Factors/Patient Goals at Admission:  Personal Goals and Risk Factors at Admission - 12/13/20 1433       Core Components/Risk Factors/Patient Goals on Admission    Weight Management Yes;Weight Loss    Intervention Weight Management: Develop a  combined nutrition and exercise program designed to reach desired caloric intake, while maintaining appropriate intake of nutrient and fiber, sodium and fats, and  appropriate energy expenditure required for the weight goal.;Weight Management: Provide education and appropriate resources to help participant work on and attain dietary goals.;Weight Management/Obesity: Establish reasonable short term and long term weight goals.    Admit Weight 194 lb 3.6 oz (88.1 kg)    Goal Weight: Long Term 169 lb (76.7 kg)   Pt's goal   Expected Outcomes Short Term: Continue to assess and modify interventions until short term weight is achieved;Long Term: Adherence to nutrition and physical activity/exercise program aimed toward attainment of established weight goal;Weight Maintenance: Understanding of the daily nutrition guidelines, which includes 25-35% calories from fat, 7% or less cal from saturated fats, less than 200mg  cholesterol, less than 1.5gm of sodium, & 5 or more servings of fruits and vegetables daily;Weight Loss: Understanding of general recommendations for a balanced deficit meal plan, which promotes 1-2 lb weight loss per week and includes a negative energy balance of [phone] kcal/d;Understanding recommendations for meals to include 15-35% energy as protein, 25-35% energy from fat, 35-60% energy from carbohydrates, less than 200mg  of dietary cholesterol, 20-35 gm of total fiber daily;Understanding of distribution of calorie intake throughout the day with the consumption of 4-5 meals/snacks    Lipids Yes    Intervention Provide education and support for participant on nutrition & aerobic/resistive exercise along with prescribed medications to achieve LDL 70mg , HDL >40mg .    Expected Outcomes Short Term: Participant states understanding of desired cholesterol values and is compliant with medications prescribed. Participant is following exercise prescription and nutrition guidelines.;Long Term: Cholesterol  controlled with medications as prescribed, with individualized exercise RX and with personalized nutrition plan. Value goals: LDL < 70mg , HDL > 40 mg.             Core Components/Risk Factors/Patient Goals Review:    Core Components/Risk Factors/Patient Goals at Discharge (Final Review):    ITP Comments:  ITP Comments     Row Name 12/13/20 1501           ITP Comments Dr Fransico Him MD, Medical Director                Comments: Warner Mccreedy attended orientation on 12/13/2020 to review rules and guidelines for program.  Completed 6 minute walk test, Intitial ITP, and exercise prescription.  VSS. Telemetry-Sinus Rhythm, Bundle Branch Block with an occasional PVC.  Asymptomatic. Safety measures and social distancing in place per CDC guidelines.Barnet Pall, RN,BSN 12/13/2020 3:09 PM

## 2020-12-15 DIAGNOSIS — M6281 Muscle weakness (generalized): Secondary | ICD-10-CM | POA: Diagnosis not present

## 2020-12-15 DIAGNOSIS — M94261 Chondromalacia, right knee: Secondary | ICD-10-CM | POA: Diagnosis not present

## 2020-12-15 DIAGNOSIS — M94262 Chondromalacia, left knee: Secondary | ICD-10-CM | POA: Diagnosis not present

## 2020-12-15 DIAGNOSIS — R262 Difficulty in walking, not elsewhere classified: Secondary | ICD-10-CM | POA: Diagnosis not present

## 2020-12-19 ENCOUNTER — Encounter (HOSPITAL_COMMUNITY): Payer: BC Managed Care – PPO

## 2020-12-19 ENCOUNTER — Telehealth (HOSPITAL_COMMUNITY): Payer: Self-pay | Admitting: Internal Medicine

## 2020-12-19 DIAGNOSIS — M94261 Chondromalacia, right knee: Secondary | ICD-10-CM | POA: Diagnosis not present

## 2020-12-19 DIAGNOSIS — R262 Difficulty in walking, not elsewhere classified: Secondary | ICD-10-CM | POA: Diagnosis not present

## 2020-12-19 DIAGNOSIS — M94262 Chondromalacia, left knee: Secondary | ICD-10-CM | POA: Diagnosis not present

## 2020-12-19 DIAGNOSIS — M6281 Muscle weakness (generalized): Secondary | ICD-10-CM | POA: Diagnosis not present

## 2020-12-21 ENCOUNTER — Encounter (HOSPITAL_COMMUNITY)
Admission: RE | Admit: 2020-12-21 | Discharge: 2020-12-21 | Disposition: A | Discharge status: Home / Self Care | Payer: BC Managed Care – PPO | Source: Ambulatory Visit | Attending: Cardiology | Admitting: Cardiology

## 2020-12-21 ENCOUNTER — Other Ambulatory Visit: Payer: Self-pay

## 2020-12-21 DIAGNOSIS — Z9889 Other specified postprocedural states: Secondary | ICD-10-CM

## 2020-12-21 DIAGNOSIS — R262 Difficulty in walking, not elsewhere classified: Secondary | ICD-10-CM | POA: Diagnosis not present

## 2020-12-21 DIAGNOSIS — M94261 Chondromalacia, right knee: Secondary | ICD-10-CM | POA: Diagnosis not present

## 2020-12-21 DIAGNOSIS — M94262 Chondromalacia, left knee: Secondary | ICD-10-CM | POA: Diagnosis not present

## 2020-12-21 DIAGNOSIS — M6281 Muscle weakness (generalized): Secondary | ICD-10-CM | POA: Diagnosis not present

## 2020-12-21 NOTE — Progress Notes (Signed)
Daily Session Note  Patient Details  Name: [name] MRN: [id number] Date of Birth: [birth year] Referring Provider:   Flowsheet Row CARDIAC REHAB PHASE II ORIENTATION from 12/13/2020 in [place]  Referring Provider Dr Dorris Carnes, MD       Encounter Date: 12/21/2020  Check In:  Session Check In - 12/21/20 1305       Check-In   Supervising physician immediately available to respond to emergencies [facility] immediately available    Physician(s) Dr [place] Pink    Location [facility]    Staff Present Barnet Pall, RN, Milus Glazier, MS, ACSM-CEP, CCRP, Exercise Physiologist;Jetta Gilford Rile BS, ACSM EP-C, Exercise Physiologist;Lisa Ysidro Evert, RN;Carlette Carlton, RN, BSN    Virtual Visit No    Medication changes reported     No    Fall or balance concerns reported    No    Tobacco Cessation No Change    Current number of cigarettes/nicotine per day     0    Warm-up and Cool-down Performed as group-led instruction    Resistance Training Performed No    VAD Patient? No    PAD/SET Patient? No      Pain Assessment   Currently in Pain? No/denies    Pain Score 0-No pain    Multiple Pain Sites No             Capillary Blood Glucose: No results found for this or any previous visit (from the past 24 hour(s)).   Exercise Prescription Changes - 12/21/20 1400       Response to Exercise   Blood Pressure (Admit) 106/70    Blood Pressure (Exercise) 140/74    Blood Pressure (Exit) 104/60    Heart Rate (Admit) 95 bpm    Heart Rate (Exercise) 120 bpm    Heart Rate (Exit) 81 bpm    Rating of Perceived Exertion (Exercise) 12    Symptoms None    Comments Pt's first day in the [facility] program    Duration Continue with 30 min of aerobic exercise without signs/symptoms of physical distress.    Intensity THRR unchanged      Progression   Progression Continue to progress workloads to maintain intensity without signs/symptoms of  physical distress.    Average METs 3      Resistance Training   Training Prescription No    Weight No weights on wednesdays      Interval Training   Interval Training No      Recumbant Bike   Level 2    RPM 77    Watts 37    Minutes 15    METs 3.8      Arm Ergometer   Level 2    Minutes 15    METs 2.2             Social History   Tobacco Use  Smoking Status Never  Smokeless Tobacco Never    Goals Met:  Exercise tolerated well No report of concerns or symptoms today  Goals Unmet:  Not Applicable  Comments: Pt started cardiac rehab today.  Pt tolerated light exercise without difficulty. VSS, telemetry-Sinus Rhythm, Bundle Branch Block, asymptomatic.  Medication list reconciled. Pt denies barriers to medicaiton compliance.  PSYCHOSOCIAL ASSESSMENT:  PHQ-0. Pt exhibits positive coping skills, hopeful outlook with supportive family. No psychosocial needs identified at this time, no psychosocial interventions necessary.    Pt enjoys reading and watching and TV.   Pt oriented to exercise equipment  and routine.    Understanding verbalized. Barnet Pall, RN,BSN 12/21/2020 2:45 PM    Dr. Fransico Him is Medical Director for Cardiac Rehab at [facility]

## 2020-12-21 NOTE — Progress Notes (Signed)
Cardiac Individual Treatment Plan  Patient Details  Name: [name] MRN: [id number] Date of Birth: May 02, 1955 Referring Provider:   Flowsheet Row CARDIAC REHAB PHASE II ORIENTATION from 12/13/2020 in [facility]  Referring Provider Dr Dorris Carnes, MD       Initial Encounter Date:  [place] from 12/13/2020 in [facility]  Date 12/13/20       Visit Diagnosis: 08/12/20 S/P mitral valve repair Dr Cheree Ditto [facility]  Patient's Home Medications on Admission:  Current Outpatient Medications:    amLODipine (NORVASC) 2.5 MG tablet, Take 1 tablet (2.5 mg total) by mouth daily., Disp: 90 tablet, Rfl: 1   amoxicillin (AMOXIL) 500 MG capsule, Take 4 capsules (2,000 mg total) by mouth as directed. 4 pills one hour before dental appointment, Disp: 4 capsule, Rfl: 2   apixaban (ELIQUIS) 5 MG TABS tablet, Take 1 tablet (5 mg total) by mouth 2 (two) times daily., Disp: 60 tablet, Rfl: 5   aspirin 81 MG chewable tablet, Chew 81 mg by mouth daily., Disp: , Rfl:    buPROPion (WELLBUTRIN XL) 150 MG 24 hr tablet, Take 150 mg by mouth daily., Disp: , Rfl:    cetirizine (ZYRTEC) 10 MG tablet, Take 10 mg by mouth daily as needed for allergies., Disp: , Rfl:    cholecalciferol (VITAMIN D3) 25 MCG (1000 UNIT) tablet, Take 1,000 Units by mouth daily., Disp: , Rfl:    diphenhydramine-acetaminophen (TYLENOL PM) 25-500 MG TABS tablet, Take 2 tablets by mouth at bedtime., Disp: , Rfl:    finasteride (PROSCAR) 5 MG tablet, Take 1 tablet (5 mg total) by mouth daily., Disp: 30 tablet, Rfl: 0   losartan (COZAAR) 50 MG tablet, Take 50 mg by mouth daily., Disp: , Rfl:    Melatonin 10 MG TABS, Take 10 mg by mouth at bedtime. , Disp: , Rfl:    oxymetazoline (AFRIN) 0.05 % nasal spray, Place 1 spray into both nostrils 2 (two) times daily as needed for congestion., Disp: , Rfl:    Probiotic Product  (PROBIOTIC DAILY PO), Take 1 tablet by mouth daily., Disp: , Rfl:    rosuvastatin (CRESTOR) 10 MG tablet, Take 1 tablet (10 mg total) by mouth daily., Disp: 90 tablet, Rfl: 3   tamsulosin (FLOMAX) 0.4 MG CAPS capsule, Take 0.4 mg by mouth daily., Disp: , Rfl:   Past Medical History: Past Medical History:  Diagnosis Date   Bacteremia 110/2017   Cancer ([place])    renal mass at preop   CVA (cerebral infarction)    hemmorhagic  CVA   Depression    Dyslipidemia    Endocarditis    [facility] [place]   History of kidney stones    Hypertension    Mitral regurgitation    Myocardial infarction ([place])    Stroke ([place])     Tobacco Use: Social History   Tobacco Use  Smoking Status Never  Smokeless Tobacco Never    Labs: Recent Review Flowsheet Data     Labs for ITP Cardiac and Pulmonary Rehab Latest Ref Rng & Units 11/01/2015 11/02/2015 12/13/2017 05/11/2020 05/11/2020   Cholestrol 100 - 199 mg/dL - - 151 - -   LDLCALC 0 - 99 mg/dL - - 75 - -   LDLDIRECT mg/dL - - - - -   HDL >39 mg/dL - - 53 - -   Trlycerides 0 - 149 mg/dL - - 114 - -  Hemoglobin A1c 4.6 - 6.1 % - - - - -   PHART 7.350 - 7.450 7.436 7.503(H) - - 7.349(L)   PCO2ART 32.0 - 48.0 mmHg 38.7 31.8(L) - - 42.6   HCO3 20.0 - 28.0 mmol/L 25.3 24.7 - 24.1 23.4   TCO2 22 - 32 mmol/L - - - 25 25   ACIDBASEDEF 0.0 - 2.0 mmol/L - - - 2.0 2.0   O2SAT % 98.4 91.4 - 76.0 100.0       Capillary Blood Glucose: Lab Results  Component Value Date   GLUCAP 105 (H) 11/03/2015   GLUCAP 92 11/02/2015   GLUCAP 114 (H) 11/02/2015   GLUCAP 92 11/02/2015   GLUCAP 87 11/02/2015     Exercise Target Goals: Exercise Program Goal: Individual exercise prescription set using results from initial 6 min walk test and THRR while considering  patients activity barriers and safety.   Exercise Prescription Goal: Starting with aerobic activity 30 plus minutes a day, 3 days per week for initial exercise prescription. Provide home exercise prescription  and guidelines that participant acknowledges understanding prior to discharge.  Activity Barriers & Risk Stratification:  Activity Barriers & Cardiac Risk Stratification - 12/13/20 1438       Activity Barriers & Cardiac Risk Stratification   Activity Barriers Joint Problems;Deconditioning;Balance Concerns   Bilateral knee pain   Cardiac Risk Stratification High             6 Minute Walk:  6 Minute Walk     Row Name 12/13/20 1434         6 Minute Walk   Phase Initial     Distance 1296 feet     Walk Time 6 minutes     # of Rest Breaks 0     MPH 2.45     METS 2.89     RPE 8     Perceived Dyspnea  0     VO2 Peak 10.1     Symptoms No     Resting HR 91 bpm     Resting BP 102/64     Resting Oxygen Saturation  98 %     Exercise Oxygen Saturation  during 6 min walk 97 %     Max Ex. HR 102 bpm     Max Ex. BP 100/60     2 Minute Post BP 96/60  Recheck after water, 110/74              Oxygen Initial Assessment:   Oxygen Re-Evaluation:   Oxygen Discharge (Final Oxygen Re-Evaluation):   Initial Exercise Prescription:  Initial Exercise Prescription - 12/13/20 1400       Date of Initial Exercise RX and Referring Provider   Date 12/13/20    Referring Provider Dr Dorris Carnes, MD    Expected Discharge Date 02/10/21      Recumbant Bike   Level 2    Minutes 15    METs 2.8      Arm Ergometer   Level 1.5    Minutes 15    METs 2.3      Prescription Details   Frequency (times per week) 3    Duration Progress to 30 minutes of continuous aerobic without signs/symptoms of physical distress      Intensity   THRR 40-80% of Max Heartrate 62-125    Ratings of Perceived Exertion 11-13    Perceived Dyspnea 0-4      Progression   Progression Continue progressive overload as per policy without signs/symptoms  or physical distress.      Resistance Training   Training Prescription Yes    Weight 3 lbs    Reps 10-15             Perform Capillary Blood Glucose  checks as needed.  Exercise Prescription Changes:   Exercise Prescription Changes     Row Name 12/21/20 1400             Response to Exercise   Blood Pressure (Admit) 106/70       Blood Pressure (Exercise) 140/74       Blood Pressure (Exit) 104/60       Heart Rate (Admit) 95 bpm       Heart Rate (Exercise) 120 bpm       Heart Rate (Exit) 81 bpm       Rating of Perceived Exertion (Exercise) 12       Symptoms None       Comments Pt's first day in the [facility] program       Duration Continue with 30 min of aerobic exercise without signs/symptoms of physical distress.       Intensity THRR unchanged         Progression   Progression Continue to progress workloads to maintain intensity without signs/symptoms of physical distress.       Average METs 3         Resistance Training   Training Prescription No       Weight No weights on wednesdays         Interval Training   Interval Training No         Recumbant Bike   Level 2       RPM 77       Watts 37       Minutes 15       METs 3.8         Arm Ergometer   Level 2       Minutes 15       METs 2.2                Exercise Comments:   Exercise Comments     Row Name 12/21/20 1426           Exercise Comments Pt's first day in the [facility] program. Pt tolerated session well with no complaints. Pt did voice that he did not like the arm ergometer so we will look next session to chnage his exercise Rx.                Exercise Goals and Review:   Exercise Goals     Row Name 12/13/20 1439             Exercise Goals   Increase Physical Activity Yes       Intervention Provide advice, education, support and counseling about physical activity/exercise needs.;Develop an individualized exercise prescription for aerobic and resistive training based on initial evaluation findings, risk stratification, comorbidities and participant's personal goals.       Expected Outcomes Long Term: Exercising regularly at least 3-5  days a week.;Long Term: Add in home exercise to make exercise part of routine and to increase amount of physical activity.;Short Term: Attend rehab on a regular basis to increase amount of physical activity.       Increase Strength and Stamina Yes       Intervention Provide advice, education, support and counseling about physical activity/exercise needs.;Develop an individualized exercise prescription for aerobic and  resistive training based on initial evaluation findings, risk stratification, comorbidities and participant's personal goals.       Expected Outcomes Short Term: Increase workloads from initial exercise prescription for resistance, speed, and METs.;Short Term: Perform resistance training exercises routinely during rehab and add in resistance training at home;Long Term: Improve cardiorespiratory fitness, muscular endurance and strength as measured by increased METs and functional capacity (6MWT)       Able to understand and use rate of perceived exertion (RPE) scale Yes       Intervention Provide education and explanation on how to use RPE scale       Expected Outcomes Short Term: Able to use RPE daily in rehab to express subjective intensity level;Long Term:  Able to use RPE to guide intensity level when exercising independently       Knowledge and understanding of Target Heart Rate Range (THRR) Yes       Intervention Provide education and explanation of THRR including how the numbers were predicted and where they are located for reference       Expected Outcomes Short Term: Able to state/look up THRR;Long Term: Able to use THRR to govern intensity when exercising independently;Short Term: Able to use daily as guideline for intensity in rehab       Understanding of Exercise Prescription Yes       Intervention Provide education, explanation, and written materials on patient's individual exercise prescription       Expected Outcomes Short Term: Able to explain program exercise  prescription;Long Term: Able to explain home exercise prescription to exercise independently                Exercise Goals Re-Evaluation :  Exercise Goals Re-Evaluation     [place] Name 12/21/20 1424             Exercise Goal Re-Evaluation   Exercise Goals Review Increase Physical Activity;Increase Strength and Stamina;Able to understand and use rate of perceived exertion (RPE) scale;Knowledge and understanding of Target Heart Rate Range (THRR);Understanding of Exercise Prescription       Comments Pt's first day in the [facility] program. Pt understands the exercise Rx, RPE scale and THRR.       Expected Outcomes Will continue to monitor patient and progress exercise workloads as tolerated.                 Discharge Exercise Prescription (Final Exercise Prescription Changes):  Exercise Prescription Changes - 12/21/20 1400       Response to Exercise   Blood Pressure (Admit) 106/70    Blood Pressure (Exercise) 140/74    Blood Pressure (Exit) 104/60    Heart Rate (Admit) 95 bpm    Heart Rate (Exercise) 120 bpm    Heart Rate (Exit) 81 bpm    Rating of Perceived Exertion (Exercise) 12    Symptoms None    Comments Pt's first day in the [facility] program    Duration Continue with 30 min of aerobic exercise without signs/symptoms of physical distress.    Intensity THRR unchanged      Progression   Progression Continue to progress workloads to maintain intensity without signs/symptoms of physical distress.    Average METs 3      Resistance Training   Training Prescription No    Weight No weights on wednesdays      Interval Training   Interval Training No      Recumbant Bike   Level 2    RPM 77  Watts 37    Minutes 15    METs 3.8      Arm Ergometer   Level 2    Minutes 15    METs 2.2             Nutrition:  Target Goals: Understanding of nutrition guidelines, daily intake of sodium 1500mg , cholesterol 200mg , calories 30% from fat and 7% or less from saturated  fats, daily to have 5 or more servings of fruits and vegetables.  Biometrics:  Pre Biometrics - 12/13/20 1300       Pre Biometrics   Waist Circumference 44 inches    Hip Circumference 42 inches    Waist to Hip Ratio 1.05 %    Triceps Skinfold 11 mm    % Body Fat 28.9 %    Grip Strength 36 kg    Flexibility 18 in    Single Leg Stand 4.43 seconds              Nutrition Therapy Plan and Nutrition Goals:   Nutrition Assessments:  MEDIFICTS Score Key: ?70 Need to make dietary changes  40-70 Heart Healthy Diet ? 40 Therapeutic Level Cholesterol Diet   Picture Your Plate Scores: <62 Unhealthy dietary pattern with much room for improvement. 41-50 Dietary pattern unlikely to meet recommendations for good health and room for improvement. 51-60 More healthful dietary pattern, with some room for improvement.  >60 Healthy dietary pattern, although there may be some specific behaviors that could be improved.    Nutrition Goals Re-Evaluation:   Nutrition Goals Discharge (Final Nutrition Goals Re-Evaluation):   Psychosocial: Target Goals: Acknowledge presence or absence of significant depression and/or stress, maximize coping skills, provide positive support system. Participant is able to verbalize types and ability to use techniques and skills needed for reducing stress and depression.  Initial Review & Psychosocial Screening:  Initial Psych Review & Screening - 12/13/20 1335       Initial Review   Current issues with History of Depression      Family Dynamics   Good Support System? Yes   Jojo has his wife for support and 3 adult children     Barriers   Psychosocial barriers to participate in program There are no identifiable barriers or psychosocial needs.      Screening Interventions   Interventions Encouraged to exercise             Quality of Life Scores:  Quality of Life - 12/13/20 1430       Quality of Life   Select Quality of Life      Quality of  Life Scores   Health/Function Pre 27.07 %    Socioeconomic Pre 25.93 %    Psych/Spiritual Pre 26 %    Family Pre 25.2 %    GLOBAL Pre 26.35 %            Scores of 19 and below usually indicate a poorer quality of life in these areas.  A difference of  2-3 points is a clinically meaningful difference.  A difference of 2-3 points in the total score of the Quality of Life Index has been associated with significant improvement in overall quality of life, self-image, physical symptoms, and general health in studies assessing change in quality of life.  PHQ-9: Recent Review Flowsheet Data     Depression screen [facility] 2/9 12/13/2020 01/03/2017 12/12/2015 11/21/2015   Decreased Interest 0 0 0 0   Down, Depressed, Hopeless 0 0 0 1   PHQ -  2 Score 0 0 0 1   Altered sleeping - - - 0   Tired, decreased energy - - - 1   Change in appetite - - - 1   Feeling bad or failure about yourself  - - - 1   Trouble concentrating - - - 1   Moving slowly or fidgety/restless - - - 0   Suicidal thoughts - - - 0   PHQ-9 Score - - - 5   Difficult doing work/chores - - - Somewhat difficult      Interpretation of Total Score  Total Score Depression Severity:  1-4 = Minimal depression, 5-9 = Mild depression, 10-14 = Moderate depression, 15-19 = Moderately severe depression, 20-27 = Severe depression   Psychosocial Evaluation and Intervention:   Psychosocial Re-Evaluation:  Psychosocial Re-Evaluation     Row Name 12/21/20 1437             Psychosocial Re-Evaluation   Current issues with History of Depression       Comments Lyndal started cardiac rehab on 12/22/19 no concerns voiced       Expected Outcomes Rhea's depression will be controlled upon completion of phase 2 cardiac rehab       Interventions Encouraged to attend Cardiac Rehabilitation for the exercise;Stress management education       Continue Psychosocial Services  No Follow up required                Psychosocial Discharge (Final  Psychosocial Re-Evaluation):  Psychosocial Re-Evaluation - 12/21/20 1437       Psychosocial Re-Evaluation   Current issues with History of Depression    Comments Eural started cardiac rehab on 12/22/19 no concerns voiced    Expected Outcomes Waylon's depression will be controlled upon completion of phase 2 cardiac rehab    Interventions Encouraged to attend Cardiac Rehabilitation for the exercise;Stress management education    Continue Psychosocial Services  No Follow up required             Vocational Rehabilitation: Provide vocational rehab assistance to qualifying candidates.   Vocational Rehab Evaluation & Intervention:  Vocational Rehab - 12/13/20 1336       Initial Vocational Rehab Evaluation & Intervention   Assessment shows need for Vocational Rehabilitation No   Chimaobi is retired and does not need vocational rehab at this time            Education: Education Goals: Education classes will be provided on a weekly basis, covering required topics. Participant will state understanding/return demonstration of topics presented.  Learning Barriers/Preferences:  Learning Barriers/Preferences - 12/13/20 1432       Learning Barriers/Preferences   Learning Barriers Sight   wears reading glasses   Learning Preferences Written Material;Computer/Internet;Pictoral;Video             Education Topics: Hypertension, Hypertension Reduction -Define heart disease and high blood pressure. Discus how high blood pressure affects the body and ways to reduce high blood pressure.   Exercise and Your Heart -Discuss why it is important to exercise, the FITT principles of exercise, normal and abnormal responses to exercise, and how to exercise safely.   Angina -Discuss definition of angina, causes of angina, treatment of angina, and how to decrease risk of having angina.   Cardiac Medications -Review what the following cardiac medications are used for, how they affect the body,  and side effects that may occur when taking the medications.  Medications include Aspirin, Beta blockers, calcium channel blockers, ACE Inhibitors, angiotensin receptor  blockers, diuretics, digoxin, and antihyperlipidemics.   Congestive Heart Failure -Discuss the definition of CHF, how to live with CHF, the signs and symptoms of CHF, and how keep track of weight and sodium intake.   Heart Disease and Intimacy -Discus the effect sexual activity has on the heart, how changes occur during intimacy as we age, and safety during sexual activity.   Smoking Cessation / COPD -Discuss different methods to quit smoking, the health benefits of quitting smoking, and the definition of COPD.   Nutrition I: Fats -Discuss the types of cholesterol, what cholesterol does to the heart, and how cholesterol levels can be controlled.   Nutrition II: Labels -Discuss the different components of food labels and how to read food label   Heart Parts/Heart Disease and PAD -Discuss the anatomy of the heart, the pathway of blood circulation through the heart, and these are affected by heart disease.   Stress I: Signs and Symptoms -Discuss the causes of stress, how stress may lead to anxiety and depression, and ways to limit stress.   Stress II: Relaxation -Discuss different types of relaxation techniques to limit stress.   Warning Signs of Stroke / TIA -Discuss definition of a stroke, what the signs and symptoms are of a stroke, and how to identify when someone is having stroke.   Knowledge Questionnaire Score:  Knowledge Questionnaire Score - 12/13/20 1431       Knowledge Questionnaire Score   Pre Score 21/24             Core Components/Risk Factors/Patient Goals at Admission:  Personal Goals and Risk Factors at Admission - 12/13/20 1433       Core Components/Risk Factors/Patient Goals on Admission    Weight Management Yes;Weight Loss    Intervention Weight Management: Develop a combined  nutrition and exercise program designed to reach desired caloric intake, while maintaining appropriate intake of nutrient and fiber, sodium and fats, and appropriate energy expenditure required for the weight goal.;Weight Management: Provide education and appropriate resources to help participant work on and attain dietary goals.;Weight Management/Obesity: Establish reasonable short term and long term weight goals.    Admit Weight 194 lb 3.6 oz (88.1 kg)    Goal Weight: Long Term 169 lb (76.7 kg)   Pt's goal   Expected Outcomes Short Term: Continue to assess and modify interventions until short term weight is achieved;Long Term: Adherence to nutrition and physical activity/exercise program aimed toward attainment of established weight goal;Weight Maintenance: Understanding of the daily nutrition guidelines, which includes 25-35% calories from fat, 7% or less cal from saturated fats, less than 200mg  cholesterol, less than 1.5gm of sodium, & 5 or more servings of fruits and vegetables daily;Weight Loss: Understanding of general recommendations for a balanced deficit meal plan, which promotes 1-2 lb weight loss per week and includes a negative energy balance of [id number] kcal/d;Understanding recommendations for meals to include 15-35% energy as protein, 25-35% energy from fat, 35-60% energy from carbohydrates, less than 200mg  of dietary cholesterol, 20-35 gm of total fiber daily;Understanding of distribution of calorie intake throughout the day with the consumption of 4-5 meals/snacks    Lipids Yes    Intervention Provide education and support for participant on nutrition & aerobic/resistive exercise along with prescribed medications to achieve LDL 70mg , HDL >40mg .    Expected Outcomes Short Term: Participant states understanding of desired cholesterol values and is compliant with medications prescribed. Participant is following exercise prescription and nutrition guidelines.;Long Term: Cholesterol controlled  with medications as prescribed, with  individualized exercise RX and with personalized nutrition plan. Value goals: LDL < 70mg , HDL > 40 mg.             Core Components/Risk Factors/Patient Goals Review:   Goals and Risk Factor Review     Row Name 12/21/20 1440             Core Components/Risk Factors/Patient Goals Review   Personal Goals Review Weight Management/Obesity;Lipids       Review Ronnie started exercise at cardiac rehab on 12/21/20. Daris did well with exercise. Vital signs stable.       Expected Outcomes Rubel will continue to participate in phase 2 cardiac rehab for exercise, nutrition and lifestyle modifications                Core Components/Risk Factors/Patient Goals at Discharge (Final Review):   Goals and Risk Factor Review - 12/21/20 1440       Core Components/Risk Factors/Patient Goals Review   Personal Goals Review Weight Management/Obesity;Lipids    Review Teigen started exercise at cardiac rehab on 12/21/20. Zvi did well with exercise. Vital signs stable.    Expected Outcomes Parnell will continue to participate in phase 2 cardiac rehab for exercise, nutrition and lifestyle modifications             ITP Comments:  ITP Comments     Row Name 12/13/20 1501 12/21/20 1433         ITP Comments Dr Fransico Him MD, Medical Director 30 Day ITP Review. Camil started cardiac rehab on 12/21/20. Griffin did well with exercise               Comments: See ITP comments.Barnet Pall, RN,BSN 12/21/2020 2:46 PM

## 2020-12-23 ENCOUNTER — Other Ambulatory Visit: Payer: Self-pay

## 2020-12-23 ENCOUNTER — Encounter (HOSPITAL_COMMUNITY)
Admission: RE | Admit: 2020-12-23 | Discharge: 2020-12-23 | Disposition: A | Discharge status: Home / Self Care | Payer: BC Managed Care – PPO | Source: Ambulatory Visit | Attending: Cardiology | Admitting: Cardiology

## 2020-12-23 DIAGNOSIS — Z9889 Other specified postprocedural states: Secondary | ICD-10-CM

## 2020-12-27 DIAGNOSIS — M6281 Muscle weakness (generalized): Secondary | ICD-10-CM | POA: Diagnosis not present

## 2020-12-27 DIAGNOSIS — M94262 Chondromalacia, left knee: Secondary | ICD-10-CM | POA: Diagnosis not present

## 2020-12-27 DIAGNOSIS — M94261 Chondromalacia, right knee: Secondary | ICD-10-CM | POA: Diagnosis not present

## 2020-12-27 DIAGNOSIS — R262 Difficulty in walking, not elsewhere classified: Secondary | ICD-10-CM | POA: Diagnosis not present

## 2020-12-28 ENCOUNTER — Other Ambulatory Visit: Payer: Self-pay

## 2020-12-28 ENCOUNTER — Encounter (HOSPITAL_COMMUNITY)
Admission: RE | Admit: 2020-12-28 | Discharge: 2020-12-28 | Disposition: A | Discharge status: Home / Self Care | Payer: BC Managed Care – PPO | Source: Ambulatory Visit | Attending: Cardiology | Admitting: Cardiology

## 2020-12-28 DIAGNOSIS — Z9889 Other specified postprocedural states: Secondary | ICD-10-CM | POA: Diagnosis not present

## 2020-12-30 ENCOUNTER — Other Ambulatory Visit: Payer: Self-pay

## 2020-12-30 ENCOUNTER — Encounter (HOSPITAL_COMMUNITY)
Admission: RE | Admit: 2020-12-30 | Discharge: 2020-12-30 | Disposition: A | Discharge status: Home / Self Care | Payer: BC Managed Care – PPO | Source: Ambulatory Visit | Attending: Cardiology | Admitting: Cardiology

## 2020-12-30 DIAGNOSIS — Z9889 Other specified postprocedural states: Secondary | ICD-10-CM

## 2021-01-03 DIAGNOSIS — Z905 Acquired absence of kidney: Secondary | ICD-10-CM | POA: Diagnosis not present

## 2021-01-03 DIAGNOSIS — C641 Malignant neoplasm of right kidney, except renal pelvis: Secondary | ICD-10-CM | POA: Diagnosis not present

## 2021-01-03 DIAGNOSIS — R3914 Feeling of incomplete bladder emptying: Secondary | ICD-10-CM | POA: Diagnosis not present

## 2021-01-03 DIAGNOSIS — D4102 Neoplasm of uncertain behavior of left kidney: Secondary | ICD-10-CM | POA: Diagnosis not present

## 2021-01-03 DIAGNOSIS — R351 Nocturia: Secondary | ICD-10-CM | POA: Diagnosis not present

## 2021-01-04 ENCOUNTER — Other Ambulatory Visit: Payer: Self-pay

## 2021-01-04 ENCOUNTER — Encounter (HOSPITAL_COMMUNITY)
Admission: RE | Admit: 2021-01-04 | Discharge: 2021-01-04 | Disposition: A | Discharge status: Home / Self Care | Payer: BC Managed Care – PPO | Source: Ambulatory Visit | Attending: Cardiology | Admitting: Cardiology

## 2021-01-04 DIAGNOSIS — Z9889 Other specified postprocedural states: Secondary | ICD-10-CM | POA: Diagnosis present

## 2021-01-06 ENCOUNTER — Other Ambulatory Visit: Payer: Self-pay

## 2021-01-06 ENCOUNTER — Encounter (HOSPITAL_COMMUNITY)
Admission: RE | Admit: 2021-01-06 | Discharge: 2021-01-06 | Disposition: A | Discharge status: Home / Self Care | Payer: BC Managed Care – PPO | Source: Ambulatory Visit | Attending: Cardiology | Admitting: Cardiology

## 2021-01-06 DIAGNOSIS — Z9889 Other specified postprocedural states: Secondary | ICD-10-CM | POA: Diagnosis not present

## 2021-01-09 ENCOUNTER — Other Ambulatory Visit: Payer: Self-pay

## 2021-01-09 ENCOUNTER — Encounter (HOSPITAL_COMMUNITY)
Admission: RE | Admit: 2021-01-09 | Discharge: 2021-01-09 | Disposition: A | Discharge status: Home / Self Care | Payer: BC Managed Care – PPO | Source: Ambulatory Visit | Attending: Cardiology | Admitting: Cardiology

## 2021-01-09 DIAGNOSIS — Z9889 Other specified postprocedural states: Secondary | ICD-10-CM

## 2021-01-11 ENCOUNTER — Other Ambulatory Visit: Payer: Self-pay

## 2021-01-11 ENCOUNTER — Encounter (HOSPITAL_COMMUNITY)
Admission: RE | Admit: 2021-01-11 | Discharge: 2021-01-11 | Disposition: A | Discharge status: Home / Self Care | Payer: BC Managed Care – PPO | Source: Ambulatory Visit | Attending: Cardiology | Admitting: Cardiology

## 2021-01-11 DIAGNOSIS — Z905 Acquired absence of kidney: Secondary | ICD-10-CM | POA: Diagnosis not present

## 2021-01-11 DIAGNOSIS — Z9889 Other specified postprocedural states: Secondary | ICD-10-CM | POA: Diagnosis not present

## 2021-01-11 DIAGNOSIS — N1831 Chronic kidney disease, stage 3a: Secondary | ICD-10-CM | POA: Diagnosis not present

## 2021-01-11 DIAGNOSIS — I129 Hypertensive chronic kidney disease with stage 1 through stage 4 chronic kidney disease, or unspecified chronic kidney disease: Secondary | ICD-10-CM | POA: Diagnosis not present

## 2021-01-11 DIAGNOSIS — D631 Anemia in chronic kidney disease: Secondary | ICD-10-CM | POA: Diagnosis not present

## 2021-01-12 ENCOUNTER — Encounter: Payer: Self-pay | Admitting: Internal Medicine

## 2021-01-12 NOTE — Progress Notes (Signed)
Reviewed home exercise Rx with patient today. Encouraged warm-up, cool-down and stretching. Reviewed THRR and keeping RPE between 11-13. Encouraged hydration with activity. Discussed weather parameters for temperature and humidity for safe exercise outdoors. Reviewed S/S that would require patient to terminate exercise and when to call 911 vs MD. Pt verbalized understanding of the home exercise Rx and was provided a copy.   Lesly Rubenstein MS, ACSM-CEP, CCRP

## 2021-01-13 ENCOUNTER — Other Ambulatory Visit: Payer: Self-pay

## 2021-01-13 ENCOUNTER — Encounter (HOSPITAL_COMMUNITY)
Admission: RE | Admit: 2021-01-13 | Discharge: 2021-01-13 | Disposition: A | Discharge status: Home / Self Care | Payer: BC Managed Care – PPO | Source: Ambulatory Visit | Attending: Cardiology | Admitting: Cardiology

## 2021-01-13 DIAGNOSIS — Z9889 Other specified postprocedural states: Secondary | ICD-10-CM | POA: Diagnosis not present

## 2021-01-13 NOTE — Telephone Encounter (Signed)
Pt given 2 weeks worth of samples with copay cards for Eliquis but he is asking when he can go ff the Eliquis.He says he only had Afib when having his Mitral Valve repair  but he was under the thought that he would not be on it "long term"... I will forward to [name] for her review.

## 2021-01-16 ENCOUNTER — Other Ambulatory Visit: Payer: Self-pay

## 2021-01-16 ENCOUNTER — Encounter (HOSPITAL_COMMUNITY)
Admission: RE | Admit: 2021-01-16 | Discharge: 2021-01-16 | Disposition: A | Discharge status: Home / Self Care | Payer: BC Managed Care – PPO | Source: Ambulatory Visit | Attending: Cardiology | Admitting: Cardiology

## 2021-01-16 DIAGNOSIS — Z9889 Other specified postprocedural states: Secondary | ICD-10-CM

## 2021-01-16 NOTE — Telephone Encounter (Signed)
Pt currently in cardiac rehab.   No atrial fibrillation at any of sessions Recom:  Finish the few wks of Eliquis then stop     Pt is OK to hold for dental appt if extensive work done    [employer] he needs Abx before dental work

## 2021-01-18 ENCOUNTER — Encounter (HOSPITAL_COMMUNITY)
Admission: RE | Admit: 2021-01-18 | Discharge: 2021-01-18 | Disposition: A | Discharge status: Home / Self Care | Payer: BC Managed Care – PPO | Source: Ambulatory Visit | Attending: Cardiology | Admitting: Cardiology

## 2021-01-18 ENCOUNTER — Other Ambulatory Visit: Payer: Self-pay

## 2021-01-18 DIAGNOSIS — Z9889 Other specified postprocedural states: Secondary | ICD-10-CM | POA: Diagnosis not present

## 2021-01-20 ENCOUNTER — Other Ambulatory Visit: Payer: Self-pay

## 2021-01-20 ENCOUNTER — Encounter (HOSPITAL_COMMUNITY)
Admission: RE | Admit: 2021-01-20 | Discharge: 2021-01-20 | Disposition: A | Discharge status: Home / Self Care | Payer: BC Managed Care – PPO | Source: Ambulatory Visit | Attending: Cardiology | Admitting: Cardiology

## 2021-01-20 DIAGNOSIS — Z9889 Other specified postprocedural states: Secondary | ICD-10-CM | POA: Diagnosis not present

## 2021-01-20 NOTE — Progress Notes (Signed)
Resting heart rate noted in the upper 90's low 100's. Sinus Rhythm, Sinus tach occasional PVC will send exercise flow sheets with vital signs for Dr Harrington Challenger to review. Davonta continues to do well with exercise at cardiac rehab.Barnet Pall, RN,BSN 01/20/2021 4:19 PM

## 2021-01-23 ENCOUNTER — Encounter (HOSPITAL_COMMUNITY)
Admission: RE | Admit: 2021-01-23 | Discharge: 2021-01-23 | Disposition: A | Discharge status: Home / Self Care | Payer: BC Managed Care – PPO | Source: Ambulatory Visit | Attending: Cardiology | Admitting: Cardiology

## 2021-01-23 ENCOUNTER — Other Ambulatory Visit: Payer: Self-pay

## 2021-01-23 DIAGNOSIS — Z9889 Other specified postprocedural states: Secondary | ICD-10-CM

## 2021-01-24 ENCOUNTER — Telehealth: Payer: Self-pay | Admitting: Internal Medicine

## 2021-01-24 NOTE — Telephone Encounter (Signed)
° °  Patient Name: [name]  DOB: 07-24-1955 MRN: [id number]  Primary Cardiologist: Dorris Carnes, MD  Chart reviewed as part of pre-operative protocol coverage.   Dental extractions of 1 or 2 teeth are considered low risk procedures per guidelines and generally do not require any specific cardiac clearance. It is also generally accepted that for dental cleanings, there is no need to interrupt blood thinner therapy.  SBE prophylaxis is required for the patient from a cardiac standpoint.  Amoxicillin was prescribed on 11/15/20, patient should have this 30-60 min prior.   I will route this recommendation to the requesting party via Epic fax function and remove from pre-op pool.  Please call with questions.  Tami Lin Josephmichael Lisenbee, PA 01/24/2021, 10:22 AM

## 2021-01-24 NOTE — Telephone Encounter (Signed)
° ° ° °  Pre-operative Risk Assessment    Patient Name: [name]  DOB: March 01, 1955 MRN: [id number]     Request for Surgical Clearance    Procedure:   dental cleaning  Date of Surgery:  Clearance 01/24/21                                 Surgeon:  Dr. Jetty Duhamel Surgeon's Group or Practice Name:  Dr. Jetty Duhamel Phone number:  [phone] Fax number:  [phone]   Type of Clearance Requested:   - Medical  - Pharmacy:  Hold Apixaban (Eliquis) defer to cards   Type of Anesthesia:  None    Additional requests/questions:   pt's appt is today at 10:30 am  Signed, Selinda Orion   01/24/2021, 8:51 AM

## 2021-01-25 ENCOUNTER — Encounter (HOSPITAL_COMMUNITY)
Admission: RE | Admit: 2021-01-25 | Discharge: 2021-01-25 | Disposition: A | Discharge status: Home / Self Care | Payer: BC Managed Care – PPO | Source: Ambulatory Visit | Attending: Cardiology | Admitting: Cardiology

## 2021-01-25 ENCOUNTER — Other Ambulatory Visit: Payer: Self-pay

## 2021-01-25 DIAGNOSIS — Z9889 Other specified postprocedural states: Secondary | ICD-10-CM | POA: Diagnosis not present

## 2021-01-27 ENCOUNTER — Encounter (HOSPITAL_COMMUNITY)
Admission: RE | Admit: 2021-01-27 | Discharge: 2021-01-27 | Disposition: A | Discharge status: Home / Self Care | Payer: BC Managed Care – PPO | Source: Ambulatory Visit | Attending: Cardiology | Admitting: Cardiology

## 2021-01-27 ENCOUNTER — Other Ambulatory Visit: Payer: Self-pay

## 2021-01-27 DIAGNOSIS — Z9889 Other specified postprocedural states: Secondary | ICD-10-CM

## 2021-01-30 ENCOUNTER — Encounter (HOSPITAL_COMMUNITY)
Admission: RE | Admit: 2021-01-30 | Discharge: 2021-01-30 | Disposition: A | Discharge status: Home / Self Care | Payer: BC Managed Care – PPO | Source: Ambulatory Visit | Attending: Cardiology | Admitting: Cardiology

## 2021-01-30 ENCOUNTER — Other Ambulatory Visit: Payer: Self-pay

## 2021-01-30 DIAGNOSIS — Z9889 Other specified postprocedural states: Secondary | ICD-10-CM | POA: Diagnosis not present

## 2021-02-01 ENCOUNTER — Encounter (HOSPITAL_COMMUNITY)
Admission: RE | Admit: 2021-02-01 | Discharge: 2021-02-01 | Disposition: A | Discharge status: Home / Self Care | Payer: 59 | Source: Ambulatory Visit | Attending: Cardiology | Admitting: Cardiology

## 2021-02-01 ENCOUNTER — Other Ambulatory Visit: Payer: Self-pay

## 2021-02-01 DIAGNOSIS — Z9889 Other specified postprocedural states: Secondary | ICD-10-CM | POA: Diagnosis not present

## 2021-02-03 ENCOUNTER — Encounter (HOSPITAL_COMMUNITY)
Admission: RE | Admit: 2021-02-03 | Discharge: 2021-02-03 | Disposition: A | Discharge status: Home / Self Care | Payer: 59 | Source: Ambulatory Visit | Attending: Cardiology | Admitting: Cardiology

## 2021-02-03 ENCOUNTER — Other Ambulatory Visit: Payer: Self-pay

## 2021-02-03 DIAGNOSIS — Z9889 Other specified postprocedural states: Secondary | ICD-10-CM | POA: Diagnosis not present

## 2021-02-06 ENCOUNTER — Other Ambulatory Visit: Payer: Self-pay

## 2021-02-06 ENCOUNTER — Encounter (HOSPITAL_COMMUNITY)
Admission: RE | Admit: 2021-02-06 | Discharge: 2021-02-06 | Disposition: A | Discharge status: Home / Self Care | Payer: 59 | Source: Ambulatory Visit | Attending: Cardiology | Admitting: Cardiology

## 2021-02-06 VITALS — Ht 67.75 in | Wt 184.7 lb

## 2021-02-06 DIAGNOSIS — Z9889 Other specified postprocedural states: Secondary | ICD-10-CM | POA: Diagnosis not present

## 2021-02-08 ENCOUNTER — Other Ambulatory Visit: Payer: Self-pay

## 2021-02-08 ENCOUNTER — Encounter (HOSPITAL_COMMUNITY)
Admission: RE | Admit: 2021-02-08 | Discharge: 2021-02-08 | Disposition: A | Discharge status: Home / Self Care | Payer: 59 | Source: Ambulatory Visit | Attending: Cardiology | Admitting: Cardiology

## 2021-02-08 DIAGNOSIS — Z9889 Other specified postprocedural states: Secondary | ICD-10-CM | POA: Diagnosis not present

## 2021-02-08 DIAGNOSIS — D2271 Melanocytic nevi of right lower limb, including hip: Secondary | ICD-10-CM | POA: Diagnosis not present

## 2021-02-08 DIAGNOSIS — D2261 Melanocytic nevi of right upper limb, including shoulder: Secondary | ICD-10-CM | POA: Diagnosis not present

## 2021-02-08 DIAGNOSIS — D2272 Melanocytic nevi of left lower limb, including hip: Secondary | ICD-10-CM | POA: Diagnosis not present

## 2021-02-08 DIAGNOSIS — D225 Melanocytic nevi of trunk: Secondary | ICD-10-CM | POA: Diagnosis not present

## 2021-02-08 DIAGNOSIS — D2262 Melanocytic nevi of left upper limb, including shoulder: Secondary | ICD-10-CM | POA: Diagnosis not present

## 2021-02-08 DIAGNOSIS — L814 Other melanin hyperpigmentation: Secondary | ICD-10-CM | POA: Diagnosis not present

## 2021-02-08 DIAGNOSIS — L821 Other seborrheic keratosis: Secondary | ICD-10-CM | POA: Diagnosis not present

## 2021-02-10 ENCOUNTER — Other Ambulatory Visit: Payer: Self-pay

## 2021-02-10 ENCOUNTER — Encounter (HOSPITAL_COMMUNITY)
Admission: RE | Admit: 2021-02-10 | Discharge: 2021-02-10 | Disposition: A | Discharge status: Home / Self Care | Payer: 59 | Source: Ambulatory Visit | Attending: Cardiology | Admitting: Cardiology

## 2021-02-10 DIAGNOSIS — Z9889 Other specified postprocedural states: Secondary | ICD-10-CM

## 2021-02-10 NOTE — Progress Notes (Signed)
Discharge Progress Report  Patient Details  Name: Dink Creps MRN: [id number] Date of Birth: 1955-05-15 Referring Provider:   Flowsheet Row CARDIAC REHAB PHASE II ORIENTATION from 12/13/2020 in [place]  Referring Provider Dr Dorris Carnes, MD        Number of Visits: 21  Reason for Discharge:  Patient reached a stable level of exercise. Patient independent in their exercise. Patient has met program and personal goals.  Smoking History:  Social History   Tobacco Use  Smoking Status Never  Smokeless Tobacco Never    Diagnosis:  08/12/20 S/P mitral valve repair Dr Cheree Ditto at [facility]:   Initial Exercise Prescription:  Initial Exercise Prescription - 12/13/20 1400       Date of Initial Exercise RX and Referring Provider   Date 12/13/20    Referring Provider Dr Dorris Carnes, MD    Expected Discharge Date 02/10/21      Recumbant Bike   Level 2    Minutes 15    METs 2.8      Arm Ergometer   Level 1.5    Minutes 15    METs 2.3      Prescription Details   Frequency (times per week) 3    Duration Progress to 30 minutes of continuous aerobic without signs/symptoms of physical distress      Intensity   THRR 40-80% of Max Heartrate 62-125    Ratings of Perceived Exertion 11-13    Perceived Dyspnea 0-4      Progression   Progression Continue progressive overload as per policy without signs/symptoms or physical distress.      Resistance Training   Training Prescription Yes    Weight 3 lbs    Reps 10-15             Discharge Exercise Prescription (Final Exercise Prescription Changes):  Exercise Prescription Changes - 02/10/21 1500       Response to Exercise   Blood Pressure (Admit) 114/70    Blood Pressure (Exercise) 118/72    Blood Pressure (Exit) 102/60    Heart Rate (Admit) 86 bpm    Heart Rate (Exercise) 131 bpm    Heart Rate (Exit) 100 bpm    Rating of Perceived Exertion (Exercise) 11     Symptoms None    Comments Reviewed METS, Goals and Home exercise Rx    Duration Continue with 30 min of aerobic exercise without signs/symptoms of physical distress.    Intensity THRR unchanged      Progression   Progression Continue to progress workloads to maintain intensity without signs/symptoms of physical distress.    Average METs 6.68      Resistance Training   Training Prescription Yes    Weight 5 lb wts    Reps 10-15    Time 10 Minutes      Interval Training   Interval Training No      Recumbant Bike   Level 4.5    Minutes 15    METs 5.6      NuStep   Level 5    SPM 110    Minutes 15    METs 4.2      Rower   Level 8    Watts 83    Minutes 5    METs 10.25      Home Exercise Plan   Plans to continue exercise at Home (comment)    Frequency Add 2 additional days to program exercise  sessions.    Initial Home Exercises Provided 01/11/21             Functional Capacity:  6 Minute Walk     Row Name 12/13/20 1434 02/08/21 1525       6 Minute Walk   Phase Initial Discharge    Distance 1296 feet 1543 feet    Distance % Change -- 19.06 %    Distance Feet Change -- 247 ft    Walk Time 6 minutes 6 minutes    # of Rest Breaks 0 0    MPH 2.45 --    METS 2.89 --    RPE 8 --    Perceived Dyspnea  0 0    VO2 Peak 10.1 --    Symptoms No Yes (comment)    Comments -- Bilateral knne pain 3/10    Resting HR 91 bpm 99 bpm    Resting BP 102/64 108/70    Resting Oxygen Saturation  98 % 96 %    Exercise Oxygen Saturation  during 6 min walk 97 % 100 %    Max Ex. HR 102 bpm 112 bpm    Max Ex. BP 100/60 92/58    2 Minute Post BP 96/60  Recheck after water, 110/74 106/60             Psychological, QOL, Others - Outcomes: PHQ 2/9: Depression screen [facility] 2/9 02/20/2021 12/13/2020 01/03/2017 12/12/2015 11/21/2015  Decreased Interest 0 0 0 0 0  Down, Depressed, Hopeless 0 0 0 0 1  PHQ - 2 Score 0 0 0 0 1  Altered sleeping - - - - 0  Tired, decreased energy  - - - - 1  Change in appetite - - - - 1  Feeling bad or failure about yourself  - - - - 1  Trouble concentrating - - - - 1  Moving slowly or fidgety/restless - - - - 0  Suicidal thoughts - - - - 0  PHQ-9 Score - - - - 5  Difficult doing work/chores - - - - Somewhat difficult    Quality of Life:  Quality of Life - 02/08/21 1521       Quality of Life   Select Quality of Life      Quality of Life Scores   Health/Function Pre 27.07 %    Health/Function Post 24.3 %    Health/Function % Change -10.23 %    Socioeconomic Pre 25.93 %    Socioeconomic Post 25.5 %    Socioeconomic % Change  -1.66 %    Psych/Spiritual Pre 26 %    Psych/Spiritual Post 24.5 %    Psych/Spiritual % Change -5.77 %    Family Pre 25.2 %    Family Post 29.5 %    Family % Change 17.06 %    GLOBAL Pre 26.35 %    GLOBAL Post 25.35 %    GLOBAL % Change -3.8 %             Personal Goals: Goals established at orientation with interventions provided to work toward goal.  Personal Goals and Risk Factors at Admission - 12/13/20 1433       Core Components/Risk Factors/Patient Goals on Admission    Weight Management Yes;Weight Loss    Intervention Weight Management: Develop a combined nutrition and exercise program designed to reach desired caloric intake, while maintaining appropriate intake of nutrient and fiber, sodium and fats, and appropriate energy expenditure required for the weight goal.;Weight Management: Provide  education and appropriate resources to help participant work on and attain dietary goals.;Weight Management/Obesity: Establish reasonable short term and long term weight goals.    Admit Weight 194 lb 3.6 oz (88.1 kg)    Goal Weight: Long Term 169 lb (76.7 kg)   Pt's goal   Expected Outcomes Short Term: Continue to assess and modify interventions until short term weight is achieved;Long Term: Adherence to nutrition and physical activity/exercise program aimed toward attainment of established  weight goal;Weight Maintenance: Understanding of the daily nutrition guidelines, which includes 25-35% calories from fat, 7% or less cal from saturated fats, less than 236m cholesterol, less than 1.5gm of sodium, & 5 or more servings of fruits and vegetables daily;Weight Loss: Understanding of general recommendations for a balanced deficit meal plan, which promotes 1-2 lb weight loss per week and includes a negative energy balance of [phone] kcal/d;Understanding recommendations for meals to include 15-35% energy as protein, 25-35% energy from fat, 35-60% energy from carbohydrates, less than 2039mof dietary cholesterol, 20-35 gm of total fiber daily;Understanding of distribution of calorie intake throughout the day with the consumption of 4-5 meals/snacks    Lipids Yes    Intervention Provide education and support for participant on nutrition & aerobic/resistive exercise along with prescribed medications to achieve LDL <7048mHDL >36m34m  Expected Outcomes Short Term: Participant states understanding of desired cholesterol values and is compliant with medications prescribed. Participant is following exercise prescription and nutrition guidelines.;Long Term: Cholesterol controlled with medications as prescribed, with individualized exercise RX and with personalized nutrition plan. Value goals: LDL < 70mg77mL > 40 mg.              Personal Goals Discharge:  Goals and Risk Factor Review     Row Name 12/21/20 1440 01/17/21 1624 02/20/21 1419         Core Components/Risk Factors/Patient Goals Review   Personal Goals Review Weight Management/Obesity;Lipids Weight Management/Obesity;Lipids Weight Management/Obesity;Lipids     Review Rich started exercise at cardiac rehab on 12/21/20. Lorimer did well with exercise. Vital signs stable. Dartanyan Marisadone well with exercise. Blessing's resting heart rate has been in the upper 90's to low 100's resting. Telemetry rhythm remains sinus rhythm, sinus tach. Will  continue to monitor. Arlyn did well with exercise. Joseluis completed phase 2 cardiac rehab on 02/10/21.     Expected Outcomes Shray Farina continue to participate in phase 2 cardiac rehab for exercise, nutrition and lifestyle modifications Gershon Da continue to participate in phase 2 cardiac rehab for exercise, nutrition and lifestyle modifications Caelen Anil continue to  exercise,  follow nutrition and lifestyle modifications upon completion of phase 2 cardiac rehab.              Exercise Goals and Review:  Exercise Goals     Row Name 12/13/20 1439             Exercise Goals   Increase Physical Activity Yes       Intervention Provide advice, education, support and counseling about physical activity/exercise needs.;Develop an individualized exercise prescription for aerobic and resistive training based on initial evaluation findings, risk stratification, comorbidities and participant's personal goals.       Expected Outcomes Long Term: Exercising regularly at least 3-5 days a week.;Long Term: Add in home exercise to make exercise part of routine and to increase amount of physical activity.;Short Term: Attend rehab on a regular basis to increase amount of physical activity.       Increase Strength  and Stamina Yes       Intervention Provide advice, education, support and counseling about physical activity/exercise needs.;Develop an individualized exercise prescription for aerobic and resistive training based on initial evaluation findings, risk stratification, comorbidities and participant's personal goals.       Expected Outcomes Short Term: Increase workloads from initial exercise prescription for resistance, speed, and METs.;Short Term: Perform resistance training exercises routinely during rehab and add in resistance training at home;Long Term: Improve cardiorespiratory fitness, muscular endurance and strength as measured by increased METs and functional capacity (6MWT)       Able to understand  and use rate of perceived exertion (RPE) scale Yes       Intervention Provide education and explanation on how to use RPE scale       Expected Outcomes Short Term: Able to use RPE daily in rehab to express subjective intensity level;Long Term:  Able to use RPE to guide intensity level when exercising independently       Knowledge and understanding of Target Heart Rate Range (THRR) Yes       Intervention Provide education and explanation of THRR including how the numbers were predicted and where they are located for reference       Expected Outcomes Short Term: Able to state/look up THRR;Long Term: Able to use THRR to govern intensity when exercising independently;Short Term: Able to use daily as guideline for intensity in rehab       Understanding of Exercise Prescription Yes       Intervention Provide education, explanation, and written materials on patient's individual exercise prescription       Expected Outcomes Short Term: Able to explain program exercise prescription;Long Term: Able to explain home exercise prescription to exercise independently                Exercise Goals Re-Evaluation:  Exercise Goals Re-Evaluation     [place] Name 12/21/20 1424 01/11/21 1455 02/10/21 1510         Exercise Goal Re-Evaluation   Exercise Goals Review Increase Physical Activity;Increase Strength and Stamina;Able to understand and use rate of perceived exertion (RPE) scale;Knowledge and understanding of Target Heart Rate Range (THRR);Understanding of Exercise Prescription Increase Physical Activity;Increase Strength and Stamina;Able to understand and use rate of perceived exertion (RPE) scale;Knowledge and understanding of Target Heart Rate Range (THRR);Able to check pulse independently;Understanding of Exercise Prescription Increase Physical Activity;Increase Strength and Stamina;Able to understand and use rate of perceived exertion (RPE) scale;Knowledge and understanding of Target Heart Rate Range  (THRR);Able to check pulse independently;Understanding of Exercise Prescription     Comments Pt's first day in the [facility] program. Pt understands the exercise Rx, RPE scale and THRR. Reviewed METs, Goals and home exercise Rx. Pt voices he dosen't feel that he sees an increase in his strength and stamina. Average METs were 3.0 on day one, pt now has an average MET level of 4.25 witha peak of 5.5 METs. Pt graduated from the [facility] program. Pt made good progress and improved his strength and stamina demonstrated by peak METs of 10.25 and an average 6.68. Pt will continue to exercise at home by walking and using his stationary bike.     Expected Outcomes Will continue to monitor patient and progress exercise workloads as tolerated. Will continue to monitor patient and progress exercise workloads as tolerated. Pt will begin to exercise 2x/week on his stationary bike. Pt will work himself up to 30 minutes on those sessions. Pt graduated from the [facility] program. Pt improved  on his post 6 minute walk test by 19.06%(247 ft). Pt also increased his grip strength by 22.2% and his flexibity on the sit-and reach-test by 5.56%.              Nutrition & Weight - Outcomes:  Pre Biometrics - 12/13/20 1300       Pre Biometrics   Waist Circumference 44 inches    Hip Circumference 42 inches    Waist to Hip Ratio 1.05 %    Triceps Skinfold 11 mm    % Body Fat 28.9 %    Grip Strength 36 kg    Flexibility 18 in    Single Leg Stand 4.43 seconds             Post Biometrics - 02/06/21 1500        Post  Biometrics   Height 5' 7.75" (1.721 m)    Weight 83.8 kg    Waist Circumference 42 inches    Hip Circumference 41 inches    Waist to Hip Ratio 1.02 %    BMI (Calculated) 28.29    Triceps Skinfold 11 mm    % Body Fat 27.4 %    Grip Strength 42 kg    Flexibility 19 in    Single Leg Stand 4.5 seconds             Nutrition:   Nutrition Discharge:   Education Questionnaire Score:  Knowledge  Questionnaire Score - 02/08/21 1521       Knowledge Questionnaire Score   Post Score 21/24             Goals reviewed with patient; copy given to patient.Pt graduated from cardiac rehab program on 02/10/21 with completion of 21 exercise sessions in Phase II. Pt maintained good attendance and progressed nicely during his participation in rehab as evidenced by increased MET level.   Medication list reconciled. Repeat  PHQ score- 0 .  Pt has made significant lifestyle changes and should be commended for his success. Pt feels he has achieved his goals during cardiac rehab.   Pt plans to continue exercise by walking his dog. Yamir may consider joining the [employer] near his house. Max increased his distance on his post exercise walk test by 247 feet. Richardo lost 4.2 kg. We are proud of Gilmar's progress and weight loss!Barnet Pall, RN,BSN 02/20/2021 2:32 PM

## 2021-03-17 DIAGNOSIS — D6869 Other thrombophilia: Secondary | ICD-10-CM | POA: Diagnosis not present

## 2021-03-17 DIAGNOSIS — I10 Essential (primary) hypertension: Secondary | ICD-10-CM | POA: Diagnosis not present

## 2021-03-17 DIAGNOSIS — N529 Male erectile dysfunction, unspecified: Secondary | ICD-10-CM | POA: Diagnosis not present

## 2021-03-17 DIAGNOSIS — N400 Enlarged prostate without lower urinary tract symptoms: Secondary | ICD-10-CM | POA: Diagnosis not present

## 2021-03-17 DIAGNOSIS — E559 Vitamin D deficiency, unspecified: Secondary | ICD-10-CM | POA: Diagnosis not present

## 2021-03-17 DIAGNOSIS — I4891 Unspecified atrial fibrillation: Secondary | ICD-10-CM | POA: Diagnosis not present

## 2021-04-11 ENCOUNTER — Other Ambulatory Visit (HOSPITAL_COMMUNITY): Payer: Self-pay | Admitting: Urology

## 2021-04-11 ENCOUNTER — Ambulatory Visit (HOSPITAL_COMMUNITY)
Admission: RE | Admit: 2021-04-11 | Discharge: 2021-04-11 | Disposition: A | Discharge status: Home / Self Care | Payer: 59 | Source: Ambulatory Visit | Attending: Urology | Admitting: Urology

## 2021-04-11 DIAGNOSIS — C641 Malignant neoplasm of right kidney, except renal pelvis: Secondary | ICD-10-CM | POA: Insufficient documentation

## 2021-04-11 DIAGNOSIS — M47814 Spondylosis without myelopathy or radiculopathy, thoracic region: Secondary | ICD-10-CM | POA: Diagnosis not present

## 2021-04-11 DIAGNOSIS — I10 Essential (primary) hypertension: Secondary | ICD-10-CM

## 2021-04-11 DIAGNOSIS — Z85528 Personal history of other malignant neoplasm of kidney: Secondary | ICD-10-CM | POA: Diagnosis not present

## 2021-04-19 ENCOUNTER — Telehealth: Payer: Self-pay

## 2021-04-19 NOTE — Telephone Encounter (Signed)
-----   Message from Fay Records, MD sent at 04/17/2021  6:53 PM EDT ----- ?Regarding: RE: Sinus rhythm, Sinus Tach ?Is it possible in May? Or June? ?(May need to add 1/2 day) ?----- Message ----- ?From: Stephani Police, RN ?Sent: 04/17/2021   8:42 AM EDT ?To: Fay Records, MD ?Subject: RE: Sinus rhythm, Sinus Tach                  ? ?HI.Marland Kitchen. it does not look like he has any follow up planned... no recall. When should he come back?  ?Gerardo Caiazzo  ? ?----- Message ----- ?From: Fay Records, MD ?Sent: 04/10/2021   9:41 PM EDT ?To: Stephani Police, RN ?Subject: FW: Sinus rhythm, Sinus Tach                  ? ?When am I due to see pt again ?----- Message ----- ?From: Magda Kiel, RN ?Sent: 01/20/2021   4:32 PM EDT ?To: Fay Records, MD, Stephani Police, RN ?Subject: Sinus rhythm, Sinus Tach                      ? ?Good afternoon Dr Harrington Challenger, ? ?I want to bring it to your attention that  Sherard's Resting heart rate noted in the upper 90's low 100's. Sinus Rhythm, Sinus tach occasional PVC . Jheremy continues to do well with exercise at cardiac rehab. Amari is not on a bet blocker sometimes he has to sit for a few minutes to get his heart rate with 10 beats before leaving. ? ?I had recently faxed his ECG tracings. I just faxed his exercise flow sheets with updated vital signs including today so that you can see how his heart rate are trending. Marlee has stayed within his target of 125.  ? ? ? ? ?Sincerely, ?Barnet Pall RN ? ? ? ? ? ? ? ?

## 2021-04-19 NOTE — Telephone Encounter (Signed)
Left a message for the pt to call back to make him a May/ June follow up visit with [name] per her request.  ?

## 2021-04-24 NOTE — Telephone Encounter (Signed)
Appt made for the pt on 05/23/21.  ?

## 2021-04-27 DIAGNOSIS — N289 Disorder of kidney and ureter, unspecified: Secondary | ICD-10-CM | POA: Diagnosis not present

## 2021-04-27 DIAGNOSIS — K449 Diaphragmatic hernia without obstruction or gangrene: Secondary | ICD-10-CM | POA: Diagnosis not present

## 2021-04-27 DIAGNOSIS — Z85528 Personal history of other malignant neoplasm of kidney: Secondary | ICD-10-CM | POA: Diagnosis not present

## 2021-04-27 DIAGNOSIS — C641 Malignant neoplasm of right kidney, except renal pelvis: Secondary | ICD-10-CM | POA: Diagnosis not present

## 2021-04-27 DIAGNOSIS — K760 Fatty (change of) liver, not elsewhere classified: Secondary | ICD-10-CM | POA: Diagnosis not present

## 2021-05-01 DIAGNOSIS — Z905 Acquired absence of kidney: Secondary | ICD-10-CM | POA: Diagnosis not present

## 2021-05-01 DIAGNOSIS — R351 Nocturia: Secondary | ICD-10-CM | POA: Diagnosis not present

## 2021-05-01 DIAGNOSIS — C641 Malignant neoplasm of right kidney, except renal pelvis: Secondary | ICD-10-CM | POA: Diagnosis not present

## 2021-05-01 DIAGNOSIS — D4102 Neoplasm of uncertain behavior of left kidney: Secondary | ICD-10-CM | POA: Diagnosis not present

## 2021-05-02 ENCOUNTER — Other Ambulatory Visit: Payer: Self-pay | Admitting: Internal Medicine

## 2021-05-09 ENCOUNTER — Telehealth: Payer: Self-pay | Admitting: Internal Medicine

## 2021-05-09 ENCOUNTER — Other Ambulatory Visit: Payer: Self-pay | Admitting: Urology

## 2021-05-09 NOTE — Telephone Encounter (Signed)
It does not appear the patient currently takes Eliquis. Per MyChart message on 01/12/21, [name] said he could finish his current supply of Eliquis then stop. Patient reported not taking Eliquis on 02/10/21. Per fill records he has not filled this since January.  ? ?Would confirm with patient that he is not taking Eliquis and if so, update his medication list.  ?

## 2021-05-09 NOTE — Telephone Encounter (Signed)
Patient has upcoming follow-up with [name] on 5/23.  Cardiac clearance can be addressed by MD at that time. ? ?Clinical pharmacist to review Eliquis ?

## 2021-05-09 NOTE — Telephone Encounter (Signed)
? ?  Pre-operative Risk Assessment  ?  ?Patient Name: [name]  ?DOB: January 10, 1955 ?MRN: [id number]  ? ?  ? ?Request for Surgical Clearance   ? ?Procedure:   partial nephrectomy  ? ?Date of Surgery:  Clearance 06/07/21                              ?   ?Surgeon:  Dr. [name]  ?Surgeon's Group or Practice Name:  [facility] ?Phone number:  [phone] ?Fax number:  [phone] ?  ?Type of Clearance Requested:   ?- Medical  ?- Pharmacy:  Hold Apixaban (Eliquis) 48 hrs prior to ?  ?Type of Anesthesia:  General  ?  ?Additional requests/questions:     ? ?Signed, ?Milbert Coulter   ?05/09/2021, 10:50 AM  ? ?

## 2021-05-10 ENCOUNTER — Other Ambulatory Visit: Payer: Self-pay | Admitting: Physician Assistant

## 2021-05-10 NOTE — Telephone Encounter (Signed)
I spoke with the patient, he has not taken the Eliquis for about a month.  It appears patient only had postop atrial fibrillation.  Based on patient message on 01/12/2021, [name] mentioned that patient has not had any recurrence of A-fib during cardiac rehab and recommended stopping the Eliquis after finishing the current course.  He says the surgeon also told him he need to hold the aspirin prior to the surgery as well, he decided to start holding the aspirin since yesterday.  I informed the patient that aspirin holding time is typically 5 to 7 days and recommended he stay on aspirin until he can be seen by [name].  Surgery is on 06/07/2021. ? ?We will defer final cardiac clearance to MD.  Patient scheduled to see [name] on 5/23. ?

## 2021-05-22 NOTE — Progress Notes (Unsigned)
Cardiology Office Note   Date:  05/23/2021   ID:  [name], DOB [birth year], MRN [id number]  PCP:  Josetta Huddle, MD  Cardiologist:   Dorris Carnes, MD    F/U of mitral valve dz       History of Present Illness: [name] is a 66 y.o. 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 [facility]   Rates controlled   He underwent cardioversion on Sept 14 2022 After that amiodarone was stopped   He has had no recurrence    I saw the pt in Nov 2022 The patient he says he is going to be having surgery on June 7 to remove a mass  on his remaining kidney.  He is concerned about this.  The patient notes occasional chest pains really along the diaphragm.  The episodes are not pleuritic, not associated with particular activity, actually usually occur while he is sitting in a chair.  No shortness of breath.  No radiation.  There does not appear to be an exacerbating factor.  He denies chest pressure or pain at other times.  His breathing is good.  Notes rare dizziness.  Outpatient Medications Prior to Visit  Medication Sig Dispense Refill   amoxicillin (AMOXIL) 500 MG capsule Take 4 capsules  (2,000 mg total) by mouth as directed. 4 pills one hour before dental appointment 4 capsule 2   aspirin 81 MG chewable tablet Chew 81 mg by mouth daily.     buPROPion (WELLBUTRIN XL) 150 MG 24 hr tablet Take 150 mg by mouth daily.     cetirizine (ZYRTEC) 10 MG tablet Take 10 mg by mouth daily as needed for allergies.     cholecalciferol (VITAMIN D3) 25 MCG (1000 UNIT) tablet Take 1,000 Units by mouth daily.     diphenhydramine-acetaminophen (TYLENOL PM) 25-500 MG TABS tablet Take 2 tablets by mouth at bedtime.     finasteride (PROSCAR) 5 MG tablet Take 1 tablet (5 mg total) by mouth daily. 30 tablet 0   losartan (COZAAR) 50 MG tablet Take 50 mg by mouth daily.     Melatonin 10 MG TABS Take 10 mg by mouth at bedtime.      oxybutynin (DITROPAN) 5 MG tablet Take 5 mg by mouth at bedtime.     oxymetazoline (AFRIN) 0.05 % nasal spray Place 1 spray into both nostrils 2 (two) times daily as  needed for congestion.     Probiotic Product (PROBIOTIC DAILY PO) Take 1 tablet by mouth daily.     rosuvastatin (CRESTOR) 10 MG tablet Take 1 tablet (10 mg total) by mouth daily. 90 tablet 3   tamsulosin (FLOMAX) 0.4 MG CAPS capsule Take 0.4 mg by mouth daily.     tadalafil (CIALIS) 20 MG tablet Take 1 tablet by mouth as needed.     amLODipine (NORVASC) 2.5 MG tablet TAKE 1 TABLET BY MOUTH EVERY DAY 90 tablet 1   No facility-administered medications prior to visit.     Allergies:   Patient has no known allergies.   Past Medical History:  Diagnosis Date   Bacteremia 110/2017   Cancer [facility])    renal mass at preop   CVA (cerebral infarction)    hemmorhagic  CVA   Depression    Dyslipidemia    Endocarditis    MV Vegitation   History of kidney stones    Hypertension    Mitral regurgitation    Myocardial infarction [facility])    Stroke [facility])     Past Surgical History:  Procedure Laterality Date   CARDIAC CATHETERIZATION     CARDIOVERSION N/A 09/14/2020   Procedure: CARDIOVERSION;  Surgeon: Elouise Munroe, MD;  Location: [facility];  Service: Cardiovascular;  Laterality: N/A;   NO PAST SURGERIES     RIGHT/LEFT HEART CATH AND CORONARY ANGIOGRAPHY N/A 05/11/2020   Procedure: RIGHT/LEFT HEART CATH AND CORONARY ANGIOGRAPHY;  Surgeon: Sherren Mocha, MD;  Location: [facility] CV LAB;  Service: Cardiovascular;  Laterality: N/A;   ROBOT ASSISTED LAPAROSCOPIC NEPHRECTOMY Right 02/22/2016   Procedure: XI ROBOTIC ASSISTED LAPAROSCOPIC NEPHRECTOMY;  Surgeon: Alexis Frock, MD;  Location: [facility];  Service: Urology;  Laterality: Right;   TEE WITHOUT CARDIOVERSION N/A 01/28/2020   Procedure: TRANSESOPHAGEAL ECHOCARDIOGRAM (TEE);  Surgeon: Fay Records, MD;  Location: [facility] ENDOSCOPY;  Service: Cardiovascular;  Laterality: N/A;   TONSILLECTOMY       Social History:  The patient  reports that he has never smoked. He has never used smokeless tobacco. He reports current alcohol use of about 7.0 standard drinks per week. He reports current drug use. Drug: Marijuana.   Family History:  The patient's family history includes Heart disease in his mother.    ROS:  Please see the history of present illness. All other systems are reviewed and  Negative to the above problem except as noted.    PHYSICAL EXAM: VS:  BP 110/72   Pulse 92   Ht '5\' 8"'$  (1.727 m)   Wt 189 lb 6.4 oz (85.9 kg)   SpO2 96%   BMI 28.80 kg/m   GEN: Obese 66 yo in no acute distress  HEENT: normal  Neck: JVP normal no carotid bruits Cardiac: RRR; No murmurs Chest  Nontender  Respiratory:  clear to auscultation bilaterally,  GI: soft, nontender, nondistended, + BS  No hepatomegaly  MS: no deformity Moving all extremities   Skin: warm and dry, no rash Neuro:  Strength and sensation are intact Psych: euthymic mood, full affect   EKG:  EKG is ordered today.  Sinus rhythm.  92 bpm.  Right bundle branch block.  Left axis deviation.  LVH.    TTE 1. Left ventricular ejection fraction, by estimation, is 55 to 60%. The left  ventricle has normal function. The left ventricle has no regional wall motion abnormalities. Left ventricular diastolic parameters are consistent with Grade I diastolic dysfunction (impaired relaxation). Elevated left ventricular end-diastolic  pressure. 2. Right ventricular systolic function is normal. The right ventricular size is normal. 3. Left atrial size was severely dilated. 4. The mitral valve is abnormal. Moderate to probably severe mitral valve regurgitation. There is moderate late systolic prolapse of multiple scallops of the posterior leaflet of the mitral valve. The regurgitant jet hugs the atrial septum. 5. The aortic valve is tricuspid. Aortic valve regurgitation is mild and is directed toward the anterior mitral leaflet. 6. Aortic dilatation noted. There is mild dilatation of the aortic root, measuring 40 mm. Comparison(s): Changes from prior study are noted. 10/15/2017: 55-60%, mild AI, posterior mitral leaflet prolapse with mild MR, moderate LAE. Conclusion(s)/Recommendation(s): Findings concerning for severe eccentric MR, would recommend Transesophageal Echocardiogram for clarification.   TEE  01/28/20  1. Left ventricular ejection fraction, by estimation, is 60 to 65%. The left ventricle has normal function. The left ventricle has no regional wall motion abnormalities. 2. Right ventricular systolic function is normal. The right ventricular size is normal. 3. Left atrial size was mildly dilated. No left atrial/left atrial appendage thrombus was detected. 4. The mitral valve is abnormal. Carpentier Type II mitral regurgitation. The P3 segment of the valve is partially flail, with prolapse of P2/P3. The mitral regurgitation is severe with a few jets, the most prominent being anteriorly direct, coursing to the back of the left atrium 5. The aortic valve is tricuspid. Aortic valve regurgitation is mild. It starts central and is posteriorly directed. 6. Mild fixed  atherosclerotic plaquing in thoracic aorta.   L heart cath5/22  Conclusion  1.  Widely patent coronary arteries with minimal irregularity, right dominant 2.  Normal/low intracardiac filling pressures with no evidence of pulmonary hypertension 3.  Preserved cardiac output Lipid Panel    Component Value Date/Time   CHOL 151 12/13/2017 1147   TRIG 114 12/13/2017 1147   HDL 53 12/13/2017 1147   CHOLHDL 2.8 12/13/2017 1147   CHOLHDL 4 01/19/2010 0904   VLDL 22.4 01/19/2010 0904   LDLCALC 75 12/13/2017 1147   LDLDIRECT 164.1 01/19/2010 0904      Wt Readings from Last 3 Encounters:  05/23/21 189 lb 6.4 oz (85.9 kg)  02/06/21 184 lb 11.9 oz (83.8 kg)  12/13/20 194 lb 3.6 oz (88.1 kg)      ASSESSMENT AND PLAN:  1.  Mitral valve disease.  The patient is status post repair at [facility] last summer (2022).  He has done very well.  He completed cardiac rehab.  He had an echocardiogram at [facility] showed LVEF was normal valve was functioning well.  RV was noted to be mildly down at that time.  I would like to get an echocardiogram to have here in our system.   2.  Chest pain.  Very atypica.l I am not sure what it represents.  Question GI.  I do not think cardiac.  We will get the CT scan he had done for kidneys and review.  May have imaged upper part of abdomen. Pt had normal coronary arteries at caht 1 year ago    PAF.  Patient had atrial fibrillation after surgery.  He was treated with cardioversion and amiodarone.  He has been off of it and has done well.  He is no longer on anticoagulation.  3.  Hypertension.  This is better.  He has been taken off of amlodipine.  His blood pressure is excellent.  4.  Dyslipidemia.  Last labs LDL 105, HDL 50.  Triglycerides 106.  He has no known coronary disease.  Continue Crestor 2 stabilize development of any lesions.  5.  Renal.  Patient is status post nephrectomy.  Now with a lesion on the remaining kidney.  Seen by urology CT scan was done.  Due to  have surgery.  To remove.  I will get the records from Dr. Bess Harvest also make sure the Dr. Marval Regal is notified. From a cardiac standpoint he should do well.  Plan was to stop aspirin 5 days prior.  Resume after.    Current medicines are reviewed at length with the patient today.  The patient does not have concerns regarding medicines.  Signed, Dorris Carnes, MD  05/23/2021 10:30 AM    [facility] [address], [place], [place]  [address] Phone: [phone]; Fax: [phone]

## 2021-05-23 ENCOUNTER — Encounter: Payer: Self-pay | Admitting: Internal Medicine

## 2021-05-23 ENCOUNTER — Ambulatory Visit: Payer: 59 | Admitting: Internal Medicine

## 2021-05-23 VITALS — BP 110/72 | HR 92 | Ht 68.0 in | Wt 189.4 lb

## 2021-05-23 DIAGNOSIS — I340 Nonrheumatic mitral (valve) insufficiency: Secondary | ICD-10-CM | POA: Diagnosis not present

## 2021-05-23 NOTE — Patient Instructions (Addendum)
Medication Instructions:   *If you need a refill on your cardiac medications before your next appointment, please call your pharmacy*   Lab Work:  If you have labs (blood work) drawn today and your tests are completely normal, you will receive your results only by: [place] (if you have MyChart) OR A paper copy in the mail If you have any lab test that is abnormal or we need to change your treatment, we will call you to review the results.   Testing/Procedures: Your physician has requested that you have an echocardiogram. Echocardiography is a painless test that uses sound waves to create images of your heart. It provides your doctor with information about the size and shape of your heart and how well your heart's chambers and valves are working. This procedure takes approximately one hour. There are no restrictions for this procedure.    Follow-Up: LATE September 2023 WITH DR ROSS  At [facility], you and your health needs are our priority.  As part of our continuing mission to provide you with exceptional heart care, we have created designated Provider Care Teams.  These Care Teams include your primary Cardiologist (physician) and Advanced Practice Providers (APPs -  Physician Assistants and Nurse Practitioners) who all work together to provide you with the care you need, when you need it.  We recommend signing up for the patient portal called "MyChart".  Sign up information is provided on this After Visit Summary.  MyChart is used to connect with patients for Virtual Visits (Telemedicine).  Patients are able to view lab/test results, encounter notes, upcoming appointments, etc.  Non-urgent messages can be sent to your provider as well.   To learn more about what you can do with MyChart, go to [email or web address].     Important Information About Sugar

## 2021-05-24 ENCOUNTER — Encounter (HOSPITAL_COMMUNITY): Payer: 59

## 2021-05-26 NOTE — Patient Instructions (Addendum)
DUE TO COVID-19 ONLY TWO VISITORS  (aged 65 and older)  IS ALLOWED TO COME WITH YOU AND STAY IN THE WAITING ROOM ONLY DURING PRE OP AND PROCEDURE.   **NO VISITORS ARE ALLOWED IN THE SHORT STAY AREA OR RECOVERY ROOM!!**  IF YOU WILL BE ADMITTED INTO THE HOSPITAL YOU ARE ALLOWED ONLY FOUR SUPPORT PEOPLE DURING VISITATION HOURS ONLY (7 AM -8PM)   The support person(s) must pass our screening, gel in and out Visitors GUEST BADGE MUST BE WORN VISIBLY  One adult visitor may remain with you overnight and MUST be in the room by 8 P.M.   You are not required to [employer] often Do NOT share personal items Notify your provider if you are in close contact with someone who has COVID or you develop fever 100.4 or greater, new onset of sneezing, cough, sore throat, shortness of breath or body aches.        Your procedure is scheduled on:  June 07, 2021   Report to [facility] Main Entrance    Report to admitting at 9:45 AM   Call this number if you have problems the morning of surgery [phone]   Follow a Clear liquid diet the day before surgery   After Midnight you may have the following liquids until 9:00 AM DAY OF SURGERY  Clear Liquid Diet Water Black Coffee (sugar ok, NO MILK/CREAM OR CREAMERS)  Tea (sugar ok, NO MILK/CREAM OR CREAMERS) regular and decaf                             Plain Jell-O (NO RED)                                           Fruit ices (not with fruit pulp, NO RED)                                     Popsicles (NO RED)                                                                  Juice: apple, WHITE grape, WHITE cranberry Sports drinks like Gatorade (NO RED) Clear broth(vegetable,chicken,beef)                If you have questions, please contact your surgeon's office.   FOLLOW BOWEL PREP AND ANY ADDITIONAL PRE OP INSTRUCTIONS YOU RECEIVED FROM YOUR SURGEON'S OFFICE!!!  Miralax 255g At 4 pm the day before surgery, mix contents of container  with 64 ounces Gatorade.   Oral Hygiene is also important to reduce your risk of infection.                                    Remember - BRUSH YOUR TEETH THE MORNING OF SURGERY WITH YOUR REGULAR TOOTHPASTE   Do NOT smoke after Midnight   Take these medicines the morning of surgery with A SIP OF WATER: Wellbutrin, Proscar, Crestor, Flomax,  Zyrtec prn, Afrin prn                             You may not have any metal on your body including hair pins, jewelry, and body piercing             Do not wear make-up, lotions, powders, cologne, or deodorant              Men may shave face and neck.   Do not bring valuables to the hospital. [place].   Contacts, dentures or bridgework may not be worn into surgery.   Bring small overnight bag day of surgery.   Please read over the following fact sheets you were given: IF YOU HAVE QUESTIONS ABOUT YOUR PRE-OP INSTRUCTIONS PLEASE CALL [facility] - Preparing for Surgery Before surgery, you can play an important role.  Because skin is not sterile, your skin needs to be as free of germs as possible.  You can reduce the number of germs on your skin by washing with CHG (chlorahexidine gluconate) soap before surgery.  CHG is an antiseptic cleaner which kills germs and bonds with the skin to continue killing germs even after washing. Please DO NOT use if you have an allergy to CHG or antibacterial soaps.  If your skin becomes reddened/irritated stop using the CHG and inform your nurse when you arrive at Short Stay. Do not shave (including legs and underarms) for at least 48 hours prior to the first CHG shower.  You may shave your face/neck.  Please follow these instructions carefully:  1.  Shower with CHG Soap the night before surgery and the  morning of surgery.  2.  If you choose to wash your hair, wash your hair first as usual with your normal  shampoo.  3.  After you shampoo, rinse your hair and  body thoroughly to remove the shampoo.                             4.  Use CHG as you would any other liquid soap.  You can apply chg directly to the skin and wash.  Gently with a scrungie or clean washcloth.  5.  Apply the CHG Soap to your body ONLY FROM THE NECK DOWN.   Do   not use on face/ open                           Wound or open sores. Avoid contact with eyes, ears mouth and   genitals (private parts).                       Wash face,  Genitals (private parts) with your normal soap.             6.  Wash thoroughly, paying special attention to the area where your    surgery  will be performed.  7.  Thoroughly rinse your body with warm water from the neck down.  8.  DO NOT shower/wash with your normal soap after using and rinsing off the CHG Soap.                9.  Pat yourself dry with a clean towel.  10.  Wear clean pajamas.            11.  Place clean sheets on your bed the night of your first shower and do not  sleep with pets. Day of Surgery : Do not apply any lotions/deodorants the morning of surgery.  Please wear clean clothes to the hospital/surgery center.  FAILURE TO FOLLOW THESE INSTRUCTIONS MAY RESULT IN THE CANCELLATION OF YOUR SURGERY  PATIENT SIGNATURE_________________________________  NURSE SIGNATURE__________________________________  ________________________________________________________________________  WHAT IS A BLOOD TRANSFUSION? Blood Transfusion Information  A transfusion is the replacement of blood or some of its parts. Blood is made up of multiple cells which provide different functions. Red blood cells carry oxygen and are used for blood loss replacement. White blood cells fight against infection. Platelets control bleeding. Plasma helps clot blood. Other blood products are available for specialized needs, such as hemophilia or other clotting disorders. BEFORE THE TRANSFUSION  Who gives blood for transfusions?  Healthy volunteers who are  fully evaluated to make sure their blood is safe. This is blood bank blood. Transfusion therapy is the safest it has ever been in the practice of medicine. Before blood is taken from a donor, a complete history is taken to make sure that person has no history of diseases nor engages in risky social behavior (examples are intravenous drug use or sexual activity with multiple partners). The donor's travel history is screened to minimize risk of transmitting infections, such as malaria. The donated blood is tested for signs of infectious diseases, such as HIV and hepatitis. The blood is then tested to be sure it is compatible with you in order to minimize the chance of a transfusion reaction. If you or a relative donates blood, this is often done in anticipation of surgery and is not appropriate for emergency situations. It takes many days to process the donated blood. RISKS AND COMPLICATIONS Although transfusion therapy is very safe and saves many lives, the main dangers of transfusion include:  Getting an infectious disease. Developing a transfusion reaction. This is an allergic reaction to something in the blood you were given. Every precaution is taken to prevent this. The decision to have a blood transfusion has been considered carefully by your caregiver before blood is given. Blood is not given unless the benefits outweigh the risks. AFTER THE TRANSFUSION Right after receiving a blood transfusion, you will usually feel much better and more energetic. This is especially true if your red blood cells have gotten low (anemic). The transfusion raises the level of the red blood cells which carry oxygen, and this usually causes an energy increase. The nurse administering the transfusion will monitor you carefully for complications. HOME CARE INSTRUCTIONS  No special instructions are needed after a transfusion. You may find your energy is better. Speak with your caregiver about any limitations on activity for  underlying diseases you may have. SEEK MEDICAL CARE IF:  Your condition is not improving after your transfusion. You develop redness or irritation at the intravenous (IV) site. SEEK IMMEDIATE MEDICAL CARE IF:  Any of the following symptoms occur over the next 12 hours: Shaking chills. You have a temperature by mouth above 102 F (38.9 C), not controlled by medicine. Chest, back, or muscle pain. People around you feel you are not acting correctly or are confused. Shortness of breath or difficulty breathing. Dizziness and fainting. You get a rash or develop hives. You have a decrease in urine output. Your urine turns a dark color or changes to pink,  red, or brown. Any of the following symptoms occur over the next 10 days: You have a temperature by mouth above 102 F (38.9 C), not controlled by medicine. Shortness of breath. Weakness after normal activity. The white part of the eye turns yellow (jaundice). You have a decrease in the amount of urine or are urinating less often. Your urine turns a dark color or changes to pink, red, or brown. Document Released: 12/16/1999 Document Revised: 03/12/2011 Document Reviewed: 08/04/2007 [employer] Patient Information 2014 [place], [place].  _______________________________________________________________________

## 2021-05-26 NOTE — Progress Notes (Addendum)
COVID Vaccine Completed:  Date of COVID positive in last 90 days:  PCP - Josetta Huddle, MD Cardiologist - Dorris Carnes, MD   Cardiac clearance; Epic 05-23-2021  Chest x-ray -04-11-21 EPIC EKG - 05-23-21 EPIC Stress Test - greater than 2 years  EPIC ECHO - 05-30-21 ? Cardiac Cath - 05-11-20  EPIC Pacemaker/ICD device last checked: Spinal Cord Stimulator:  Bowel Prep -   Sleep Study -  CPAP -   Fasting Blood Sugar -  Checks Blood Sugar _____ times a day  Blood Thinner Instructions: Aspirin Instructions:Aspirin 81 mg.  Hold 5 days prior per clearance  Activity level:  Can go up a flight of stairs and perform activities of daily living without stopping and without symptoms of chest pain or shortness of breath.   Able to exercise without symptoms  Unable to go up a flight of stairs without symptoms of      Anesthesia review: Hx MI, endocarditis, MVP, A. Fib,Cerebral embolism, Hx DVT, seizure disorder (Last:      ) hx MVR  Patient denies shortness of breath, fever, cough and chest pain at PAT appointment   Patient verbalized understanding of instructions that were given to them at the PAT appointment. Patient was also instructed that they will need to review over the PAT instructions again at home before surgery.

## 2021-05-30 ENCOUNTER — Ambulatory Visit (INDEPENDENT_AMBULATORY_CARE_PROVIDER_SITE_OTHER): Payer: 59

## 2021-05-30 DIAGNOSIS — I340 Nonrheumatic mitral (valve) insufficiency: Secondary | ICD-10-CM | POA: Diagnosis not present

## 2021-05-30 LAB — ECHOCARDIOGRAM COMPLETE
AR max vel: 3.09 cm2
AV Area VTI: 3.18 cm2
AV Area mean vel: 3.18 cm2
AV Mean grad: 3 mmHg
AV Peak grad: 5.2 mmHg
AV Vena cont: 0.44 cm
Ao pk vel: 1.14 m/s
Area-P 1/2: 2.21 cm2
Calc EF: 51.2 %
MV VTI: 1.98 cm2
P 1/2 time: 487 msec
S' Lateral: 3.09 cm
Single Plane A2C EF: 44.8 %
Single Plane A4C EF: 55.6 %

## 2021-05-31 ENCOUNTER — Encounter: Payer: Self-pay | Admitting: Internal Medicine

## 2021-05-31 ENCOUNTER — Encounter (HOSPITAL_COMMUNITY)
Admission: RE | Admit: 2021-05-31 | Discharge: 2021-05-31 | Disposition: A | Discharge status: Home / Self Care | Payer: 59 | Source: Ambulatory Visit | Attending: Urology | Admitting: Urology

## 2021-05-31 ENCOUNTER — Encounter (HOSPITAL_COMMUNITY): Payer: Self-pay

## 2021-05-31 ENCOUNTER — Other Ambulatory Visit: Payer: Self-pay

## 2021-05-31 VITALS — BP 129/82 | HR 87 | Temp 97.9°F | Resp 20 | Ht 68.0 in | Wt 189.0 lb

## 2021-05-31 DIAGNOSIS — Z8679 Personal history of other diseases of the circulatory system: Secondary | ICD-10-CM | POA: Insufficient documentation

## 2021-05-31 DIAGNOSIS — I340 Nonrheumatic mitral (valve) insufficiency: Secondary | ICD-10-CM | POA: Insufficient documentation

## 2021-05-31 DIAGNOSIS — F1090 Alcohol use, unspecified, uncomplicated: Secondary | ICD-10-CM | POA: Diagnosis not present

## 2021-05-31 DIAGNOSIS — I10 Essential (primary) hypertension: Secondary | ICD-10-CM | POA: Diagnosis not present

## 2021-05-31 DIAGNOSIS — Z8673 Personal history of transient ischemic attack (TIA), and cerebral infarction without residual deficits: Secondary | ICD-10-CM | POA: Diagnosis not present

## 2021-05-31 DIAGNOSIS — Z9889 Other specified postprocedural states: Secondary | ICD-10-CM | POA: Diagnosis not present

## 2021-05-31 DIAGNOSIS — I2510 Atherosclerotic heart disease of native coronary artery without angina pectoris: Secondary | ICD-10-CM | POA: Diagnosis not present

## 2021-05-31 DIAGNOSIS — Z01812 Encounter for preprocedural laboratory examination: Secondary | ICD-10-CM | POA: Diagnosis not present

## 2021-05-31 DIAGNOSIS — Z01818 Encounter for other preprocedural examination: Secondary | ICD-10-CM

## 2021-05-31 DIAGNOSIS — N2889 Other specified disorders of kidney and ureter: Secondary | ICD-10-CM | POA: Insufficient documentation

## 2021-05-31 DIAGNOSIS — R69 Illness, unspecified: Secondary | ICD-10-CM | POA: Diagnosis not present

## 2021-05-31 DIAGNOSIS — Z789 Other specified health status: Secondary | ICD-10-CM

## 2021-05-31 LAB — CBC
HCT: 46.4 % (ref 39.0–52.0)
Hemoglobin: 15 g/dL (ref 13.0–17.0)
MCH: 29.6 pg (ref 26.0–34.0)
MCHC: 32.3 g/dL (ref 30.0–36.0)
MCV: 91.5 fL (ref 80.0–100.0)
Platelets: 225 10*3/uL (ref 150–400)
RBC: 5.07 MIL/uL (ref 4.22–5.81)
RDW: 13.5 % (ref 11.5–15.5)
WBC: 5.2 10*3/uL (ref 4.0–10.5)
nRBC: 0 % (ref 0.0–0.2)

## 2021-05-31 LAB — COMPREHENSIVE METABOLIC PANEL
ALT: 14 U/L (ref 0–44)
AST: 17 U/L (ref 15–41)
Albumin: 4.6 g/dL (ref 3.5–5.0)
Alkaline Phosphatase: 52 U/L (ref 38–126)
Anion gap: 5 (ref 5–15)
BUN: 29 mg/dL — ABNORMAL HIGH (ref 8–23)
CO2: 24 mmol/L (ref 22–32)
Calcium: 9.6 mg/dL (ref 8.9–10.3)
Chloride: 112 mmol/L — ABNORMAL HIGH (ref 98–111)
Creatinine, Ser: 1.45 mg/dL — ABNORMAL HIGH (ref 0.61–1.24)
GFR, Estimated: 54 mL/min — ABNORMAL LOW (ref >60)
Glucose, Bld: 104 mg/dL — ABNORMAL HIGH (ref 70–99)
Potassium: 4.6 mmol/L (ref 3.5–5.1)
Sodium: 141 mmol/L (ref 135–145)
Total Bilirubin: 0.9 mg/dL (ref 0.3–1.2)
Total Protein: 7.7 g/dL (ref 6.5–8.1)

## 2021-06-01 DIAGNOSIS — I10 Essential (primary) hypertension: Secondary | ICD-10-CM | POA: Diagnosis not present

## 2021-06-01 DIAGNOSIS — N529 Male erectile dysfunction, unspecified: Secondary | ICD-10-CM | POA: Diagnosis not present

## 2021-06-01 NOTE — Progress Notes (Signed)
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 never smoker with h/o CVA, HTN, CAD, MV replacement, left renal mass scheduled for above procedure 06/07/2021 with Dr. Alexis Frock.   Pt had an episode of atrial fibrillation post operatively. No recurrence.   Pt last seen by cardiology 05/23/2021. Per OV note, "Mitral valve disease.  The patient is status post repair at [facility] last summer (2022).  He has done very well.  He completed cardiac rehab.  He had an echocardiogram at [facility] showed LVEF was normal valve was functioning well.  RV was noted to be mildly down at that time.  I would like to get an echocardiogram to have here in our system."  Per [name] in regards to surgery, "From a cardiac standpoint he should do well."  Echo 05/30/2021 mitral valve s/p repair, mean gradient 3 mmHg, trivial mitral valve regurgitation.   Anticipate pt can proceed with planned procedure barring acute status change.   VS: BP 129/82   Pulse 87   Temp 36.6 C (Oral)   Resp 20   Ht '5\' 8"'$  (1.727 m)   Wt 85.7 kg   SpO2 97%   BMI 28.74 kg/m   PROVIDERS: Josetta Huddle, MD is PCP   Cardiologist:   Dorris Carnes, MD  LABS: Labs reviewed: Acceptable for surgery. (all labs ordered are listed, but only abnormal results are displayed)  Labs Reviewed  COMPREHENSIVE METABOLIC PANEL - Abnormal; Notable for the following components:      Result Value   Chloride 112 (*)    Glucose, Bld 104 (*)    BUN 29 (*)    Creatinine, Ser 1.45 (*)    GFR, Estimated 54 (*)    All other components within normal limits  CBC  TYPE AND SCREEN     IMAGES:   EKG: 05/23/21 Rate 92 bpm  NSR RBBB Minimal voltage criteria for LVH, may be normal variant   CV: Echo  05/30/2021 1. Left ventricular ejection fraction, by estimation, is 55%%. The left  ventricle has normal function. The left ventricle has no regional wall  motion abnormalities. Left ventricular diastolic parameters are consistent  with Grade I diastolic dysfunction  (impaired relaxation). Elevated left atrial pressure.   2. Right ventricular systolic function is normal. The right ventricular  size is normal.   3. Left atrial size was mild to moderately dilated.   4. Right atrial size was mild to moderately dilated.   5. S/p MV repair. Mean gradient through the valve is 3 mm Hg. . The  mitral valve has been repaired/replaced. Trivial mitral valve  regurgitation.   6. The aortic valve is tricuspid. Aortic valve regurgitation is mild.  Aortic valve sclerosis/calcification is present, without any evidence of  aortic stenosis.   7. Aortic dilatation noted. There is mild dilatation of the aortic root,  measuring 43 mm. There is borderline dilatation of the ascending aorta,  measuring 38 mm.   Cardiac Cath 05/11/20 1.  Widely patent coronary arteries with minimal irregularity, right dominant 2.  Normal/low intracardiac filling pressures with no evidence of pulmonary hypertension 3.  Preserved cardiac output Past Medical History:  Diagnosis Date   Bacteremia 110/2017   Cancer (  [place])    renal mass at preop   CVA (cerebral infarction)    hemmorhagic  CVA   Depression    Dyslipidemia    Endocarditis    MV Vegitation   History of kidney stones    Hypertension    Mitral regurgitation    Myocardial infarction [facility])    Stroke [facility])     Past Surgical History:  Procedure Laterality Date   CARDIAC CATHETERIZATION     CARDIOVERSION N/A 09/14/2020   Procedure: CARDIOVERSION;  Surgeon: Elouise Munroe, MD;  Location: [place];  Service: Cardiovascular;  Laterality: N/A;   MITRAL VALVE REPAIR     NO PAST SURGERIES     RIGHT/LEFT HEART CATH AND CORONARY ANGIOGRAPHY N/A 05/11/2020    Procedure: RIGHT/LEFT HEART CATH AND CORONARY ANGIOGRAPHY;  Surgeon: Sherren Mocha, MD;  Location: [place] CV LAB;  Service: Cardiovascular;  Laterality: N/A;   ROBOT ASSISTED LAPAROSCOPIC NEPHRECTOMY Right 02/22/2016   Procedure: XI ROBOTIC ASSISTED LAPAROSCOPIC NEPHRECTOMY;  Surgeon: Alexis Frock, MD;  Location[facility];  Service: Urology;  Laterality: Right;   TEE WITHOUT CARDIOVERSION N/A 01/28/2020   Procedure: TRANSESOPHAGEAL ECHOCARDIOGRAM (TEE);  Surgeon: Fay Records, MD;  Location: [facility] ENDOSCOPY;  Service: Cardiovascular;  Laterality: N/A;   TONSILLECTOMY      MEDICATIONS:  amoxicillin (AMOXIL) 500 MG capsule   aspirin 81 MG chewable tablet   buPROPion (WELLBUTRIN XL) 150 MG 24 hr tablet   cetirizine (ZYRTEC) 10 MG tablet   cholecalciferol (VITAMIN D3) 25 MCG (1000 UNIT) tablet   diphenhydramine-acetaminophen (TYLENOL PM) 25-500 MG TABS tablet   finasteride (PROSCAR) 5 MG tablet   losartan (COZAAR) 50 MG tablet   Melatonin 10 MG TABS   oxybutynin (DITROPAN) 5 MG tablet   oxymetazoline (AFRIN) 0.05 % nasal spray   Probiotic Product (PROBIOTIC DAILY PO)   rosuvastatin (CRESTOR) 10 MG tablet   tamsulosin (FLOMAX) 0.4 MG CAPS capsule   No current facility-administered medications for this encounter.   Maurice Felix Ward, PA-C [facility]-Surgical Testing [phone]

## 2021-06-01 NOTE — Anesthesia Preprocedure Evaluation (Addendum)
Anesthesia Evaluation  Patient identified by MRN, date of birth, ID band Patient awake    Reviewed: Allergy & Precautions, NPO status , Patient's Chart, lab work & pertinent test results  Airway Mallampati: II  TM Distance: >3 FB Neck ROM: Full    Dental  (+) Dental Advisory Given   Pulmonary neg pulmonary ROS,    breath sounds clear to auscultation       Cardiovascular hypertension, Pt. on medications + Past MI  + Valvular Problems/Murmurs  Rhythm:Regular Rate:Normal     Neuro/Psych CVA    GI/Hepatic negative GI ROS, Neg liver ROS,   Endo/Other  negative endocrine ROS  Renal/GU Renal InsufficiencyRenal disease     Musculoskeletal   Abdominal   Peds  Hematology negative hematology ROS (+)   Anesthesia Other Findings   Reproductive/Obstetrics                            Lab Results  Component Value Date   WBC 5.2 05/31/2021   HGB 15.0 05/31/2021   HCT 46.4 05/31/2021   MCV 91.5 05/31/2021   PLT 225 05/31/2021   Lab Results  Component Value Date   CREATININE 1.45 (H) 05/31/2021   BUN 29 (H) 05/31/2021   NA 141 05/31/2021   K 4.6 05/31/2021   CL 112 (H) 05/31/2021   CO2 24 05/31/2021    Anesthesia Physical Anesthesia Plan  ASA: 3  Anesthesia Plan: General   Post-op Pain Management: Tylenol PO (pre-op)* and Ketamine IV*   Induction: Intravenous  PONV Risk Score and Plan: 2 and Dexamethasone, Ondansetron and Treatment may vary due to age or medical condition  Airway Management Planned: Oral ETT  Additional Equipment: None  Intra-op Plan:   Post-operative Plan: Extubation in OR  Informed Consent: I have reviewed the patients History and Physical, chart, labs and discussed the procedure including the risks, benefits and alternatives for the proposed anesthesia with the patient or authorized representative who has indicated his/her understanding and acceptance.      Dental advisory given  Plan Discussed with: CRNA  Anesthesia Plan Comments:        Anesthesia Quick Evaluation

## 2021-06-07 ENCOUNTER — Ambulatory Visit (HOSPITAL_BASED_OUTPATIENT_CLINIC_OR_DEPARTMENT_OTHER): Payer: 59 | Admitting: Certified Registered"

## 2021-06-07 ENCOUNTER — Other Ambulatory Visit: Payer: Self-pay

## 2021-06-07 ENCOUNTER — Encounter (HOSPITAL_COMMUNITY)
Admission: RE | Disposition: A | Discharge status: Home / Self Care | Payer: Self-pay | Source: Home / Self Care | Attending: Urology

## 2021-06-07 ENCOUNTER — Encounter (HOSPITAL_COMMUNITY): Payer: Self-pay | Admitting: Urology

## 2021-06-07 ENCOUNTER — Observation Stay (HOSPITAL_COMMUNITY)
Admission: RE | Admit: 2021-06-07 | Discharge: 2021-06-09 | Disposition: A | Discharge status: Home / Self Care | Payer: 59 | Attending: Urology | Admitting: Urology

## 2021-06-07 ENCOUNTER — Ambulatory Visit (HOSPITAL_COMMUNITY): Payer: 59 | Admitting: Physician Assistant

## 2021-06-07 DIAGNOSIS — Z8673 Personal history of transient ischemic attack (TIA), and cerebral infarction without residual deficits: Secondary | ICD-10-CM | POA: Insufficient documentation

## 2021-06-07 DIAGNOSIS — I10 Essential (primary) hypertension: Secondary | ICD-10-CM | POA: Insufficient documentation

## 2021-06-07 DIAGNOSIS — N2889 Other specified disorders of kidney and ureter: Secondary | ICD-10-CM | POA: Diagnosis present

## 2021-06-07 DIAGNOSIS — C642 Malignant neoplasm of left kidney, except renal pelvis: Principal | ICD-10-CM | POA: Insufficient documentation

## 2021-06-07 DIAGNOSIS — I252 Old myocardial infarction: Secondary | ICD-10-CM | POA: Diagnosis not present

## 2021-06-07 DIAGNOSIS — N281 Cyst of kidney, acquired: Secondary | ICD-10-CM | POA: Diagnosis not present

## 2021-06-07 HISTORY — PX: ROBOTIC ASSITED PARTIAL NEPHRECTOMY: SHX6087

## 2021-06-07 LAB — TYPE AND SCREEN
ABO/RH(D): A POS
Antibody Screen: NEGATIVE

## 2021-06-07 LAB — HEMOGLOBIN AND HEMATOCRIT, BLOOD
HCT: 48.2 % (ref 39.0–52.0)
Hemoglobin: 15.4 g/dL (ref 13.0–17.0)

## 2021-06-07 SURGERY — NEPHRECTOMY, PARTIAL, ROBOT-ASSISTED
Anesthesia: General | Site: Abdomen | Laterality: Left

## 2021-06-07 MED ORDER — BUPIVACAINE LIPOSOME 1.3 % IJ SUSP
INTRAMUSCULAR | Status: AC
  Filled 2021-06-07: qty 20

## 2021-06-07 MED ORDER — OXYCODONE HCL 5 MG PO TABS
5.0000 mg | ORAL_TABLET | ORAL | Status: DC | PRN
  Administered 2021-06-08 (×2): 5 mg via ORAL
  Filled 2021-06-07 (×3): qty 1

## 2021-06-07 MED ORDER — LIDOCAINE 2% (20 MG/ML) 5 ML SYRINGE
INTRAMUSCULAR | Status: DC | PRN
  Administered 2021-06-07: 60 mg via INTRAVENOUS

## 2021-06-07 MED ORDER — ACETAMINOPHEN 500 MG PO TABS
1000.0000 mg | ORAL_TABLET | Freq: Once | ORAL | Status: AC
  Administered 2021-06-07: 1000 mg via ORAL
  Filled 2021-06-07: qty 2

## 2021-06-07 MED ORDER — DOCUSATE SODIUM 100 MG PO CAPS: 100.0000 mg | ORAL_CAPSULE | Freq: Two times a day (BID) | ORAL | Status: DC

## 2021-06-07 MED ORDER — ONDANSETRON HCL 4 MG/2ML IJ SOLN
INTRAMUSCULAR | Status: AC
  Filled 2021-06-07: qty 2

## 2021-06-07 MED ORDER — FENTANYL CITRATE PF 50 MCG/ML IJ SOSY
25.0000 ug | PREFILLED_SYRINGE | INTRAMUSCULAR | Status: DC | PRN
  Administered 2021-06-07: 50 ug via INTRAVENOUS

## 2021-06-07 MED ORDER — CHLORHEXIDINE GLUCONATE 0.12 % MT SOLN
15.0000 mL | Freq: Once | OROMUCOSAL | Status: AC
  Administered 2021-06-07: 15 mL via OROMUCOSAL

## 2021-06-07 MED ORDER — SUGAMMADEX SODIUM 200 MG/2ML IV SOLN
INTRAVENOUS | Status: DC | PRN
  Administered 2021-06-07: 200 mg via INTRAVENOUS

## 2021-06-07 MED ORDER — PHENYLEPHRINE 80 MCG/ML (10ML) SYRINGE FOR IV PUSH (FOR BLOOD PRESSURE SUPPORT)
PREFILLED_SYRINGE | INTRAVENOUS | Status: AC
  Filled 2021-06-07: qty 20

## 2021-06-07 MED ORDER — DIPHENHYDRAMINE HCL 50 MG/ML IJ SOLN
12.5000 mg | Freq: Four times a day (QID) | INTRAMUSCULAR | Status: DC | PRN
  Administered 2021-06-07: 12.5 mg via INTRAVENOUS
  Filled 2021-06-07: qty 1

## 2021-06-07 MED ORDER — FINASTERIDE 5 MG PO TABS
5.0000 mg | ORAL_TABLET | Freq: Every day | ORAL | Status: DC
  Administered 2021-06-08 – 2021-06-09 (×2): 5 mg via ORAL
  Filled 2021-06-07 (×2): qty 1

## 2021-06-07 MED ORDER — TAMSULOSIN HCL 0.4 MG PO CAPS
0.4000 mg | ORAL_CAPSULE | Freq: Every day | ORAL | Status: DC
  Administered 2021-06-07 – 2021-06-09 (×3): 0.4 mg via ORAL
  Filled 2021-06-07 (×3): qty 1

## 2021-06-07 MED ORDER — FENTANYL CITRATE (PF) 250 MCG/5ML IJ SOLN
INTRAMUSCULAR | Status: AC
  Filled 2021-06-07: qty 5

## 2021-06-07 MED ORDER — ROCURONIUM BROMIDE 10 MG/ML (PF) SYRINGE
PREFILLED_SYRINGE | INTRAVENOUS | Status: AC
  Filled 2021-06-07: qty 10

## 2021-06-07 MED ORDER — ONDANSETRON HCL 4 MG/2ML IJ SOLN
INTRAMUSCULAR | Status: DC | PRN
  Administered 2021-06-07 (×2): 4 mg via INTRAVENOUS

## 2021-06-07 MED ORDER — LIDOCAINE HCL (PF) 2 % IJ SOLN
INTRAMUSCULAR | Status: AC
  Filled 2021-06-07: qty 5

## 2021-06-07 MED ORDER — DOCUSATE SODIUM 100 MG PO CAPS
100.0000 mg | ORAL_CAPSULE | Freq: Two times a day (BID) | ORAL | Status: DC
  Administered 2021-06-07 – 2021-06-09 (×4): 100 mg via ORAL
  Filled 2021-06-07 (×4): qty 1

## 2021-06-07 MED ORDER — PHENYLEPHRINE 80 MCG/ML (10ML) SYRINGE FOR IV PUSH (FOR BLOOD PRESSURE SUPPORT)
PREFILLED_SYRINGE | INTRAVENOUS | Status: DC | PRN
  Administered 2021-06-07: 160 ug via INTRAVENOUS
  Administered 2021-06-07 (×2): 80 ug via INTRAVENOUS
  Administered 2021-06-07 (×3): 160 ug via INTRAVENOUS
  Administered 2021-06-07: 80 ug via INTRAVENOUS

## 2021-06-07 MED ORDER — CEFAZOLIN SODIUM-DEXTROSE 2-4 GM/100ML-% IV SOLN
2.0000 g | INTRAVENOUS | Status: AC
  Administered 2021-06-07: 2 g via INTRAVENOUS
  Filled 2021-06-07: qty 100

## 2021-06-07 MED ORDER — ROCURONIUM BROMIDE 10 MG/ML (PF) SYRINGE
PREFILLED_SYRINGE | INTRAVENOUS | Status: DC | PRN
  Administered 2021-06-07: 40 mg via INTRAVENOUS
  Administered 2021-06-07: 60 mg via INTRAVENOUS

## 2021-06-07 MED ORDER — BUPROPION HCL ER (XL) 150 MG PO TB24
150.0000 mg | ORAL_TABLET | Freq: Every day | ORAL | Status: DC
  Administered 2021-06-08 – 2021-06-09 (×2): 150 mg via ORAL
  Filled 2021-06-07 (×2): qty 1

## 2021-06-07 MED ORDER — PHENYLEPHRINE HCL (PRESSORS) 10 MG/ML IV SOLN
INTRAVENOUS | Status: AC
  Filled 2021-06-07: qty 1

## 2021-06-07 MED ORDER — HYDROCODONE-ACETAMINOPHEN 5-325 MG PO TABS: 1.0000 | ORAL_TABLET | Freq: Four times a day (QID) | ORAL | 0 refills | Status: DC | PRN

## 2021-06-07 MED ORDER — PROPOFOL 10 MG/ML IV BOLUS
INTRAVENOUS | Status: DC | PRN
  Administered 2021-06-07: 150 mg via INTRAVENOUS

## 2021-06-07 MED ORDER — SUCCINYLCHOLINE CHLORIDE 200 MG/10ML IV SOSY
PREFILLED_SYRINGE | INTRAVENOUS | Status: DC | PRN
  Administered 2021-06-07: 120 mg via INTRAVENOUS

## 2021-06-07 MED ORDER — HYDROMORPHONE HCL 1 MG/ML IJ SOLN
0.5000 mg | INTRAMUSCULAR | Status: DC | PRN
  Administered 2021-06-08 (×2): 1 mg via INTRAVENOUS
  Filled 2021-06-07 (×2): qty 1

## 2021-06-07 MED ORDER — MIDAZOLAM HCL 2 MG/2ML IJ SOLN
INTRAMUSCULAR | Status: DC | PRN
  Administered 2021-06-07: 2 mg via INTRAVENOUS

## 2021-06-07 MED ORDER — BUPIVACAINE LIPOSOME 1.3 % IJ SUSP
INTRAMUSCULAR | Status: DC | PRN
  Administered 2021-06-07: 20 mL

## 2021-06-07 MED ORDER — DEXAMETHASONE SODIUM PHOSPHATE 10 MG/ML IJ SOLN
INTRAMUSCULAR | Status: AC
  Filled 2021-06-07: qty 1

## 2021-06-07 MED ORDER — ORAL CARE MOUTH RINSE: 15.0000 mL | Freq: Once | OROMUCOSAL | Status: AC

## 2021-06-07 MED ORDER — ONDANSETRON HCL 4 MG/2ML IJ SOLN
4.0000 mg | INTRAMUSCULAR | Status: DC | PRN
  Administered 2021-06-07 – 2021-06-08 (×2): 4 mg via INTRAVENOUS
  Filled 2021-06-07 (×2): qty 2

## 2021-06-07 MED ORDER — DEXAMETHASONE SODIUM PHOSPHATE 10 MG/ML IJ SOLN
INTRAMUSCULAR | Status: DC | PRN
  Administered 2021-06-07: 4 mg via INTRAVENOUS

## 2021-06-07 MED ORDER — DIPHENHYDRAMINE HCL 12.5 MG/5ML PO ELIX: 12.5000 mg | ORAL_SOLUTION | Freq: Four times a day (QID) | ORAL | Status: DC | PRN

## 2021-06-07 MED ORDER — ROSUVASTATIN CALCIUM 10 MG PO TABS
10.0000 mg | ORAL_TABLET | Freq: Every day | ORAL | Status: DC
  Administered 2021-06-07 – 2021-06-09 (×3): 10 mg via ORAL
  Filled 2021-06-07 (×3): qty 1

## 2021-06-07 MED ORDER — STERILE WATER FOR IRRIGATION IR SOLN
Status: DC | PRN
  Administered 2021-06-07: 1000 mL

## 2021-06-07 MED ORDER — LOSARTAN POTASSIUM 50 MG PO TABS
50.0000 mg | ORAL_TABLET | Freq: Every day | ORAL | Status: DC
  Administered 2021-06-07 – 2021-06-08 (×2): 50 mg via ORAL
  Filled 2021-06-07 (×2): qty 1

## 2021-06-07 MED ORDER — PHENYLEPHRINE HCL-NACL 20-0.9 MG/250ML-% IV SOLN
INTRAVENOUS | Status: DC | PRN
  Administered 2021-06-07: 30 ug/min via INTRAVENOUS

## 2021-06-07 MED ORDER — LACTATED RINGERS IV SOLN: INTRAVENOUS | Status: DC | PRN

## 2021-06-07 MED ORDER — PROPOFOL 10 MG/ML IV BOLUS
INTRAVENOUS | Status: AC
  Filled 2021-06-07: qty 20

## 2021-06-07 MED ORDER — FENTANYL CITRATE (PF) 100 MCG/2ML IJ SOLN
INTRAMUSCULAR | Status: DC | PRN
  Administered 2021-06-07 (×5): 50 ug via INTRAVENOUS

## 2021-06-07 MED ORDER — SODIUM CHLORIDE 0.9% FLUSH
INTRAVENOUS | Status: DC | PRN
  Administered 2021-06-07: 20 mL

## 2021-06-07 MED ORDER — SODIUM CHLORIDE (PF) 0.9 % IJ SOLN
INTRAMUSCULAR | Status: AC
  Filled 2021-06-07: qty 20

## 2021-06-07 MED ORDER — POLYETHYLENE GLYCOL 3350 17 GM/SCOOP PO POWD: 1.0000 | Freq: Once | ORAL | Status: DC

## 2021-06-07 MED ORDER — LACTATED RINGERS IV SOLN: INTRAVENOUS | Status: DC

## 2021-06-07 MED ORDER — SODIUM CHLORIDE 0.9 % IV SOLN: INTRAVENOUS | Status: DC

## 2021-06-07 MED ORDER — MIDAZOLAM HCL 2 MG/2ML IJ SOLN
INTRAMUSCULAR | Status: AC
  Filled 2021-06-07: qty 2

## 2021-06-07 MED ORDER — FENTANYL CITRATE PF 50 MCG/ML IJ SOSY
PREFILLED_SYRINGE | INTRAMUSCULAR | Status: AC
  Filled 2021-06-07: qty 2

## 2021-06-07 MED ORDER — HYOSCYAMINE SULFATE 0.125 MG SL SUBL: 0.1250 mg | SUBLINGUAL_TABLET | SUBLINGUAL | Status: DC | PRN

## 2021-06-07 MED ORDER — LACTATED RINGERS IR SOLN
Status: DC | PRN
  Administered 2021-06-07: 1000 mL

## 2021-06-07 MED ORDER — AMISULPRIDE (ANTIEMETIC) 5 MG/2ML IV SOLN: 10.0000 mg | Freq: Once | INTRAVENOUS | Status: DC | PRN

## 2021-06-07 MED ORDER — ACETAMINOPHEN 500 MG PO TABS
1000.0000 mg | ORAL_TABLET | Freq: Four times a day (QID) | ORAL | Status: AC
  Administered 2021-06-07 – 2021-06-08 (×3): 1000 mg via ORAL
  Filled 2021-06-07 (×4): qty 2

## 2021-06-07 SURGICAL SUPPLY — 79 items
ADH SKN CLS APL DERMABOND .7 (GAUZE/BANDAGES/DRESSINGS) ×1
AGENT HMST KT MTR STRL THRMB (HEMOSTASIS) ×1
APL ESCP 34 STRL LF DISP (HEMOSTASIS) ×1
APL PRP STRL LF DISP 70% ISPRP (MISCELLANEOUS) ×1
APPLICATOR SURGIFLO ENDO (HEMOSTASIS) ×2 IMPLANT
BAG COUNTER SPONGE SURGICOUNT (BAG) IMPLANT
BAG RETRIEVAL 10 (BASKET) ×1
BAG SPNG CNTER NS LX DISP (BAG)
CHLORAPREP W/TINT 26 (MISCELLANEOUS) ×2 IMPLANT
CLIP LIGATING HEM O LOK PURPLE (MISCELLANEOUS) ×4 IMPLANT
CLIP LIGATING HEMO LOK XL GOLD (MISCELLANEOUS) IMPLANT
CLIP LIGATING HEMO O LOK GREEN (MISCELLANEOUS) ×3 IMPLANT
CLIP SUT LAPRA TY ABSORB (SUTURE) ×3 IMPLANT
COVER SURGICAL LIGHT HANDLE (MISCELLANEOUS) ×2 IMPLANT
COVER TIP SHEARS 8 DVNC (MISCELLANEOUS) ×1 IMPLANT
COVER TIP SHEARS 8MM DA VINCI (MISCELLANEOUS) ×2
CUTTER ECHEON FLEX ENDO 45 340 (ENDOMECHANICALS) IMPLANT
DERMABOND ADVANCED (GAUZE/BANDAGES/DRESSINGS) ×1
DERMABOND ADVANCED .7 DNX12 (GAUZE/BANDAGES/DRESSINGS) ×1 IMPLANT
DRAIN CHANNEL 15F RND FF 3/16 (WOUND CARE) ×2 IMPLANT
DRAPE ARM DVNC X/XI (DISPOSABLE) ×4 IMPLANT
DRAPE COLUMN DVNC XI (DISPOSABLE) ×1 IMPLANT
DRAPE DA VINCI XI ARM (DISPOSABLE) ×8
DRAPE DA VINCI XI COLUMN (DISPOSABLE) ×2
DRAPE INCISE IOBAN 66X45 STRL (DRAPES) ×2 IMPLANT
DRAPE SHEET LG 3/4 BI-LAMINATE (DRAPES) ×2 IMPLANT
DRSG TEGADERM 4X4.75 (GAUZE/BANDAGES/DRESSINGS) ×2 IMPLANT
ELECT PENCIL ROCKER SW 15FT (MISCELLANEOUS) ×2 IMPLANT
ELECT REM PT RETURN 15FT ADLT (MISCELLANEOUS) ×2 IMPLANT
EVACUATOR SILICONE 100CC (DRAIN) ×2 IMPLANT
GAUZE 4X4 16PLY ~~LOC~~+RFID DBL (SPONGE) ×2 IMPLANT
GLOVE BIO SURGEON STRL SZ 6.5 (GLOVE) ×2 IMPLANT
GLOVE SURG LX 7.5 STRW (GLOVE) ×2
GLOVE SURG LX STRL 7.5 STRW (GLOVE) ×2 IMPLANT
GOWN SRG XL LVL 4 BRTHBL STRL (GOWNS) ×1 IMPLANT
GOWN STRL NON-REIN XL LVL4 (GOWNS) ×2
HEMOSTAT SURGICEL 4X8 (HEMOSTASIS) ×3 IMPLANT
HOLDER FOLEY CATH W/STRAP (MISCELLANEOUS) ×2 IMPLANT
IRRIG SUCT STRYKERFLOW 2 WTIP (MISCELLANEOUS) ×2
IRRIGATION SUCT STRKRFLW 2 WTP (MISCELLANEOUS) ×1 IMPLANT
KIT BASIN OR (CUSTOM PROCEDURE TRAY) ×2 IMPLANT
KIT TURNOVER KIT A (KITS) IMPLANT
LOOP VESSEL MAXI BLUE (MISCELLANEOUS) ×2 IMPLANT
MARKER SKIN DUAL TIP RULER LAB (MISCELLANEOUS) ×2 IMPLANT
NDL INSUFFLATION 14GA 120MM (NEEDLE) ×1 IMPLANT
NEEDLE INSUFFLATION 14GA 120MM (NEEDLE) ×2 IMPLANT
PORT ACCESS TROCAR AIRSEAL 12 (TROCAR) ×1 IMPLANT
PORT ACCESS TROCAR AIRSEAL 5M (TROCAR) ×1
PROTECTOR NERVE ULNAR (MISCELLANEOUS) ×4 IMPLANT
RELOAD STAPLE 45 2.6 WHT THIN (STAPLE) IMPLANT
SEAL CANN UNIV 5-8 DVNC XI (MISCELLANEOUS) ×4 IMPLANT
SEAL XI 5MM-8MM UNIVERSAL (MISCELLANEOUS) ×8
SET TRI-LUMEN FLTR TB AIRSEAL (TUBING) ×2 IMPLANT
SOLUTION ELECTROLUBE (MISCELLANEOUS) ×2 IMPLANT
SPIKE FLUID TRANSFER (MISCELLANEOUS) ×2 IMPLANT
SPONGE T-LAP 4X18 ~~LOC~~+RFID (SPONGE) ×2 IMPLANT
STAPLE RELOAD 45 WHT (STAPLE) IMPLANT
STAPLE RELOAD 45MM WHITE (STAPLE)
SURGIFLO W/THROMBIN 8M KIT (HEMOSTASIS) ×2 IMPLANT
SUT ETHILON 3 0 PS 1 (SUTURE) ×2 IMPLANT
SUT MNCRL AB 4-0 PS2 18 (SUTURE) ×4 IMPLANT
SUT PDS AB 1 CT1 27 (SUTURE) ×4 IMPLANT
SUT V-LOC BARB 180 2/0GR6 GS22 (SUTURE) ×2
SUT VIC AB 0 CT1 27 (SUTURE) ×8
SUT VIC AB 0 CT1 27XBRD ANTBC (SUTURE) ×4 IMPLANT
SUT VIC AB 2-0 SH 27 (SUTURE) ×4
SUT VIC AB 2-0 SH 27X BRD (SUTURE) ×2 IMPLANT
SUT VLOC BARB 180 ABS3/0GR12 (SUTURE) ×2
SUTURE V-LC BRB 180 2/0GR6GS22 (SUTURE) IMPLANT
SUTURE VLOC BRB 180 ABS3/0GR12 (SUTURE) ×1 IMPLANT
SYS BAG RETRIEVAL 10MM (BASKET) ×1
SYSTEM BAG RETRIEVAL 10MM (BASKET) ×1 IMPLANT
TOWEL OR 17X26 10 PK STRL BLUE (TOWEL DISPOSABLE) ×2 IMPLANT
TOWEL OR NON WOVEN STRL DISP B (DISPOSABLE) ×2 IMPLANT
TRAY FOLEY MTR SLVR 16FR STAT (SET/KITS/TRAYS/PACK) ×2 IMPLANT
TRAY LAPAROSCOPIC (CUSTOM PROCEDURE TRAY) ×2 IMPLANT
TROCAR KII 12X100 BLADELESS (ENDOMECHANICALS) ×2 IMPLANT
TROCAR Z-THREAD OPTICAL 5X100M (TROCAR) IMPLANT
WATER STERILE IRR 1000ML POUR (IV SOLUTION) ×4 IMPLANT

## 2021-06-07 NOTE — Transfer of Care (Signed)
Immediate Anesthesia Transfer of Care Note  Patient: [name]  Procedure(s) Performed: XI ROBOTIC ASSITED PARTIAL NEPHRECTOMY (Left: Abdomen)  Patient Location: PACU  Anesthesia Type:General  Level of Consciousness: awake, alert  and oriented  Airway & Oxygen Therapy: Patient Spontanous Breathing and Patient connected to face mask oxygen  Post-op Assessment: Report given to RN, Post -op Vital signs reviewed and stable and Patient moving all extremities X 4  Post vital signs: Reviewed and stable  Last Vitals:  Vitals Value Taken Time  BP 132/118 06/07/21 1621  Temp    Pulse 77 06/07/21 1623  Resp 13 06/07/21 1623  SpO2 100 % 06/07/21 1623  Vitals shown include unvalidated device data.  Last Pain:  Vitals:   06/07/21 1043  TempSrc:   PainSc: 0-No pain         Complications: No notable events documented.

## 2021-06-07 NOTE — Anesthesia Procedure Notes (Signed)
Procedure Name: Intubation Date/Time: 06/07/2021 1:22 PM Performed by: Niel Hummer, CRNA Pre-anesthesia Checklist: Patient identified, Emergency Drugs available, Suction available and Patient being monitored Patient Re-evaluated:Patient Re-evaluated prior to induction Oxygen Delivery Method: Circle system utilized Preoxygenation: Pre-oxygenation with 100% oxygen Induction Type: IV induction, Rapid sequence and Cricoid Pressure applied Laryngoscope Size: Mac and 4 Grade View: Grade I Tube type: Oral Tube size: 7.5 mm Number of attempts: 1 Airway Equipment and Method: Stylet Placement Confirmation: ETT inserted through vocal cords under direct vision, positive ETCO2 and breath sounds checked- equal and bilateral Secured at: 23 cm Tube secured with: Tape Dental Injury: Teeth and Oropharynx as per pre-operative assessment

## 2021-06-07 NOTE — Plan of Care (Signed)

## 2021-06-07 NOTE — H&P (Signed)
[name] is an 66 y.o. male.    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 Rt sided <2cm cyst as well. Cr 0.98. No chest lesions.   Post-op Surveillance:  09/2019 CXR, CT, CMP - NEW LEFT 1.3cm mid lateral cortical mass, mild enhancement, Cr 1.1 1 artery / 1 vein (lumbar below artery) renovascular anatomy  05/2020 CT, CMP - Stable left mass, Cr 1.0  05/2021 CT, CMP - Left renal mass now 2.5cm, Cr 1.3/ 1 artery / 1 vein (lumbar inferior to artery) renovasculat anatomy. Lt Mass 70% exophytic, mid lateral.   2 - Lower Urinary Tract Symptoms / Enlarged Prostate / Hypocontractile Bladder - followed with Tannenbaum for years. Prior TURP years ago. UDS previously hypocontractile with PDet 40 at Qm 9. PVR's 100-300 range x many. No manages with finasteride daily. Adding Tamsulosin 2022. Prostate vol 90m (normal for age) by CT 2022. Adding nightly oxybutynin 01/2021.   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 NO h/o DM or severe HTN. Cr now 1.7s or less.   PMH sig for MI/CVA 2010 (follows PDorris CarnesCards yearly, not limiting whatsoever), Bacterial Meningitis 2017 (no deficits, gram positives), No prior chest / abd surgeries. No strong blood thinners. His PCP is NMarcellus ScottMD.   Today " MHymen is seen to proceed with LEFT partial nephrectomy for enlarging mass in solitary kidney. Hgb `15, Cr 1.4.    Past Medical History:  Diagnosis Date   Bacteremia 110/2017   Cancer ([facility]    renal mass at preop   CVA (cerebral infarction)    hemmorhagic  CVA   Depression    Dyslipidemia    Endocarditis    MV Vegitation   History of kidney stones    Hypertension    Mitral regurgitation    Myocardial infarction ([facility]    Stroke ([facility]     Past Surgical History:  Procedure Laterality Date   CARDIAC  CATHETERIZATION     CARDIOVERSION N/A 09/14/2020   Procedure: CARDIOVERSION;  Surgeon: AElouise Munroe MD;  [place]  Service: Cardiovascular;  Laterality: N/A;   MITRAL VALVE REPAIR     NO PAST SURGERIES     RIGHT/LEFT HEART CATH AND CORONARY ANGIOGRAPHY N/A 05/11/2020   Procedure: RIGHT/LEFT HEART CATH AND CORONARY ANGIOGRAPHY;  Surgeon: CSherren Mocha MD;  [place];  Service: Cardiovascular;  Laterality: N/A;   ROBOT ASSISTED LAPAROSCOPIC NEPHRECTOMY Right 02/22/2016   Procedure: XI ROBOTIC ASSISTED LAPAROSCOPIC NEPHRECTOMY;  Surgeon: TAlexis Frock MD;  [facility];  Service: Urology;  Laterality: Right;   TEE WITHOUT CARDIOVERSION N/A 01/28/2020   Procedure: TRANSESOPHAGEAL ECHOCARDIOGRAM (TEE);  Surgeon: RFay Records MD;  [facility];  Service: Cardiovascular;  Laterality: N/A;   TONSILLECTOMY      Family History  Problem Relation Age of Onset   Heart disease Mother        mom RF as a child (died of heart problem in  473)   Social History:  reports that he has never smoked. He has never used smokeless tobacco. He reports current alcohol use of about 4.0 - 5.0 standard drinks per week. He reports current drug use. Drug: Marijuana.  Allergies: No Known Allergies  No medications prior to admission.    No results found for this  or any previous visit (from the past 48 hour(s)). No results found.  Review of Systems  Constitutional: Negative.  Negative for chills and fever.  All other systems reviewed and are negative.  There were no vitals taken for this visit. Physical Exam Vitals reviewed.  HENT:     Head: Normocephalic.  Eyes:     Pupils: Pupils are equal, round, and reactive to light.  Cardiovascular:     Rate and Rhythm: Normal rate.  Abdominal:     General: Abdomen is flat.     Comments: Prior scars w/o hernias.   Genitourinary:    Comments: No CVAT at present.  Musculoskeletal:        General: Normal  range of motion.     Cervical back: Normal range of motion.  Skin:    General: Skin is warm.  Neurological:     General: No focal deficit present.     Mental Status: He is alert.     Assessment/Plan  Proceed as planned with LEFT partial nephrectomy. Risks (including conversion to radical neprhectomy/need for dialysis), benefits, alternatives, expected peri-op course discussed previously and reiterated today. He has very good understanding of competing oncologic v. Renal functional risks.   Alexis Frock, MD 06/07/2021, 6:29 AM

## 2021-06-07 NOTE — Discharge Instructions (Addendum)

## 2021-06-07 NOTE — Brief Op Note (Signed)
06/07/2021  4:49 PM  PATIENT:  [name]  66 y.o. 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  PROCEDURE:  Procedure(s) with comments: XI ROBOTIC ASSITED PARTIAL NEPHRECTOMY (Left) - 3 HRS  SURGEON:  Surgeon(s) and Role:    Alexis Frock, MD - Primary  PHYSICIAN ASSISTANT:   ASSISTANTS: Debbrah Alar PA   ANESTHESIA:   local and general  EBL:  100 mL   BLOOD ADMINISTERED:none  DRAINS:  1 - Foley to gravity; 2 - JP to bulb    LOCAL MEDICATIONS USED:  MARCAINE     SPECIMEN:  Source of Specimen:  Left renal mass  DISPOSITION OF SPECIMEN:  PATHOLOGY  COUNTS:  YES  TOURNIQUET:  * No tourniquets in log *  DICTATION: .Other Dictation: Dictation Number [id number]  PLAN OF CARE: Admit for overnight observation  PATIENT DISPOSITION:  PACU - hemodynamically stable.   Delay start of Pharmacological VTE agent (>24hrs) due to surgical blood loss or risk of bleeding: yes

## 2021-06-07 NOTE — Op Note (Unsigned)
[name], [name] MEDICAL RECORD NO: [id number] ACCOUNT NO: [id number] DATE OF BIRTH: September 15, 1955 FACILITY: [facility] LOCATION: [facility] PHYSICIAN: Alexis Frock, MD  Operative Report   DATE OF PROCEDURE: 06/07/2021  PREOPERATIVE DIAGNOSIS:  Left renal mass enlarging and solitary kidney.  PROCEDURES:  Left robotic partial nephrectomy.  ESTIMATED BLOOD LOSS:  100 mL.  COMPLICATIONS:  None.  SPECIMEN:  Left renal mass for permanent pathology.  FINDINGS: 1.  Single artery, single vein, left renal vascular anatomy, large lumbar vein as anticipated. 2.  Approximately 50% exophytic left solid renal mass.  ASSISTANT:  Debbrah Alar, PA  DRAINS:  1.  Jackson-Pratt drain to bulb suction. 2.  Foley catheter to straight drain.  INDICATIONS:  [name] is a very pleasant 66 year old man with a complex urologic history including multifocal renal cell carcinoma.  He is status post right nephrectomy years ago and has done well clinically.  However, on surveillance, he was noted to have  a new solid enhancing left renal mass.  This met criteria for surveillance for a number of years; however, this has been slowly enlarging and this year, it became greater than 2.5 cm. In accordance with the NIH hereditary or multifocal renal mass  protocol, it is felt that the most prudent means of management would be to proceed with left partial nephrectomy with curative intent from this lesion and he wished to proceed.  Informed consent was obtained and placed in medical record.  PROCEDURE DETAILS:  The patient being [name] verified, procedure being left robotic partial nephrectomy was confirmed.  Procedure timeout was performed.  Intravenous antibiotics were administered.  General endotracheal anesthesia induced.  The  patient was placed in the left side up full flank position, pulling 15 degrees of table flexion, superior arm elevator, axillary roll sequential compression devices, bottom leg bent, top leg straight.   Beanbag was deployed.  He was further fastened to  operating table using 3-inch tape over foam padding across the supraxiphoid chest and his pelvis.  Foley catheter had been placed free to straight drain.  Sterile field was created, prepping and draping the patient's entire left flank and abdomen using  chlorhexidine gluconate.  Next a high flow, low pressure, pneumoperitoneum was obtained using Veress technique in the left lower quadrant, having passed the aspiration and drop test.  An 8 mm robotic camera port was then placed in position approximately  1-1/4 handbreadths superolateral to the umbilicus.  Laparoscopic examination of the peritoneal cavity revealed amazingly minimal adhesions and no visceral injury.  Notably, there were no adhesions in his midline where he had had a prior kidney  extraction.  This was quite favorable.  Additional ports were placed as follows:  Left subcostal 8 mm robotic port, left far lateral 8 mm robotic port, approximately 4 fingerbreadths superior and medial to the anterior iliac spine, left paramedian  inferior robotic port, approximately 1 handbreadth superior to the pubic ramus, two 12 mm assistant port sites to midline, one approximately 2 fingerbreadths inferior to the planned camera port.  This was purposely into his prior midline extraction site  as there were no obvious adhesions in this area and another 12 mm assistant port site in a plane, approximately 2 fingerbreadths superior to the planned camera port.  Robot was docked, having passed the electronic checks.  Attention was directed at  developing the retroperitoneum.  Incision was made lateral to the descending colon from the area of the splenic flexure towards the area of the internal ring and the colon was  carefully swept medially.  Lateral splenic attachments were taken down,  allowing the spleen to rotate medially.  There was a small splenule noted as well in a fairly medial location.  This also was  allowed to rotate medially as well and also the plane between the inferior aspect of the pancreas and the anterior Gerota's  fascia was carefully developed to allow the pancreas to fall medially and self retract with the spleen and splenic vessels. Lower pole of the kidney area was identified, placed on gentle lateral traction.  Dissection proceeded medial to this. The ureter  and gonadal vessels were encountered and carefully swept laterally and dissection proceeded within the triangle of these structures and the psoas musculature towards the area of the renal hilum.  Renal hilum consisted of a single artery, single vein,  left renal vascular anatomy as anticipated. There was a significant lumbar vein, inferior to the artery.  Multiple angulations towards the arterial control were considered including superior to the vein.  However, the safest one that had appeared was the  inferior approach.  However, the lumbar was in the way of this.  Therefore, the lumbar vein was controlled using quadruple Hem-o-lok clip, two up and two down with transection of the middle, which then allowed an excellent window to the left renal  artery stump and this was circumferentially mobilized and marked with vessel loop approximately 5 mm distal to the aorta.  Attention was directed to identification of the mass and dissection proceeded directly onto the anterior aspect of the renal  parenchyma at approximate level of the hilum and then proceeded laterally. The mass in question was encountered.  There was some desmoplastic reaction around this as anticipated and a purposeful wide dissection around the peri mass fat was performed in  order to keep a fat pad of perinephric fat around the neoplasm.  The area of the interface between the mass and normal-appearing parenchyma was amenable to visual cues only and this was circumferentially scored using monopolar cautery for the plane of  the presumed partial nephrectomy.  Next warm  ischemia was achieved with 2 bulldog clamps on the artery and very careful partial nephrectomy was performed using cold scissors, keeping what appeared to be a rim of nontumor parenchyma with the partial  nephrectomy specimen, this was then set aside.  First layer renorrhaphy was performed using running 3-0 V-Loc oversewing several small venous sinuses.  There were no obvious ***.  Bolster was applied and secondary renorrhaphy was performed using 0 Vicryl  sandwiched between Hem-o-loks and Lapra-Tys, thus providing parenchymal compression, which was excellent.  This was performed x 3.  Bulldog clamps were then removed for a total warm ischemia time of 12 minutes.  Also, quite favorable and hemostasis of  the area of renorrhaphy was excellent.  The kidney was laid back into anatomic position and the Gerota's fascia was reapproximated using running 2-0 V-Loc to allow for any tamponade.  He has a small venous oozing present.  The vessel loops on the artery  was removed and the entire operative field was inspected.  Sponge and needle counts were correct.  Hemostasis was quite good.  FloSeal was applied to the area of renorrhaphy and a small amount of area of the hilum.  A closed suction drain was brought  through the previous lateral most robotic port site into the peritoneal cavity.  Robot was then undocked.  Specimen was retrieved.  Then, the superior most 12 mm assistant port site was closed with  fascia using Carter-Thompson suture passer and 0 Vicryl  and the inferior 12 mm site was purposely extended for a total distance of approximately 3 cm directly through his prior extraction site and the partial nephrectomy specimen was removed through this location and set aside for permanent pathology.  This  new extraction site was then closed with fascia using figure-of-eight PDS x 3 followed by reapproximation of Scarpa's with running Vicryl.  All incision sites were infiltrated with dilute lipolyzed Marcaine and  closed at the level of the skin using  subcuticular Monocryl followed by Dermabond.  Procedure was then terminated.  The patient tolerated the procedure well, no immediate perioperative complications.  The patient was taken to postanesthesia care unit in stable condition.  Plan for observation admission.  Please note, first assistant, Debbrah Alar, was crucial for all portions of surgery.  She provided invaluable retraction, suctioning, vascular clamping, specimen manipulation, and general first assistance.  Notably, the specimen was placed in an  EndoCatch bag after renorrhaphy.   MUK D: 06/07/2021 4:59:03 pm T: 06/07/2021 10:42:00 pm  JOB: [id number]/ [id number]

## 2021-06-08 ENCOUNTER — Encounter (HOSPITAL_COMMUNITY): Payer: Self-pay | Admitting: Urology

## 2021-06-08 DIAGNOSIS — C642 Malignant neoplasm of left kidney, except renal pelvis: Secondary | ICD-10-CM | POA: Diagnosis not present

## 2021-06-08 DIAGNOSIS — N2889 Other specified disorders of kidney and ureter: Secondary | ICD-10-CM | POA: Diagnosis not present

## 2021-06-08 DIAGNOSIS — I10 Essential (primary) hypertension: Secondary | ICD-10-CM | POA: Diagnosis not present

## 2021-06-08 DIAGNOSIS — I252 Old myocardial infarction: Secondary | ICD-10-CM | POA: Diagnosis not present

## 2021-06-08 DIAGNOSIS — Z8673 Personal history of transient ischemic attack (TIA), and cerebral infarction without residual deficits: Secondary | ICD-10-CM | POA: Diagnosis not present

## 2021-06-08 LAB — BASIC METABOLIC PANEL
Anion gap: 10 (ref 5–15)
BUN: 25 mg/dL — ABNORMAL HIGH (ref 8–23)
CO2: 20 mmol/L — ABNORMAL LOW (ref 22–32)
Calcium: 8.9 mg/dL (ref 8.9–10.3)
Chloride: 108 mmol/L (ref 98–111)
Creatinine, Ser: 1.92 mg/dL — ABNORMAL HIGH (ref 0.61–1.24)
GFR, Estimated: 38 mL/min — ABNORMAL LOW (ref >60)
Glucose, Bld: 114 mg/dL — ABNORMAL HIGH (ref 70–99)
Potassium: 4.4 mmol/L (ref 3.5–5.1)
Sodium: 138 mmol/L (ref 135–145)

## 2021-06-08 LAB — HEMOGLOBIN AND HEMATOCRIT, BLOOD
HCT: 43.7 % (ref 39.0–52.0)
Hemoglobin: 14.4 g/dL (ref 13.0–17.0)

## 2021-06-08 MED ORDER — POLYVINYL ALCOHOL 1.4 % OP SOLN
1.0000 [drp] | OPHTHALMIC | Status: DC | PRN
  Filled 2021-06-08: qty 15

## 2021-06-08 MED ORDER — CHLORHEXIDINE GLUCONATE CLOTH 2 % EX PADS
6.0000 | MEDICATED_PAD | Freq: Every day | CUTANEOUS | Status: DC
  Administered 2021-06-09: 6 via TOPICAL

## 2021-06-08 MED ORDER — METOCLOPRAMIDE HCL 5 MG/ML IJ SOLN
10.0000 mg | Freq: Three times a day (TID) | INTRAMUSCULAR | Status: DC
  Administered 2021-06-08 – 2021-06-09 (×4): 10 mg via INTRAVENOUS
  Filled 2021-06-08 (×5): qty 2

## 2021-06-08 NOTE — Anesthesia Postprocedure Evaluation (Signed)
Anesthesia Post Note  Patient: [name]  Procedure(s) Performed: XI ROBOTIC ASSITED PARTIAL NEPHRECTOMY (Left: Abdomen)     Patient location during evaluation: PACU Anesthesia Type: General Level of consciousness: awake and alert Pain management: pain level controlled Vital Signs Assessment: post-procedure vital signs reviewed and stable Respiratory status: spontaneous breathing, nonlabored ventilation, respiratory function stable and patient connected to nasal cannula oxygen Cardiovascular status: blood pressure returned to baseline and stable Postop Assessment: no apparent nausea or vomiting Anesthetic complications: no   No notable events documented.  Last Vitals:  Vitals:   06/08/21 0327 06/08/21 0607  BP: 123/81 132/88  Pulse: (!) 110 (!) 107  Resp: 18 18  Temp: 36.8 C 36.6 C  SpO2:  95%    Last Pain:  Vitals:   06/08/21 3382  TempSrc: Oral  PainSc:                  Tiajuana Amass

## 2021-06-08 NOTE — Progress Notes (Cosign Needed)
1 Day post op from robotic left partial nephrectomy in solitary kidney   Subjective: Complaining of mid abdominal pain One episode of emesis, does not feel nauseous, is hungry Has not ambulated yet Not passing flatus   Objective: Vital signs in last 24 hours: Temp:  [96.9 F (36.1 C)-98.3 F (36.8 C)] 97.8 F (36.6 C) (06/08 0607) Pulse Rate:  [62-113] 107 (06/08 0607) Resp:  [11-20] 18 (06/08 0607) BP: (113-136)/(62-95) 132/88 (06/08 0607) SpO2:  [95 %-100 %] 95 % (06/08 0607) Weight:  [87.2 kg] 87.2 kg (06/08 0607)  Intake/Output from previous day: 06/07 0701 - 06/08 0700 In: 3315.3 [I.V.:3215.3; IV Piggyback:100] Out: 935 [Urine:775; Drains:60; Blood:100] Intake/Output this shift: Total I/O In: 240 [P.O.:240] Out: -   Physical Exam:  General: Alert and oriented CV: Regular rate, mild tachycardia  Lungs: Clear Abdomen: Soft, ND, nontender. JP drain with serosanguinous output  Incisions:clean, dry, intact  Ext: NT, No erythema  Lab Results: Recent Labs    06/07/21 1651 06/08/21 0516  HGB 15.4 14.4  HCT 48.2 43.7   BMET Recent Labs    06/08/21 0516  NA 138  K 4.4  CL 108  CO2 20*  GLUCOSE 114*  BUN 25*  CREATININE 1.92*  CALCIUM 8.9     Studies/Results: No results found.  Assessment/Plan: 66 y/o male s/p left robotic partial nephrectomy in left solitary kidney on 06/08/21.  Recovering appropriately, expected mild creatinine elevation.  -Advance diet, stop fluids -Start Reglan '10mg'$  IV q8hours to improve GI motility  -Remove foley catheter and trial of void -Will remove JP drain prior to discharge  -Off bedrest, ambulate today -Will likely remain admitted for repeat Cr POD2    LOS: 0 days   Aashish Hamm 06/08/2021, 11:04 AM

## 2021-06-09 ENCOUNTER — Other Ambulatory Visit: Payer: Self-pay

## 2021-06-09 DIAGNOSIS — N2889 Other specified disorders of kidney and ureter: Secondary | ICD-10-CM | POA: Diagnosis not present

## 2021-06-09 DIAGNOSIS — I10 Essential (primary) hypertension: Secondary | ICD-10-CM | POA: Diagnosis not present

## 2021-06-09 DIAGNOSIS — Z8673 Personal history of transient ischemic attack (TIA), and cerebral infarction without residual deficits: Secondary | ICD-10-CM | POA: Diagnosis not present

## 2021-06-09 DIAGNOSIS — I252 Old myocardial infarction: Secondary | ICD-10-CM | POA: Diagnosis not present

## 2021-06-09 DIAGNOSIS — C642 Malignant neoplasm of left kidney, except renal pelvis: Secondary | ICD-10-CM | POA: Diagnosis not present

## 2021-06-09 LAB — CBC
HCT: 42.2 % (ref 39.0–52.0)
Hemoglobin: 13.7 g/dL (ref 13.0–17.0)
MCH: 29.4 pg (ref 26.0–34.0)
MCHC: 32.5 g/dL (ref 30.0–36.0)
MCV: 90.6 fL (ref 80.0–100.0)
Platelets: 140 10*3/uL — ABNORMAL LOW (ref 150–400)
RBC: 4.66 MIL/uL (ref 4.22–5.81)
RDW: 14.1 % (ref 11.5–15.5)
WBC: 20.6 10*3/uL — ABNORMAL HIGH (ref 4.0–10.5)
nRBC: 0 % (ref 0.0–0.2)

## 2021-06-09 LAB — BASIC METABOLIC PANEL
Anion gap: 8 (ref 5–15)
BUN: 25 mg/dL — ABNORMAL HIGH (ref 8–23)
CO2: 21 mmol/L — ABNORMAL LOW (ref 22–32)
Calcium: 8.9 mg/dL (ref 8.9–10.3)
Chloride: 111 mmol/L (ref 98–111)
Creatinine, Ser: 1.94 mg/dL — ABNORMAL HIGH (ref 0.61–1.24)
GFR, Estimated: 38 mL/min — ABNORMAL LOW (ref >60)
Glucose, Bld: 128 mg/dL — ABNORMAL HIGH (ref 70–99)
Potassium: 4.1 mmol/L (ref 3.5–5.1)
Sodium: 140 mmol/L (ref 135–145)

## 2021-06-09 LAB — SURGICAL PATHOLOGY

## 2021-06-09 MED ORDER — SODIUM CHLORIDE 0.9 % IV SOLN
INTRAVENOUS | Status: DC
Start: 2021-06-09 — End: 2021-06-09

## 2021-06-09 MED ORDER — SODIUM CHLORIDE 0.9 % IV BOLUS
500.0000 mL | Freq: Once | INTRAVENOUS | Status: AC
  Administered 2021-06-09: 500 mL via INTRAVENOUS

## 2021-06-09 MED ORDER — METOPROLOL TARTRATE 25 MG PO TABS
12.5000 mg | ORAL_TABLET | Freq: Two times a day (BID) | ORAL | Status: DC
  Administered 2021-06-09: 12.5 mg via ORAL
  Filled 2021-06-09: qty 1

## 2021-06-09 NOTE — Progress Notes (Signed)
RN notified Urologist Edythe Lynn of pt elevated HR in 120-130's and increased BP systolic in the 633'H. Was told to just monitor and then received orders to perform ekg and placed on telemetry. Was told not to page on call provider unless there were acute changes . Ekg showed sinus Tach with abnormal ekg BBB and PVC's. RN informed dayshift nurse and cardiology is to be consulted. Pt is asymptomatic with no chest pain or SOB.

## 2021-06-09 NOTE — Discharge Summary (Cosign Needed)
Date of admission: 06/07/2021  Date of discharge: 06/09/2021  Admission diagnosis: Left renal mass   Discharge diagnosis: Left renal mass   Secondary diagnoses:  Patient Active Problem List   Diagnosis Date Noted   Renal mass 06/07/2021   Persistent atrial fibrillation ([place])    Right renal mass 02/22/2016   Secondary hypertension    Debility    History of CVA with residual deficit    History of endocarditis    Malignant neoplasm of renal pelvis (HCC)    ETOH abuse    Hyponatremia    Dysphagia    Respiratory failure (HCC)    Elevated troponin    MVP (mitral valve prolapse)    Mitral valve insufficiency    Sepsis ([place]) 10/29/2015   Dyslipidemia 10/26/2010   Mitral valve disorders(424.0) 08/25/2008   DVT 08/25/2008   SEIZURE DISORDER 08/25/2008   CHEST PAIN 08/25/2008   DYSPHAGIA UNSPECIFIED 08/25/2008   MYOCARDIAL INFARCTION, ANTERIOR WALL, INITIAL EPISODE 06/18/2008   CEREBRAL EMBOLISM WITH CEREBRAL INFARCTION 06/18/2008   Other and unspecified hyperlipidemia 06/11/2008   Essential hypertension 06/11/2008   ENDOCARDITIS, BACTERIAL, ACUTE 26/83/4196   FOLLICULITIS 22/29/7989   NEPHROLITHIASIS, HX OF 06/11/2008    Procedures performed: Procedure(s): XI ROBOTIC ASSITED PARTIAL NEPHRECTOMY  History and Physical: For full details, please see admission history and physical. Briefly, [name] is a 66 y.o. 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  He is s/p right nephrectomy years ago and has done well clinically.  However, on surveillance, he was noted to have a new solid enhancing left renal mass. He presented for robotic left partial nephrectomy in solitary left kidney.  Hospital Course: Patient tolerated the procedure well.  He was then transferred to the floor after an uneventful PACU stay.  He did well post operatively. His pain was well controlled throughout his stay. He was able to ambulate without difficulty on POD1.  His diet was slowly advanced and he was able to tolerate a regular diet without nausea and vomiting. His creatinine on POD1 labs was 1.92 from baseline 1.4. He remained admitted to trend creatinine. Overnight POD1 he developed sustained tachycardia to the 120s. EKG showed sinus tach. He was symptomatic throughout. On POD2 he was given a 500cc bolus, restarted on mIVF and given 12.54m metoprolol. His heart rate improved to the 90s and he remained asymptomatic and afebrile. Repeat Cr was 1.94 on POD2.  His JP drain had minimal output and this was removed prior to discharge. On POD#2 he had met discharge criteria: was eating a regular diet, was up and ambulating independently,  pain was well controlled, was voiding without a catheter, and was ready to for discharge.   Laboratory values:  Recent Labs    06/07/21 1651 06/08/21 0516 06/09/21 0500  WBC  --   --  20.6*  HGB 15.4 14.4 13.7  HCT 48.2 43.7 42.2   Recent Labs    06/08/21 0516 06/09/21 0500  NA 138 140  K 4.4 4.1  CL 108 111  CO2 20* 21*  GLUCOSE 114* 128*  BUN 25* 25*  CREATININE 1.92* 1.94*  CALCIUM 8.9 8.9   No results for input(s): "LABPT", "INR" in the last 72 hours. No results for input(s): "LABURIN" in the last 72 hours. Results for orders placed or performed during the hospital encounter of 05/09/20  SARS CORONAVIRUS 2 (TAT 6-24 HRS) Nasopharyngeal Nasopharyngeal Swab     Status: None   Collection Time: 05/09/20 11:54 AM  Specimen: Nasopharyngeal Swab  Result Value Ref Range Status   SARS Coronavirus 2 NEGATIVE NEGATIVE Final    Comment: (NOTE) SARS-CoV-2 target nucleic acids are NOT DETECTED.  The SARS-CoV-2 RNA is generally detectable in upper and lower respiratory specimens during the acute phase of infection. Negative results do not preclude SARS-CoV-2 infection, do not rule out co-infections with other pathogens, and should not be used as the sole basis for treatment or other patient management  decisions. Negative results must be combined with clinical observations, patient history, and epidemiological information. The expected result is Negative.  Fact Sheet for Patients: SugarRoll.be  Fact Sheet for Healthcare Providers: https://www.woods-mathews.com/  This test is not yet approved or cleared by the [place] FDA and  has been authorized for detection and/or diagnosis of SARS-CoV-2 by FDA under an Emergency Use Authorization (EUA). This EUA will remain  in effect (meaning this test can be used) for the duration of the COVID-19 declaration under Se ction 564(b)(1) of the Act, 21 U.S.C. section 360bbb-3(b)(1), unless the authorization is terminated or revoked sooner.  Performed at [facility], [place] [address], [place], [place] [address]     Disposition: Home  Discharge instruction: The patient was instructed to be ambulatory but told to refrain from heavy lifting, strenuous activity, or driving.   Discharge medications:  Allergies as of 06/09/2021   No Known Allergies      Medication List     STOP taking these medications    aspirin 81 MG chewable tablet   cholecalciferol 25 MCG (1000 UNIT) tablet Commonly known as: VITAMIN D3   Melatonin 10 MG Tabs   PROBIOTIC DAILY PO       TAKE these medications    amoxicillin 500 MG capsule Commonly known as: AMOXIL Take 4 capsules (2,000 mg total) by mouth as directed. 4 pills one hour before dental appointment   buPROPion 150 MG 24 hr tablet Commonly known as: WELLBUTRIN XL Take 150 mg by mouth daily.   cetirizine 10 MG tablet Commonly known as: ZYRTEC Take 10 mg by mouth daily as needed for allergies.   diphenhydramine-acetaminophen 25-500 MG Tabs tablet Commonly known as: TYLENOL PM Take 2 tablets by mouth at bedtime.   docusate sodium 100 MG capsule Commonly known as: COLACE Take 1 capsule (100 mg total) by mouth 2 (two) times daily.    finasteride 5 MG tablet Commonly known as: PROSCAR Take 1 tablet (5 mg total) by mouth daily.   HYDROcodone-acetaminophen 5-325 MG tablet Commonly known as: Norco Take 1-2 tablets by mouth every 6 (six) hours as needed for moderate pain or severe pain.   losartan 50 MG tablet Commonly known as: COZAAR Take 50 mg by mouth daily.   oxybutynin 5 MG tablet Commonly known as: DITROPAN Take 5 mg by mouth at bedtime.   oxymetazoline 0.05 % nasal spray Commonly known as: AFRIN Place 1 spray into both nostrils 2 (two) times daily as needed for congestion.   rosuvastatin 10 MG tablet Commonly known as: CRESTOR Take 1 tablet (10 mg total) by mouth daily.   tamsulosin 0.4 MG Caps capsule Commonly known as: FLOMAX Take 0.4 mg by mouth daily.        Followup:   Follow-up Information     Alexis Frock, MD Follow up on 06/26/2021.   Specialty: Urology Why: at 11:30 for MD visit and pathology review. Contact information: [place] [address] [phone]

## 2021-06-09 NOTE — Progress Notes (Signed)
2 Days post op from robotic left partial nephrectomy in solitary kidney   Subjective: Patient remains asymptomatic despite sustained HR in 110s-120s  Tolerated regular diet, passing flatus. No additional nausea or emesis  Unable to sleep last night given work up for tachycardia  Feels at his baseline and would like to go home   Objective: Vital signs in last 24 hours: Temp:  [97.9 F (36.6 C)-99.4 F (37.4 C)] 97.9 F (36.6 C) (06/09 0400) Pulse Rate:  [104-124] 111 (06/09 0626) Resp:  [18-23] 23 (06/09 0626) BP: (115-164)/(67-106) 151/93 (06/09 0626) SpO2:  [95 %-98 %] 95 % (06/09 0626)  Intake/Output from previous day: 06/08 0701 - 06/09 0700 In: 240 [P.O.:240] Out: 645 [Urine:550; Drains:95] Intake/Output this shift: No intake/output data recorded.  Physical Exam:  General: Alert and oriented CV: Sinus tachycardia Lungs: Clear Abdomen: Soft, ND Incisions: clean, dry, intact. JP drain with serosanguinous output  Ext: NT, No erythema  Lab Results: Recent Labs    06/07/21 1651 06/08/21 0516 06/09/21 0500  HGB 15.4 14.4 13.7  HCT 48.2 43.7 42.2   BMET Recent Labs    06/08/21 0516 06/09/21 0500  NA 138 140  K 4.4 4.1  CL 108 111  CO2 20* 21*  GLUCOSE 114* 128*  BUN 25* 25*  CREATININE 1.92* 1.94*  CALCIUM 8.9 8.9     Studies/Results: No results found.  Assessment/Plan: 66 y/o male s/p left robotic partial nephrectomy in left solitary kidney on 06/08/21. Creatinine has plateaued at 1.9. Currently undergoing trial of void, otherwise normalized from post surgical standpoint.   Sinus Tachycardia : HR sustained in 110s-120s, EKG showing sinus tachycardia.  -Plan to give 500cc bolus, restart mIVF, start metoprolol 12.'5mg'$  BID.  -Will continue to monitor through the morning      LOS: 0 days   Aldine Contes 06/09/2021, 7:19 AM

## 2021-06-09 NOTE — Progress Notes (Signed)
PHARMACIST - PHYSICIAN COMMUNICATION  DR:   [name] CONCERNING: IV to Oral Route Change Policy  RECOMMENDATION: This patient is receiving diphenhydramine by the intravenous route.  Based on criteria approved by the Pharmacy and Therapeutics Committee, intravenous diphenhydramine is being converted to the equivalent oral dose form(s).   DESCRIPTION: These criteria include: Diphenhydramine is not prescribed to treat or prevent a severe allergic reaction Diphenhydramine is not prescribed as premedication prior to receiving blood product, biologic medication, antimicrobial, or chemotherapy agent The patient has tolerated at least one dose of an oral or enteral medication The patient has no evidence of active gastrointestinal bleeding or impaired GI absorption (gastrectomy, short bowel, patient on TNA or NPO). The patient is not undergoing procedural sedation   If you have questions about this conversion, please contact the Pharmacy Department  '[]'$   [phone] )  [facility] '[]'$   [phone] )  [facility] '[]'$   [phone] )  [facility] '[]'$   [phone] )  [facility] '[x]'$   [phone] )  [place], PharmD, [place]: [phone] 06/09/2021 11:23 AM

## 2021-06-09 NOTE — Plan of Care (Signed)

## 2021-06-26 DIAGNOSIS — Z905 Acquired absence of kidney: Secondary | ICD-10-CM | POA: Diagnosis not present

## 2021-06-29 ENCOUNTER — Telehealth: Payer: Self-pay | Admitting: Genetic Counselor

## 2021-06-29 NOTE — Telephone Encounter (Signed)
Scheduled appt per 6/28 referral. Pt is aware of appt date and time. Pt is aware to arrive 15 mins prior to appt time and to bring and updated insurance card. Pt is aware of appt location.

## 2021-08-03 DIAGNOSIS — I129 Hypertensive chronic kidney disease with stage 1 through stage 4 chronic kidney disease, or unspecified chronic kidney disease: Secondary | ICD-10-CM | POA: Diagnosis not present

## 2021-08-03 DIAGNOSIS — D631 Anemia in chronic kidney disease: Secondary | ICD-10-CM | POA: Diagnosis not present

## 2021-08-03 DIAGNOSIS — Z905 Acquired absence of kidney: Secondary | ICD-10-CM | POA: Diagnosis not present

## 2021-08-03 DIAGNOSIS — N1831 Chronic kidney disease, stage 3a: Secondary | ICD-10-CM | POA: Diagnosis not present

## 2021-08-08 ENCOUNTER — Inpatient Hospital Stay: Payer: 59

## 2021-08-08 ENCOUNTER — Inpatient Hospital Stay: Payer: 59 | Attending: Genetic Counselor | Admitting: Genetic Counselor

## 2021-08-08 ENCOUNTER — Other Ambulatory Visit: Payer: Self-pay | Admitting: Genetic Counselor

## 2021-08-08 DIAGNOSIS — Z1379 Encounter for other screening for genetic and chromosomal anomalies: Secondary | ICD-10-CM

## 2021-08-08 DIAGNOSIS — C659 Malignant neoplasm of unspecified renal pelvis: Secondary | ICD-10-CM

## 2021-08-08 LAB — GENETIC SCREENING ORDER

## 2021-08-08 NOTE — Progress Notes (Signed)
REFERRING PROVIDER: Alexis Frock, MD [place],  [place] [address]  PRIMARY PROVIDER:  Josetta Huddle, MD  PRIMARY REASON FOR VISIT:  1. Malignant neoplasm of renal pelvis, unspecified laterality ([place])     HISTORY OF PRESENT ILLNESS:   Mr. Convey, a 66 y.o. male, was seen for a [facility] cancer genetics consultation at the request of Dr. [name] due to a personal history of cancer.  Mr. Knierim presents to clinic today to discuss the possibility of a hereditary predisposition to cancer, to discuss genetic testing, and to further clarify his future cancer risks, as well as potential cancer risks for family members.   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 In 2023, at the age of 79, Mr. Maclellan was diagnosed with left clear cell renal cell carcinoma.   Past Medical History:  Diagnosis Date   Bacteremia 110/2017   Cancer [facility])    renal mass at preop   CVA (cerebral infarction)    hemmorhagic  CVA   Depression    Dyslipidemia    Endocarditis    MV Veg[place]   History of kidney stones    Hypertension    Mitral regurgitation    Myocardial infarction [facility])    Stroke [facility])     Past Surgical History:  Procedure Laterality Date   CARDIAC CATHETERIZATION     CARDIOVERSION N/A 09/14/2020   Procedure: CARDIOVERSION;  Surgeon: Elouise Munroe, MD;  Location: [place];  Service: Cardiovascular;  Laterality: N/A;   MITRAL VALVE REPAIR     NO PAST SURGERIES     RIGHT/LEFT HEART CATH AND CORONARY ANGIOGRAPHY N/A 05/11/2020   Procedure: RIGHT/LEFT HEART CATH AND CORONARY ANGIOGRAPHY;  Surgeon: Sherren Mocha, MD;  Location: [place] CV LAB;  Service: Cardiovascular;  Laterality: N/A;   ROBOT ASSISTED LAPAROSCOPIC NEPHRECTOMY Right 02/22/2016   Procedure: XI ROBOTIC ASSISTED LAPAROSCOPIC NEPHRECTOMY;  Surgeon: Alexis Frock, MD;  Location: [facility];  Service: Urology;  Laterality: Right;    ROBOTIC ASSITED PARTIAL NEPHRECTOMY Left 06/07/2021   Procedure: XI ROBOTIC ASSITED PARTIAL NEPHRECTOMY;  Surgeon: Alexis Frock, MD;  Location: [facility];  Service: Urology;  Laterality: Left;  3 HRS   TEE WITHOUT CARDIOVERSION N/A 01/28/2020   Procedure: TRANSESOPHAGEAL ECHOCARDIOGRAM (TEE);  Surgeon: Fay Records, MD;  Location: [facility] ENDOSCOPY;  Service: Cardiovascular;  Laterality: N/A;   TONSILLECTOMY      Social History   Socioeconomic History   Marital status: Married    Spouse name: Not on file   Number of children: 3   Years of education: 51   Highest education level: Professional school degree (e.g., MD, DDS, DVM, JD)  Occupational History   Not on file  Tobacco Use   Smoking status: Never   Smokeless tobacco: Never  Vaping Use   Vaping Use: Never used  Substance and Sexual Activity   Alcohol use: Yes    Alcohol/week: 4.0 - 5.0 standard drinks of alcohol    Types: 4 - 5 Glasses of wine per week   Drug use: Yes    Types: Marijuana    Comment: Takes CBD gummies before bedtime   Sexual activity: Yes  Other Topics Concern   Not on file  Social History Narrative   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  He reports no family history of cancer or hereditary cancer genetic testing. He reports maternal and paternal Ashkenazi Jewish ancestry.  GENETIC COUNSELING ASSESSMENT: Mr. Ermis is a 66 y.o. male with a personal history of cancer which is somewhat suggestive of a hereditary predisposition to cancer given his personal history of multifocal renal cell carcinoma. We, therefore, discussed and recommended the following at today's visit.    DISCUSSION: We discussed that 5 - 10% of cancer is hereditary, with some cases of kidney cancer associated with genes such as FLCN, FH, SDHB, and SDHD.  There are other genes that can be associated with hereditary kidney cancer syndromes.  We discussed that testing is beneficial for several reasons including knowing how to follow individuals after completing their treatment and understanding if other family members could be at increased risk for cancer.   We reviewed the characteristics, features and inheritance patterns of hereditary cancer syndromes. We also discussed genetic testing, including the appropriate family members to test, the process of testing, insurance coverage and turn-around-time for results. We discussed the implications of a negative, positive, carrier and/or variant of uncertain significant result. We recommended Mr. Embleton pursue genetic testing for a panel that includes genes associated with renal cell carcinoma.   Mr. Trumbull  was offered a common hereditary cancer panel (47 genes) and an expanded pan-cancer panel (77 genes). Mr. Cavallero was informed of the benefits and limitations of each panel, including that expanded pan-cancer panels contain genes that do not have clear management guidelines at this point in time.  We also discussed that as the number of genes included on a panel increases, the chances of variants of uncertain significance increases. After considering the benefits and limitations of each gene panel, Mr. Cordoba elected to have [facility] CancerNext-Expanded Panel.  The CancerNext-Expanded gene panel offered by [facility] and includes sequencing, rearrangement, and RNA analysis for the following 77 genes: AIP, ALK, APC, ATM, AXIN2, BAP1, BARD1, BLM, BMPR1A, BRCA1, BRCA2, BRIP1, CDC73, CDH1, CDK4, CDKN1B, CDKN2A, CHEK2, CTNNA1, DICER1, FANCC, FH, FLCN, GALNT12, KIF1B, LZTR1, MAX, MEN1, MET, MLH1, MSH2, MSH3, MSH6, MUTYH, NBN, NF1, NF2, NTHL1, PALB2, PHOX2B,  PMS2, POT1, PRKAR1A, PTCH1, PTEN, RAD51C, RAD51D, RB1, RECQL, RET, SDHA, SDHAF2, SDHB, SDHC, SDHD, SMAD4, SMARCA4, SMARCB1, SMARCE1, STK11, SUFU, TMEM127, TP53, TSC1, TSC2, VHL and XRCC2 (sequencing and deletion/duplication); EGFR, EGLN1, HOXB13, KIT, MITF, PDGFRA, POLD1, and POLE (sequencing only); EPCAM and GREM1 (deletion/duplication only).   Based on Mr. Morrissey's personal history of cancer, he meets medical criteria for genetic testing. Despite that he meets criteria, he may still have an out of pocket cost. We discussed that if his out of pocket cost for testing is over $100, the laboratory will call and confirm whether he wants to proceed with testing.  If the out of pocket cost of testing is less than $100 he will be billed by the genetic testing laboratory.   PLAN: After considering the risks, benefits, and limitations, Mr. Morais provided informed consent to pursue genetic testing and the blood sample was sent to [facility] for analysis of the CancerNext-Expanded Panel. Results should be available within approximately 2-3 weeks' time, at which point they will be disclosed by telephone to Mr. Guimond, as will any additional recommendations warranted by these  results. Mr. Labell will receive a summary of his genetic counseling visit and a copy of his results once available. This information will also be available in Epic.   Mr. Vandergrift questions were answered to his satisfaction today. Our contact information was provided should additional questions or concerns arise. Thank you for the referral and allowing [place] to share in the care of your patient.   Lucille Passy, MS, [facility] Genetic Counselor [email or web address] (P) [phone]  The patient was seen for a total of 30 minutes in face-to-face genetic counseling. The patient was seen alone.  Drs. Lindi Adie and/or Burr Medico were available to discuss this case as needed.    _______________________________________________________________________ For Office Staff:  Number of people involved in session: 1 Was an Intern/ student involved with case: no

## 2021-09-01 ENCOUNTER — Encounter: Payer: Self-pay | Admitting: Genetic Counselor

## 2021-09-01 ENCOUNTER — Telehealth: Payer: Self-pay | Admitting: Genetic Counselor

## 2021-09-01 DIAGNOSIS — R059 Cough, unspecified: Secondary | ICD-10-CM | POA: Diagnosis not present

## 2021-09-01 DIAGNOSIS — Z1379 Encounter for other screening for genetic and chromosomal anomalies: Secondary | ICD-10-CM | POA: Insufficient documentation

## 2021-09-01 NOTE — Telephone Encounter (Signed)
I contacted Mr. Fregia to discuss his genetic testing results. No pathogenic variants were identified in the 77 genes analyzed. Detailed clinic note to follow.  The test report has been scanned into EPIC and is located under the Molecular Pathology section of the Results Review tab.  A portion of the result report is included below for reference.   Lucille Passy, MS, [facility] Genetic Counselor [email or web address] (P) [phone]

## 2021-09-06 ENCOUNTER — Ambulatory Visit: Payer: Self-pay | Admitting: Genetic Counselor

## 2021-09-06 NOTE — Progress Notes (Signed)
HPI:   Mr. Lachapelle was previously seen in the [place] clinic due to a personal history of cancer and concerns regarding a hereditary predisposition to cancer. Please refer to our prior cancer genetics clinic note for more information regarding our discussion, assessment and recommendations, at the time. Mr. Poli recent genetic test results were disclosed to him, as were recommendations warranted by these results. These results and recommendations are discussed in more detail below.  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 In 2023, at the age of 89, Mr. Matthews was diagnosed with left clear cell renal cell carcinoma.  FAMILY HISTORY:  We obtained a detailed, 4-generation family history.  He reports no family history of cancer or hereditary cancer genetic testing. He reports maternal and paternal Ashkenazi Jewish ancestry.    GENETIC TEST RESULTS:  The [facility] CancerNext-Expanded Panel found no pathogenic mutations.   The CancerNext-Expanded gene panel offered by [employer] and includes sequencing and rearrangement analysis for the following 77 genes (RNA analysis was not performed due to insufficient sample quality): AIP, ALK, APC, ATM, AXIN2, BAP1, BARD1, BLM, BMPR1A, BRCA1, BRCA2, BRIP1, CDC73, CDH1, CDK4, CDKN1B, CDKN2A, CHEK2, CTNNA1, DICER1, FANCC, FH, FLCN, GALNT12, KIF1B, LZTR1, MAX, MEN1, MET, MLH1, MSH2, MSH3, MSH6, MUTYH, NBN, NF1, NF2, NTHL1, PALB2, PHOX2B, PMS2, POT1, PRKAR1A, PTCH1, PTEN, RAD51C, RAD51D, RB1, RECQL, RET, SDHA, SDHAF2, SDHB, SDHC, SDHD, SMAD4, SMARCA4, SMARCB1, SMARCE1, STK11, SUFU, TMEM127, TP53, TSC1, TSC2, VHL and XRCC2 (sequencing and deletion/duplication); EGFR, EGLN1, HOXB13, KIT, MITF, PDGFRA, POLD1, and POLE (sequencing only); EPCAM and GREM1 (deletion/duplication only).   The test report has been scanned into EPIC and is located under the Molecular Pathology  section of the Results Review tab.  A portion of the result report is included below for reference. Genetic testing reported out on 08/29/2021.         Even though a pathogenic variant was not identified, possible explanations for the cancer in the family may include: There may be no hereditary risk for cancer in the family. Mr. Carroll personal history of cancer may be due to other genetic or environmental factors. There may be a gene mutation in one of these genes that current testing methods cannot detect, but that chance is small. There could be another gene that has not yet been discovered, or that we have not yet tested, that is responsible for the cancer diagnoses in the family.   Therefore, it is important to remain in touch with cancer genetics in the future so that we can continue to offer Mr. Adrian the most up to date genetic testing.   ADDITIONAL GENETIC TESTING:  We discussed with Mr. Knupp that his genetic testing was fairly extensive.  If there are genes identified to increase cancer risk that can be analyzed in the future, we would be happy to discuss and coordinate this testing at that time.      CANCER SCREENING RECOMMENDATIONS:  Mr. Daniello test result is considered negative (normal).  This means that we have not identified a hereditary cause for his personal history of cancer at this time. Most cancers happen by chance and this negative test suggests that his cancer may fall into this category.    An individual's cancer risk and medical management are not determined by genetic test results alone. Overall cancer risk assessment incorporates additional factors, including personal medical history, family history, and any available genetic information that may  result in a personalized plan for cancer prevention and surveillance. Therefore, it is recommended he continue to follow the cancer management and screening guidelines provided by his oncology and primary  healthcare provider.  RECOMMENDATIONS FOR FAMILY MEMBERS:   Since he did not inherit a mutation in a cancer predisposition gene included on this panel, his children could not have inherited a mutation from him in one of these genes.  FOLLOW-UP:  Cancer genetics is a rapidly advancing field and it is possible that new genetic tests will be appropriate for him and/or his family members in the future. We encouraged him to remain in contact with cancer genetics on an annual basis so we can update his personal and family histories and let him know of advances in cancer genetics that may benefit this family.   Our contact number was provided. Mr. Shenberger questions were answered to his satisfaction, and he knows he is welcome to call us at anytime with additional questions or concerns.   Lucille Passy, MS, [facility] Genetic Counselor [email or web address] [phone]) [phone]

## 2021-09-07 ENCOUNTER — Encounter: Payer: Self-pay | Admitting: Genetic Counselor

## 2021-09-11 NOTE — Progress Notes (Unsigned)
Cardiology Office Note   Date:  09/12/2021   ID:  [name], DOB Nov 27, 1955, MRN [id number]  PCP:  Josetta Huddle, MD  Cardiologist:   Dorris Carnes, MD    F/U of mitral valve dz       History of Present Illness: [name] is a 66 y.o. 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 [facility]   Rates controlled   He underwent cardioversion on Sept 14 2022  Amiodarone was stopped  Pt off anticoagulation   I saw the pt in May 2023   After I saw echo ordered  (see below) He went on to have surgery with removal of L renal  mass. Recovered well.     Since I saw him he says his breathing is good   He denies CP  No palpitations      Outpatient Medications Prior to Visit  Medication Sig Dispense Refill   amoxicillin (AMOXIL) 500 MG capsule Take 4 capsules (2,000 mg total) by mouth as directed. 4 pills one hour before dental appointment 4 capsule 2   buPROPion (WELLBUTRIN XL) 150 MG 24 hr tablet Take 150 mg by mouth daily.     cetirizine (ZYRTEC) 10 MG tablet Take 10 mg by mouth daily as needed for allergies.     diphenhydramine-acetaminophen (TYLENOL PM) 25-500 MG TABS tablet Take 2  tablets by mouth at bedtime.     docusate sodium (COLACE) 100 MG capsule Take 1 capsule (100 mg total) by mouth 2 (two) times daily.     finasteride (PROSCAR) 5 MG tablet Take 1 tablet (5 mg total) by mouth daily. 30 tablet 0   losartan (COZAAR) 50 MG tablet Take 50 mg by mouth daily.     oxybutynin (DITROPAN) 5 MG tablet Take 5 mg by mouth at bedtime.     oxymetazoline (AFRIN) 0.05 % nasal spray Place 1 spray into both nostrils 2 (two) times daily as needed for congestion.     rosuvastatin (CRESTOR) 10 MG tablet Take 1 tablet (10 mg total) by mouth daily. 90 tablet 3   tamsulosin (FLOMAX) 0.4 MG CAPS capsule Take 0.4 mg by mouth daily.     HYDROcodone-acetaminophen (NORCO) 5-325 MG tablet Take 1-2 tablets by mouth every 6 (six) hours as needed for moderate pain or severe pain. (Patient not taking: Reported on 09/12/2021) 20 tablet 0   No facility-administered medications prior  to visit.     Allergies:   Patient has no known allergies.   Past Medical History:  Diagnosis Date   Bacteremia 110/2017   Cancer [facility])    renal mass at preop   CVA (cerebral infarction)    hemmorhagic  CVA   Depression    Dyslipidemia    Endocarditis    MV Vegitation   History of kidney stones    Hypertension    Mitral regurgitation    Myocardial infarction [facility])    Stroke [facility])     Past Surgical History:  Procedure Laterality Date   CARDIAC CATHETERIZATION     CARDIOVERSION N/A 09/14/2020   Procedure: CARDIOVERSION;  Surgeon: Elouise Munroe, MD;  Location: [facility];  Service: Cardiovascular;  Laterality: N/A;   MITRAL VALVE REPAIR     NO PAST SURGERIES     RIGHT/LEFT HEART CATH AND CORONARY ANGIOGRAPHY N/A 05/11/2020   Procedure: RIGHT/LEFT HEART CATH AND CORONARY ANGIOGRAPHY;  Surgeon: Sherren Mocha, MD;  Location: [facility] CV LAB;  Service: Cardiovascular;  Laterality: N/A;   ROBOT ASSISTED LAPAROSCOPIC NEPHRECTOMY Right 02/22/2016   Procedure: XI ROBOTIC ASSISTED LAPAROSCOPIC  NEPHRECTOMY;  Surgeon: Alexis Frock, MD;  Location: [facility] ORS;  Service: Urology;  Laterality: Right;   ROBOTIC ASSITED PARTIAL NEPHRECTOMY Left 06/07/2021   Procedure: XI ROBOTIC ASSITED PARTIAL NEPHRECTOMY;  Surgeon: Alexis Frock, MD;  Location: [facility] ORS;  Service: Urology;  Laterality: Left;  3 HRS   TEE WITHOUT CARDIOVERSION N/A 01/28/2020   Procedure: TRANSESOPHAGEAL ECHOCARDIOGRAM (TEE);  Surgeon: Fay Records, MD;  Location: [facility] ENDOSCOPY;  Service: Cardiovascular;  Laterality: N/A;   TONSILLECTOMY       Social History:  The patient  reports that he has never smoked. He has never used smokeless tobacco. He reports current alcohol use of about 4.0 - 5.0 standard drinks of alcohol per week. He reports current drug use. Drug: Marijuana.   Family History:  The patient's family history includes Heart disease in his mother.    ROS:  Please see the history of present illness. All other systems are reviewed and  Negative to the above problem except as noted.    PHYSICAL EXAM: VS:  BP 132/80   Pulse 89   Ht '5\' 4"'$  (1.626 m)   Wt 184 lb (83.5 kg)   SpO2 98%   BMI 31.58 kg/m   GEN: Obese 66 yo in no acute distress  HEENT: normal  Neck: JVP normal no carotid bruits Cardiac: RRR; No murmur Respiratory:  clear to auscultation bilaterally,  GI: soft, nontender, nondistended, + BS   MS: no deformity Moving all extremities   Skin: warm and dry, no rash Neuro:  Strength and sensation are intact Psych: euthymic mood, full affect   EKG:  EKG is not ordered today.   Echo   05/3021  Left ventricular ejection fraction, by estimation, is 55%%. The left ventricle has normal function. The left ventricle has no regional wall motion abnormalities. Left ventricular diastolic parameters are consistent with Grade I diastolic dysfunction (impaired relaxation). Elevated left atrial pressure. 1. 2. Right ventricular systolic function is normal. The right ventricular size is normal. 3. Left atrial  size was mild to moderately dilated. 4. Right atrial size was mild to moderately dilated. S/p MV repair. Mean gradient through the valve is 3 mm Hg. . The mitral valve has been repaired/replaced. Trivial mitral valve regurgitation. 5. The aortic valve is tricuspid. Aortic valve regurgitation is mild. Aortic valve sclerosis/calcification is present, without any  evidence of aortic stenosis. 6. Aortic dilatation noted. There is mild dilatation of the aortic root, measuring 43 mm. There is borderline dilatation of the ascending aorta, measuring 38 mm. 7. Comparison(s): EF 55%, mild AI, AOR 64m, MVP with mod-severe MR. L heart cath5/22  Conclusion  1.  Widely patent coronary arteries with minimal irregularity, right dominant 2.  Normal/low intracardiac filling pressures with no evidence of pulmonary hypertension 3.  Preserved cardiac output Lipid Panel    Component Value Date/Time   CHOL 151 12/13/2017 1147   TRIG 114 12/13/2017 1147   HDL 53 12/13/2017 1147   CHOLHDL 2.8 12/13/2017 1147   CHOLHDL 4 01/19/2010 0904   VLDL 22.4 01/19/2010 0904   LDLCALC 75 12/13/2017 1147   LDLDIRECT 164.1 01/19/2010 0904      Wt Readings from Last 3 Encounters:  09/12/21 184 lb (83.5 kg)  06/08/21 192 lb 3.9 oz (87.2 kg)  05/31/21 189 lb (85.7 kg)      ASSESSMENT AND PLAN:  1.  Mitral valve disease.  [facility]  He has done very well.  Follow   2 PAF  Pt had afib post surgery   Cardioverted   Treated with amiodarone for short term.    Off this   Off anticoagulation      3.  Hypertension.  This is well controlled   4.  Dyslipidemia.  Last labs LDL 105, HDL 50.  Will get lipomed  5.  Renal.  [facility]    Current medicines are reviewed at length with the patient today.  The patient does not have concerns regarding medicines.  Signed, PDorris Carnes MD  09/12/2021 10:36 PM    [place] [place]  [id number]: [phone] Fax: [phone]

## 2021-09-12 ENCOUNTER — Ambulatory Visit: Payer: Medicare HMO | Attending: Internal Medicine | Admitting: Internal Medicine

## 2021-09-12 ENCOUNTER — Encounter: Payer: Self-pay | Admitting: Internal Medicine

## 2021-09-12 VITALS — BP 132/80 | HR 89 | Ht 64.0 in | Wt 184.0 lb

## 2021-09-12 DIAGNOSIS — E785 Hyperlipidemia, unspecified: Secondary | ICD-10-CM

## 2021-09-12 NOTE — Patient Instructions (Signed)
Medication Instructions:   *If you need a refill on your cardiac medications before your next appointment, please call your pharmacy*   Lab Work: NMR If you have labs (blood work) drawn today and your tests are completely normal, you will receive your results only by: [place] (if you have MyChart) OR A paper copy in the mail If you have any lab test that is abnormal or we need to change your treatment, we will call you to review the results.   Testing/Procedures:     Follow-Up: At [facility], you and your health needs are our priority.  As part of our continuing mission to provide you with exceptional heart care, we have created designated Provider Care Teams.  These Care Teams include your primary Cardiologist (physician) and Advanced Practice Providers (APPs -  Physician Assistants and Nurse Practitioners) who all work together to provide you with the care you need, when you need it.  We recommend signing up for the patient portal called "MyChart".  Sign up information is provided on this After Visit Summary.  MyChart is used to connect with patients for Virtual Visits (Telemedicine).  Patients are able to view lab/test results, encounter notes, upcoming appointments, etc.  Non-urgent messages can be sent to your provider as well.   To learn more about what you can do with MyChart, go to [email or web address].    Your next appointment:   8 month(s)  The format for your next appointment:   In Person  Provider:   Dorris Carnes, MD     Other Instructions   Important Information About Sugar

## 2021-09-13 LAB — NMR, LIPOPROFILE
Cholesterol, Total: 162 mg/dL (ref 100–199)
HDL Particle Number: 24.4 umol/L — ABNORMAL LOW (ref >=30.5)
HDL-C: 42 mg/dL (ref >39)
LDL Particle Number: 1160 nmol/L — ABNORMAL HIGH (ref <1000)
LDL Size: 20.4 nm — ABNORMAL LOW (ref >20.5)
LDL-C (NIH Calc): 88 mg/dL (ref 0–99)
LP-IR Score: 52 — ABNORMAL HIGH (ref <=45)
Small LDL Particle Number: 596 nmol/L — ABNORMAL HIGH (ref <=527)
Triglycerides: 185 mg/dL — ABNORMAL HIGH (ref 0–149)

## 2021-09-14 ENCOUNTER — Telehealth: Payer: Self-pay

## 2021-09-14 DIAGNOSIS — E785 Hyperlipidemia, unspecified: Secondary | ICD-10-CM

## 2021-09-14 MED ORDER — ROSUVASTATIN CALCIUM 20 MG PO TABS: 20.0000 mg | ORAL_TABLET | Freq: Every day | ORAL | 3 refills | Status: DC

## 2021-09-14 NOTE — Telephone Encounter (Signed)
Result and message sent to the pts My Chart for their review.   

## 2021-09-14 NOTE — Telephone Encounter (Signed)
-----   Message from Fay Records, MD sent at 09/14/2021  5:06 AM EDT ----- Lipids    LDL is 88.  With mild plaquing noted on aorta, recommended that LDL be lower    I would recomm increasing Crestor to 20 mg    Check lipomed in 8 to 12 wks  Diet recomm   Limit processed foods Limit sweets, excess carbs  Lots of vegetables, more Mediterranean style

## 2021-09-18 ENCOUNTER — Telehealth: Payer: Self-pay

## 2021-09-18 NOTE — Telephone Encounter (Signed)
-----   Message from Fay Records, MD sent at 09/14/2021  5:06 AM EDT ----- Lipids    LDL is 88.  With mild plaquing noted on aorta, recommended that LDL be lower    I would recomm increasing Crestor to 20 mg    Check lipomed in 8 to 12 wks  Diet recomm   Limit processed foods Limit sweets, excess carbs  Lots of vegetables, more Mediterranean style

## 2021-09-18 NOTE — Telephone Encounter (Signed)
Left a message for the pt to call back to make a lab appt.

## 2021-09-27 NOTE — Telephone Encounter (Signed)
Pt to have labs 12/01/21.

## 2021-09-28 DIAGNOSIS — R350 Frequency of micturition: Secondary | ICD-10-CM | POA: Diagnosis not present

## 2021-09-28 DIAGNOSIS — N400 Enlarged prostate without lower urinary tract symptoms: Secondary | ICD-10-CM | POA: Diagnosis not present

## 2021-09-28 DIAGNOSIS — Z8589 Personal history of malignant neoplasm of other organs and systems: Secondary | ICD-10-CM | POA: Diagnosis not present

## 2021-09-28 DIAGNOSIS — R69 Illness, unspecified: Secondary | ICD-10-CM | POA: Diagnosis not present

## 2021-09-28 DIAGNOSIS — L659 Nonscarring hair loss, unspecified: Secondary | ICD-10-CM | POA: Diagnosis not present

## 2021-09-28 DIAGNOSIS — Z8673 Personal history of transient ischemic attack (TIA), and cerebral infarction without residual deficits: Secondary | ICD-10-CM | POA: Diagnosis not present

## 2021-09-28 DIAGNOSIS — R030 Elevated blood-pressure reading, without diagnosis of hypertension: Secondary | ICD-10-CM | POA: Diagnosis not present

## 2021-09-28 DIAGNOSIS — R32 Unspecified urinary incontinence: Secondary | ICD-10-CM | POA: Diagnosis not present

## 2021-09-28 DIAGNOSIS — Z85528 Personal history of other malignant neoplasm of kidney: Secondary | ICD-10-CM | POA: Diagnosis not present

## 2021-09-28 DIAGNOSIS — Z008 Encounter for other general examination: Secondary | ICD-10-CM | POA: Diagnosis not present

## 2021-10-13 DIAGNOSIS — H2513 Age-related nuclear cataract, bilateral: Secondary | ICD-10-CM | POA: Diagnosis not present

## 2021-10-13 DIAGNOSIS — H18513 Endothelial corneal dystrophy, bilateral: Secondary | ICD-10-CM | POA: Diagnosis not present

## 2021-10-13 DIAGNOSIS — H5203 Hypermetropia, bilateral: Secondary | ICD-10-CM | POA: Diagnosis not present

## 2021-11-07 ENCOUNTER — Other Ambulatory Visit (HOSPITAL_COMMUNITY): Payer: Self-pay | Admitting: Urology

## 2021-11-07 ENCOUNTER — Ambulatory Visit (HOSPITAL_COMMUNITY)
Admission: RE | Admit: 2021-11-07 | Discharge: 2021-11-07 | Disposition: A | Discharge status: Home / Self Care | Payer: Medicare HMO | Source: Ambulatory Visit | Attending: Urology | Admitting: Urology

## 2021-11-07 DIAGNOSIS — J90 Pleural effusion, not elsewhere classified: Secondary | ICD-10-CM | POA: Diagnosis not present

## 2021-11-07 DIAGNOSIS — C641 Malignant neoplasm of right kidney, except renal pelvis: Secondary | ICD-10-CM | POA: Diagnosis not present

## 2021-12-01 ENCOUNTER — Ambulatory Visit: Payer: Medicare HMO | Attending: Internal Medicine

## 2021-12-01 ENCOUNTER — Other Ambulatory Visit: Payer: Self-pay | Admitting: Internal Medicine

## 2021-12-01 DIAGNOSIS — E785 Hyperlipidemia, unspecified: Secondary | ICD-10-CM | POA: Diagnosis not present

## 2021-12-03 LAB — NMR, LIPOPROFILE
Cholesterol, Total: 199 mg/dL (ref 100–199)
HDL Particle Number: 39.1 umol/L (ref >=30.5)
HDL-C: 55 mg/dL (ref >39)
LDL Particle Number: 1257 nmol/L — ABNORMAL HIGH (ref <1000)
LDL Size: 20.9 nm (ref >20.5)
LDL-C (NIH Calc): 117 mg/dL — ABNORMAL HIGH (ref 0–99)
LP-IR Score: 60 — ABNORMAL HIGH (ref <=45)
Small LDL Particle Number: 525 nmol/L (ref <=527)
Triglycerides: 155 mg/dL — ABNORMAL HIGH (ref 0–149)

## 2021-12-05 ENCOUNTER — Telehealth: Payer: Self-pay

## 2021-12-05 DIAGNOSIS — E785 Hyperlipidemia, unspecified: Secondary | ICD-10-CM

## 2021-12-05 MED ORDER — ROSUVASTATIN CALCIUM 20 MG PO TABS: 20.0000 mg | ORAL_TABLET | Freq: Every day | ORAL | 3 refills | Status: DC

## 2021-12-05 NOTE — Telephone Encounter (Signed)
Result and message sent to the pts My Chart for their review.   Need lab date.

## 2021-12-05 NOTE — Telephone Encounter (Signed)
-----   Message from Dorris Carnes V, MD sent at 12/04/2021  8:58 PM EST ----- Called pt  He was still taking 10 mg Crstor   Will doubleup Call in 20 mg Crestor Check lipomed, liver panel and Hgb A1 C in 12 wks    Reviewed diet   Cut back on carbs

## 2021-12-07 NOTE — Telephone Encounter (Signed)
Lab appt made for the pt for 03/07/22.

## 2021-12-11 DIAGNOSIS — C641 Malignant neoplasm of right kidney, except renal pelvis: Secondary | ICD-10-CM | POA: Diagnosis not present

## 2021-12-11 DIAGNOSIS — Z125 Encounter for screening for malignant neoplasm of prostate: Secondary | ICD-10-CM | POA: Diagnosis not present

## 2021-12-13 DIAGNOSIS — C642 Malignant neoplasm of left kidney, except renal pelvis: Secondary | ICD-10-CM | POA: Diagnosis not present

## 2021-12-13 DIAGNOSIS — N2889 Other specified disorders of kidney and ureter: Secondary | ICD-10-CM | POA: Diagnosis not present

## 2021-12-13 DIAGNOSIS — Z85528 Personal history of other malignant neoplasm of kidney: Secondary | ICD-10-CM | POA: Diagnosis not present

## 2021-12-13 DIAGNOSIS — K7689 Other specified diseases of liver: Secondary | ICD-10-CM | POA: Diagnosis not present

## 2021-12-13 DIAGNOSIS — K449 Diaphragmatic hernia without obstruction or gangrene: Secondary | ICD-10-CM | POA: Diagnosis not present

## 2021-12-18 DIAGNOSIS — Z905 Acquired absence of kidney: Secondary | ICD-10-CM | POA: Diagnosis not present

## 2021-12-18 DIAGNOSIS — C641 Malignant neoplasm of right kidney, except renal pelvis: Secondary | ICD-10-CM | POA: Diagnosis not present

## 2021-12-18 DIAGNOSIS — C642 Malignant neoplasm of left kidney, except renal pelvis: Secondary | ICD-10-CM | POA: Diagnosis not present

## 2021-12-18 DIAGNOSIS — Z125 Encounter for screening for malignant neoplasm of prostate: Secondary | ICD-10-CM | POA: Diagnosis not present

## 2022-02-05 DIAGNOSIS — D631 Anemia in chronic kidney disease: Secondary | ICD-10-CM | POA: Diagnosis not present

## 2022-02-05 DIAGNOSIS — I129 Hypertensive chronic kidney disease with stage 1 through stage 4 chronic kidney disease, or unspecified chronic kidney disease: Secondary | ICD-10-CM | POA: Diagnosis not present

## 2022-02-05 DIAGNOSIS — Z905 Acquired absence of kidney: Secondary | ICD-10-CM | POA: Diagnosis not present

## 2022-02-05 DIAGNOSIS — N1831 Chronic kidney disease, stage 3a: Secondary | ICD-10-CM | POA: Diagnosis not present

## 2022-02-08 DIAGNOSIS — D2262 Melanocytic nevi of left upper limb, including shoulder: Secondary | ICD-10-CM | POA: Diagnosis not present

## 2022-02-08 DIAGNOSIS — D2271 Melanocytic nevi of right lower limb, including hip: Secondary | ICD-10-CM | POA: Diagnosis not present

## 2022-02-08 DIAGNOSIS — D2272 Melanocytic nevi of left lower limb, including hip: Secondary | ICD-10-CM | POA: Diagnosis not present

## 2022-02-08 DIAGNOSIS — L814 Other melanin hyperpigmentation: Secondary | ICD-10-CM | POA: Diagnosis not present

## 2022-02-08 DIAGNOSIS — L821 Other seborrheic keratosis: Secondary | ICD-10-CM | POA: Diagnosis not present

## 2022-02-08 DIAGNOSIS — D225 Melanocytic nevi of trunk: Secondary | ICD-10-CM | POA: Diagnosis not present

## 2022-02-08 DIAGNOSIS — D2261 Melanocytic nevi of right upper limb, including shoulder: Secondary | ICD-10-CM | POA: Diagnosis not present

## 2022-03-03 ENCOUNTER — Other Ambulatory Visit: Payer: Self-pay | Admitting: Internal Medicine

## 2022-03-06 DIAGNOSIS — R3915 Urgency of urination: Secondary | ICD-10-CM | POA: Diagnosis not present

## 2022-03-07 ENCOUNTER — Ambulatory Visit: Payer: Medicare HMO | Attending: Internal Medicine

## 2022-03-07 DIAGNOSIS — I340 Nonrheumatic mitral (valve) insufficiency: Secondary | ICD-10-CM | POA: Diagnosis not present

## 2022-03-07 DIAGNOSIS — I059 Rheumatic mitral valve disease, unspecified: Secondary | ICD-10-CM | POA: Diagnosis not present

## 2022-03-07 DIAGNOSIS — E785 Hyperlipidemia, unspecified: Secondary | ICD-10-CM | POA: Diagnosis not present

## 2022-03-07 DIAGNOSIS — I159 Secondary hypertension, unspecified: Secondary | ICD-10-CM | POA: Diagnosis not present

## 2022-03-07 DIAGNOSIS — Z79899 Other long term (current) drug therapy: Secondary | ICD-10-CM | POA: Diagnosis not present

## 2022-03-07 DIAGNOSIS — I4819 Other persistent atrial fibrillation: Secondary | ICD-10-CM | POA: Diagnosis not present

## 2022-03-07 DIAGNOSIS — Z131 Encounter for screening for diabetes mellitus: Secondary | ICD-10-CM | POA: Diagnosis not present

## 2022-03-09 ENCOUNTER — Other Ambulatory Visit: Payer: Self-pay | Admitting: Urology

## 2022-03-09 LAB — NMR, LIPOPROFILE
Cholesterol, Total: 150 mg/dL (ref 100–199)
HDL Particle Number: 30.7 umol/L (ref >=30.5)
HDL-C: 42 mg/dL (ref >39)
LDL Particle Number: 756 nmol/L (ref <1000)
LDL Size: 20.5 nm — ABNORMAL LOW (ref >20.5)
LDL-C (NIH Calc): 76 mg/dL (ref 0–99)
LP-IR Score: 52 — ABNORMAL HIGH (ref <=45)
Small LDL Particle Number: 312 nmol/L (ref <=527)
Triglycerides: 190 mg/dL — ABNORMAL HIGH (ref 0–149)

## 2022-03-09 LAB — HEMOGLOBIN A1C
Est. average glucose Bld gHb Est-mCnc: 120 mg/dL
Hgb A1c MFr Bld: 5.8 % — ABNORMAL HIGH (ref 4.8–5.6)

## 2022-03-09 LAB — HEPATIC FUNCTION PANEL
ALT: 25 IU/L (ref 0–44)
AST: 21 IU/L (ref 0–40)
Albumin: 4.3 g/dL (ref 3.9–4.9)
Alkaline Phosphatase: 75 IU/L (ref 44–121)
Bilirubin Total: 0.3 mg/dL (ref 0.0–1.2)
Bilirubin, Direct: 0.11 mg/dL (ref 0.00–0.40)
Total Protein: 6.5 g/dL (ref 6.0–8.5)

## 2022-03-16 NOTE — Patient Instructions (Addendum)
SURGICAL WAITING ROOM VISITATION Patients having surgery or a procedure may have no more than 2 support people in the waiting area - these visitors may rotate.    If the patient needs to stay at the hospital during part of their recovery, the visitor guidelines for inpatient rooms apply. Pre-op nurse will coordinate an appropriate time for 1 support person to accompany patient in pre-op.  This support person may not rotate.    Please refer to the [facility] website for the visitor guidelines for Inpatients (after your surgery is over and you are in a regular room).   Due to an increase in RSV and influenza rates and associated hospitalizations, children ages 78 and under may not visit patients in [place].     Your procedure is scheduled on: 03-28-22   Report to [facility] Main Entrance    Report to admitting at 7:30 AM   Call this number if you have problems the morning of surgery [phone]   Do not eat food or drink liquids :After Midnight.           If you have questions, please contact your surgeon's office.   FOLLOW  ANY ADDITIONAL PRE OP INSTRUCTIONS YOU RECEIVED FROM YOUR SURGEON'S OFFICE!!!     Oral Hygiene is also important to reduce your risk of infection.                                    Remember - BRUSH YOUR TEETH THE MORNING OF SURGERY WITH YOUR REGULAR TOOTHPASTE   Do NOT smoke after Midnight   Take these medicines the morning of surgery with A SIP OF WATER:   Bupropion  Pepcid  Finasteride  Pantoprazole  Rosuvastatin  Tamsulosin  Tylenol if needed  Bring CPAP mask and tubing day of surgery.                              You may not have any metal on your body including jewelry, and body piercing             Do not wear  lotions, powders, cologne, or deodorant              Men may shave face and neck.   Do not bring valuables to the hospital. [place].   Contacts, dentures or bridgework  may not be worn into surgery.   Bring small overnight bag day of surgery.   DO NOT [place]. PHARMACY WILL DISPENSE MEDICATIONS LISTED ON YOUR MEDICATION LIST TO YOU DURING YOUR ADMISSION [place]!    Special Instructions: Bring a copy of your healthcare power of attorney and living will documents the day of surgery if you haven't scanned them before.              Please read over the following fact sheets you were given: IF [place]  If you received a COVID test during your pre-op visit  it is requested that you wear a mask when out in public, stay away from anyone that may not be feeling well and notify your surgeon if you develop symptoms. If you test positive for Covid or have been in contact with anyone that has tested positive in the  last 10 days please notify you surgeon.  [place] - Preparing for Surgery Before surgery, you can play an important role.  Because skin is not sterile, your skin needs to be as free of germs as possible.  You can reduce the number of germs on your skin by washing with CHG (chlorahexidine gluconate) soap before surgery.  CHG is an antiseptic cleaner which kills germs and bonds with the skin to continue killing germs even after washing. Please DO NOT use if you have an allergy to CHG or antibacterial soaps.  If your skin becomes reddened/irritated stop using the CHG and inform your nurse when you arrive at Short Stay. Do not shave (including legs and underarms) for at least 48 hours prior to the first CHG shower.  You may shave your face/neck.  Please follow these instructions carefully:  1.  Shower with CHG Soap the night before surgery and the  morning of surgery.  2.  If you choose to wash your hair, wash your hair first as usual with your normal  shampoo.  3.  After you shampoo, rinse your hair and body thoroughly to remove the shampoo.                              4.  Use CHG as you would any other liquid soap.  You can apply chg directly to the skin and wash.  Gently with a scrungie or clean washcloth.  5.  Apply the CHG Soap to your body ONLY FROM THE NECK DOWN.   Do   not use on face/ open                           Wound or open sores. Avoid contact with eyes, ears mouth and   genitals (private parts).                       Wash face,  Genitals (private parts) with your normal soap.             6.  Wash thoroughly, paying special attention to the area where your    surgery  will be performed.  7.  Thoroughly rinse your body with warm water from the neck down.  8.  DO NOT shower/wash with your normal soap after using and rinsing off the CHG Soap.                9.  Pat yourself dry with a clean towel.            10.  Wear clean pajamas.            11.  Place clean sheets on your bed the night of your first shower and do not  sleep with pets. Day of Surgery : Do not apply any lotions/deodorants the morning of surgery.  Please wear clean clothes to the hospital/surgery center.  FAILURE TO FOLLOW THESE INSTRUCTIONS MAY RESULT IN THE CANCELLATION OF YOUR SURGERY  PATIENT SIGNATURE_________________________________  NURSE SIGNATURE__________________________________  ________________________________________________________________________

## 2022-03-16 NOTE — Progress Notes (Signed)
COVID Vaccine Completed:  Yes  Date of COVID positive in last 90 days:  PCP - Josetta Huddle, MD Cardiologist - Dorris Carnes, MD  Chest x-ray - 11-07-21 Epic EKG - 06-09-21 Epic Stress Test - 12-13-05 Epic ECHO - 05-30-21 Epic Cardiac Cath - 05-11-20 Epic Pacemaker/ICD device last checked: Spinal Cord Stimulator:  Bowel Prep -   Sleep Study -  CPAP -   Fasting Blood Sugar -  Checks Blood Sugar _____ times a day  Last dose of GLP1 agonist-  N/A GLP1 instructions:  N/A   Last dose of SGLT-2 inhibitors-  N/A SGLT-2 instructions: N/A   Blood Thinner Instructions: Aspirin Instructions: Last Dose:  Activity level:  Can go up a flight of stairs and perform activities of daily living without stopping and without symptoms of chest pain or shortness of breath.  Able to exercise without symptoms  Unable to go up a flight of stairs without symptoms of     Anesthesia review:  Afib, Hx of MI, CVA, HTN.  Post MV repair 2022  Hx of seizures - last seizure   Patient denies shortness of breath, fever, cough and chest pain at PAT appointment  Patient verbalized understanding of instructions that were given to them at the PAT appointment. Patient was also instructed that they will need to review over the PAT instructions again at home before surgery.

## 2022-03-19 ENCOUNTER — Other Ambulatory Visit: Payer: Self-pay

## 2022-03-19 ENCOUNTER — Encounter (HOSPITAL_COMMUNITY)
Admission: RE | Admit: 2022-03-19 | Discharge: 2022-03-19 | Disposition: A | Discharge status: Home / Self Care | Payer: Medicare HMO | Source: Ambulatory Visit | Attending: Urology | Admitting: Urology

## 2022-03-19 ENCOUNTER — Encounter (HOSPITAL_COMMUNITY): Payer: Self-pay

## 2022-03-19 VITALS — BP 135/79 | HR 92 | Temp 98.3°F | Resp 16 | Ht 68.0 in | Wt 185.4 lb

## 2022-03-19 DIAGNOSIS — Z01812 Encounter for preprocedural laboratory examination: Secondary | ICD-10-CM | POA: Diagnosis not present

## 2022-03-19 DIAGNOSIS — E785 Hyperlipidemia, unspecified: Secondary | ICD-10-CM | POA: Insufficient documentation

## 2022-03-19 DIAGNOSIS — I340 Nonrheumatic mitral (valve) insufficiency: Secondary | ICD-10-CM | POA: Insufficient documentation

## 2022-03-19 DIAGNOSIS — N189 Chronic kidney disease, unspecified: Secondary | ICD-10-CM | POA: Insufficient documentation

## 2022-03-19 DIAGNOSIS — Z79899 Other long term (current) drug therapy: Secondary | ICD-10-CM | POA: Insufficient documentation

## 2022-03-19 DIAGNOSIS — F32A Depression, unspecified: Secondary | ICD-10-CM | POA: Insufficient documentation

## 2022-03-19 DIAGNOSIS — I2510 Atherosclerotic heart disease of native coronary artery without angina pectoris: Secondary | ICD-10-CM | POA: Diagnosis not present

## 2022-03-19 DIAGNOSIS — I252 Old myocardial infarction: Secondary | ICD-10-CM | POA: Diagnosis not present

## 2022-03-19 DIAGNOSIS — K219 Gastro-esophageal reflux disease without esophagitis: Secondary | ICD-10-CM | POA: Diagnosis not present

## 2022-03-19 DIAGNOSIS — N400 Enlarged prostate without lower urinary tract symptoms: Secondary | ICD-10-CM | POA: Insufficient documentation

## 2022-03-19 DIAGNOSIS — I4891 Unspecified atrial fibrillation: Secondary | ICD-10-CM | POA: Insufficient documentation

## 2022-03-19 DIAGNOSIS — Z87442 Personal history of urinary calculi: Secondary | ICD-10-CM | POA: Diagnosis not present

## 2022-03-19 DIAGNOSIS — I10 Essential (primary) hypertension: Secondary | ICD-10-CM

## 2022-03-19 DIAGNOSIS — Z8673 Personal history of transient ischemic attack (TIA), and cerebral infarction without residual deficits: Secondary | ICD-10-CM | POA: Insufficient documentation

## 2022-03-19 DIAGNOSIS — I129 Hypertensive chronic kidney disease with stage 1 through stage 4 chronic kidney disease, or unspecified chronic kidney disease: Secondary | ICD-10-CM | POA: Insufficient documentation

## 2022-03-19 DIAGNOSIS — Z01818 Encounter for other preprocedural examination: Secondary | ICD-10-CM

## 2022-03-19 HISTORY — DX: Methicillin resistant Staphylococcus aureus infection, unspecified site: A49.02

## 2022-03-19 HISTORY — DX: Gastro-esophageal reflux disease without esophagitis: K21.9

## 2022-03-19 HISTORY — DX: Chronic kidney disease, unspecified: N18.9

## 2022-03-19 LAB — CBC
HCT: 41.4 % (ref 39.0–52.0)
Hemoglobin: 13.5 g/dL (ref 13.0–17.0)
MCH: 29.3 pg (ref 26.0–34.0)
MCHC: 32.6 g/dL (ref 30.0–36.0)
MCV: 89.8 fL (ref 80.0–100.0)
Platelets: 241 10*3/uL (ref 150–400)
RBC: 4.61 MIL/uL (ref 4.22–5.81)
RDW: 12.5 % (ref 11.5–15.5)
WBC: 6 10*3/uL (ref 4.0–10.5)
nRBC: 0 % (ref 0.0–0.2)

## 2022-03-19 LAB — BASIC METABOLIC PANEL
Anion gap: 7 (ref 5–15)
BUN: 26 mg/dL — ABNORMAL HIGH (ref 8–23)
CO2: 23 mmol/L (ref 22–32)
Calcium: 8.7 mg/dL — ABNORMAL LOW (ref 8.9–10.3)
Chloride: 108 mmol/L (ref 98–111)
Creatinine, Ser: 1.94 mg/dL — ABNORMAL HIGH (ref 0.61–1.24)
GFR, Estimated: 38 mL/min — ABNORMAL LOW (ref >60)
Glucose, Bld: 127 mg/dL — ABNORMAL HIGH (ref 70–99)
Potassium: 4.4 mmol/L (ref 3.5–5.1)
Sodium: 138 mmol/L (ref 135–145)

## 2022-03-19 LAB — SURGICAL PCR SCREEN

## 2022-03-20 NOTE — Progress Notes (Signed)
Anesthesia Chart Review   Case: [id number] Date/Time: 03/28/22 0930   Procedure: TRANSURETHRAL RESECTION OF THE PROSTATE (TURP)   Anesthesia type: General   Pre-op diagnosis: ENLARGED PROSTATE   Location: [place] / [facility]   Surgeons: Alexis Frock, MD       DISCUSSION:67 y.o. never smoker with h/o HTN, CVA, CAD, s/p mitral valve repair 2022, CKD, enlarged prostate scheduled for above procedure 03/28/2022 with Dr. Alexis Frock.   Pt had an episode of atrial fibrillation post operatively. No recurrence.   Pt last seen by cardiology 09/12/2021. Stable at this visit, asymptomatic.  VS: BP 135/79   Pulse 92   Temp 36.8 C (Oral)   Resp 16   Ht 5\' 8"  (1.727 m)   Wt 84.1 kg   SpO2 97%   BMI 28.19 kg/m   PROVIDERS: Josetta Huddle, MD is PCP   Cardiologist - Dorris Carnes, MD   LABS: Labs reviewed: Acceptable for surgery. (all labs ordered are listed, but only abnormal results are displayed)  Labs Reviewed  SURGICAL PCR SCREEN - Abnormal; Notable for the following components:      Result Value   MRSA, PCR   (*)    Value: INVALID, UNABLE TO DETERMINE THE PRESENCE OF TARGET DUE TO SPECIMEN INTEGRITY. RECOLLECTION REQUESTED.   Staphylococcus aureus   (*)    Value: INVALID, UNABLE TO DETERMINE THE PRESENCE OF TARGET DUE TO SPECIMEN INTEGRITY. RECOLLECTION REQUESTED.   All other components within normal limits  BASIC METABOLIC PANEL - Abnormal; Notable for the following components:   Glucose, Bld 127 (*)    BUN 26 (*)    Creatinine, Ser 1.94 (*)    Calcium 8.7 (*)    GFR, Estimated 38 (*)    All other components within normal limits  CBC     IMAGES:   EKG:   CV: Echo 05/30/2021 1. Left ventricular ejection fraction, by estimation, is 55%%. The left  ventricle has normal function. The left ventricle has no regional wall  motion abnormalities. Left ventricular diastolic parameters are consistent  with Grade I diastolic dysfunction  (impaired relaxation).  Elevated left atrial pressure.   2. Right ventricular systolic function is normal. The right ventricular  size is normal.   3. Left atrial size was mild to moderately dilated.   4. Right atrial size was mild to moderately dilated.   5. S/p MV repair. Mean gradient through the valve is 3 mm Hg. . The  mitral valve has been repaired/replaced. Trivial mitral valve  regurgitation.   6. The aortic valve is tricuspid. Aortic valve regurgitation is mild.  Aortic valve sclerosis/calcification is present, without any evidence of  aortic stenosis.   7. Aortic dilatation noted. There is mild dilatation of the aortic root,  measuring 43 mm. There is borderline dilatation of the ascending aorta,  measuring 38 mm.  Past Medical History:  Diagnosis Date   Bacteremia 110/2017   Cancer [facility])    renal mass at preop   Chronic kidney disease    CVA (cerebral infarction)    hemmorhagic  CVA   Depression    Dyslipidemia    Endocarditis    MV Vegitation   GERD (gastroesophageal reflux disease)    History of kidney stones    Hypertension    Mitral regurgitation    MRSA infection    2011   Myocardial infarction [facility])    Stroke [facility])     Past Surgical History:  Procedure Laterality Date   CARDIAC  CATHETERIZATION     CARDIOVERSION N/A 09/14/2020   Procedure: CARDIOVERSION;  Surgeon: Elouise Munroe, MD;  Location: [facility] ENDOSCOPY;  Service: Cardiovascular;  Laterality: N/A;   MITRAL VALVE REPAIR     NO PAST SURGERIES     RIGHT/LEFT HEART CATH AND CORONARY ANGIOGRAPHY N/A 05/11/2020   Procedure: RIGHT/LEFT HEART CATH AND CORONARY ANGIOGRAPHY;  Surgeon: Sherren Mocha, MD;  Location: [place] CV LAB;  Service: Cardiovascular;  Laterality: N/A;   ROBOT ASSISTED LAPAROSCOPIC NEPHRECTOMY Right 02/22/2016   Procedure: XI ROBOTIC ASSISTED LAPAROSCOPIC NEPHRECTOMY;  Surgeon: Alexis Frock, MD;  Location: [facility];  Service: Urology;  Laterality: Right;   ROBOTIC ASSITED PARTIAL NEPHRECTOMY Left  06/07/2021   Procedure: XI ROBOTIC ASSITED PARTIAL NEPHRECTOMY;  Surgeon: Alexis Frock, MD;  Location: [facility];  Service: Urology;  Laterality: Left;  3 HRS   TEE WITHOUT CARDIOVERSION N/A 01/28/2020   Procedure: TRANSESOPHAGEAL ECHOCARDIOGRAM (TEE);  Surgeon: Fay Records, MD;  Location: [facility] ENDOSCOPY;  Service: Cardiovascular;  Laterality: N/A;   TONSILLECTOMY      MEDICATIONS:  Probiotic Product (PROBIOTIC BLEND PO)   acetaminophen (TYLENOL) 500 MG tablet   amoxicillin (AMOXIL) 500 MG capsule   buPROPion (WELLBUTRIN XL) 150 MG 24 hr tablet   diphenhydramine-acetaminophen (TYLENOL PM) 25-500 MG TABS tablet   famotidine (PEPCID) 20 MG tablet   finasteride (PROSCAR) 5 MG tablet   losartan (COZAAR) 50 MG tablet   OVER THE COUNTER MEDICATION   oxybutynin (DITROPAN) 5 MG tablet   pantoprazole (PROTONIX) 40 MG tablet   rosuvastatin (CRESTOR) 20 MG tablet   tadalafil (CIALIS) 20 MG tablet   tamsulosin (FLOMAX) 0.4 MG CAPS capsule   No current facility-administered medications for this encounter.    Konrad Felix Ward, PA-C [facility] [phone]

## 2022-03-27 MED ORDER — GENTAMICIN SULFATE 40 MG/ML IJ SOLN
5.0000 mg/kg | INTRAVENOUS | Status: AC
  Administered 2022-03-28: 420 mg via INTRAVENOUS
  Filled 2022-03-27 (×2): qty 10.5

## 2022-03-27 NOTE — Anesthesia Preprocedure Evaluation (Addendum)
Anesthesia Evaluation  Patient identified by MRN, date of birth, ID band Patient awake    Reviewed: Allergy & Precautions, NPO status , Patient's Chart, lab work & pertinent test results  Airway Mallampati: I  TM Distance: >3 FB Neck ROM: Full    Dental  (+) Dental Advisory Given   Pulmonary neg pulmonary ROS   breath sounds clear to auscultation       Cardiovascular hypertension, Pt. on medications (-) CAD + Valvular Problems/Murmurs (s/p MV repair)  Rhythm:Regular Rate:Normal  '23 ECHO: EF 55%, normal LVF, normal RVF, s/p MV repair '22 cath: normal coronaries   Neuro/Psych    Depression    CVA, No Residual Symptoms    GI/Hepatic ,GERD  Medicated and Controlled,,(+)     substance abuse  marijuana use  Endo/Other  negative endocrine ROS    Renal/GU Renal InsufficiencyRenal diseaseH/o stones     Musculoskeletal   Abdominal   Peds  Hematology negative hematology ROS (+)   Anesthesia Other Findings   Reproductive/Obstetrics                              Anesthesia Physical Anesthesia Plan  ASA: 3  Anesthesia Plan: General   Post-op Pain Management: Tylenol PO (pre-op)*   Induction: Intravenous  PONV Risk Score and Plan: 2 and Ondansetron and Dexamethasone  Airway Management Planned: Oral ETT  Additional Equipment: None  Intra-op Plan:   Post-operative Plan: Extubation in OR  Informed Consent: I have reviewed the patients History and Physical, chart, labs and discussed the procedure including the risks, benefits and alternatives for the proposed anesthesia with the patient or authorized representative who has indicated his/her understanding and acceptance.     Dental advisory given  Plan Discussed with: CRNA and Surgeon  Anesthesia Plan Comments:          Anesthesia Quick Evaluation

## 2022-03-28 ENCOUNTER — Observation Stay (HOSPITAL_COMMUNITY)
Admission: RE | Admit: 2022-03-28 | Discharge: 2022-03-29 | Disposition: A | Discharge status: Home / Self Care | Payer: Medicare HMO | Attending: Urology | Admitting: Urology

## 2022-03-28 ENCOUNTER — Encounter (HOSPITAL_COMMUNITY)
Admission: RE | Disposition: A | Discharge status: Home / Self Care | Payer: Self-pay | Source: Home / Self Care | Attending: Urology

## 2022-03-28 ENCOUNTER — Ambulatory Visit (HOSPITAL_BASED_OUTPATIENT_CLINIC_OR_DEPARTMENT_OTHER): Payer: Medicare HMO

## 2022-03-28 ENCOUNTER — Encounter (HOSPITAL_COMMUNITY): Payer: Self-pay | Admitting: Urology

## 2022-03-28 ENCOUNTER — Other Ambulatory Visit: Payer: Self-pay

## 2022-03-28 ENCOUNTER — Ambulatory Visit (HOSPITAL_COMMUNITY): Payer: Medicare HMO | Admitting: Physician Assistant

## 2022-03-28 DIAGNOSIS — N401 Enlarged prostate with lower urinary tract symptoms: Secondary | ICD-10-CM | POA: Diagnosis not present

## 2022-03-28 DIAGNOSIS — I129 Hypertensive chronic kidney disease with stage 1 through stage 4 chronic kidney disease, or unspecified chronic kidney disease: Secondary | ICD-10-CM | POA: Diagnosis not present

## 2022-03-28 DIAGNOSIS — R3912 Poor urinary stream: Secondary | ICD-10-CM | POA: Diagnosis not present

## 2022-03-28 DIAGNOSIS — R350 Frequency of micturition: Secondary | ICD-10-CM

## 2022-03-28 DIAGNOSIS — N189 Chronic kidney disease, unspecified: Secondary | ICD-10-CM | POA: Insufficient documentation

## 2022-03-28 DIAGNOSIS — Z85528 Personal history of other malignant neoplasm of kidney: Secondary | ICD-10-CM | POA: Insufficient documentation

## 2022-03-28 DIAGNOSIS — I2510 Atherosclerotic heart disease of native coronary artery without angina pectoris: Secondary | ICD-10-CM

## 2022-03-28 DIAGNOSIS — I10 Essential (primary) hypertension: Secondary | ICD-10-CM | POA: Diagnosis not present

## 2022-03-28 DIAGNOSIS — N400 Enlarged prostate without lower urinary tract symptoms: Secondary | ICD-10-CM | POA: Diagnosis present

## 2022-03-28 DIAGNOSIS — Z01818 Encounter for other preprocedural examination: Secondary | ICD-10-CM

## 2022-03-28 DIAGNOSIS — R3915 Urgency of urination: Secondary | ICD-10-CM | POA: Diagnosis not present

## 2022-03-28 DIAGNOSIS — Z79899 Other long term (current) drug therapy: Secondary | ICD-10-CM | POA: Insufficient documentation

## 2022-03-28 DIAGNOSIS — Z8673 Personal history of transient ischemic attack (TIA), and cerebral infarction without residual deficits: Secondary | ICD-10-CM | POA: Insufficient documentation

## 2022-03-28 HISTORY — PX: TRANSURETHRAL RESECTION OF PROSTATE: SHX73

## 2022-03-28 LAB — SURGICAL PCR SCREEN
MRSA, PCR: NEGATIVE
Staphylococcus aureus: NEGATIVE

## 2022-03-28 LAB — HEMOGLOBIN AND HEMATOCRIT, BLOOD
HCT: 38.9 % — ABNORMAL LOW (ref 39.0–52.0)
Hemoglobin: 12.6 g/dL — ABNORMAL LOW (ref 13.0–17.0)

## 2022-03-28 SURGERY — TURP (TRANSURETHRAL RESECTION OF PROSTATE)
Anesthesia: General

## 2022-03-28 MED ORDER — SODIUM CHLORIDE 0.9 % IR SOLN
3000.0000 mL | Status: DC
  Administered 2022-03-28: 3000 mL

## 2022-03-28 MED ORDER — PHENYLEPHRINE 80 MCG/ML (10ML) SYRINGE FOR IV PUSH (FOR BLOOD PRESSURE SUPPORT)
PREFILLED_SYRINGE | INTRAVENOUS | Status: AC
  Filled 2022-03-28: qty 10

## 2022-03-28 MED ORDER — MEPERIDINE HCL 50 MG/ML IJ SOLN: 6.2500 mg | INTRAMUSCULAR | Status: DC | PRN

## 2022-03-28 MED ORDER — MIDAZOLAM HCL 2 MG/2ML IJ SOLN: 0.5000 mg | Freq: Once | INTRAMUSCULAR | Status: DC | PRN

## 2022-03-28 MED ORDER — EPHEDRINE 5 MG/ML INJ
INTRAVENOUS | Status: AC
  Filled 2022-03-28: qty 5

## 2022-03-28 MED ORDER — MIDAZOLAM HCL 2 MG/2ML IJ SOLN
INTRAMUSCULAR | Status: AC
  Filled 2022-03-28: qty 2

## 2022-03-28 MED ORDER — OXYCODONE HCL 5 MG PO TABS
ORAL_TABLET | ORAL | Status: AC
  Filled 2022-03-28: qty 1

## 2022-03-28 MED ORDER — LOSARTAN POTASSIUM 50 MG PO TABS
50.0000 mg | ORAL_TABLET | Freq: Every day | ORAL | Status: DC
  Administered 2022-03-28 – 2022-03-29 (×2): 50 mg via ORAL
  Filled 2022-03-28 (×2): qty 1

## 2022-03-28 MED ORDER — OXYCODONE HCL 5 MG PO TABS: 5.0000 mg | ORAL_TABLET | ORAL | Status: DC | PRN

## 2022-03-28 MED ORDER — FENTANYL CITRATE (PF) 100 MCG/2ML IJ SOLN
INTRAMUSCULAR | Status: DC | PRN
  Administered 2022-03-28 (×2): 25 ug via INTRAVENOUS
  Administered 2022-03-28: 50 ug via INTRAVENOUS

## 2022-03-28 MED ORDER — FAMOTIDINE 20 MG PO TABS: 20.0000 mg | ORAL_TABLET | Freq: Every day | ORAL | Status: DC | PRN

## 2022-03-28 MED ORDER — HYDROMORPHONE HCL 1 MG/ML IJ SOLN: 0.2500 mg | INTRAMUSCULAR | Status: DC | PRN

## 2022-03-28 MED ORDER — MIDAZOLAM HCL 5 MG/5ML IJ SOLN
INTRAMUSCULAR | Status: DC | PRN
  Administered 2022-03-28: 2 mg via INTRAVENOUS

## 2022-03-28 MED ORDER — OXYBUTYNIN CHLORIDE 5 MG PO TABS
5.0000 mg | ORAL_TABLET | Freq: Every day | ORAL | Status: DC
  Administered 2022-03-28: 5 mg via ORAL
  Filled 2022-03-28: qty 1

## 2022-03-28 MED ORDER — ROSUVASTATIN CALCIUM 20 MG PO TABS
20.0000 mg | ORAL_TABLET | Freq: Every day | ORAL | Status: DC
  Administered 2022-03-29: 20 mg via ORAL
  Filled 2022-03-28: qty 1

## 2022-03-28 MED ORDER — SODIUM CHLORIDE 0.9 % IV SOLN: INTRAVENOUS | Status: DC

## 2022-03-28 MED ORDER — ROCURONIUM BROMIDE 10 MG/ML (PF) SYRINGE
PREFILLED_SYRINGE | INTRAVENOUS | Status: AC
  Filled 2022-03-28: qty 10

## 2022-03-28 MED ORDER — LACTATED RINGERS IV SOLN: INTRAVENOUS | Status: DC

## 2022-03-28 MED ORDER — OXYCODONE HCL 5 MG PO TABS
5.0000 mg | ORAL_TABLET | Freq: Once | ORAL | Status: AC | PRN
  Administered 2022-03-28: 5 mg via ORAL

## 2022-03-28 MED ORDER — LIDOCAINE 2% (20 MG/ML) 5 ML SYRINGE
INTRAMUSCULAR | Status: DC | PRN
  Administered 2022-03-28: 40 mg via INTRAVENOUS

## 2022-03-28 MED ORDER — PROPOFOL 10 MG/ML IV BOLUS
INTRAVENOUS | Status: DC | PRN
  Administered 2022-03-28: 200 mg via INTRAVENOUS

## 2022-03-28 MED ORDER — BUPROPION HCL ER (XL) 150 MG PO TB24
150.0000 mg | ORAL_TABLET | Freq: Every day | ORAL | Status: DC
  Administered 2022-03-29: 150 mg via ORAL
  Filled 2022-03-28: qty 1

## 2022-03-28 MED ORDER — ACETAMINOPHEN 500 MG PO TABS
1000.0000 mg | ORAL_TABLET | Freq: Three times a day (TID) | ORAL | Status: AC
  Administered 2022-03-28 – 2022-03-29 (×3): 1000 mg via ORAL
  Filled 2022-03-28 (×3): qty 2

## 2022-03-28 MED ORDER — SENNOSIDES-DOCUSATE SODIUM 8.6-50 MG PO TABS
1.0000 | ORAL_TABLET | Freq: Two times a day (BID) | ORAL | Status: DC
  Administered 2022-03-28 – 2022-03-29 (×3): 1 via ORAL
  Filled 2022-03-28 (×3): qty 1

## 2022-03-28 MED ORDER — SODIUM CHLORIDE 0.9 % IR SOLN
Status: DC | PRN
  Administered 2022-03-28: 12000 mL via INTRAVESICAL

## 2022-03-28 MED ORDER — OXYCODONE HCL 5 MG/5ML PO SOLN: 5.0000 mg | Freq: Once | ORAL | Status: AC | PRN

## 2022-03-28 MED ORDER — LIDOCAINE HCL (PF) 2 % IJ SOLN
INTRAMUSCULAR | Status: AC
  Filled 2022-03-28: qty 5

## 2022-03-28 MED ORDER — PHENYLEPHRINE 80 MCG/ML (10ML) SYRINGE FOR IV PUSH (FOR BLOOD PRESSURE SUPPORT)
PREFILLED_SYRINGE | INTRAVENOUS | Status: DC | PRN
  Administered 2022-03-28 (×4): 80 ug via INTRAVENOUS

## 2022-03-28 MED ORDER — PROMETHAZINE HCL 25 MG/ML IJ SOLN: 6.2500 mg | INTRAMUSCULAR | Status: DC | PRN

## 2022-03-28 MED ORDER — DEXAMETHASONE SODIUM PHOSPHATE 10 MG/ML IJ SOLN
INTRAMUSCULAR | Status: DC | PRN
  Administered 2022-03-28: 8 mg via INTRAVENOUS

## 2022-03-28 MED ORDER — DEXAMETHASONE SODIUM PHOSPHATE 10 MG/ML IJ SOLN
INTRAMUSCULAR | Status: AC
  Filled 2022-03-28: qty 1

## 2022-03-28 MED ORDER — HYDROMORPHONE HCL 1 MG/ML IJ SOLN
0.5000 mg | INTRAMUSCULAR | Status: DC | PRN
  Administered 2022-03-28: 0.5 mg via INTRAVENOUS
  Filled 2022-03-28: qty 1

## 2022-03-28 MED ORDER — ONDANSETRON HCL 4 MG/2ML IJ SOLN
INTRAMUSCULAR | Status: DC | PRN
  Administered 2022-03-28: 4 mg via INTRAVENOUS

## 2022-03-28 MED ORDER — PROPOFOL 10 MG/ML IV BOLUS
INTRAVENOUS | Status: AC
  Filled 2022-03-28: qty 20

## 2022-03-28 MED ORDER — ACETAMINOPHEN 10 MG/ML IV SOLN
INTRAVENOUS | Status: AC
  Filled 2022-03-28: qty 100

## 2022-03-28 MED ORDER — PHENYLEPHRINE HCL-NACL 20-0.9 MG/250ML-% IV SOLN
INTRAVENOUS | Status: DC | PRN
  Administered 2022-03-28: 25 ug/min via INTRAVENOUS

## 2022-03-28 MED ORDER — PANTOPRAZOLE SODIUM 40 MG PO TBEC: 40.0000 mg | DELAYED_RELEASE_TABLET | Freq: Every day | ORAL | Status: DC | PRN

## 2022-03-28 MED ORDER — FINASTERIDE 5 MG PO TABS
5.0000 mg | ORAL_TABLET | Freq: Every day | ORAL | Status: DC
  Administered 2022-03-28 – 2022-03-29 (×2): 5 mg via ORAL
  Filled 2022-03-28 (×2): qty 1

## 2022-03-28 MED ORDER — FENTANYL CITRATE (PF) 100 MCG/2ML IJ SOLN
INTRAMUSCULAR | Status: AC
  Filled 2022-03-28: qty 2

## 2022-03-28 MED ORDER — CHLORHEXIDINE GLUCONATE 0.12 % MT SOLN
15.0000 mL | Freq: Once | OROMUCOSAL | Status: AC
  Administered 2022-03-28: 15 mL via OROMUCOSAL

## 2022-03-28 MED ORDER — ACETAMINOPHEN 10 MG/ML IV SOLN
INTRAVENOUS | Status: DC | PRN
  Administered 2022-03-28: 1000 mg via INTRAVENOUS

## 2022-03-28 MED ORDER — ORAL CARE MOUTH RINSE: 15.0000 mL | Freq: Once | OROMUCOSAL | Status: AC

## 2022-03-28 MED ORDER — ONDANSETRON HCL 4 MG/2ML IJ SOLN
INTRAMUSCULAR | Status: AC
  Filled 2022-03-28: qty 2

## 2022-03-28 MED ORDER — EPHEDRINE SULFATE-NACL 50-0.9 MG/10ML-% IV SOSY
PREFILLED_SYRINGE | INTRAVENOUS | Status: DC | PRN
  Administered 2022-03-28: 5 mg via INTRAVENOUS

## 2022-03-28 SURGICAL SUPPLY — 22 items
BAG DRN RND TRDRP ANRFLXCHMBR (UROLOGICAL SUPPLIES) ×1
BAG URINE DRAIN 2000ML AR STRL (UROLOGICAL SUPPLIES) ×1 IMPLANT
BAG URO CATCHER STRL LF (MISCELLANEOUS) ×1 IMPLANT
CATH FOLEY 3WAY 30CC 24FR (CATHETERS) ×1
CATH URTH STD 24FR FL 3W 2 (CATHETERS) IMPLANT
DRAPE FOOT SWITCH (DRAPES) ×1 IMPLANT
ELECT REM PT RETURN 15FT ADLT (MISCELLANEOUS) IMPLANT
GLOVE SURG LX STRL 7.5 STRW (GLOVE) ×1 IMPLANT
GOWN SRG XL LVL 4 BRTHBL STRL (GOWNS) ×1 IMPLANT
GOWN STRL NON-REIN XL LVL4 (GOWNS) ×2
GUIDEWIRE STR DUAL SENSOR (WIRE) IMPLANT
HOLDER FOLEY CATH W/STRAP (MISCELLANEOUS) IMPLANT
IV CATH 14GX2 1/4 (CATHETERS) IMPLANT
KIT TURNOVER KIT A (KITS) IMPLANT
LOOP CUT BIPOLAR 24F LRG (ELECTROSURGICAL) IMPLANT
MANIFOLD NEPTUNE II (INSTRUMENTS) ×1 IMPLANT
PACK CYSTO (CUSTOM PROCEDURE TRAY) ×1 IMPLANT
SYR 30ML LL (SYRINGE) ×1 IMPLANT
SYR TOOMEY IRRIG 70ML (MISCELLANEOUS) ×1
SYRINGE TOOMEY IRRIG 70ML (MISCELLANEOUS) ×1 IMPLANT
TUBING CONNECTING 10 (TUBING) ×1 IMPLANT
TUBING UROLOGY SET (TUBING) ×1 IMPLANT

## 2022-03-28 NOTE — Anesthesia Postprocedure Evaluation (Signed)
Anesthesia Post Note  Patient: [name]  Procedure(s) Performed: TRANSURETHRAL RESECTION OF THE PROSTATE (TURP)     Patient location during evaluation: PACU Anesthesia Type: General Level of consciousness: awake and alert, patient cooperative and oriented Pain management: pain level controlled Vital Signs Assessment: post-procedure vital signs reviewed and stable Respiratory status: spontaneous breathing, nonlabored ventilation and respiratory function stable Cardiovascular status: blood pressure returned to baseline and stable Postop Assessment: no apparent nausea or vomiting Anesthetic complications: no   No notable events documented.  Last Vitals:  Vitals:   03/28/22 0945 03/28/22 1000  BP: (!) 119/56 124/72  Pulse: 82 73  Resp: 16 12  Temp:  36.8 C  SpO2: 100% 97%    Last Pain:  Vitals:   03/28/22 1000  TempSrc:   PainSc: 0-No pain                 Alvar Malinoski,E. Kandise Riehle

## 2022-03-28 NOTE — Op Note (Signed)
NAMERODMAN, GRONLUND MEDICAL RECORD NO: [id number] ACCOUNT NO: [id number] DATE OF BIRTH: 30-Jun-1955 FACILITY: [facility] LOCATION: [facility] PHYSICIAN: Alexis Frock, MD  Operative Report   DATE OF PROCEDURE: 03/28/2022  DIAGNOSIS:  Prostatic hypertrophy with refractory lower urinary tract symptoms.  PROCEDURE:  Transurethral resection of the prostate.  ESTIMATED BLOOD LOSS:  100 mL.  COMPLICATIONS:  None.  SPECIMEN:  Prostate chips for permanent pathology.  FINDINGS: 1.  Bilobar prostatic hypertrophy with kissing lobes pre-resection. 2.  Wide open prostate fossa from the bladder neck to verumontanum post-resection.  DRAINS:  A 24-French 3-way Foley catheter to normal saline irrigation, 30 water in the balloon.  INDICATIONS:  [name] is a very pleasant 67 year old man with history of multifocal renal cell carcinoma, status post partial nephrectomy and contralateral radical nephrectomy.  He has done very well following this oncologically and functionally and is very  compliant with followup.  He also has progressive obstructive voiding symptoms with urgency, frequency, weak stream.  He has been on medical therapy with alpha blockers and 5 alpha reductase inhibitors for number of years and even an anticholinergics most recently,  however, symptoms have remained significantly bothersome.  He had urodynamics previously, which showed some contractility compromise.  However, obstructive picture with high pressure low flow state.  He was considering an outlet procedure.  His prostate  volume is estimated to be 48 grams based on prior imaging.  Cystoscopy revealed no strictures.  Therefore, with the size range, I counseled him towards transurethral resection of prostate to being most definitive and he wished to proceed.  Informed  consent was obtained and placed in medical record.  PROCEDURE IN DETAIL:  The patient being [name] verified and the procedure being transurethral prostate was confirmed.   Procedure timeout was performed.  Intravenous antibiotics were administered.  General LMA anesthesia was induced.  The patient  was placed into a low lithotomy position.  Sterile field was created, prepped and draped the patient's penis, perineum, and proximal thighs using iodine.  Cystourethroscopy then was performed using 21-French rigid cystoscope with offset lens. The distal  pendulous urethra was somewhat tight, but without frank stricturing.  Inspection of the prostate fossa did reveal some bilobar hypertrophy with kissing lobes.  Bladder was mildly trabeculated.  Next urethral calibration was performed from 20-French to  30-French in 2-French increments using male sounds and the 26-French resectoscope sheath was introduced with visual obturator.  Attention was directed at transposition of the prostate using a bipolar loop and saline irrigation was performed in a top down  fashion, first at 12 o'clock position from the bladder neck to the verumontanum down to superficial fibromuscular stroma of the prostate capsule, then of the right lobe of the prostate from the 12 o'clock to 6 o'clock position taking exquisite care to  avoid undermining of the bladder neck and then the left lobe similarly 12 o'clock solution containing exquisite care to avoid undermining the bladder neck.  This resulted in excellent wide open fossa from the area of the bladder neck to the verumontanum.   This generated mini prostate chips and were then irrigated and set aside for permanent pathology.  Bladder was carefully inspected and all chips were removed.  Next additional  hemostasis was performed using coagulation current to the entire prostate fossa in  a descending spiral fashion to minimize trauma to the area of the bladder neck.  A sensor wire was placed in the level of the urinary bladder and a 24-French 3-way Foley catheter was placed  over this using a Council type technique, 30 mL water placed in  the balloon.  This was  placed on gentle catheter strap traction and normal saline irrigation at a rate of approximately 2 drops per second with efflux being very light pink.  Procedure was terminated.  The patient tolerated the procedure well, no  immediate perioperative complications.  The patient taken to postanesthesia care unit in stable condition.  Plan for observation admission.   MUK D: 03/28/2022 9:34:06 am T: 03/28/2022 10:34:00 am  JOB: [id number]

## 2022-03-28 NOTE — Discharge Instructions (Signed)
1 - You may have urinary urgency (bladder spasms) and bloody urine on / off with catheter in place. This is normal. ° °2 - Call MD or go to ER for fever >102, severe pain / nausea / vomiting not relieved by medications, or acute change in medical status ° °

## 2022-03-28 NOTE — Anesthesia Procedure Notes (Signed)
Procedure Name: LMA Insertion Date/Time: 03/28/2022 8:40 AM  Performed by: Victoriano Lain, CRNAPre-anesthesia Checklist: Patient identified, Emergency Drugs available, Suction available, Patient being monitored and Timeout performed Patient Re-evaluated:Patient Re-evaluated prior to induction Oxygen Delivery Method: Circle system utilized Preoxygenation: Pre-oxygenation with 100% oxygen Induction Type: IV induction Ventilation: Mask ventilation without difficulty LMA: LMA with gastric port inserted LMA Size: 4.0 Number of attempts: 1 Placement Confirmation: positive ETCO2 and breath sounds checked- equal and bilateral Tube secured with: Tape Dental Injury: Teeth and Oropharynx as per pre-operative assessment

## 2022-03-28 NOTE — Transfer of Care (Signed)
Immediate Anesthesia Transfer of Care Note  Patient: [name]  Procedure(s) Performed: TRANSURETHRAL RESECTION OF THE PROSTATE (TURP)  Patient Location: PACU  Anesthesia Type:General  Level of Consciousness: awake, alert , oriented, and patient cooperative  Airway & Oxygen Therapy: Patient Spontanous Breathing and Patient connected to face mask oxygen  Post-op Assessment: Report given to RN, Post -op Vital signs reviewed and stable, and Patient moving all extremities  Post vital signs: Reviewed and stable  Last Vitals:  Vitals Value Taken Time  BP 109/65 03/28/22 0938  Temp    Pulse 84 03/28/22 0941  Resp 15 03/28/22 0941  SpO2 100 % 03/28/22 0941  Vitals shown include unvalidated device data.  Last Pain:  Vitals:   03/28/22 0708  TempSrc:   PainSc: 0-No pain         Complications: No notable events documented.

## 2022-03-28 NOTE — Brief Op Note (Signed)
03/28/2022  9:28 AM  PATIENT:  [name]  67 y.o. male  PRE-OPERATIVE DIAGNOSIS:  ENLARGED PROSTATE  POST-OPERATIVE DIAGNOSIS:  * No post-op diagnosis entered *  PROCEDURE:  Procedure(s): TRANSURETHRAL RESECTION OF THE PROSTATE (TURP) (N/A)  SURGEON:  Surgeon(s) and Role:    * Alexis Frock, MD - Primary  PHYSICIAN ASSISTANT:   ASSISTANTS: none   ANESTHESIA:   general  EBL:  100 mL   BLOOD ADMINISTERED:none  DRAINS:  38f 3 way foley to NS irrigation    LOCAL MEDICATIONS USED:  NONE  SPECIMEN:  Source of Specimen:  prostate chips  DISPOSITION OF SPECIMEN:  PATHOLOGY  COUNTS:  YES  TOURNIQUET:  * No tourniquets in log *  DICTATION: .Other Dictation: Dictation Number *[id number]  PLAN OF CARE: Admit for overnight observation  PATIENT DISPOSITION:  PACU - hemodynamically stable.   Delay start of Pharmacological VTE agent (>24hrs) due to surgical blood loss or risk of bleeding: yes

## 2022-03-28 NOTE — H&P (Signed)
[name] is an 67 y.o. male.    Chief Complaint: Pre-OP Transurethral Resection of Prostate  HPI:   1 - Bilateral Renal Masses - s/p right radical nephrectomy 02/2016 for 5cm T1bNxMx Grade 2 clear cell cancer with negative margins. s/p left robotic partial nephrectomy 06/2021 for pT1aNxMx grade 3 clear cell cancer with negative margins. Had genetics eval 2023 which fortunatley showed no heritable mutations.   Post-op Surveillance:  12/2021 - CMP, CT - Cr 1.4, small area nodularity near partial bed, favor reactive (did have negative margins).   2 - Prostate Screening - All on Finasteride  2021 - PSA 1.01 / DRE 50gm smooth  2022 PSA 1.12 / DRE 50gm smooth  2023 PSA 2.2 / DRE 50gm smooth   3 - Lower Urinary Tract Symptoms / Enlarged Prostate / Hypocontractile Bladder - followed with Tannenbaum for years. Prior microwave years ago. UDS previously hypocontractile with PDet 40 at Qm 9. PVR's 100-300 range x many. No manages with finasteride daily. Adding Tamsulosin 2022. Prostate vol 39mL (normal for age) by CT 2022. Adding nightly oxybutynin 01/2021. Cysto 03/2022 with kissing lobes, no strictures, no median.   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 NO h/o DM or severe HTN. Cr now 1.7s or less.   PMH sig for MI/CVA 2010 (follows Dorris Carnes Cards yearly, not limiting whatsoever), Bacterial Meningitis 2017 (no deficits, gram positives), No prior chest / abd surgeries. No strong blood thinners. His PCP is Marcellus Scott MD.   Today " [name]" is seen to proceed with transurethral reseciton of prostate for med-refracotry lower urinary tract symptoms.     Past Medical History:  Diagnosis Date   Bacteremia 110/2017   Cancer [facility])    renal mass at preop   Chronic kidney disease    CVA (cerebral infarction)    hemmorhagic  CVA   Depression    Dyslipidemia    Endocarditis    MV Vegitation   GERD (gastroesophageal reflux disease)    History of  kidney stones    Hypertension    Mitral regurgitation    MRSA infection    2011   Myocardial infarction [facility])    Stroke [facility])     Past Surgical History:  Procedure Laterality Date   CARDIAC CATHETERIZATION     CARDIOVERSION N/A 09/14/2020   Procedure: CARDIOVERSION;  Surgeon: Elouise Munroe, MD;  Location: [place];  Service: Cardiovascular;  Laterality: N/A;   MITRAL VALVE REPAIR     NO PAST SURGERIES     RIGHT/LEFT HEART CATH AND CORONARY ANGIOGRAPHY N/A 05/11/2020   Procedure: RIGHT/LEFT HEART CATH AND CORONARY ANGIOGRAPHY;  Surgeon: Sherren Mocha, MD;  Location: [place] CV LAB;  Service: Cardiovascular;  Laterality: N/A;   ROBOT ASSISTED LAPAROSCOPIC NEPHRECTOMY Right 02/22/2016   Procedure: XI ROBOTIC ASSISTED LAPAROSCOPIC NEPHRECTOMY;  Surgeon: Alexis Frock, MD;  Location: [facility];  Service: Urology;  Laterality: Right;   ROBOTIC ASSITED PARTIAL NEPHRECTOMY Left 06/07/2021   Procedure: XI ROBOTIC ASSITED PARTIAL NEPHRECTOMY;  Surgeon: Alexis Frock, MD;  Location: [facility];  Service: Urology;  Laterality: Left;  3 HRS   TEE WITHOUT CARDIOVERSION N/A 01/28/2020   Procedure: TRANSESOPHAGEAL ECHOCARDIOGRAM (TEE);  Surgeon: Fay Records, MD;  Location: [facility] ENDOSCOPY;  Service: Cardiovascular;  Laterality: N/A;   TONSILLECTOMY      Family History  Problem Relation Age of Onset   Heart disease Mother        mom RF  as a child (died of heart problem in  27 )   Social History:  reports that he has never smoked. He has never used smokeless tobacco. He reports current alcohol use of about 4.0 - 5.0 standard drinks of alcohol per week. He reports current drug use. Drug: Marijuana.  Allergies: No Known Allergies  Medications Prior to Admission  Medication Sig Dispense Refill   acetaminophen (TYLENOL) 500 MG tablet Take 1,000 mg by mouth every 6 (six) hours as needed for moderate pain.     amoxicillin (AMOXIL) 500 MG capsule Take 4 capsules (2,000 mg total) by mouth as  directed. 4 pills one hour before dental appointment 4 capsule 2   buPROPion (WELLBUTRIN XL) 150 MG 24 hr tablet Take 150 mg by mouth daily.     diphenhydramine-acetaminophen (TYLENOL PM) 25-500 MG TABS tablet Take 2 tablets by mouth at bedtime.     finasteride (PROSCAR) 5 MG tablet Take 1 tablet (5 mg total) by mouth daily. 30 tablet 0   losartan (COZAAR) 50 MG tablet Take 50 mg by mouth daily.     OVER THE COUNTER MEDICATION Take 1-3 capsules by mouth See admin instructions. CBD gummies, Take 3 gummies at bedtime, may take 1 gummy in the middle of the night as needed for sleep     oxybutynin (DITROPAN) 5 MG tablet Take 5 mg by mouth at bedtime.     pantoprazole (PROTONIX) 40 MG tablet Take 40 mg by mouth daily as needed (acid reflux).     rosuvastatin (CRESTOR) 20 MG tablet Take 1 tablet (20 mg total) by mouth daily. 90 tablet 3   tadalafil (CIALIS) 20 MG tablet Take 20 mg by mouth daily as needed for erectile dysfunction.     tamsulosin (FLOMAX) 0.4 MG CAPS capsule Take 0.4 mg by mouth daily.     famotidine (PEPCID) 20 MG tablet Take 20 mg by mouth daily as needed for heartburn or indigestion.     Probiotic Product (PROBIOTIC BLEND PO) Take 1 tablet by mouth at bedtime.      No results found for this or any previous visit (from the past 48 hour(s)). No results found.  Review of Systems  Constitutional:  Negative for chills and fever.  Genitourinary:  Positive for urgency.  All other systems reviewed and are negative.   Blood pressure (!) 141/89, pulse 98, temperature 99.1 F (37.3 C), temperature source Oral, resp. rate 16, height 5\' 8"  (1.727 m), weight 84.1 kg, SpO2 97 %. Physical Exam Vitals reviewed.  Constitutional:      Comments: Pleasant with dry humor, at baseline.   HENT:     Head: Normocephalic.  Eyes:     Pupils: Pupils are equal, round, and reactive to light.  Cardiovascular:     Rate and Rhythm: Normal rate.  Abdominal:     General: Abdomen is flat.     Comments:  Prior scars w/o hernias.   Genitourinary:    Comments: No CVAT at present Musculoskeletal:        General: Normal range of motion.     Cervical back: Normal range of motion.  Skin:    General: Skin is warm.  Neurological:     Mental Status: He is alert.  Psychiatric:        Mood and Affect: Mood normal.      Assessment/Plan  Proceed as planned with TURP. Risks, benefits, alternatives, expected peri-op course discussed previously and reiterated today.   Alexis Frock, MD 03/28/2022, 8:02 AM

## 2022-03-29 ENCOUNTER — Encounter (HOSPITAL_COMMUNITY): Payer: Self-pay | Admitting: Urology

## 2022-03-29 DIAGNOSIS — N401 Enlarged prostate with lower urinary tract symptoms: Secondary | ICD-10-CM | POA: Diagnosis not present

## 2022-03-29 DIAGNOSIS — Z85528 Personal history of other malignant neoplasm of kidney: Secondary | ICD-10-CM | POA: Diagnosis not present

## 2022-03-29 DIAGNOSIS — N400 Enlarged prostate without lower urinary tract symptoms: Secondary | ICD-10-CM | POA: Diagnosis present

## 2022-03-29 DIAGNOSIS — Z79899 Other long term (current) drug therapy: Secondary | ICD-10-CM | POA: Diagnosis not present

## 2022-03-29 DIAGNOSIS — Z8673 Personal history of transient ischemic attack (TIA), and cerebral infarction without residual deficits: Secondary | ICD-10-CM | POA: Diagnosis not present

## 2022-03-29 DIAGNOSIS — N189 Chronic kidney disease, unspecified: Secondary | ICD-10-CM | POA: Diagnosis not present

## 2022-03-29 DIAGNOSIS — I129 Hypertensive chronic kidney disease with stage 1 through stage 4 chronic kidney disease, or unspecified chronic kidney disease: Secondary | ICD-10-CM | POA: Diagnosis not present

## 2022-03-29 LAB — BASIC METABOLIC PANEL
Anion gap: 8 (ref 5–15)
BUN: 35 mg/dL — ABNORMAL HIGH (ref 8–23)
CO2: 23 mmol/L (ref 22–32)
Calcium: 8.3 mg/dL — ABNORMAL LOW (ref 8.9–10.3)
Chloride: 105 mmol/L (ref 98–111)
Creatinine, Ser: 2.06 mg/dL — ABNORMAL HIGH (ref 0.61–1.24)
GFR, Estimated: 35 mL/min — ABNORMAL LOW (ref >60)
Glucose, Bld: 115 mg/dL — ABNORMAL HIGH (ref 70–99)
Potassium: 4.8 mmol/L (ref 3.5–5.1)
Sodium: 136 mmol/L (ref 135–145)

## 2022-03-29 LAB — SURGICAL PATHOLOGY

## 2022-03-29 LAB — HEMOGLOBIN AND HEMATOCRIT, BLOOD
HCT: 40.7 % (ref 39.0–52.0)
Hemoglobin: 13.1 g/dL (ref 13.0–17.0)

## 2022-03-29 MED ORDER — SENNOSIDES-DOCUSATE SODIUM 8.6-50 MG PO TABS: 1.0000 | ORAL_TABLET | Freq: Two times a day (BID) | ORAL | 0 refills | Status: DC

## 2022-03-29 MED ORDER — TRAMADOL HCL 50 MG PO TABS: 50.0000 mg | ORAL_TABLET | Freq: Four times a day (QID) | ORAL | 0 refills | Status: DC | PRN

## 2022-03-29 MED ORDER — AMOXICILLIN-POT CLAVULANATE 500-125 MG PO TABS: 1.0000 | ORAL_TABLET | Freq: Two times a day (BID) | ORAL | 0 refills | Status: DC

## 2022-03-29 NOTE — Progress Notes (Signed)
  Transition of Care [facility]) Screening Note   Patient Details  Name: [name] Date of Birth: 04-Mar-1955   Transition of Care [facility]) CM/SW Contact:    Dessa Phi, RN Phone Number: 03/29/2022, 11:33 AM    Transition of Care Department [facility]) has reviewed patient and no TOC needs have been identified at this time. We will continue to monitor patient advancement through interdisciplinary progression rounds. If new patient transition needs arise, please place a TOC consult.

## 2022-03-29 NOTE — Plan of Care (Signed)
  Problem: Education: Goal: Knowledge of the prescribed therapeutic regimen will improve Outcome: Progressing   Problem: Bowel/Gastric: Goal: Gastrointestinal status for postoperative course will improve Outcome: Progressing   Problem: Health Behavior/Discharge Planning: Goal: Identification of resources available to assist in meeting health care needs will improve Outcome: Progressing   Problem: Education: Goal: Knowledge of General Education information will improve Description: Including pain rating scale, medication(s)/side effects and non-pharmacologic comfort measures Outcome: Progressing

## 2022-03-29 NOTE — Discharge Summary (Signed)
Physician Discharge Summary  Patient ID: [name] MRN: [id number] DOB/AGE: [birth year] 67 y.o.  Admit date: 03/28/2022 Discharge date: 03/29/2022  Admission Diagnoses: Prostatic Hypertrophy  Discharge Diagnoses:  Principal Problem:   Prostatic hyperplasia Active Problems:   Prostate hypertrophy   Discharged Condition: good  Hospital Course:  Pt underwent transurethral resection of prostate for med-refracotry lower urinary tract symptoms on 03/28/22. He was observed overnight post-opeartivley. He was initially on conitnuous bladder irrigaiotn which was weaned to off by the AM of POD 1. By the late AM of POD 1, the day of discharge, he is ambulatory, pain controlled on PO meds, maintaining PO nutrition, and felt to be adequate for discahrge. Hgb 13, Cr 2 (baselien), path pending at discharge.   Consults: None  Significant Diagnostic Studies: labs: as per above  Treatments: surgery: as per above  Discharge Exam: Blood pressure (!) 101/59, pulse 90, temperature 97.8 F (36.6 C), temperature source Oral, resp. rate 18, height 5\' 8"  (1.727 m), weight 84.1 kg, SpO2 98 %.  NAD, AOx3, pleasant Non-labored breathing on room air SNTND, prior scars w/o hernias Foley in place off irrigaiton with light pink urine and scant debris, no clots. Some dried blood around catheter as expected.  SCD's in place, no c/c/e   Disposition: Discharge disposition: 01-Home or Self Care          Follow-up Information     Alexis Frock, MD Follow up on 04/02/2022.   Specialty: Urology Why: at 10 AM for MD visit and office catheter removal. Contact information: [place] [address] [phone]                 Signed: Alexis Frock 03/29/2022, 10:45 AM

## 2022-06-06 DIAGNOSIS — I129 Hypertensive chronic kidney disease with stage 1 through stage 4 chronic kidney disease, or unspecified chronic kidney disease: Secondary | ICD-10-CM | POA: Diagnosis not present

## 2022-06-06 DIAGNOSIS — Z905 Acquired absence of kidney: Secondary | ICD-10-CM | POA: Diagnosis not present

## 2022-06-06 DIAGNOSIS — D631 Anemia in chronic kidney disease: Secondary | ICD-10-CM | POA: Diagnosis not present

## 2022-06-06 DIAGNOSIS — N1831 Chronic kidney disease, stage 3a: Secondary | ICD-10-CM | POA: Diagnosis not present

## 2022-06-07 LAB — LAB REPORT - SCANNED
Creatinine, POC: 139.2 mg/dL
EGFR: 33

## 2022-06-12 ENCOUNTER — Encounter: Payer: Self-pay | Admitting: Nephrology

## 2022-06-29 DIAGNOSIS — N1831 Chronic kidney disease, stage 3a: Secondary | ICD-10-CM | POA: Diagnosis not present

## 2022-07-09 DIAGNOSIS — C642 Malignant neoplasm of left kidney, except renal pelvis: Secondary | ICD-10-CM | POA: Diagnosis not present

## 2022-07-09 DIAGNOSIS — Z905 Acquired absence of kidney: Secondary | ICD-10-CM | POA: Diagnosis not present

## 2022-07-09 DIAGNOSIS — C641 Malignant neoplasm of right kidney, except renal pelvis: Secondary | ICD-10-CM | POA: Diagnosis not present

## 2022-07-09 DIAGNOSIS — R3915 Urgency of urination: Secondary | ICD-10-CM | POA: Diagnosis not present

## 2022-07-09 DIAGNOSIS — Z125 Encounter for screening for malignant neoplasm of prostate: Secondary | ICD-10-CM | POA: Diagnosis not present

## 2022-07-10 DIAGNOSIS — I129 Hypertensive chronic kidney disease with stage 1 through stage 4 chronic kidney disease, or unspecified chronic kidney disease: Secondary | ICD-10-CM | POA: Diagnosis not present

## 2022-07-10 DIAGNOSIS — N1832 Chronic kidney disease, stage 3b: Secondary | ICD-10-CM | POA: Diagnosis not present

## 2022-07-10 DIAGNOSIS — I2510 Atherosclerotic heart disease of native coronary artery without angina pectoris: Secondary | ICD-10-CM | POA: Diagnosis not present

## 2022-07-10 DIAGNOSIS — K219 Gastro-esophageal reflux disease without esophagitis: Secondary | ICD-10-CM | POA: Diagnosis not present

## 2022-07-10 DIAGNOSIS — E785 Hyperlipidemia, unspecified: Secondary | ICD-10-CM | POA: Diagnosis not present

## 2022-07-10 DIAGNOSIS — F325 Major depressive disorder, single episode, in full remission: Secondary | ICD-10-CM | POA: Diagnosis not present

## 2022-07-10 DIAGNOSIS — Z952 Presence of prosthetic heart valve: Secondary | ICD-10-CM | POA: Diagnosis not present

## 2022-07-10 DIAGNOSIS — Z823 Family history of stroke: Secondary | ICD-10-CM | POA: Diagnosis not present

## 2022-07-10 DIAGNOSIS — Z8249 Family history of ischemic heart disease and other diseases of the circulatory system: Secondary | ICD-10-CM | POA: Diagnosis not present

## 2022-07-10 DIAGNOSIS — Z8673 Personal history of transient ischemic attack (TIA), and cerebral infarction without residual deficits: Secondary | ICD-10-CM | POA: Diagnosis not present

## 2022-07-10 DIAGNOSIS — R32 Unspecified urinary incontinence: Secondary | ICD-10-CM | POA: Diagnosis not present

## 2022-07-10 DIAGNOSIS — C649 Malignant neoplasm of unspecified kidney, except renal pelvis: Secondary | ICD-10-CM | POA: Diagnosis not present

## 2022-07-11 ENCOUNTER — Other Ambulatory Visit: Payer: Self-pay | Admitting: Internal Medicine

## 2022-08-12 NOTE — Progress Notes (Unsigned)
Cardiology Office Note   Date:  08/14/2022   ID:  [name], DOB 08/25/1955, MRN [id number]  PCP:  Marden Noble, MD (Inactive)  Cardiologist:   Dietrich Pates, MD    F/U of mitral valve dz       History of Present Illness: [name] is a 67 y.o. 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    I saw the pt in Sept 2023   Since seen he has done well  He remains active   Goes to gym every other day   Denies CP  No SOB   He never had palpitations    The pt has an appt with D Coladonato in a couple weeks   Outpatient Medications Prior to Visit  Medication Sig Dispense Refill   acetaminophen (TYLENOL) 500 MG tablet Take 1,000 mg by mouth every 6 (six) hours as needed for moderate pain.     amoxicillin (AMOXIL) 500 MG capsule Take 4 capsules (2,000 mg total) by mouth as directed. 4 pills one hour before dental appointment 4 capsule 2   amoxicillin-clavulanate (AUGMENTIN) 500-125 MG tablet Take 1 tablet by mouth 2 (two) times daily. To prevent post-op infection 10 tablet 0   buPROPion (WELLBUTRIN XL) 150 MG 24 hr tablet Take 150 mg by mouth daily.     diphenhydramine-acetaminophen (TYLENOL PM) 25-500  MG TABS tablet Take 2 tablets by mouth at bedtime.     losartan (COZAAR) 50 MG tablet Take 50 mg by mouth daily.     OVER THE COUNTER MEDICATION Take 1-3 capsules by mouth See admin instructions. CBD gummies, Take 3 gummies at bedtime, may take 1 gummy in the middle of the night as needed for sleep     pantoprazole (PROTONIX) 40 MG tablet Take 40 mg by mouth daily as needed (acid reflux).     Probiotic Product (PROBIOTIC BLEND PO) Take 1 tablet by mouth at bedtime.     rosuvastatin (CRESTOR) 10 MG tablet TAKE 1 TABLET BY MOUTH EVERY DAY 90 tablet 0   rosuvastatin (CRESTOR) 20 MG tablet Take 1 tablet (20 mg total) by mouth daily. 90 tablet 3   tadalafil (CIALIS) 20 MG tablet Take 20 mg by mouth daily as needed for erectile dysfunction.     famotidine (PEPCID) 20 MG tablet Take 20 mg by mouth daily as needed for heartburn or indigestion.     senna-docusate (SENOKOT-S) 8.6-50 MG tablet Take 1 tablet by mouth 2 (two) times daily. While taking strong pain meds to prevent constipation.  10 tablet 0   traMADol (ULTRAM) 50 MG tablet Take 1-2 tablets (50-100 mg total) by mouth every 6 (six) hours as needed for moderate pain or severe pain (post-operatively). 20 tablet 0   No facility-administered medications prior to visit.     Allergies:   Patient has no known allergies.   Past Medical History:  Diagnosis Date   Bacteremia 110/2017   Cancer [facility])    renal mass at preop   Chronic kidney disease    CVA (cerebral infarction)    hemmorhagic  CVA   Depression    Dyslipidemia    Endocarditis    MV Vegitation   GERD (gastroesophageal reflux disease)    History of kidney stones    Hypertension    Mitral regurgitation    MRSA infection    2011   Myocardial infarction [facility])    Stroke [facility])     Past Surgical History:  Procedure Laterality Date   CARDIAC CATHETERIZATION     CARDIOVERSION N/A 09/14/2020   Procedure: CARDIOVERSION;  Surgeon: Parke Poisson, MD;  Location: [facility] ENDOSCOPY;   Service: Cardiovascular;  Laterality: N/A;   MITRAL VALVE REPAIR     NO PAST SURGERIES     RIGHT/LEFT HEART CATH AND CORONARY ANGIOGRAPHY N/A 05/11/2020   Procedure: RIGHT/LEFT HEART CATH AND CORONARY ANGIOGRAPHY;  Surgeon: Tonny Bollman, MD;  Location: [facility] INVASIVE CV LAB;  Service: Cardiovascular;  Laterality: N/A;   ROBOT ASSISTED LAPAROSCOPIC NEPHRECTOMY Right 02/22/2016   Procedure: XI ROBOTIC ASSISTED LAPAROSCOPIC NEPHRECTOMY;  Surgeon: Sebastian Ache, MD;  Location: [facility] ORS;  Service: Urology;  Laterality: Right;   ROBOTIC ASSITED PARTIAL NEPHRECTOMY Left 06/07/2021   Procedure: XI ROBOTIC ASSITED PARTIAL NEPHRECTOMY;  Surgeon: Sebastian Ache, MD;  Location: [facility] ORS;  Service: Urology;  Laterality: Left;  3 HRS   TEE WITHOUT CARDIOVERSION N/A 01/28/2020   Procedure: TRANSESOPHAGEAL ECHOCARDIOGRAM (TEE);  Surgeon: Pricilla Riffle, MD;  Location: [facility] ENDOSCOPY;  Service: Cardiovascular;  Laterality: N/A;   TONSILLECTOMY     TRANSURETHRAL RESECTION OF PROSTATE N/A 03/28/2022   Procedure: TRANSURETHRAL RESECTION OF THE PROSTATE (TURP);  Surgeon: Sebastian Ache, MD;  Location: [facility] ORS;  Service: Urology;  Laterality: N/A;     Social History:  The patient  reports that he has never smoked. He has never used smokeless tobacco. He reports current alcohol use of about 4.0 - 5.0 standard drinks of alcohol per week. He reports current drug use. Drug: Marijuana.   Family History:  The patient's family history includes Heart disease in his mother.    ROS:  Please see the history of present illness. All other systems are reviewed and  Negative to the above problem except as noted.    PHYSICAL EXAM: VS:  BP 134/76   Pulse 68   Ht 5\' 8"  (1.727 m)   Wt 189 lb 6.4 oz (85.9 kg)   SpO2 99%   BMI 28.80 kg/m   GEN: Obese 67 yo in no acute distress  HEENT: normal  Neck: JVP normal; no carotid bruit Cardiac: RRR; No murmurs Respiratory:  clear to auscultation  GI: soft, nontender, nondistended, + BS    MS: no deformity Moving all extremities   Skin: warm and dry, no rash Neuro:  Strength and sensation are intact Psych: euthymic mood, full affect   EKG:  EKG shows SR   68 bpm   RBBB   Echo   05/3021  Left ventricular ejection fraction, by estimation, is 55%%. The left ventricle has normal function.  The left ventricle has no regional wall motion abnormalities. Left ventricular diastolic parameters are consistent with Grade I diastolic dysfunction (impaired relaxation). Elevated left atrial pressure. 1. 2. Right ventricular systolic function is normal. The right ventricular size is normal. 3. Left atrial size was mild to moderately dilated. 4. Right atrial size was mild to moderately dilated. S/p MV repair. Mean gradient through the valve is 3 mm Hg. . The mitral valve has been repaired/replaced. Trivial mitral valve regurgitation. 5. The aortic valve is tricuspid. Aortic valve regurgitation is mild. Aortic valve sclerosis/calcification is present, without any evidence of aortic stenosis. 6. Aortic dilatation noted. There is mild dilatation of the aortic root, measuring 43 mm. There is borderline dilatation of the ascending aorta, measuring 38 mm. 7. Comparison(s): EF 55%, mild AI, AOR 40mm, MVP with mod-severe MR. L heart cath5/22  Conclusion  1.  Widely patent coronary arteries with minimal irregularity, right dominant 2.  Normal/low intracardiac filling pressures with no evidence of pulmonary hypertension 3.  Preserved cardiac output Lipid Panel    Component Value Date/Time   CHOL 151 12/13/2017 1147   TRIG 114 12/13/2017 1147   HDL 53 12/13/2017 1147   CHOLHDL 2.8 12/13/2017 1147   CHOLHDL 4 01/19/2010 0904   VLDL 22.4 01/19/2010 0904   LDLCALC 75 12/13/2017 1147   LDLDIRECT 164.1 01/19/2010 0904      Wt Readings from Last 3 Encounters:  08/14/22 189 lb 6.4 oz (85.9 kg)  03/28/22 185 lb 6.5 oz (84.1 kg)  03/19/22 185 lb 6.4 oz (84.1 kg)      ASSESSMENT AND  PLAN:  1.  Mitral valve disease. Pt s/p repair [facility]) in 2022   Excellent result.   No murmur   Follow   2 PAF  Pt had afib post surgery   Cardioverted   Treated with amiodarone for short term. Off meds  Remains asymptomatic, though he says he never had them     3.  Hypertension. BP is a little elevated    I told pt to keep track of BP   Keep log   Take in with cuff to visit with Dr Arrie Aran  4.  Dyslipidemia. LDL 76  HDL 42 Trig 190  (March 2024)   Watch diet  Keep on meds   5  Aortic root dilation  43 mm on recent echo   Repeat echo this winter    6.  Renal.  Follows with Drs Arrie Aran and Berneice Heinrich   Toldhim to take cuff with him to appt   F/U in clinic in 9 months   Current medicines are reviewed at length with the patient today.  The patient does not have concerns regarding medicines.  Signed, Dietrich Pates, MD  08/14/2022 5:00 PM    [facility] [address] [place], [place], [place]  [address] Phone: [phone]; Fax: [phone]

## 2022-08-14 ENCOUNTER — Ambulatory Visit: Payer: Medicare HMO | Attending: Internal Medicine | Admitting: Internal Medicine

## 2022-08-14 ENCOUNTER — Encounter: Payer: Self-pay | Admitting: Internal Medicine

## 2022-08-14 VITALS — BP 134/76 | HR 68 | Ht 68.0 in | Wt 189.4 lb

## 2022-08-14 DIAGNOSIS — I341 Nonrheumatic mitral (valve) prolapse: Secondary | ICD-10-CM

## 2022-08-14 DIAGNOSIS — I340 Nonrheumatic mitral (valve) insufficiency: Secondary | ICD-10-CM

## 2022-08-14 DIAGNOSIS — I480 Paroxysmal atrial fibrillation: Secondary | ICD-10-CM

## 2022-08-14 NOTE — Patient Instructions (Signed)
Medication Instructions:  No changes today *If you need a refill on your cardiac medications before your next appointment, please call your pharmacy*   Lab Work: none   Testing/Procedures:  ECHO DUE IN 6 MONTHS Your physician has requested that you have an echocardiogram. Echocardiography is a painless test that uses sound waves to create images of your heart. It provides your doctor with information about the size and shape of your heart and how well your heart's chambers and valves are working. This procedure takes approximately one hour. There are no restrictions for this procedure. Please do NOT wear cologne, perfume, aftershave, or lotions (deodorant is allowed). Please arrive 15 minutes prior to your appointment time.   Follow-Up: At [facility], you and your health needs are our priority.  As part of our continuing mission to provide you with exceptional heart care, we have created designated Provider Care Teams.  These Care Teams include your primary Cardiologist (physician) and Advanced Practice Providers (APPs -  Physician Assistants and Nurse Practitioners) who all work together to provide you with the care you need, when you need it.   Your next appointment:   9 month(s)  Provider:   Dietrich Pates, MD

## 2022-08-23 ENCOUNTER — Encounter: Payer: Self-pay | Admitting: Internal Medicine

## 2022-08-23 DIAGNOSIS — N400 Enlarged prostate without lower urinary tract symptoms: Secondary | ICD-10-CM | POA: Diagnosis not present

## 2022-08-23 DIAGNOSIS — M6289 Other specified disorders of muscle: Secondary | ICD-10-CM | POA: Diagnosis not present

## 2022-08-23 DIAGNOSIS — I10 Essential (primary) hypertension: Secondary | ICD-10-CM | POA: Diagnosis not present

## 2022-08-23 DIAGNOSIS — Z79899 Other long term (current) drug therapy: Secondary | ICD-10-CM | POA: Diagnosis not present

## 2022-08-23 DIAGNOSIS — I69319 Unspecified symptoms and signs involving cognitive functions following cerebral infarction: Secondary | ICD-10-CM | POA: Diagnosis not present

## 2022-08-23 DIAGNOSIS — N529 Male erectile dysfunction, unspecified: Secondary | ICD-10-CM | POA: Diagnosis not present

## 2022-08-23 DIAGNOSIS — N1831 Chronic kidney disease, stage 3a: Secondary | ICD-10-CM | POA: Diagnosis not present

## 2022-08-23 DIAGNOSIS — Z23 Encounter for immunization: Secondary | ICD-10-CM | POA: Diagnosis not present

## 2022-08-23 DIAGNOSIS — D6869 Other thrombophilia: Secondary | ICD-10-CM | POA: Diagnosis not present

## 2022-08-23 DIAGNOSIS — F4320 Adjustment disorder, unspecified: Secondary | ICD-10-CM | POA: Diagnosis not present

## 2022-08-23 DIAGNOSIS — I480 Paroxysmal atrial fibrillation: Secondary | ICD-10-CM | POA: Diagnosis not present

## 2022-08-23 DIAGNOSIS — E7800 Pure hypercholesterolemia, unspecified: Secondary | ICD-10-CM | POA: Diagnosis not present

## 2022-08-23 DIAGNOSIS — Z0000 Encounter for general adult medical examination without abnormal findings: Secondary | ICD-10-CM | POA: Diagnosis not present

## 2022-08-31 ENCOUNTER — Other Ambulatory Visit: Payer: Self-pay | Admitting: Internal Medicine

## 2022-08-31 DIAGNOSIS — M6289 Other specified disorders of muscle: Secondary | ICD-10-CM

## 2022-08-31 IMAGING — CR DG CHEST 2V
2 series · 2 of 2 positions shown · non-contrast
Comparison: 09/16/2019

CLINICAL DATA: 65-year-old male with a history of renal neoplasm

EXAM:
CHEST - 2 VIEW

[w chest pa]
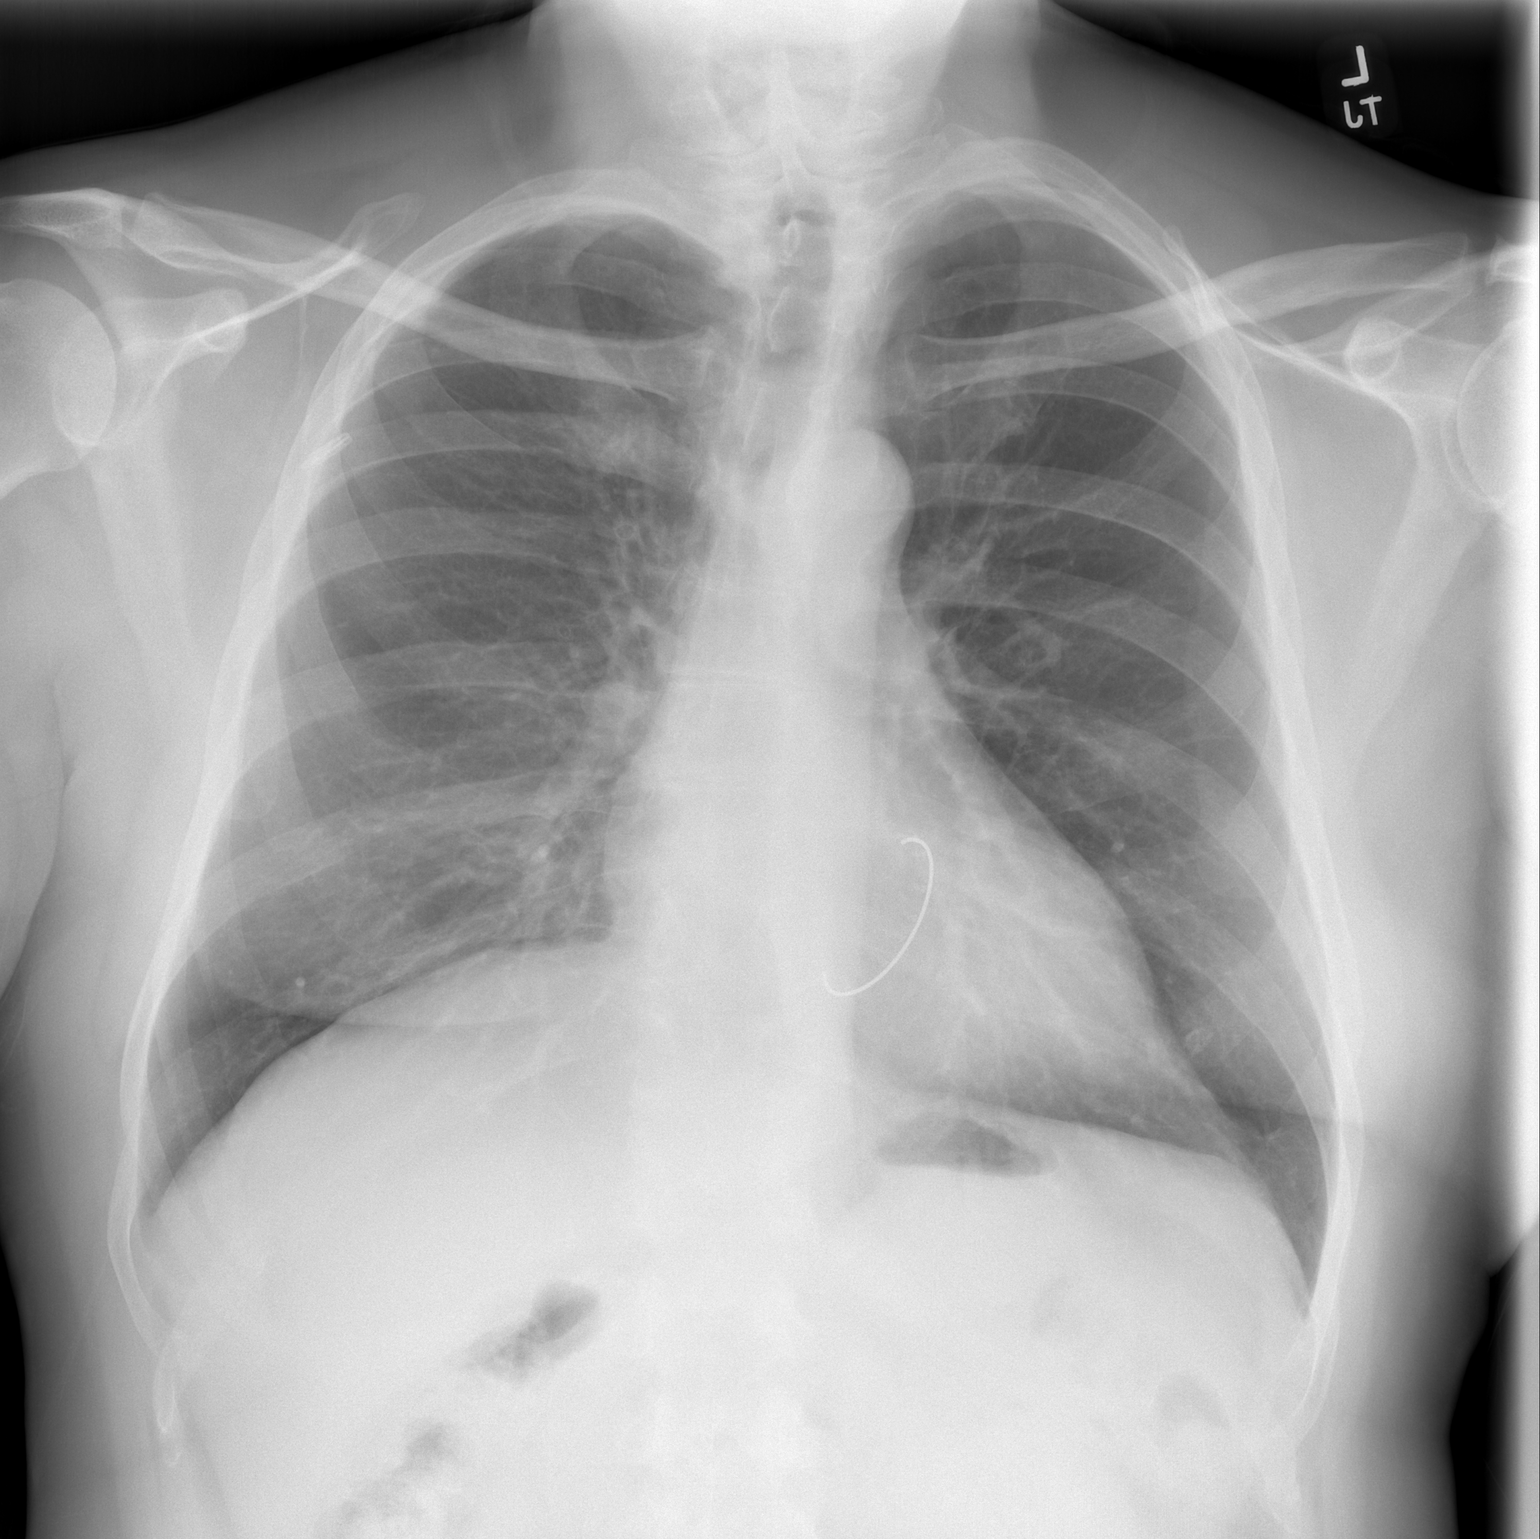

[w chest lat]
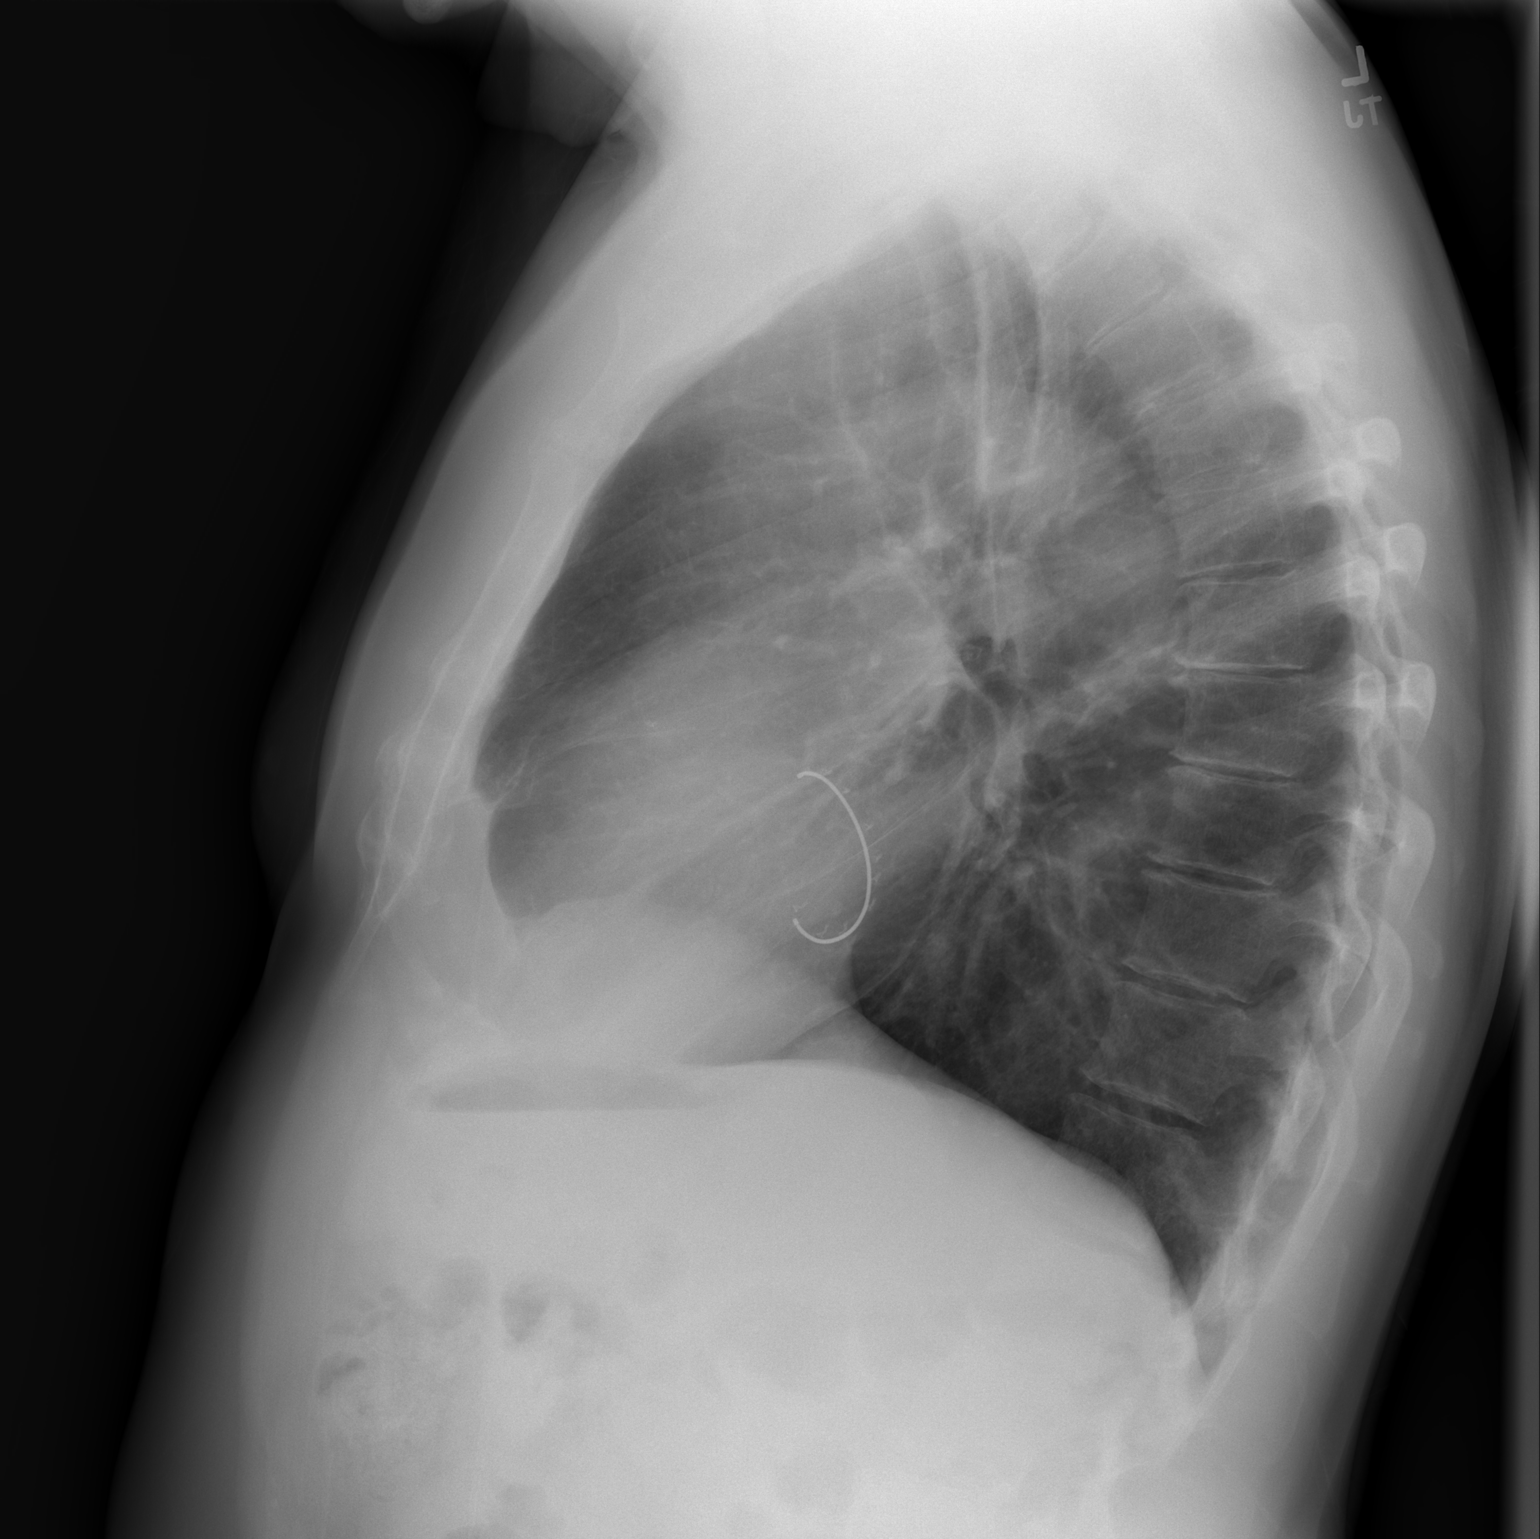

[2 of 2 positions shown; findings below may reference images not displayed]

FINDINGS: Cardiomediastinal silhouette unchanged in size and contour. No
evidence of central vascular congestion. No interlobular septal
thickening.

Interval surgical changes of mini thoracotomy and mitral valve
annuloplasty

No pneumothorax or pleural effusion. Coarsened interstitial
markings, with no confluent airspace disease.

No acute displaced fracture. Degenerative changes of the spine.
IMPRESSION: No active cardiopulmonary disease.

## 2022-09-04 ENCOUNTER — Encounter: Payer: Self-pay | Admitting: Internal Medicine

## 2022-09-05 ENCOUNTER — Ambulatory Visit: Payer: Medicare HMO | Admitting: Internal Medicine

## 2022-09-13 ENCOUNTER — Other Ambulatory Visit: Payer: Medicare HMO

## 2022-09-19 ENCOUNTER — Ambulatory Visit
Admission: RE | Admit: 2022-09-19 | Discharge: 2022-09-19 | Disposition: A | Discharge status: Home / Self Care | Payer: Medicare HMO | Source: Ambulatory Visit | Attending: Internal Medicine | Admitting: Internal Medicine

## 2022-09-19 DIAGNOSIS — M6289 Other specified disorders of muscle: Secondary | ICD-10-CM

## 2022-10-04 DIAGNOSIS — R69 Illness, unspecified: Secondary | ICD-10-CM | POA: Diagnosis not present

## 2022-10-09 ENCOUNTER — Other Ambulatory Visit: Payer: Self-pay | Admitting: Internal Medicine

## 2022-10-11 DIAGNOSIS — N189 Chronic kidney disease, unspecified: Secondary | ICD-10-CM | POA: Diagnosis not present

## 2022-10-11 DIAGNOSIS — Z905 Acquired absence of kidney: Secondary | ICD-10-CM | POA: Diagnosis not present

## 2022-10-11 DIAGNOSIS — N1831 Chronic kidney disease, stage 3a: Secondary | ICD-10-CM | POA: Diagnosis not present

## 2022-10-11 DIAGNOSIS — I129 Hypertensive chronic kidney disease with stage 1 through stage 4 chronic kidney disease, or unspecified chronic kidney disease: Secondary | ICD-10-CM | POA: Diagnosis not present

## 2022-10-11 DIAGNOSIS — D631 Anemia in chronic kidney disease: Secondary | ICD-10-CM | POA: Diagnosis not present

## 2022-10-12 LAB — LAB REPORT - SCANNED: EGFR: 37

## 2022-10-18 DIAGNOSIS — H2513 Age-related nuclear cataract, bilateral: Secondary | ICD-10-CM | POA: Diagnosis not present

## 2022-10-18 DIAGNOSIS — H18513 Endothelial corneal dystrophy, bilateral: Secondary | ICD-10-CM | POA: Diagnosis not present

## 2022-10-18 NOTE — Telephone Encounter (Signed)
OV 10/30/22

## 2022-10-25 ENCOUNTER — Other Ambulatory Visit: Payer: Self-pay | Admitting: Internal Medicine

## 2022-10-29 NOTE — Progress Notes (Unsigned)
Cardiology Office Note   Date:  10/30/2022   ID:  [name], DOB Oct 13, 1955, MRN [id number]  PCP:  Marden Noble, MD (Inactive)  Cardiologist:   Dietrich Pates, MD    F/U of mitral valve dz       History of Present Illness: [name] is a 67 y.o. 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     I saw the pt in Aug 2024  Since seen he says his breathing is OK  No CP   No palpitations     He does say that his legs feel weaker (thighs)  Follwed by Dr Orson Aloe in medicine clinic    Says he has some testing done   No falls He stopped Crestor and has noticed no real difference    Outpatient Medications Prior to Visit  Medication Sig Dispense Refill   acetaminophen (TYLENOL) 500 MG tablet Take 1,000 mg by mouth every 6 (six) hours as needed for moderate pain.     amoxicillin (AMOXIL) 500 MG capsule Take 4 capsules (2,000 mg total) by mouth as directed. 4 pills one hour before dental appointment 4 capsule 2   amoxicillin-clavulanate (AUGMENTIN) 500-125 MG tablet Take 1 tablet by mouth 2 (two) times daily. To prevent post-op infection 10 tablet 0   buPROPion (WELLBUTRIN XL) 150 MG 24 hr tablet Take 150  mg by mouth daily.     diphenhydramine-acetaminophen (TYLENOL PM) 25-500 MG TABS tablet Take 2 tablets by mouth at bedtime.     OVER THE COUNTER MEDICATION Take 1-3 capsules by mouth See admin instructions. CBD gummies, Take 3 gummies at bedtime, may take 1 gummy in the middle of the night as needed for sleep     pantoprazole (PROTONIX) 40 MG tablet Take 40 mg by mouth daily as needed (acid reflux).     tadalafil (CIALIS) 20 MG tablet Take 20 mg by mouth daily as needed for erectile dysfunction.     losartan (COZAAR) 50 MG tablet Take 50 mg by mouth daily.     Probiotic Product (PROBIOTIC BLEND PO) Take 1 tablet by mouth at bedtime.     rosuvastatin (CRESTOR) 10 MG tablet TAKE 1 TABLET BY MOUTH EVERY DAY (Patient not taking: Reported on 10/30/2022) 90 tablet 0   rosuvastatin (CRESTOR) 20 MG tablet TAKE 1 TABLET BY MOUTH EVERY DAY 90 tablet 3   No facility-administered medications prior to visit.     Allergies:   Patient has no known allergies.   Past Medical History:  Diagnosis Date  Bacteremia 110/2017   Cancer ([facility])    renal mass at preop   Chronic kidney disease    CVA (cerebral infarction)    hemmorhagic  CVA   Depression    Dyslipidemia    Endocarditis    MV Vegitation   GERD (gastroesophageal reflux disease)    History of kidney stones    Hypertension    Mitral regurgitation    MRSA infection    2011   Myocardial infarction [facility])    Stroke [facility])     Past Surgical History:  Procedure Laterality Date   CARDIAC CATHETERIZATION     CARDIOVERSION N/A 09/14/2020   Procedure: CARDIOVERSION;  Surgeon: Parke Poisson, MD;  Location: [facility] ENDOSCOPY;  Service: Cardiovascular;  Laterality: N/A;   MITRAL VALVE REPAIR     NO PAST SURGERIES     RIGHT/LEFT HEART CATH AND CORONARY ANGIOGRAPHY N/A 05/11/2020   Procedure: RIGHT/LEFT HEART CATH AND CORONARY ANGIOGRAPHY;  Surgeon: Tonny Bollman, MD;  Location: [facility] INVASIVE CV LAB;  Service: Cardiovascular;  Laterality: N/A;    ROBOT ASSISTED LAPAROSCOPIC NEPHRECTOMY Right 02/22/2016   Procedure: XI ROBOTIC ASSISTED LAPAROSCOPIC NEPHRECTOMY;  Surgeon: Sebastian Ache, MD;  Location[facility]S;  Service: Urology;  Laterality: Right;   ROBOTIC ASSITED PARTIAL NEPHRECTOMY Left 06/07/2021   Procedure: XI ROBOTIC ASSITED PARTIAL NEPHRECTOMY;  Surgeon: Sebastian Ache, MD;  Location: [facility] ORS;  Service: Urology;  Laterality: Left;  3 HRS   TEE WITHOUT CARDIOVERSION N/A 01/28/2020   Procedure: TRANSESOPHAGEAL ECHOCARDIOGRAM (TEE);  Surgeon: Pricilla Riffle, MD;  Location: [facility] ENDOSCOPY;  Service: Cardiovascular;  Laterality: N/A;   TONSILLECTOMY     TRANSURETHRAL RESECTION OF PROSTATE N/A 03/28/2022   Procedure: TRANSURETHRAL RESECTION OF THE PROSTATE (TURP);  Surgeon: Sebastian Ache, MD;  Location: [facility] ORS;  Service: Urology;  Laterality: N/A;     Social History:  The patient  reports that he has never smoked. He has never used smokeless tobacco. He reports current alcohol use of about 4.0 - 5.0 standard drinks of alcohol per week. He reports current drug use. Drug: Marijuana.   Family History:  The patient's family history includes Heart disease in his mother.    ROS:  Please see the history of present illness. All other systems are reviewed and  Negative to the above problem except as noted.    PHYSICAL EXAM: VS:  BP 132/72   Pulse 78   Ht 5\' 8"  (1.727 m)   Wt 186 lb (84.4 kg)   SpO2 99%   BMI 28.28 kg/m   GEN: Obese 67 yo in no acute distress  HEENT: normal  Neck: JVP normal; no carotid bruits Cardiac: RRR; No murmur Respiratory:  clear to auscultation  GI: soft, nontender, nondistended, No hepatomegaly    EKG:  EKG not done   Echo   05/3021  Left ventricular ejection fraction, by estimation, is 55%%. The left ventricle has normal function. The left ventricle has no regional wall motion abnormalities. Left ventricular diastolic parameters are consistent with Grade I diastolic dysfunction (impaired relaxation).  Elevated left atrial pressure. 1. 2. Right ventricular systolic function is normal. The right ventricular size is normal. 3. Left atrial size was mild to moderately dilated. 4. Right atrial size was mild to moderately dilated. S/p MV repair. Mean gradient through the valve is 3 mm Hg. . The mitral valve has been repaired/replaced. Trivial mitral valve regurgitation. 5. The aortic valve is tricuspid. Aortic valve regurgitation is mild. Aortic valve sclerosis/calcification is present, without any evidence  of aortic stenosis. 6. Aortic dilatation noted. There is mild dilatation of the aortic root, measuring 43 mm. There is borderline dilatation of the ascending aorta, measuring 38 mm. 7. Comparison(s): EF 55%, mild AI, AOR 40mm, MVP with mod-severe MR. L heart cath5/22  Conclusion  1.  Widely patent coronary arteries with minimal irregularity, right dominant 2.  Normal/low intracardiac filling pressures with no evidence of pulmonary hypertension 3.  Preserved cardiac output Lipid Panel    Component Value Date/Time   CHOL 151 12/13/2017 1147   TRIG 114 12/13/2017 1147   HDL 53 12/13/2017 1147   CHOLHDL 2.8 12/13/2017 1147   CHOLHDL 4 01/19/2010 0904   VLDL 22.4 01/19/2010 0904   LDLCALC 75 12/13/2017 1147   LDLDIRECT 164.1 01/19/2010 0904      Wt Readings from Last 3 Encounters:  10/30/22 186 lb (84.4 kg)  08/14/22 189 lb 6.4 oz (85.9 kg)  03/28/22 185 lb 6.5 oz (84.1 kg)      ASSESSMENT AND PLAN:  1.  Mitral valve disease. Pt s/p repair [facility]) in 2022   Excellent result.   No murmur  COntinue to follow     2 PAF  Pt only had afib post valve surgery  Rx amiodarone transiently  Cardioverted   Now off of meds   No recurrence   3.  Hypertension. BP is fair   He says better when he was in renal clinic    Will get records   4   Aortic dilitation   Seen on echo  43 mm   Will repeat echo to reasses  5  Weakness   PT notes some thigh weakness  On my exam I do not see that  he is weak  Will get records from Dr Orson Aloe   6  HL   Pt came off of Crestor due to weakness DId not notice a difference  He has not restarted   Will review medicine records before resuming   7.  Renal.  Follows with Drs Arrie Aran and Manny    F/U in clinic next spring/summer  Current medicines are reviewed at length with the patient today.  The patient does not have concerns regarding medicines.  Signed, Dietrich Pates, MD  10/30/2022 4:05 PM    [facility] [address] [place], [place], [place]  [address] Phone: [phone]; Fax: [phone]

## 2022-10-30 ENCOUNTER — Encounter: Payer: Self-pay | Admitting: Internal Medicine

## 2022-10-30 ENCOUNTER — Ambulatory Visit: Payer: Medicare HMO | Attending: Internal Medicine | Admitting: Internal Medicine

## 2022-10-30 VITALS — BP 132/72 | HR 78 | Ht 68.0 in | Wt 186.0 lb

## 2022-10-30 DIAGNOSIS — R531 Weakness: Secondary | ICD-10-CM

## 2022-10-30 MED ORDER — LOSARTAN POTASSIUM 50 MG PO TABS: 50.0000 mg | ORAL_TABLET | Freq: Every day | ORAL | 3 refills | Status: DC

## 2022-10-30 NOTE — Patient Instructions (Signed)
Medication Instructions:   *If you need a refill on your cardiac medications before your next appointment, please call your pharmacy*   Lab Work:  If you have labs (blood work) drawn today and your tests are completely normal, you will receive your results only by: MyChart Message (if you have MyChart) OR A paper copy in the mail If you have any lab test that is abnormal or we need to change your treatment, we will call you to review the results.   Testing/Procedures: Your physician has requested that you have an echocardiogram. Echocardiography is a painless test that uses sound waves to create images of your heart. It provides your doctor with information about the size and shape of your heart and how well your heart's chambers and valves are working. This procedure takes approximately one hour. There are no restrictions for this procedure. Please do NOT wear cologne, perfume, aftershave, or lotions (deodorant is allowed). Please arrive 15 minutes prior to your appointment time.    Follow-Up: At [facility], you and your health needs are our priority.  As part of our continuing mission to provide you with exceptional heart care, we have created designated Provider Care Teams.  These Care Teams include your primary Cardiologist (physician) and Advanced Practice Providers (APPs -  Physician Assistants and Nurse Practitioners) who all work together to provide you with the care you need, when you need it.  We recommend signing up for the patient portal called "MyChart".  Sign up information is provided on this After Visit Summary.  MyChart is used to connect with patients for Virtual Visits (Telemedicine).  Patients are able to view lab/test results, encounter notes, upcoming appointments, etc.  Non-urgent messages can be sent to your provider as well.   To learn more about what you can do with MyChart, go to [email or web address].    Your next appointment: 9 months

## 2022-11-06 ENCOUNTER — Telehealth: Payer: Self-pay | Admitting: Internal Medicine

## 2022-11-06 DIAGNOSIS — R69 Illness, unspecified: Secondary | ICD-10-CM | POA: Diagnosis not present

## 2022-11-06 NOTE — Telephone Encounter (Signed)
When I saw pt in clinic he complained of weakness in legs I did not observe this on exam Please let him know: It does appear that he is going through additional testing with Dr Orson Aloe For now I would keep off of statin (he did not notice a difference with stopping though) Will follw up in the spring    If etiology not found and still symptomatic would refer to neuro.

## 2022-11-08 NOTE — Telephone Encounter (Signed)
Message sent to the pts My Chart for his review.

## 2022-11-27 ENCOUNTER — Ambulatory Visit (HOSPITAL_COMMUNITY): Payer: Medicare HMO | Attending: Cardiovascular Disease

## 2022-11-27 DIAGNOSIS — I517 Cardiomegaly: Secondary | ICD-10-CM | POA: Diagnosis not present

## 2022-11-27 DIAGNOSIS — I10 Essential (primary) hypertension: Secondary | ICD-10-CM | POA: Insufficient documentation

## 2022-11-27 DIAGNOSIS — I4891 Unspecified atrial fibrillation: Secondary | ICD-10-CM | POA: Insufficient documentation

## 2022-11-27 DIAGNOSIS — R531 Weakness: Secondary | ICD-10-CM | POA: Diagnosis not present

## 2022-11-27 DIAGNOSIS — E785 Hyperlipidemia, unspecified: Secondary | ICD-10-CM | POA: Diagnosis not present

## 2022-11-27 DIAGNOSIS — I080 Rheumatic disorders of both mitral and aortic valves: Secondary | ICD-10-CM | POA: Insufficient documentation

## 2022-11-27 DIAGNOSIS — I77810 Thoracic aortic ectasia: Secondary | ICD-10-CM | POA: Diagnosis not present

## 2022-11-27 DIAGNOSIS — I252 Old myocardial infarction: Secondary | ICD-10-CM | POA: Insufficient documentation

## 2022-11-27 LAB — ECHOCARDIOGRAM COMPLETE
Area-P 1/2: 3.81 cm2
MV VTI: 1.6 cm2
P 1/2 time: 428 ms
S' Lateral: 3 cm

## 2023-01-15 ENCOUNTER — Ambulatory Visit (HOSPITAL_COMMUNITY)
Admission: RE | Admit: 2023-01-15 | Discharge: 2023-01-15 | Disposition: A | Discharge status: Home / Self Care | Payer: Medicare HMO | Source: Ambulatory Visit | Attending: Urology | Admitting: Urology

## 2023-01-15 ENCOUNTER — Other Ambulatory Visit (HOSPITAL_COMMUNITY): Payer: Self-pay | Admitting: Urology

## 2023-01-15 DIAGNOSIS — C641 Malignant neoplasm of right kidney, except renal pelvis: Secondary | ICD-10-CM | POA: Insufficient documentation

## 2023-01-15 DIAGNOSIS — C642 Malignant neoplasm of left kidney, except renal pelvis: Secondary | ICD-10-CM

## 2023-01-15 DIAGNOSIS — R918 Other nonspecific abnormal finding of lung field: Secondary | ICD-10-CM | POA: Diagnosis not present

## 2023-01-17 DIAGNOSIS — Z905 Acquired absence of kidney: Secondary | ICD-10-CM | POA: Diagnosis not present

## 2023-01-17 DIAGNOSIS — C641 Malignant neoplasm of right kidney, except renal pelvis: Secondary | ICD-10-CM | POA: Diagnosis not present

## 2023-01-17 DIAGNOSIS — C642 Malignant neoplasm of left kidney, except renal pelvis: Secondary | ICD-10-CM | POA: Diagnosis not present

## 2023-01-22 DIAGNOSIS — Z905 Acquired absence of kidney: Secondary | ICD-10-CM | POA: Diagnosis not present

## 2023-01-22 DIAGNOSIS — R911 Solitary pulmonary nodule: Secondary | ICD-10-CM | POA: Diagnosis not present

## 2023-01-22 DIAGNOSIS — R3915 Urgency of urination: Secondary | ICD-10-CM | POA: Diagnosis not present

## 2023-01-22 DIAGNOSIS — C642 Malignant neoplasm of left kidney, except renal pelvis: Secondary | ICD-10-CM | POA: Diagnosis not present

## 2023-01-31 DIAGNOSIS — M19012 Primary osteoarthritis, left shoulder: Secondary | ICD-10-CM | POA: Diagnosis not present

## 2023-01-31 DIAGNOSIS — M25532 Pain in left wrist: Secondary | ICD-10-CM | POA: Diagnosis not present

## 2023-01-31 DIAGNOSIS — M25422 Effusion, left elbow: Secondary | ICD-10-CM | POA: Diagnosis not present

## 2023-01-31 DIAGNOSIS — M25522 Pain in left elbow: Secondary | ICD-10-CM | POA: Diagnosis not present

## 2023-02-07 NOTE — Progress Notes (Signed)
 [facility] CONSULT NOTE  Patient Care Team: Pa, [facility] as PCP - General Okey Vina GAILS, MD as PCP - Cardiology (Cardiology)  ASSESSMENT & PLAN:  [name] is a 68 y.o.male with history of CVA, HTN, MI, MVR and bilateral RCC being seen at [facility] for [facility].  History of Right T1a G2 CC RCC and Left T1a G3 CC RCC presented with CT findings of 8 mm RLL nodule.   We discussed the significance of pulmonary nodule at this time point.  I recommend proceed with complete staging, with CT scan.  He also has new double vision. Recommend MRI for evaluation. See me in a few weeks after results available to go over.  Renal cell carcinoma (HCC) With 8 mm RLL nodules. Will obtain CT chest to complete staging. MRI brain  Ativan  0.5 mg sent to his pharmacy per request for before MRI.  Orders Placed This Encounter  Procedures   MR BRAIN W WO CONTRAST    Standing Status:   Future    Expected Date:   02/15/2023    Expiration Date:   02/08/2024    If indicated for the ordered procedure, I authorize the administration of contrast media per Radiology protocol:   Yes    What is the patient's sedation requirement?:   No Sedation    Does the patient have a pacemaker or implanted devices?:   No    Use SRS Protocol?:   Yes    Preferred imaging location?:   [facility] (table limit - 550 lbs)   CT Chest W Contrast    Standing Status:   Future    Expected Date:   02/15/2023    Expiration Date:   02/08/2024    If indicated for the ordered procedure, I authorize the administration of contrast media per Radiology protocol:   Yes    Does the patient have a contrast media/X-ray dye allergy?:   No    Preferred imaging location?:   [facility]   CBC with Differential (Cancer Center Only)    Standing Status:   Future    Number of Occurrences:   1    Expiration Date:   02/08/2024   CMP (Cancer Center only)    Standing Status:   Future    Number of  Occurrences:   1    Expiration Date:   02/08/2024   Lactate dehydrogenase    Standing Status:   Future    Number of Occurrences:   1    Expiration Date:   02/08/2024   All questions were answered. The patient knows to call the clinic with any problems, questions or concerns. No barriers to learning was detected.  Pauletta JAYSON Chihuahua, MD 2/7/20259:25 PM  CHIEF COMPLAINTS/PURPOSE OF CONSULTATION:  History of RCC  HISTORY OF PRESENTING ILLNESS:  [name] 68 y.o. male is here because of history of RCC.  Report double vision about a years and had seen eye doctor thought due to aging. No persistent. He residual weakness on the left arm more than leg from his CVA. He is retired clinical research associate for 30 years.   I have reviewed his chart and materials related to his cancer extensively and collaborated history with the patient. Summary of oncologic history is as follows: Oncology History  Renal cell carcinoma [facility])   Initial Diagnosis   Malignant neoplasm of renal pelvis (HCC)   02/22/2016 Pathology Results   Kidney, radical nephrectomy for tumor, right - CLEAR  CELL RENAL CELL CARCINOMA, FUHRMAN NUCLEAR GRADE 2, SPANNING 4 CM IN GREATEST DIMENSION. - TUMOR IS CONFINED TO RENAL PARENCHYMA. - MARGINS ARE NEGATIVE. - SEE ONCOLOGY TEMPLATE. Microscopic Comment KIDNEY Specimen, including laterality: Right kidney. Procedure: Right robotic radical nephrectomy. Tumor focality: Right. Maximum tumor size (cm): 4 cm. Macroscopic extent of tumor: Tumor confined to kidney. Histologic type: Clear cell renal cell carcinoma. If sarcomatoid features give percentage: No sarcomatoid features identified. Fuhrman Nuclear Grade: 2. Margins (renal vein, ureter, soft tissue): Negative. Renal vein invasion: Not identified. Microscopic tumor extension: Tumor confined to kidney. Adrenal gland: Not received. Lymph nodes: No lymph nodes received. TNM code: pT1a, pNX.   06/07/2021 Pathology Results   A. KIDNEY, LEFT, MASS,  PARTIAL NEPHRECTOMY:  Clear cell renal cell carcinoma, nuclear grade 3, sized 2.8 cm  Tumor is limited to the kidney (pT1a)  All margins of resection are negative for tumor   KIDNEY:  Procedure: Partial Nephrectomy  Specimen Laterality: Left  Tumor Location: Lower pole  Tumor Size: 2.8 cm  Histologic Type: Clear cell renal cell carcinoma  Sarcomatoid Features: Negative  Rhabdoid Features: Negative  Histologic Grade (WHO/ISUP grade): G3  Tumor Necrosis: Negative  Tumor Extension: Organ confined  Margins: Negative  Regional Lymph Nodes:       No lymph nodes submitted or found: X       Number of Lymph Nodes Involved: NA       Number of Lymph Nodes Examined: NA  Distant Metastasis: NA  Pathologic Stage Classification (pTNM, AJCC 8th Edition):  pT1a,  pN  Pathologic Findings in Nonneoplastic Kidney: interstitial chronic  inflammation, peritubular fibrosis and obstructive changes.    03/28/2022 Pathology Results   A. PROSTATE CHIPS, TRANSURETHRAL RESECTION:  Benign nodular glandular and stromal hyperplasia (15 g)  Focal acute and chronic prostatitis  Chronic and follicular urethritis  Negative for carcinoma    01/17/2023 Imaging   CT ABD RLL 8 mm nodule Right nephrectomy Partial left nephrectomy. No nodularity or growth along margin. No enhancing lesion.   02/07/2023 Cancer Staging   Staging form: Kidney, AJCC 7th Edition - Clinical: Stage I (T1a, N0, M0) - Signed by Tina Pauletta BROCKS, MD on 02/07/2023 Histologic grade (G): G3 Sarcomatoid features: Absent     MEDICAL HISTORY:  Past Medical History:  Diagnosis Date   Bacteremia 110/2017   Cancer (HCC)    renal mass at preop   Chronic kidney disease    CVA (cerebral infarction)    hemmorhagic  CVA   Depression    Dyslipidemia    Endocarditis    MV Vegitation   GERD (gastroesophageal reflux disease)    History of kidney stones    Hypertension    Mitral regurgitation    MRSA infection    2011   Myocardial infarction  [facility])    Stroke [facility])     SURGICAL HISTORY: Past Surgical History:  Procedure Laterality Date   CARDIAC CATHETERIZATION     CARDIOVERSION N/A 09/14/2020   Procedure: CARDIOVERSION;  Surgeon: Loni Soyla LABOR, MD;  Location: [facility] ENDOSCOPY;  Service: Cardiovascular;  Laterality: N/A;   MITRAL VALVE REPAIR     NO PAST SURGERIES     RIGHT/LEFT HEART CATH AND CORONARY ANGIOGRAPHY N/A 05/11/2020   Procedure: RIGHT/LEFT HEART CATH AND CORONARY ANGIOGRAPHY;  Surgeon: Wonda Sharper, MD;  Location: [facility] INVASIVE CV LAB;  Service: Cardiovascular;  Laterality: N/A;   ROBOT ASSISTED LAPAROSCOPIC NEPHRECTOMY Right 02/22/2016   Procedure: XI ROBOTIC ASSISTED LAPAROSCOPIC NEPHRECTOMY;  Surgeon: Ricardo Likens, MD;  Location: [facility] ORS;  Service: Urology;  Laterality: Right;   ROBOTIC ASSITED PARTIAL NEPHRECTOMY Left 06/07/2021   Procedure: XI ROBOTIC ASSITED PARTIAL NEPHRECTOMY;  Surgeon: Alvaro Hummer, MD;  Location: [facility] ORS;  Service: Urology;  Laterality: Left;  3 HRS   TEE WITHOUT CARDIOVERSION N/A 01/28/2020   Procedure: TRANSESOPHAGEAL ECHOCARDIOGRAM (TEE);  Surgeon: Okey Vina GAILS, MD;  Location: [facility] ENDOSCOPY;  Service: Cardiovascular;  Laterality: N/A;   TONSILLECTOMY     TRANSURETHRAL RESECTION OF PROSTATE N/A 03/28/2022   Procedure: TRANSURETHRAL RESECTION OF THE PROSTATE (TURP);  Surgeon: Alvaro Hummer, MD;  Location: [facility] ORS;  Service: Urology;  Laterality: N/A;    SOCIAL HISTORY: Social History   Socioeconomic History   Marital status: Married    Spouse name: Not on file   Number of children: 3   Years of education: 18   Highest education level: Professional school degree (e.g., MD, DDS, DVM, JD)  Occupational History   Not on file  Tobacco Use   Smoking status: Never   Smokeless tobacco: Never  Vaping Use   Vaping status: Never Used  Substance and Sexual Activity   Alcohol  use: Yes    Alcohol /week: 4.0 - 5.0 standard drinks of alcohol     Types: 4 - 5 Glasses of wine per week    Drug use: Yes    Types: Marijuana    Comment: Takes CBD gummies before bedtime   Sexual activity: Yes  Other Topics Concern   Not on file  Social History Narrative   Married   Clinical Research Associate   3 children   Never smoked   Alcohol  use--yes   Social Drivers of Corporate Investment Banker Strain: Not on file  Food Insecurity: No Food Insecurity (03/28/2022)   Hunger Vital Sign    Worried About Running Out of Food in the Last Year: Never true    Ran Out of Food in the Last Year: Never true  Transportation Needs: No Transportation Needs (03/28/2022)   PRAPARE - Administrator, Civil Service (Medical): No    Lack of Transportation (Non-Medical): No  Physical Activity: Not on file  Stress: Not on file  Social Connections: Not on file  Intimate Partner Violence: Not At Risk (03/28/2022)   Humiliation, Afraid, Rape, and Kick questionnaire    Fear of Current or Ex-Partner: No    Emotionally Abused: No    Physically Abused: No    Sexually Abused: No    FAMILY HISTORY: Family History  Problem Relation Age of Onset   Heart disease Mother        mom RF as a child (died of heart problem in  12 )    ALLERGIES:  has no known allergies.  MEDICATIONS:  Current Outpatient Medications  Medication Sig Dispense Refill   LORazepam  (ATIVAN ) 0.5 MG tablet Take 1 tablet (0.5 mg total) by mouth once for 1 dose. 1 tablet 0   acetaminophen  (TYLENOL ) 500 MG tablet Take 1,000 mg by mouth every 6 (six) hours as needed for moderate pain.     amoxicillin  (AMOXIL ) 500 MG capsule Take 4 capsules (2,000 mg total) by mouth as directed. 4 pills one hour before dental appointment 4 capsule 2   buPROPion  (WELLBUTRIN  XL) 150 MG 24 hr tablet Take 150 mg by mouth daily.     diphenhydramine -acetaminophen  (TYLENOL  PM) 25-500 MG TABS tablet Take 2 tablets by mouth at bedtime.     finasteride  (PROSCAR ) 5 MG tablet Take 5 mg by mouth daily.  losartan  (COZAAR ) 50 MG tablet Take 1 tablet (50 mg total) by mouth  daily. 90 tablet 3   OVER THE COUNTER MEDICATION Take 1-3 capsules by mouth See admin instructions. CBD gummies, Take 3 gummies at bedtime, may take 1 gummy in the middle of the night as needed for sleep     oxybutynin  (DITROPAN ) 5 MG tablet Take 5 mg by mouth at bedtime.     pantoprazole  (PROTONIX ) 40 MG tablet Take 40 mg by mouth daily as needed (acid reflux).     tadalafil (CIALIS) 20 MG tablet Take 20 mg by mouth daily as needed for erectile dysfunction.     traMADol  (ULTRAM ) 50 MG tablet Take 50 mg by mouth every 6 (six) hours as needed.     No current facility-administered medications for this visit.    REVIEW OF SYSTEMS:   All relevant systems were reviewed with the patient and are negative.  PHYSICAL EXAMINATION: ECOG PERFORMANCE STATUS: 1 - Symptomatic but completely ambulatory  Vitals:   02/08/23 1104  BP: 138/77  Pulse: 79  Resp: 18  Temp: (!) 97.3 F (36.3 C)  SpO2: 99%   Filed Weights   02/08/23 1104  Weight: 189 lb 4.8 oz (85.9 kg)    GENERAL: alert, no distress and comfortable SKIN: skin color is normal, no jaundice, rashes or significant lesions EYES: sclera clear OROPHARYNX: no exudate, no erythema NECK: supple LYMPH:  no palpable lymphadenopathy in the cervical, axillary regions LUNGS: Effort normal, no respiratory distress.  Clear to auscultation bilaterally HEART: regular rate & rhythm and no lower extremity edema ABDOMEN: soft, non-tender and nondistended Musculoskeletal: no point tenderness NEURO: no focal motor/sensory deficits  LABORATORY DATA:  I have reviewed the data as listed Lab Results  Component Value Date   WBC 8.9 02/08/2023   HGB 14.1 02/08/2023   HCT 42.6 02/08/2023   MCV 90.4 02/08/2023   PLT 195 02/08/2023   Recent Labs    03/07/22 0817 03/19/22 1434 03/29/22 0530 02/08/23 1133  NA  --  138 136 142  K  --  4.4 4.8 4.8  CL  --  108 105 107  CO2  --  23 23 28   GLUCOSE  --  127* 115* 90  BUN  --  26* 35* 26*  CREATININE   --  1.94* 2.06* 2.16*  CALCIUM   --  8.7* 8.3* 9.6  GFRNONAA  --  38* 35* 33*  PROT 6.5  --   --  7.6  ALBUMIN 4.3  --   --  4.8  AST 21  --   --  18  ALT 25  --   --  10  ALKPHOS 75  --   --  75  BILITOT 0.3  --   --  0.7  BILIDIR 0.11  --   --   --     RADIOGRAPHIC STUDIES: I have personally reviewed the radiological images as listed and agreed with the findings in the report. DG Chest 2 View Result Date: 01/17/2023 CLINICAL DATA:  Provided indication is malignant neoplasm. Prior report mentions right kidney neoplasm. EXAM: CHEST - 2 VIEW COMPARISON:  11/07/2021. FINDINGS: Previous cardiac surgery and mitral valve replacement. Cardiac silhouette is normal in size and configuration. Normal mediastinal and hilar contours. Focal area opacity at the base of the right lung on the frontal view, not visualized on the lateral view, consistent with superimposed bronchovascular structures. No convincing mass or true nodule. Lungs otherwise clear. No pleural effusion or pneumothorax. Skeletal  structures are intact. IMPRESSION: 1. No active cardiopulmonary disease. 2. No evidence of metastatic disease. Electronically Signed   By: Alm Parkins M.D.   On: 01/17/2023 13:37

## 2023-02-07 NOTE — Assessment & Plan Note (Addendum)
 With 8 mm RLL nodules. Will obtain CT chest to complete staging. MRI brain

## 2023-02-08 ENCOUNTER — Inpatient Hospital Stay: Payer: Medicare HMO

## 2023-02-08 ENCOUNTER — Other Ambulatory Visit: Payer: Self-pay | Admitting: *Deleted

## 2023-02-08 VITALS — BP 138/77 | HR 79 | Temp 97.3°F | Resp 18 | Wt 189.3 lb

## 2023-02-08 DIAGNOSIS — Z905 Acquired absence of kidney: Secondary | ICD-10-CM | POA: Diagnosis not present

## 2023-02-08 DIAGNOSIS — Z85528 Personal history of other malignant neoplasm of kidney: Secondary | ICD-10-CM | POA: Insufficient documentation

## 2023-02-08 DIAGNOSIS — Z79899 Other long term (current) drug therapy: Secondary | ICD-10-CM | POA: Diagnosis not present

## 2023-02-08 DIAGNOSIS — C649 Malignant neoplasm of unspecified kidney, except renal pelvis: Secondary | ICD-10-CM

## 2023-02-08 DIAGNOSIS — M25522 Pain in left elbow: Secondary | ICD-10-CM | POA: Diagnosis not present

## 2023-02-08 DIAGNOSIS — H532 Diplopia: Secondary | ICD-10-CM | POA: Diagnosis not present

## 2023-02-08 DIAGNOSIS — R911 Solitary pulmonary nodule: Secondary | ICD-10-CM | POA: Diagnosis not present

## 2023-02-08 LAB — CMP (CANCER CENTER ONLY)
ALT: 10 U/L (ref 0–44)
AST: 18 U/L (ref 15–41)
Albumin: 4.8 g/dL (ref 3.5–5.0)
Alkaline Phosphatase: 75 U/L (ref 38–126)
Anion gap: 7 (ref 5–15)
BUN: 26 mg/dL — ABNORMAL HIGH (ref 8–23)
CO2: 28 mmol/L (ref 22–32)
Calcium: 9.6 mg/dL (ref 8.9–10.3)
Chloride: 107 mmol/L (ref 98–111)
Creatinine: 2.16 mg/dL — ABNORMAL HIGH (ref 0.61–1.24)
GFR, Estimated: 33 mL/min — ABNORMAL LOW (ref >60)
Glucose, Bld: 90 mg/dL (ref 70–99)
Potassium: 4.8 mmol/L (ref 3.5–5.1)
Sodium: 142 mmol/L (ref 135–145)
Total Bilirubin: 0.7 mg/dL (ref 0.0–1.2)
Total Protein: 7.6 g/dL (ref 6.5–8.1)

## 2023-02-08 LAB — CBC WITH DIFFERENTIAL (CANCER CENTER ONLY)
Abs Immature Granulocytes: 0.04 10*3/uL (ref 0.00–0.07)
Basophils Absolute: 0 10*3/uL (ref 0.0–0.1)
Basophils Relative: 0 %
Eosinophils Absolute: 0.1 10*3/uL (ref 0.0–0.5)
Eosinophils Relative: 1 %
HCT: 42.6 % (ref 39.0–52.0)
Hemoglobin: 14.1 g/dL (ref 13.0–17.0)
Immature Granulocytes: 0 %
Lymphocytes Relative: 9 %
Lymphs Abs: 0.8 10*3/uL (ref 0.7–4.0)
MCH: 29.9 pg (ref 26.0–34.0)
MCHC: 33.1 g/dL (ref 30.0–36.0)
MCV: 90.4 fL (ref 80.0–100.0)
Monocytes Absolute: 1.3 10*3/uL — ABNORMAL HIGH (ref 0.1–1.0)
Monocytes Relative: 15 %
Neutro Abs: 6.7 10*3/uL (ref 1.7–7.7)
Neutrophils Relative %: 75 %
Platelet Count: 195 10*3/uL (ref 150–400)
RBC: 4.71 MIL/uL (ref 4.22–5.81)
RDW: 12.8 % (ref 11.5–15.5)
WBC Count: 8.9 10*3/uL (ref 4.0–10.5)
nRBC: 0 % (ref 0.0–0.2)

## 2023-02-08 LAB — LACTATE DEHYDROGENASE: LDH: 285 U/L — ABNORMAL HIGH (ref 98–192)

## 2023-02-08 MED ORDER — LORAZEPAM 0.5 MG PO TABS: 0.5000 mg | ORAL_TABLET | Freq: Once | ORAL | 0 refills | Status: AC

## 2023-02-11 DIAGNOSIS — Z905 Acquired absence of kidney: Secondary | ICD-10-CM | POA: Diagnosis not present

## 2023-02-11 DIAGNOSIS — D631 Anemia in chronic kidney disease: Secondary | ICD-10-CM | POA: Diagnosis not present

## 2023-02-11 DIAGNOSIS — N189 Chronic kidney disease, unspecified: Secondary | ICD-10-CM | POA: Diagnosis not present

## 2023-02-11 DIAGNOSIS — R918 Other nonspecific abnormal finding of lung field: Secondary | ICD-10-CM | POA: Diagnosis not present

## 2023-02-11 DIAGNOSIS — N1831 Chronic kidney disease, stage 3a: Secondary | ICD-10-CM | POA: Diagnosis not present

## 2023-02-11 DIAGNOSIS — I129 Hypertensive chronic kidney disease with stage 1 through stage 4 chronic kidney disease, or unspecified chronic kidney disease: Secondary | ICD-10-CM | POA: Diagnosis not present

## 2023-02-11 DIAGNOSIS — C641 Malignant neoplasm of right kidney, except renal pelvis: Secondary | ICD-10-CM | POA: Diagnosis not present

## 2023-02-12 ENCOUNTER — Ambulatory Visit (HOSPITAL_COMMUNITY)
Admission: RE | Admit: 2023-02-12 | Discharge: 2023-02-12 | Disposition: A | Discharge status: Home / Self Care | Payer: Medicare HMO | Source: Ambulatory Visit

## 2023-02-12 DIAGNOSIS — R9089 Other abnormal findings on diagnostic imaging of central nervous system: Secondary | ICD-10-CM | POA: Diagnosis not present

## 2023-02-12 DIAGNOSIS — Z8673 Personal history of transient ischemic attack (TIA), and cerebral infarction without residual deficits: Secondary | ICD-10-CM | POA: Insufficient documentation

## 2023-02-12 DIAGNOSIS — C649 Malignant neoplasm of unspecified kidney, except renal pelvis: Secondary | ICD-10-CM | POA: Diagnosis not present

## 2023-02-12 DIAGNOSIS — R918 Other nonspecific abnormal finding of lung field: Secondary | ICD-10-CM | POA: Insufficient documentation

## 2023-02-12 DIAGNOSIS — H532 Diplopia: Secondary | ICD-10-CM | POA: Insufficient documentation

## 2023-02-12 DIAGNOSIS — E041 Nontoxic single thyroid nodule: Secondary | ICD-10-CM | POA: Diagnosis not present

## 2023-02-12 DIAGNOSIS — I700 Atherosclerosis of aorta: Secondary | ICD-10-CM | POA: Insufficient documentation

## 2023-02-12 DIAGNOSIS — I6381 Other cerebral infarction due to occlusion or stenosis of small artery: Secondary | ICD-10-CM | POA: Diagnosis not present

## 2023-02-12 MED ORDER — GADOBUTROL 1 MMOL/ML IV SOLN
8.0000 mL | Freq: Once | INTRAVENOUS | Status: AC | PRN
  Administered 2023-02-12: 8 mL via INTRAVENOUS

## 2023-02-12 MED ORDER — IOHEXOL 300 MG/ML  SOLN
100.0000 mL | Freq: Once | INTRAMUSCULAR | Status: AC | PRN
  Administered 2023-02-12: 100 mL via INTRAVENOUS

## 2023-02-13 DIAGNOSIS — Z008 Encounter for other general examination: Secondary | ICD-10-CM | POA: Diagnosis not present

## 2023-02-13 DIAGNOSIS — Z87891 Personal history of nicotine dependence: Secondary | ICD-10-CM | POA: Diagnosis not present

## 2023-02-13 DIAGNOSIS — K219 Gastro-esophageal reflux disease without esophagitis: Secondary | ICD-10-CM | POA: Diagnosis not present

## 2023-02-13 DIAGNOSIS — Z8249 Family history of ischemic heart disease and other diseases of the circulatory system: Secondary | ICD-10-CM | POA: Diagnosis not present

## 2023-02-13 DIAGNOSIS — M1990 Unspecified osteoarthritis, unspecified site: Secondary | ICD-10-CM | POA: Diagnosis not present

## 2023-02-13 DIAGNOSIS — I4891 Unspecified atrial fibrillation: Secondary | ICD-10-CM | POA: Diagnosis not present

## 2023-02-13 DIAGNOSIS — N400 Enlarged prostate without lower urinary tract symptoms: Secondary | ICD-10-CM | POA: Diagnosis not present

## 2023-02-13 DIAGNOSIS — D6869 Other thrombophilia: Secondary | ICD-10-CM | POA: Diagnosis not present

## 2023-02-13 DIAGNOSIS — N1832 Chronic kidney disease, stage 3b: Secondary | ICD-10-CM | POA: Diagnosis not present

## 2023-02-13 DIAGNOSIS — Z823 Family history of stroke: Secondary | ICD-10-CM | POA: Diagnosis not present

## 2023-02-13 DIAGNOSIS — N529 Male erectile dysfunction, unspecified: Secondary | ICD-10-CM | POA: Diagnosis not present

## 2023-02-13 DIAGNOSIS — E785 Hyperlipidemia, unspecified: Secondary | ICD-10-CM | POA: Diagnosis not present

## 2023-02-13 DIAGNOSIS — I129 Hypertensive chronic kidney disease with stage 1 through stage 4 chronic kidney disease, or unspecified chronic kidney disease: Secondary | ICD-10-CM | POA: Diagnosis not present

## 2023-02-14 DIAGNOSIS — M25522 Pain in left elbow: Secondary | ICD-10-CM | POA: Diagnosis not present

## 2023-02-23 ENCOUNTER — Telehealth: Payer: Self-pay

## 2023-02-23 NOTE — Telephone Encounter (Signed)
 Advised patient to contact us to reschedule appointment if needed.

## 2023-02-25 DIAGNOSIS — M25522 Pain in left elbow: Secondary | ICD-10-CM | POA: Diagnosis not present

## 2023-03-04 ENCOUNTER — Ambulatory Visit: Payer: Medicare HMO

## 2023-03-06 NOTE — Progress Notes (Unsigned)
 Patient Care Team: [facility] as PCP - General Pricilla Riffle, MD as PCP - Cardiology (Cardiology)  Clinic Day:  03/07/2023  Referring physician: [facility]  ASSESSMENT & PLAN:   Assessment & Plan: Dorsey is a 68 y.o.male with history of CVA, HTN, MI, MVR and bilateral RCC being seen at [facility] for [facility].   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    Etiology of pulmonary nodule and mediastinal lymph node is unclear at this time.  Staging showed one RLL nodule of 9 mm, small mediastinal lymph nodes. Three other nodules are stable from 2022 suggesting benign.  We discussed the findings and options for management.  Discussed that biopsy for confirmation can be considered given the mediastinal lymph node may be accessible.  Alternatively, short-term follow-up every 3 months to monitor for progress.  If this is stage IV RCC, treatment will be palliative, not curative.  In some patient like favorable risk patients, surveillance can be an option.  Treatment usually result in more side effects considering patient is asymptomatic at this time.  After discussion, he and his wife are comfortable with monitoring.    Renal cell carcinoma (HCC) CT CAP in 3 months Follow-up CBC, CMP and LDH in 3 months Follow-up with me in about 3 months, after CT scan  Pulmonary nodules Short-term follow-up in 3 months  Mediastinal lymphadenopathy Short-term follow-up CT in 3 months ordered.    The patient understands the plans discussed today and is in agreement with them.  He knows to contact our office if he develops concerns prior to his next appointment.  Melven Sartorius, MD  [facility] - A DEPT OF [facility] [address] [place] [address] Dept: [phone] Dept Fax: [phone]   Orders Placed This  Encounter  Procedures   CT CHEST ABDOMEN PELVIS W CONTRAST    Standing Status:   Future    Expected Date:   06/03/2023    Expiration Date:   03/06/2024    If indicated for the ordered procedure, I authorize the administration of contrast media per Radiology protocol:   Yes    Does the patient have a contrast media/X-ray dye allergy?:   No    Preferred imaging location?:   [facility]    If indicated for the ordered procedure, I authorize the administration of oral contrast media per Radiology protocol:   Yes   CBC with Differential (Cancer Center Only)    Standing Status:   Future    Expiration Date:   03/06/2024   CMP (Cancer Center only)    Standing Status:   Future    Expiration Date:   03/06/2024   Lactate dehydrogenase    Standing Status:   Future    Expiration Date:   03/06/2024      CHIEF COMPLAINT:  CC: RCC  INTERVAL HISTORY:  Maciah is here today to go over imaging results.  Overall no clinical changes and feeling well.  CT showed:  Scattered small mediastinal lymph nodes are all subcentimeter short axis. For example 9 mm right hilar and subcarinal nodes both on series 2, image 74.   Lungs/Pleura:  7 mm left upper lobe nodule series 8, image 41, unchanged from 2022 exam. This is reportedly stable from 2010 exam (those images  not currently available).  9 x 9 mm right lower lobe nodule series 8, image 114, previously 8 mm, but new from 2023. Calcified granuloma in the right upper lobe series 8, image 28 and right lower lobe, series 8, image 118.   MRI of brain showed no evidence of metastatic disease.   I have reviewed the past medical history, past surgical history, social history and family history with the patient and they are unchanged from previous note.  ALLERGIES:  has no known allergies.  MEDICATIONS:  Current Outpatient Medications  Medication Sig Dispense Refill   acetaminophen (TYLENOL) 500 MG tablet Take 1,000 mg by mouth every 6 (six) hours as needed for  moderate pain.     amoxicillin (AMOXIL) 500 MG capsule Take 4 capsules (2,000 mg total) by mouth as directed. 4 pills one hour before dental appointment 4 capsule 2   buPROPion (WELLBUTRIN XL) 150 MG 24 hr tablet Take 150 mg by mouth daily.     diphenhydramine-acetaminophen (TYLENOL PM) 25-500 MG TABS tablet Take 2 tablets by mouth at bedtime.     finasteride (PROSCAR) 5 MG tablet Take 5 mg by mouth daily.     losartan (COZAAR) 50 MG tablet Take 1 tablet (50 mg total) by mouth daily. 90 tablet 3   OVER THE COUNTER MEDICATION Take 1-3 capsules by mouth See admin instructions. CBD gummies, Take 3 gummies at bedtime, may take 1 gummy in the middle of the night as needed for sleep     oxybutynin (DITROPAN) 5 MG tablet Take 5 mg by mouth at bedtime.     pantoprazole (PROTONIX) 40 MG tablet Take 40 mg by mouth daily as needed (acid reflux).     tadalafil (CIALIS) 20 MG tablet Take 20 mg by mouth daily as needed for erectile dysfunction.     traMADol (ULTRAM) 50 MG tablet Take 50 mg by mouth every 6 (six) hours as needed.     No current facility-administered medications for this visit.    HISTORY OF PRESENT ILLNESS:   Oncology History  Renal cell carcinoma (HCC)   Initial Diagnosis   Malignant neoplasm of renal pelvis (HCC)   02/22/2016 Pathology Results   Kidney, radical nephrectomy for tumor, right - CLEAR CELL RENAL CELL CARCINOMA, FUHRMAN NUCLEAR GRADE 2, SPANNING 4 CM IN GREATEST DIMENSION. - TUMOR IS CONFINED TO RENAL PARENCHYMA. - MARGINS ARE NEGATIVE. - SEE ONCOLOGY TEMPLATE. Microscopic Comment KIDNEY Specimen, including laterality: Right kidney. Procedure: Right robotic radical nephrectomy. Tumor focality: Right. Maximum tumor size (cm): 4 cm. Macroscopic extent of tumor: Tumor confined to kidney. Histologic type: Clear cell renal cell carcinoma. If sarcomatoid features give percentage: No sarcomatoid features identified. Fuhrman Nuclear Grade: 2. Margins (renal vein, ureter,  soft tissue): Negative. Renal vein invasion: Not identified. Microscopic tumor extension: Tumor confined to kidney. Adrenal gland: Not received. Lymph nodes: No lymph nodes received. TNM code: pT1a, pNX.   06/07/2021 Pathology Results   A. KIDNEY, LEFT, MASS, PARTIAL NEPHRECTOMY:  Clear cell renal cell carcinoma, nuclear grade 3, sized 2.8 cm  Tumor is limited to the kidney (pT1a)  All margins of resection are negative for tumor   KIDNEY:  Procedure: Partial Nephrectomy  Specimen Laterality: Left  Tumor Location: Lower pole  Tumor Size: 2.8 cm  Histologic Type: Clear cell renal cell carcinoma  Sarcomatoid Features: Negative  Rhabdoid Features: Negative  Histologic Grade (WHO/ISUP grade): G3  Tumor Necrosis: Negative  Tumor Extension: Organ confined  Margins: Negative  Regional Lymph Nodes:  No lymph nodes submitted or found: X       Number of Lymph Nodes Involved: NA       Number of Lymph Nodes Examined: NA  Distant Metastasis: NA  Pathologic Stage Classification (pTNM, AJCC 8th Edition):  pT1a,  pN  Pathologic Findings in Nonneoplastic Kidney: interstitial chronic  inflammation, peritubular fibrosis and obstructive changes.    03/28/2022 Pathology Results   A. PROSTATE CHIPS, TRANSURETHRAL RESECTION:  Benign nodular glandular and stromal hyperplasia (15 g)  Focal acute and chronic prostatitis  Chronic and follicular urethritis  Negative for carcinoma    01/17/2023 Imaging   CT ABD RLL 8 mm nodule Right nephrectomy Partial left nephrectomy. No nodularity or growth along margin. No enhancing lesion.   02/07/2023 Cancer Staging   Staging form: Kidney, AJCC 7th Edition - Clinical: Stage I (T1a, N0, M0) - Signed by Melven Sartorius, MD on 02/07/2023 Histologic grade (G): G3 Sarcomatoid features: Absent       REVIEW OF SYSTEMS:   All relevant systems were reviewed with the patient and are negative.   VITALS:  Blood pressure (!) 160/88, pulse 73, temperature 97.7  F (36.5 C), temperature source Temporal, resp. rate 16, height 5\' 8"  (1.727 m), weight 186 lb 12.8 oz (84.7 kg), SpO2 99%.  Wt Readings from Last 3 Encounters:  03/07/23 186 lb 12.8 oz (84.7 kg)  02/08/23 189 lb 4.8 oz (85.9 kg)  10/30/22 186 lb (84.4 kg)    Body mass index is 28.4 kg/m.  Performance status (ECOG): 0 - Asymptomatic  PHYSICAL EXAM:   GENERAL:alert, no distress and comfortable Here to discuss results.  LABORATORY DATA:  I have reviewed the data as listed    Component Value Date/Time   NA 142 02/08/2023 1133   NA 142 09/07/2020 1430   K 4.8 02/08/2023 1133   CL 107 02/08/2023 1133   CO2 28 02/08/2023 1133   GLUCOSE 90 02/08/2023 1133   BUN 26 (H) 02/08/2023 1133   BUN 17 09/07/2020 1430   CREATININE 2.16 (H) 02/08/2023 1133   CALCIUM 9.6 02/08/2023 1133   PROT 7.6 02/08/2023 1133   PROT 6.5 03/07/2022 0817   ALBUMIN 4.8 02/08/2023 1133   ALBUMIN 4.3 03/07/2022 0817   AST 18 02/08/2023 1133   ALT 10 02/08/2023 1133   ALKPHOS 75 02/08/2023 1133   BILITOT 0.7 02/08/2023 1133   GFRNONAA 33 (L) 02/08/2023 1133   GFRAA 54 (L) 01/26/2020 0848    No results found for: "SPEP", "UPEP"  Lab Results  Component Value Date   WBC 8.9 02/08/2023   NEUTROABS 6.7 02/08/2023   HGB 14.1 02/08/2023   HCT 42.6 02/08/2023   MCV 90.4 02/08/2023   PLT 195 02/08/2023      Chemistry      Component Value Date/Time   NA 142 02/08/2023 1133   NA 142 09/07/2020 1430   K 4.8 02/08/2023 1133   CL 107 02/08/2023 1133   CO2 28 02/08/2023 1133   BUN 26 (H) 02/08/2023 1133   BUN 17 09/07/2020 1430   CREATININE 2.16 (H) 02/08/2023 1133      Component Value Date/Time   CALCIUM 9.6 02/08/2023 1133   ALKPHOS 75 02/08/2023 1133   AST 18 02/08/2023 1133   ALT 10 02/08/2023 1133   BILITOT 0.7 02/08/2023 1133       RADIOGRAPHIC STUDIES: I have personally reviewed the radiological images as listed and agreed with the findings in the report. CT Chest W Contrast Result  Date:  02/28/2023 CLINICAL DATA:  Follow-up right lower lobe pulmonary nodule. History of renal cell carcinoma. EXAM: CT CHEST WITH CONTRAST TECHNIQUE: Multidetector CT imaging of the chest was performed during intravenous contrast administration. RADIATION DOSE REDUCTION: This exam was performed according to the departmental dose-optimization program which includes automated exposure control, adjustment of the mA and/or kV according to patient size and/or use of iterative reconstruction technique. CONTRAST:  OMNIPAQUE IOHEXOL 300 MG/ML  SOLN COMPARISON:  Lung bases from abdominopelvic CT 01/17/2023, chest CT 05/16/2020 FINDINGS: Cardiovascular: The heart is normal in size. Prosthetic mitral valve. No pericardial effusion. Coronary artery calcifications. Aortic atherosclerosis without aneurysm. There is no central pulmonary embolus. Mediastinum/Nodes: Scattered small mediastinal lymph nodes are all subcentimeter short axis. For example 9 mm right hilar and subcarinal nodes both on series 2, image 74. No esophageal wall thickening. Subcentimeter hypodense right thyroid nodule. Not clinically significant; no follow-up imaging recommended (ref: J Am Coll Radiol. 2015 Feb;12(2): 143-50). Lungs/Pleura: 7 mm left upper lobe nodule series 8, image 41, unchanged from 2022 exam. This is reportedly stable from 2010 exam (those images not currently available). 9 x 9 mm right lower lobe nodule series 8, image 114, previously 8 mm, but new from 2023. Calcified granuloma in the right upper lobe series 8, image 28 and right lower lobe, series 8, image 118. no pleural fluid or thickening. The trachea and central airways are clear. Upper Abdomen: No acute upper abdominal findings. Cyst in the left lobe of the liver as seen on prior. Right nephrectomy partially included in the field of view. Musculoskeletal: There are no acute or suspicious osseous abnormalities. Right gynecomastia. IMPRESSION: 1. Right lower lobe pulmonary  nodule measures 9 mm, 8 mm on CT last month, and new from more remote prior imaging. Nodule is indeterminate in etiology, but may represent an isolated metastasis in the setting of renal malignancy. Consider multi disciplinary assessment. Nodule approaches the size threshold for PET characterization. 2. Left upper lobe nodule is stable from 2022 exam (and reportedly from 2010 CT) and considered definitively benign. Calcified nodules consistent with prior granulomatous disease. These nodules need no specific imaging follow-up. 3. No additional noncalcified or suspicious pulmonary nodules. 4. Shotty right hilar and mediastinal lymph nodes at 9 mm. Aortic Atherosclerosis (ICD10-I70.0). Electronically Signed   By: Narda Rutherford M.D.   On: 02/28/2023 11:40   MR BRAIN W WO CONTRAST Result Date: 02/27/2023 CLINICAL DATA:  Brain metastases suspected. Double vision. Renal cell cancer. EXAM: MRI HEAD WITHOUT AND WITH CONTRAST TECHNIQUE: Multiplanar, multiecho pulse sequences of the brain and surrounding structures were obtained without and with intravenous contrast. CONTRAST:  8mL GADAVIST GADOBUTROL 1 MMOL/ML IV SOLN COMPARISON:  CT 06/16/2017.  MRI 11/02/2015 FINDINGS: Brain: Diffusion imaging does not show any acute or subacute infarction or other cause of restricted diffusion. No focal abnormality affects the brainstem. Mild cerebellar volume loss. Cerebral hemispheres show old infarctions affecting the left thalamus, left basal ganglia and right external capsule and radiating white matter tracts. Considerable chronic hemosiderin deposition in the region of the right external capsule infarction and mild hemosiderin deposition in the region of the left basal ganglia infarction. Few other scattered punctate foci of hemosiderin deposition. There is wallerian degeneration noted within the radiating white matter tracts on the right extending down into the cerebral peduncle. No evidence of mass, recent hemorrhage,  hydrocephalus or extra-axial collection. After contrast administration, no abnormal enhancement occurs. Vascular: Major vessels at the base of the brain show flow. Skull and upper cervical  spine: Negative Sinuses/Orbits: Clear/normal Other: None IMPRESSION: 1. No acute or reversible finding. No evidence of metastatic disease. 2. Old infarctions affecting the left thalamus, left basal ganglia and right external capsule and radiating white matter tracts. Considerable chronic hemosiderin deposition in the region of the right external capsule infarction and mild hemosiderin deposition in the region of the left basal ganglia infarction. Few other scattered punctate foci of hemosiderin deposition. Wallerian degeneration noted within the radiating white matter tracts on the right extending down into the cerebral peduncle. Electronically Signed   By: Paulina Fusi M.D.   On: 02/27/2023 09:47

## 2023-03-07 ENCOUNTER — Inpatient Hospital Stay: Payer: Medicare HMO

## 2023-03-07 ENCOUNTER — Other Ambulatory Visit: Payer: Self-pay | Admitting: *Deleted

## 2023-03-07 VITALS — BP 160/88 | HR 73 | Temp 97.7°F | Resp 16 | Ht 68.0 in | Wt 186.8 lb

## 2023-03-07 DIAGNOSIS — R591 Generalized enlarged lymph nodes: Secondary | ICD-10-CM | POA: Insufficient documentation

## 2023-03-07 DIAGNOSIS — R911 Solitary pulmonary nodule: Secondary | ICD-10-CM | POA: Diagnosis not present

## 2023-03-07 DIAGNOSIS — Z905 Acquired absence of kidney: Secondary | ICD-10-CM | POA: Diagnosis not present

## 2023-03-07 DIAGNOSIS — C641 Malignant neoplasm of right kidney, except renal pelvis: Secondary | ICD-10-CM

## 2023-03-07 DIAGNOSIS — R590 Localized enlarged lymph nodes: Secondary | ICD-10-CM

## 2023-03-07 DIAGNOSIS — R918 Other nonspecific abnormal finding of lung field: Secondary | ICD-10-CM | POA: Insufficient documentation

## 2023-03-07 DIAGNOSIS — Z79899 Other long term (current) drug therapy: Secondary | ICD-10-CM | POA: Diagnosis not present

## 2023-03-07 NOTE — Assessment & Plan Note (Signed)
 CT CAP in 3 months Follow-up CBC, CMP and LDH in 3 months Follow-up with me in about 3 months, after CT scan

## 2023-03-07 NOTE — Assessment & Plan Note (Signed)
 Short-term follow-up CT in 3 months ordered.

## 2023-03-07 NOTE — Assessment & Plan Note (Signed)
 Short-term follow-up in 3 months

## 2023-03-14 DIAGNOSIS — D171 Benign lipomatous neoplasm of skin and subcutaneous tissue of trunk: Secondary | ICD-10-CM | POA: Diagnosis not present

## 2023-03-14 DIAGNOSIS — D2262 Melanocytic nevi of left upper limb, including shoulder: Secondary | ICD-10-CM | POA: Diagnosis not present

## 2023-03-14 DIAGNOSIS — D225 Melanocytic nevi of trunk: Secondary | ICD-10-CM | POA: Diagnosis not present

## 2023-03-14 DIAGNOSIS — D2272 Melanocytic nevi of left lower limb, including hip: Secondary | ICD-10-CM | POA: Diagnosis not present

## 2023-03-14 DIAGNOSIS — L218 Other seborrheic dermatitis: Secondary | ICD-10-CM | POA: Diagnosis not present

## 2023-03-14 DIAGNOSIS — D2261 Melanocytic nevi of right upper limb, including shoulder: Secondary | ICD-10-CM | POA: Diagnosis not present

## 2023-03-14 DIAGNOSIS — D2271 Melanocytic nevi of right lower limb, including hip: Secondary | ICD-10-CM | POA: Diagnosis not present

## 2023-04-02 DIAGNOSIS — R918 Other nonspecific abnormal finding of lung field: Secondary | ICD-10-CM | POA: Diagnosis not present

## 2023-04-02 DIAGNOSIS — N1831 Chronic kidney disease, stage 3a: Secondary | ICD-10-CM | POA: Diagnosis not present

## 2023-04-02 DIAGNOSIS — C641 Malignant neoplasm of right kidney, except renal pelvis: Secondary | ICD-10-CM | POA: Diagnosis not present

## 2023-04-02 DIAGNOSIS — I129 Hypertensive chronic kidney disease with stage 1 through stage 4 chronic kidney disease, or unspecified chronic kidney disease: Secondary | ICD-10-CM | POA: Diagnosis not present

## 2023-04-02 DIAGNOSIS — D631 Anemia in chronic kidney disease: Secondary | ICD-10-CM | POA: Diagnosis not present

## 2023-04-02 DIAGNOSIS — N189 Chronic kidney disease, unspecified: Secondary | ICD-10-CM | POA: Diagnosis not present

## 2023-04-10 DIAGNOSIS — L820 Inflamed seborrheic keratosis: Secondary | ICD-10-CM | POA: Diagnosis not present

## 2023-04-10 DIAGNOSIS — L538 Other specified erythematous conditions: Secondary | ICD-10-CM | POA: Diagnosis not present

## 2023-05-07 DIAGNOSIS — M9901 Segmental and somatic dysfunction of cervical region: Secondary | ICD-10-CM | POA: Diagnosis not present

## 2023-05-07 DIAGNOSIS — M79651 Pain in right thigh: Secondary | ICD-10-CM | POA: Diagnosis not present

## 2023-05-07 DIAGNOSIS — M79652 Pain in left thigh: Secondary | ICD-10-CM | POA: Diagnosis not present

## 2023-05-07 DIAGNOSIS — M9904 Segmental and somatic dysfunction of sacral region: Secondary | ICD-10-CM | POA: Diagnosis not present

## 2023-05-08 DIAGNOSIS — M9904 Segmental and somatic dysfunction of sacral region: Secondary | ICD-10-CM | POA: Diagnosis not present

## 2023-05-08 DIAGNOSIS — M79652 Pain in left thigh: Secondary | ICD-10-CM | POA: Diagnosis not present

## 2023-05-08 DIAGNOSIS — M9901 Segmental and somatic dysfunction of cervical region: Secondary | ICD-10-CM | POA: Diagnosis not present

## 2023-05-08 DIAGNOSIS — M79651 Pain in right thigh: Secondary | ICD-10-CM | POA: Diagnosis not present

## 2023-05-10 DIAGNOSIS — M9901 Segmental and somatic dysfunction of cervical region: Secondary | ICD-10-CM | POA: Diagnosis not present

## 2023-05-10 DIAGNOSIS — M79652 Pain in left thigh: Secondary | ICD-10-CM | POA: Diagnosis not present

## 2023-05-10 DIAGNOSIS — M9904 Segmental and somatic dysfunction of sacral region: Secondary | ICD-10-CM | POA: Diagnosis not present

## 2023-05-10 DIAGNOSIS — M79651 Pain in right thigh: Secondary | ICD-10-CM | POA: Diagnosis not present

## 2023-05-13 DIAGNOSIS — M79652 Pain in left thigh: Secondary | ICD-10-CM | POA: Diagnosis not present

## 2023-05-13 DIAGNOSIS — M9901 Segmental and somatic dysfunction of cervical region: Secondary | ICD-10-CM | POA: Diagnosis not present

## 2023-05-13 DIAGNOSIS — M79651 Pain in right thigh: Secondary | ICD-10-CM | POA: Diagnosis not present

## 2023-05-13 DIAGNOSIS — M9904 Segmental and somatic dysfunction of sacral region: Secondary | ICD-10-CM | POA: Diagnosis not present

## 2023-05-15 DIAGNOSIS — M79651 Pain in right thigh: Secondary | ICD-10-CM | POA: Diagnosis not present

## 2023-05-15 DIAGNOSIS — M9901 Segmental and somatic dysfunction of cervical region: Secondary | ICD-10-CM | POA: Diagnosis not present

## 2023-05-15 DIAGNOSIS — M9904 Segmental and somatic dysfunction of sacral region: Secondary | ICD-10-CM | POA: Diagnosis not present

## 2023-05-15 DIAGNOSIS — M79652 Pain in left thigh: Secondary | ICD-10-CM | POA: Diagnosis not present

## 2023-05-17 DIAGNOSIS — M9901 Segmental and somatic dysfunction of cervical region: Secondary | ICD-10-CM | POA: Diagnosis not present

## 2023-05-17 DIAGNOSIS — M79651 Pain in right thigh: Secondary | ICD-10-CM | POA: Diagnosis not present

## 2023-05-17 DIAGNOSIS — M9904 Segmental and somatic dysfunction of sacral region: Secondary | ICD-10-CM | POA: Diagnosis not present

## 2023-05-17 DIAGNOSIS — M79652 Pain in left thigh: Secondary | ICD-10-CM | POA: Diagnosis not present

## 2023-05-20 DIAGNOSIS — M9901 Segmental and somatic dysfunction of cervical region: Secondary | ICD-10-CM | POA: Diagnosis not present

## 2023-05-20 DIAGNOSIS — M79652 Pain in left thigh: Secondary | ICD-10-CM | POA: Diagnosis not present

## 2023-05-20 DIAGNOSIS — M9904 Segmental and somatic dysfunction of sacral region: Secondary | ICD-10-CM | POA: Diagnosis not present

## 2023-05-20 DIAGNOSIS — M79651 Pain in right thigh: Secondary | ICD-10-CM | POA: Diagnosis not present

## 2023-05-22 DIAGNOSIS — M9904 Segmental and somatic dysfunction of sacral region: Secondary | ICD-10-CM | POA: Diagnosis not present

## 2023-05-22 DIAGNOSIS — M79652 Pain in left thigh: Secondary | ICD-10-CM | POA: Diagnosis not present

## 2023-05-22 DIAGNOSIS — M9901 Segmental and somatic dysfunction of cervical region: Secondary | ICD-10-CM | POA: Diagnosis not present

## 2023-05-22 DIAGNOSIS — M79651 Pain in right thigh: Secondary | ICD-10-CM | POA: Diagnosis not present

## 2023-05-28 DIAGNOSIS — M9904 Segmental and somatic dysfunction of sacral region: Secondary | ICD-10-CM | POA: Diagnosis not present

## 2023-05-28 DIAGNOSIS — M79651 Pain in right thigh: Secondary | ICD-10-CM | POA: Diagnosis not present

## 2023-05-28 DIAGNOSIS — M79652 Pain in left thigh: Secondary | ICD-10-CM | POA: Diagnosis not present

## 2023-05-28 DIAGNOSIS — M9901 Segmental and somatic dysfunction of cervical region: Secondary | ICD-10-CM | POA: Diagnosis not present

## 2023-05-29 DIAGNOSIS — M79651 Pain in right thigh: Secondary | ICD-10-CM | POA: Diagnosis not present

## 2023-05-29 DIAGNOSIS — M9904 Segmental and somatic dysfunction of sacral region: Secondary | ICD-10-CM | POA: Diagnosis not present

## 2023-05-29 DIAGNOSIS — M9901 Segmental and somatic dysfunction of cervical region: Secondary | ICD-10-CM | POA: Diagnosis not present

## 2023-05-29 DIAGNOSIS — M79652 Pain in left thigh: Secondary | ICD-10-CM | POA: Diagnosis not present

## 2023-05-31 DIAGNOSIS — M79652 Pain in left thigh: Secondary | ICD-10-CM | POA: Diagnosis not present

## 2023-05-31 DIAGNOSIS — M9901 Segmental and somatic dysfunction of cervical region: Secondary | ICD-10-CM | POA: Diagnosis not present

## 2023-05-31 DIAGNOSIS — M9904 Segmental and somatic dysfunction of sacral region: Secondary | ICD-10-CM | POA: Diagnosis not present

## 2023-05-31 DIAGNOSIS — M79651 Pain in right thigh: Secondary | ICD-10-CM | POA: Diagnosis not present

## 2023-06-03 ENCOUNTER — Encounter (HOSPITAL_COMMUNITY): Payer: Self-pay

## 2023-06-03 ENCOUNTER — Other Ambulatory Visit: Payer: Self-pay

## 2023-06-03 ENCOUNTER — Ambulatory Visit (HOSPITAL_COMMUNITY)
Admission: RE | Admit: 2023-06-03 | Discharge: 2023-06-03 | Disposition: A | Discharge status: Home / Self Care | Source: Ambulatory Visit

## 2023-06-03 DIAGNOSIS — R590 Localized enlarged lymph nodes: Secondary | ICD-10-CM | POA: Insufficient documentation

## 2023-06-03 DIAGNOSIS — R911 Solitary pulmonary nodule: Secondary | ICD-10-CM | POA: Insufficient documentation

## 2023-06-03 DIAGNOSIS — Z905 Acquired absence of kidney: Secondary | ICD-10-CM | POA: Diagnosis not present

## 2023-06-03 DIAGNOSIS — C641 Malignant neoplasm of right kidney, except renal pelvis: Secondary | ICD-10-CM | POA: Diagnosis not present

## 2023-06-03 DIAGNOSIS — R918 Other nonspecific abnormal finding of lung field: Secondary | ICD-10-CM | POA: Diagnosis not present

## 2023-06-03 DIAGNOSIS — K7689 Other specified diseases of liver: Secondary | ICD-10-CM | POA: Diagnosis not present

## 2023-06-03 LAB — POCT I-STAT CREATININE: Creatinine, Ser: 2.2 mg/dL — ABNORMAL HIGH (ref 0.61–1.24)

## 2023-06-04 DIAGNOSIS — M9901 Segmental and somatic dysfunction of cervical region: Secondary | ICD-10-CM | POA: Diagnosis not present

## 2023-06-04 DIAGNOSIS — M9904 Segmental and somatic dysfunction of sacral region: Secondary | ICD-10-CM | POA: Diagnosis not present

## 2023-06-04 DIAGNOSIS — M79652 Pain in left thigh: Secondary | ICD-10-CM | POA: Diagnosis not present

## 2023-06-04 DIAGNOSIS — M79651 Pain in right thigh: Secondary | ICD-10-CM | POA: Diagnosis not present

## 2023-06-05 DIAGNOSIS — M9904 Segmental and somatic dysfunction of sacral region: Secondary | ICD-10-CM | POA: Diagnosis not present

## 2023-06-05 DIAGNOSIS — M9901 Segmental and somatic dysfunction of cervical region: Secondary | ICD-10-CM | POA: Diagnosis not present

## 2023-06-05 DIAGNOSIS — M79651 Pain in right thigh: Secondary | ICD-10-CM | POA: Diagnosis not present

## 2023-06-05 DIAGNOSIS — M79652 Pain in left thigh: Secondary | ICD-10-CM | POA: Diagnosis not present

## 2023-06-07 DIAGNOSIS — M79651 Pain in right thigh: Secondary | ICD-10-CM | POA: Diagnosis not present

## 2023-06-07 DIAGNOSIS — M9901 Segmental and somatic dysfunction of cervical region: Secondary | ICD-10-CM | POA: Diagnosis not present

## 2023-06-07 DIAGNOSIS — M9904 Segmental and somatic dysfunction of sacral region: Secondary | ICD-10-CM | POA: Diagnosis not present

## 2023-06-07 DIAGNOSIS — M79652 Pain in left thigh: Secondary | ICD-10-CM | POA: Diagnosis not present

## 2023-06-08 NOTE — Assessment & Plan Note (Addendum)
 Short-term follow-up in 3 months

## 2023-06-08 NOTE — Progress Notes (Unsigned)
 [facility] OFFICE PROGRESS NOTE  Patient Care Team: Pa, [facility] as PCP - General Elmyra Haggard, MD as PCP - Cardiology (Cardiology)  [name] is a 68 y.o.male with history of CVA, HTN, MI, MVR and bilateral RCC being seen at [facility] Clinic for [facility].   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    Staging showed one RLL nodule of 9 mm, small mediastinal lymph nodes. Three other nodules are stable from 2022 suggesting benign.  We discussed the findings and options for management.  Previously discussed that biopsy for confirmation can be considered given the mediastinal lymph node may be accessible.  Alternatively, short-term follow-up every 3 months to monitor for progress.  If this is stage IV RCC, treatment will be palliative, not curative. Favorable risk patients can also be monitored.   New and previous relevant imaging reviewed. Follow up CT showed stable nodules. Discussed results today.  Repeat CT on 9/9 with lab before CT scan. Follow up about a week after CT scan. Assessment & Plan Pulmonary nodules RLL nodule stable. Continue monitor Stable 5 mm left upper lobe pulmonary nodule appeared in 2022. Short-term follow-up in 3 months Renal cell carcinoma, unspecified laterality (HCC) CT CAP in 3 months Follow-up CBC, CMP and LDH in 3 months Follow-up with me in about 3 months, after CT scan Stage 3b chronic kidney disease (HCC) Fluid 60-70 oz per day   Orders Placed This Encounter  Procedures   CT CHEST ABDOMEN PELVIS WO CONTRAST    Standing Status:   Future    Expiration Date:   06/09/2024    Preferred imaging location?:   [facility]    If indicated for the ordered procedure, I authorize the administration of oral contrast media per Radiology protocol:   Yes    Does the patient have a contrast media/X-ray dye allergy?:   No   CBC with Differential  (Cancer Center Only)    Standing Status:   Future    Expiration Date:   06/09/2024   CMP (Cancer Center only)    Standing Status:   Future    Expiration Date:   06/09/2024   Lactate dehydrogenase    Standing Status:   Future    Expiration Date:   06/09/2024     Lowanda Ruddy, MD  INTERVAL HISTORY: Patient returns for follow-up. Energy is good. No new bone pain, back pain, headache. No physical limitation.  Oncology History  Renal cell carcinoma [facility])   Initial Diagnosis   Malignant neoplasm of renal pelvis (HCC)   02/22/2016 Pathology Results   Kidney, radical nephrectomy for tumor, right - CLEAR CELL RENAL CELL CARCINOMA, FUHRMAN NUCLEAR GRADE 2, SPANNING 4 CM IN GREATEST DIMENSION. - TUMOR IS CONFINED TO RENAL PARENCHYMA. - MARGINS ARE NEGATIVE. - SEE ONCOLOGY TEMPLATE. Microscopic Comment KIDNEY Specimen, including laterality: Right kidney. Procedure: Right robotic radical nephrectomy. Tumor focality: Right. Maximum tumor size (cm): 4 cm. Macroscopic extent of tumor: Tumor confined to kidney. Histologic type: Clear cell renal cell carcinoma. If sarcomatoid features give percentage: No sarcomatoid features identified. Fuhrman Nuclear Grade: 2. Margins (renal vein, ureter, soft tissue): Negative. Renal vein invasion: Not identified. Microscopic tumor extension: Tumor confined to kidney. Adrenal gland: Not received. Lymph nodes: No lymph nodes received. TNM code: pT1a, pNX.   06/07/2021 Pathology Results   A. KIDNEY, LEFT, MASS, PARTIAL NEPHRECTOMY:  Clear  cell renal cell carcinoma, nuclear grade 3, sized 2.8 cm  Tumor is limited to the kidney (pT1a)  All margins of resection are negative for tumor   KIDNEY:  Procedure: Partial Nephrectomy  Specimen Laterality: Left  Tumor Location: Lower pole  Tumor Size: 2.8 cm  Histologic Type: Clear cell renal cell carcinoma  Sarcomatoid Features: Negative  Rhabdoid Features: Negative  Histologic Grade (WHO/ISUP grade): G3   Tumor Necrosis: Negative  Tumor Extension: Organ confined  Margins: Negative  Regional Lymph Nodes:       No lymph nodes submitted or found: X       Number of Lymph Nodes Involved: NA       Number of Lymph Nodes Examined: NA  Distant Metastasis: NA  Pathologic Stage Classification (pTNM, AJCC 8th Edition):  pT1a,  pN  Pathologic Findings in Nonneoplastic Kidney: interstitial chronic  inflammation, peritubular fibrosis and obstructive changes.    03/28/2022 Pathology Results   A. PROSTATE CHIPS, TRANSURETHRAL RESECTION:  Benign nodular glandular and stromal hyperplasia (15 g)  Focal acute and chronic prostatitis  Chronic and follicular urethritis  Negative for carcinoma    01/17/2023 Imaging   CT ABD RLL 8 mm nodule Right nephrectomy Partial left nephrectomy. No nodularity or growth along margin. No enhancing lesion.   02/07/2023 Cancer Staging   Staging form: Kidney, AJCC 7th Edition - Clinical: Stage I (T1a, N0, M0) - Signed by Lowanda Ruddy, MD on 02/07/2023 Histologic grade (G): G3 Sarcomatoid features: Absent   06/03/2023 Imaging   CT CAP Stable pulmonary nodules measuring up to 8 mm.   Status post right nephrectomy and partial left nephrectomy with no evidence for malignancy within the abdomen or pelvis.      PHYSICAL EXAMINATION: ECOG PERFORMANCE STATUS: 1 - Symptomatic but completely ambulatory  VSS  GENERAL: alert, no distress and comfortable SKIN: skin color normal. No jaundice on exposed skin LYMPH:  no palpable cervical, lymphadenopathy  LUNGS: clear to auscultation and percussion with normal breathing effort HEART: regular rate & rhythm  ABDOMEN: abdomen soft, non-tender and nondistended. NEURO: no focal motor/sensory deficits  Relevant data reviewed during this visit included labs and imaging.

## 2023-06-08 NOTE — Assessment & Plan Note (Signed)
 CT CAP in 3 months Follow-up CBC, CMP and LDH in 3 months Follow-up with me in about 3 months, after CT scan

## 2023-06-10 ENCOUNTER — Inpatient Hospital Stay

## 2023-06-10 DIAGNOSIS — R918 Other nonspecific abnormal finding of lung field: Secondary | ICD-10-CM | POA: Diagnosis not present

## 2023-06-10 DIAGNOSIS — Z85528 Personal history of other malignant neoplasm of kidney: Secondary | ICD-10-CM | POA: Diagnosis not present

## 2023-06-10 DIAGNOSIS — C649 Malignant neoplasm of unspecified kidney, except renal pelvis: Secondary | ICD-10-CM

## 2023-06-10 DIAGNOSIS — C641 Malignant neoplasm of right kidney, except renal pelvis: Secondary | ICD-10-CM | POA: Insufficient documentation

## 2023-06-10 DIAGNOSIS — Z905 Acquired absence of kidney: Secondary | ICD-10-CM | POA: Diagnosis not present

## 2023-06-10 DIAGNOSIS — N1832 Chronic kidney disease, stage 3b: Secondary | ICD-10-CM

## 2023-06-10 DIAGNOSIS — N183 Chronic kidney disease, stage 3 unspecified: Secondary | ICD-10-CM | POA: Insufficient documentation

## 2023-06-10 LAB — CMP (CANCER CENTER ONLY)
ALT: 9 U/L (ref 0–44)
AST: 13 U/L — ABNORMAL LOW (ref 15–41)
Albumin: 4.6 g/dL (ref 3.5–5.0)
Alkaline Phosphatase: 66 U/L (ref 38–126)
Anion gap: 6 (ref 5–15)
BUN: 27 mg/dL — ABNORMAL HIGH (ref 8–23)
CO2: 24 mmol/L (ref 22–32)
Calcium: 9.3 mg/dL (ref 8.9–10.3)
Chloride: 110 mmol/L (ref 98–111)
Creatinine: 2.04 mg/dL — ABNORMAL HIGH (ref 0.61–1.24)
GFR, Estimated: 35 mL/min — ABNORMAL LOW (ref >60)
Glucose, Bld: 93 mg/dL (ref 70–99)
Potassium: 4.4 mmol/L (ref 3.5–5.1)
Sodium: 140 mmol/L (ref 135–145)
Total Bilirubin: 0.6 mg/dL (ref 0.0–1.2)
Total Protein: 7.2 g/dL (ref 6.5–8.1)

## 2023-06-10 LAB — CBC WITH DIFFERENTIAL (CANCER CENTER ONLY)
Abs Immature Granulocytes: 0.02 10*3/uL (ref 0.00–0.07)
Basophils Absolute: 0 10*3/uL (ref 0.0–0.1)
Basophils Relative: 0 %
Eosinophils Absolute: 0.1 10*3/uL (ref 0.0–0.5)
Eosinophils Relative: 2 %
HCT: 41.6 % (ref 39.0–52.0)
Hemoglobin: 14.3 g/dL (ref 13.0–17.0)
Immature Granulocytes: 0 %
Lymphocytes Relative: 19 %
Lymphs Abs: 1.1 10*3/uL (ref 0.7–4.0)
MCH: 30 pg (ref 26.0–34.0)
MCHC: 34.4 g/dL (ref 30.0–36.0)
MCV: 87.4 fL (ref 80.0–100.0)
Monocytes Absolute: 0.9 10*3/uL (ref 0.1–1.0)
Monocytes Relative: 17 %
Neutro Abs: 3.3 10*3/uL (ref 1.7–7.7)
Neutrophils Relative %: 62 %
Platelet Count: 278 10*3/uL (ref 150–400)
RBC: 4.76 MIL/uL (ref 4.22–5.81)
RDW: 13 % (ref 11.5–15.5)
WBC Count: 5.5 10*3/uL (ref 4.0–10.5)
nRBC: 0 % (ref 0.0–0.2)

## 2023-06-10 LAB — LACTATE DEHYDROGENASE: LDH: 217 U/L — ABNORMAL HIGH (ref 98–192)

## 2023-06-10 NOTE — Assessment & Plan Note (Addendum)
 Fluid 60-70 oz per day

## 2023-06-10 NOTE — Progress Notes (Signed)
 Nutrition Assessment   Reason for Assessment:  Healthy eating   ASSESSMENT:  68 year old male with bilateral RCC currently not under treatment and right lower lung nodule (monitoring every 3 months).    Met with patient and wife, [name].  Wife interested in discussing nutrition to protect 3/4 of remaining kidney.  Patient reports usually eats 1/2 bagel with goat cheese for breakfast most mornings.  One time a week goes to [employer] for biscuit.  Drinks juice. Lunch maybe [employer] grill meat and vegetables (green beans, mac and cheese).  Dinner is meat and couple of sides.  Snacks on candy, watermelon.  Does not really like very many vegetables.  Does not drink much fluid especially water .  Walks dog 2 times a day.     Medications: protonix    Labs: BUN 27, creatinine 2.04   Anthropometrics:   Height: 68 inches Weight: 181 lb today per pt 185 lb on 3/18 BMI: 27  2% weight loss, not significant in 3 months  Estimated Energy Needs  Kcals: 1800-2050 Protein: 65-82 g day Fluid: 1800-2050 ml   NUTRITION DIAGNOSIS: Food and nutrition related knowledge deficit related to cancer and kidney concerns as evidenced by wanting to discuss healthy eating    MALNUTRITION DIAGNOSIS: not at this time   INTERVENTION:  Regarding kidney health would encourage low sodium diet.  Low Sodium Nutrition Therapy handout from [facility] provided Encouraged plant foods, lean meats.  Discussed recommendations from [employer] for [employer].  Handout provided on Nutrition for Cancer Survivors from [facility].   Encouraged adequate hydration Contact information provided    Next Visit: no follow-up RD available as needed  Login Muckleroy B. Zollie Hipp, CSO, LDN Registered Dietitian [phone]

## 2023-06-11 DIAGNOSIS — M9901 Segmental and somatic dysfunction of cervical region: Secondary | ICD-10-CM | POA: Diagnosis not present

## 2023-06-11 DIAGNOSIS — M79652 Pain in left thigh: Secondary | ICD-10-CM | POA: Diagnosis not present

## 2023-06-11 DIAGNOSIS — M79651 Pain in right thigh: Secondary | ICD-10-CM | POA: Diagnosis not present

## 2023-06-11 DIAGNOSIS — M9904 Segmental and somatic dysfunction of sacral region: Secondary | ICD-10-CM | POA: Diagnosis not present

## 2023-06-12 DIAGNOSIS — M79652 Pain in left thigh: Secondary | ICD-10-CM | POA: Diagnosis not present

## 2023-06-12 DIAGNOSIS — M9904 Segmental and somatic dysfunction of sacral region: Secondary | ICD-10-CM | POA: Diagnosis not present

## 2023-06-12 DIAGNOSIS — M79651 Pain in right thigh: Secondary | ICD-10-CM | POA: Diagnosis not present

## 2023-06-12 DIAGNOSIS — M9901 Segmental and somatic dysfunction of cervical region: Secondary | ICD-10-CM | POA: Diagnosis not present

## 2023-06-25 DIAGNOSIS — M9904 Segmental and somatic dysfunction of sacral region: Secondary | ICD-10-CM | POA: Diagnosis not present

## 2023-06-25 DIAGNOSIS — M79652 Pain in left thigh: Secondary | ICD-10-CM | POA: Diagnosis not present

## 2023-06-25 DIAGNOSIS — M9901 Segmental and somatic dysfunction of cervical region: Secondary | ICD-10-CM | POA: Diagnosis not present

## 2023-06-25 DIAGNOSIS — M79651 Pain in right thigh: Secondary | ICD-10-CM | POA: Diagnosis not present

## 2023-06-26 DIAGNOSIS — M79651 Pain in right thigh: Secondary | ICD-10-CM | POA: Diagnosis not present

## 2023-06-26 DIAGNOSIS — M79652 Pain in left thigh: Secondary | ICD-10-CM | POA: Diagnosis not present

## 2023-06-26 DIAGNOSIS — M9901 Segmental and somatic dysfunction of cervical region: Secondary | ICD-10-CM | POA: Diagnosis not present

## 2023-06-26 DIAGNOSIS — M9904 Segmental and somatic dysfunction of sacral region: Secondary | ICD-10-CM | POA: Diagnosis not present

## 2023-07-02 DIAGNOSIS — M79651 Pain in right thigh: Secondary | ICD-10-CM | POA: Diagnosis not present

## 2023-07-02 DIAGNOSIS — M79652 Pain in left thigh: Secondary | ICD-10-CM | POA: Diagnosis not present

## 2023-07-02 DIAGNOSIS — M9904 Segmental and somatic dysfunction of sacral region: Secondary | ICD-10-CM | POA: Diagnosis not present

## 2023-07-02 DIAGNOSIS — M9901 Segmental and somatic dysfunction of cervical region: Secondary | ICD-10-CM | POA: Diagnosis not present

## 2023-07-04 DIAGNOSIS — M79652 Pain in left thigh: Secondary | ICD-10-CM | POA: Diagnosis not present

## 2023-07-04 DIAGNOSIS — M9901 Segmental and somatic dysfunction of cervical region: Secondary | ICD-10-CM | POA: Diagnosis not present

## 2023-07-04 DIAGNOSIS — M9904 Segmental and somatic dysfunction of sacral region: Secondary | ICD-10-CM | POA: Diagnosis not present

## 2023-07-04 DIAGNOSIS — M79651 Pain in right thigh: Secondary | ICD-10-CM | POA: Diagnosis not present

## 2023-07-09 DIAGNOSIS — M79652 Pain in left thigh: Secondary | ICD-10-CM | POA: Diagnosis not present

## 2023-07-09 DIAGNOSIS — M79651 Pain in right thigh: Secondary | ICD-10-CM | POA: Diagnosis not present

## 2023-07-09 DIAGNOSIS — M9901 Segmental and somatic dysfunction of cervical region: Secondary | ICD-10-CM | POA: Diagnosis not present

## 2023-07-09 DIAGNOSIS — M9904 Segmental and somatic dysfunction of sacral region: Secondary | ICD-10-CM | POA: Diagnosis not present

## 2023-07-10 DIAGNOSIS — M9904 Segmental and somatic dysfunction of sacral region: Secondary | ICD-10-CM | POA: Diagnosis not present

## 2023-07-10 DIAGNOSIS — M9901 Segmental and somatic dysfunction of cervical region: Secondary | ICD-10-CM | POA: Diagnosis not present

## 2023-07-10 DIAGNOSIS — M79652 Pain in left thigh: Secondary | ICD-10-CM | POA: Diagnosis not present

## 2023-07-10 DIAGNOSIS — M79651 Pain in right thigh: Secondary | ICD-10-CM | POA: Diagnosis not present

## 2023-07-14 ENCOUNTER — Inpatient Hospital Stay (HOSPITAL_COMMUNITY)

## 2023-07-14 ENCOUNTER — Emergency Department (HOSPITAL_COMMUNITY)

## 2023-07-14 ENCOUNTER — Encounter (HOSPITAL_COMMUNITY): Payer: Self-pay | Admitting: Internal Medicine

## 2023-07-14 ENCOUNTER — Other Ambulatory Visit: Payer: Self-pay

## 2023-07-14 ENCOUNTER — Observation Stay (HOSPITAL_COMMUNITY)
Admission: EM | Admit: 2023-07-14 | Discharge: 2023-07-15 | Disposition: A | Discharge status: Home Health | Attending: Internal Medicine | Admitting: Internal Medicine

## 2023-07-14 DIAGNOSIS — Z7901 Long term (current) use of anticoagulants: Secondary | ICD-10-CM | POA: Diagnosis not present

## 2023-07-14 DIAGNOSIS — G9340 Encephalopathy, unspecified: Secondary | ICD-10-CM | POA: Diagnosis not present

## 2023-07-14 DIAGNOSIS — R569 Unspecified convulsions: Principal | ICD-10-CM | POA: Insufficient documentation

## 2023-07-14 DIAGNOSIS — I517 Cardiomegaly: Secondary | ICD-10-CM | POA: Diagnosis not present

## 2023-07-14 DIAGNOSIS — I129 Hypertensive chronic kidney disease with stage 1 through stage 4 chronic kidney disease, or unspecified chronic kidney disease: Secondary | ICD-10-CM | POA: Insufficient documentation

## 2023-07-14 DIAGNOSIS — I4891 Unspecified atrial fibrillation: Secondary | ICD-10-CM | POA: Diagnosis not present

## 2023-07-14 DIAGNOSIS — Z85528 Personal history of other malignant neoplasm of kidney: Secondary | ICD-10-CM | POA: Insufficient documentation

## 2023-07-14 DIAGNOSIS — N1832 Chronic kidney disease, stage 3b: Secondary | ICD-10-CM | POA: Diagnosis not present

## 2023-07-14 DIAGNOSIS — R404 Transient alteration of awareness: Secondary | ICD-10-CM | POA: Diagnosis not present

## 2023-07-14 DIAGNOSIS — J45909 Unspecified asthma, uncomplicated: Secondary | ICD-10-CM | POA: Diagnosis not present

## 2023-07-14 DIAGNOSIS — R456 Violent behavior: Secondary | ICD-10-CM | POA: Diagnosis not present

## 2023-07-14 DIAGNOSIS — E785 Hyperlipidemia, unspecified: Secondary | ICD-10-CM | POA: Diagnosis not present

## 2023-07-14 DIAGNOSIS — I69351 Hemiplegia and hemiparesis following cerebral infarction affecting right dominant side: Secondary | ICD-10-CM | POA: Diagnosis not present

## 2023-07-14 DIAGNOSIS — I6930 Unspecified sequelae of cerebral infarction: Secondary | ICD-10-CM

## 2023-07-14 DIAGNOSIS — N183 Chronic kidney disease, stage 3 unspecified: Secondary | ICD-10-CM | POA: Diagnosis not present

## 2023-07-14 DIAGNOSIS — Z8673 Personal history of transient ischemic attack (TIA), and cerebral infarction without residual deficits: Secondary | ICD-10-CM | POA: Insufficient documentation

## 2023-07-14 DIAGNOSIS — Z905 Acquired absence of kidney: Secondary | ICD-10-CM | POA: Diagnosis not present

## 2023-07-14 DIAGNOSIS — R4182 Altered mental status, unspecified: Secondary | ICD-10-CM | POA: Diagnosis not present

## 2023-07-14 DIAGNOSIS — Z79899 Other long term (current) drug therapy: Secondary | ICD-10-CM | POA: Insufficient documentation

## 2023-07-14 DIAGNOSIS — G40909 Epilepsy, unspecified, not intractable, without status epilepticus: Secondary | ICD-10-CM | POA: Diagnosis not present

## 2023-07-14 DIAGNOSIS — R6883 Chills (without fever): Secondary | ICD-10-CM | POA: Insufficient documentation

## 2023-07-14 DIAGNOSIS — I10 Essential (primary) hypertension: Secondary | ICD-10-CM | POA: Diagnosis present

## 2023-07-14 DIAGNOSIS — R410 Disorientation, unspecified: Secondary | ICD-10-CM | POA: Diagnosis not present

## 2023-07-14 DIAGNOSIS — R000 Tachycardia, unspecified: Secondary | ICD-10-CM | POA: Diagnosis not present

## 2023-07-14 DIAGNOSIS — R0689 Other abnormalities of breathing: Secondary | ICD-10-CM | POA: Diagnosis not present

## 2023-07-14 DIAGNOSIS — I340 Nonrheumatic mitral (valve) insufficiency: Secondary | ICD-10-CM | POA: Diagnosis not present

## 2023-07-14 DIAGNOSIS — I69392 Facial weakness following cerebral infarction: Secondary | ICD-10-CM | POA: Insufficient documentation

## 2023-07-14 LAB — TROPONIN I (HIGH SENSITIVITY)
Troponin I (High Sensitivity): 16 ng/L (ref <18)
Troponin I (High Sensitivity): 21 ng/L — ABNORMAL HIGH (ref <18)

## 2023-07-14 LAB — CBC WITH DIFFERENTIAL/PLATELET
Abs Immature Granulocytes: 0.11 K/uL — ABNORMAL HIGH (ref 0.00–0.07)
Basophils Absolute: 0 K/uL (ref 0.0–0.1)
Basophils Relative: 0 %
Eosinophils Absolute: 0.1 K/uL (ref 0.0–0.5)
Eosinophils Relative: 1 %
HCT: 43.2 % (ref 39.0–52.0)
Hemoglobin: 14.6 g/dL (ref 13.0–17.0)
Immature Granulocytes: 1 %
Lymphocytes Relative: 8 %
Lymphs Abs: 0.8 K/uL (ref 0.7–4.0)
MCH: 30.4 pg (ref 26.0–34.0)
MCHC: 33.8 g/dL (ref 30.0–36.0)
MCV: 89.8 fL (ref 80.0–100.0)
Monocytes Absolute: 1.3 K/uL — ABNORMAL HIGH (ref 0.1–1.0)
Monocytes Relative: 12 %
Neutro Abs: 8.7 K/uL — ABNORMAL HIGH (ref 1.7–7.7)
Neutrophils Relative %: 78 %
Platelets: 208 K/uL (ref 150–400)
RBC: 4.81 MIL/uL (ref 4.22–5.81)
RDW: 13.1 % (ref 11.5–15.5)
WBC: 11.1 K/uL — ABNORMAL HIGH (ref 4.0–10.5)
nRBC: 0 % (ref 0.0–0.2)

## 2023-07-14 LAB — LIPID PANEL
Cholesterol: 299 mg/dL — ABNORMAL HIGH (ref 0–200)
HDL: 52 mg/dL (ref >40)
LDL Cholesterol: 221 mg/dL — ABNORMAL HIGH (ref 0–99)
Total CHOL/HDL Ratio: 5.8 ratio
Triglycerides: 132 mg/dL (ref <150)
VLDL: 26 mg/dL (ref 0–40)

## 2023-07-14 LAB — AMMONIA: Ammonia: <13 umol/L (ref 9–35)

## 2023-07-14 LAB — PROTIME-INR
INR: 1.1 (ref 0.8–1.2)
Prothrombin Time: 14.4 s (ref 11.4–15.2)

## 2023-07-14 LAB — COMPREHENSIVE METABOLIC PANEL WITH GFR
ALT: 14 U/L (ref 0–44)
AST: 25 U/L (ref 15–41)
Albumin: 4.4 g/dL (ref 3.5–5.0)
Alkaline Phosphatase: 69 U/L (ref 38–126)
Anion gap: 15 (ref 5–15)
BUN: 27 mg/dL — ABNORMAL HIGH (ref 8–23)
CO2: 18 mmol/L — ABNORMAL LOW (ref 22–32)
Calcium: 9.5 mg/dL (ref 8.9–10.3)
Chloride: 106 mmol/L (ref 98–111)
Creatinine, Ser: 2.33 mg/dL — ABNORMAL HIGH (ref 0.61–1.24)
GFR, Estimated: 30 mL/min — ABNORMAL LOW (ref >60)
Glucose, Bld: 119 mg/dL — ABNORMAL HIGH (ref 70–99)
Potassium: 3.7 mmol/L (ref 3.5–5.1)
Sodium: 139 mmol/L (ref 135–145)
Total Bilirubin: 0.9 mg/dL (ref 0.0–1.2)
Total Protein: 7.6 g/dL (ref 6.5–8.1)

## 2023-07-14 LAB — URINALYSIS, W/ REFLEX TO CULTURE (INFECTION SUSPECTED)
Bilirubin Urine: NEGATIVE
Glucose, UA: NEGATIVE mg/dL
Ketones, ur: NEGATIVE mg/dL
Leukocytes,Ua: NEGATIVE
Nitrite: NEGATIVE
Protein, ur: 30 mg/dL — AB
Specific Gravity, Urine: 1.015 (ref 1.005–1.030)
pH: 6 (ref 5.0–8.0)

## 2023-07-14 LAB — GLUCOSE, CAPILLARY: Glucose-Capillary: 109 mg/dL — ABNORMAL HIGH (ref 70–99)

## 2023-07-14 LAB — I-STAT CG4 LACTIC ACID, ED
Lactic Acid, Venous: 3.5 mmol/L (ref 0.5–1.9)
Lactic Acid, Venous: 1.1 mmol/L (ref 0.5–1.9)

## 2023-07-14 LAB — TSH: TSH: 4.268 u[IU]/mL (ref 0.350–4.500)

## 2023-07-14 LAB — VITAMIN B12: Vitamin B-12: 238 pg/mL (ref 180–914)

## 2023-07-14 LAB — RESP PANEL BY RT-PCR (RSV, FLU A&B, COVID)  RVPGX2
Influenza A by PCR: NEGATIVE
Influenza B by PCR: NEGATIVE
Resp Syncytial Virus by PCR: NEGATIVE
SARS Coronavirus 2 by RT PCR: NEGATIVE

## 2023-07-14 LAB — D-DIMER, QUANTITATIVE: D-Dimer, Quant: 0.35 ug{FEU}/mL (ref 0.00–0.50)

## 2023-07-14 MED ORDER — OXYBUTYNIN CHLORIDE 5 MG PO TABS
5.0000 mg | ORAL_TABLET | Freq: Every day | ORAL | Status: DC
Start: 2023-07-14 — End: 2023-07-15
  Administered 2023-07-14: 5 mg via ORAL
  Filled 2023-07-14 (×2): qty 1

## 2023-07-14 MED ORDER — HEPARIN SODIUM (PORCINE) 5000 UNIT/ML IJ SOLN
5000.0000 [IU] | Freq: Three times a day (TID) | INTRAMUSCULAR | Status: DC
Start: 2023-07-14 — End: 2023-07-15
  Administered 2023-07-14 – 2023-07-15 (×3): 5000 [IU] via SUBCUTANEOUS
  Filled 2023-07-14 (×3): qty 1

## 2023-07-14 MED ORDER — SODIUM CHLORIDE 0.9 % IV BOLUS
1000.0000 mL | Freq: Once | INTRAVENOUS | Status: AC
  Administered 2023-07-14: 1000 mL via INTRAVENOUS

## 2023-07-14 MED ORDER — VALPROATE SODIUM 100 MG/ML IV SOLN
635.0000 mg | Freq: Once | INTRAVENOUS | Status: AC
  Administered 2023-07-14: 635 mg via INTRAVENOUS
  Filled 2023-07-14: qty 6.35

## 2023-07-14 MED ORDER — LORAZEPAM 2 MG/ML IJ SOLN
1.0000 mg | Freq: Once | INTRAMUSCULAR | Status: AC
Start: 2023-07-14 — End: 2023-07-14
  Administered 2023-07-14: 1 mg via INTRAVENOUS
  Filled 2023-07-14: qty 1

## 2023-07-14 MED ORDER — PANTOPRAZOLE SODIUM 40 MG PO TBEC
40.0000 mg | DELAYED_RELEASE_TABLET | Freq: Every day | ORAL | Status: DC
  Administered 2023-07-14: 40 mg via ORAL
  Filled 2023-07-14: qty 1

## 2023-07-14 MED ORDER — FINASTERIDE 5 MG PO TABS
5.0000 mg | ORAL_TABLET | Freq: Every day | ORAL | Status: DC
  Administered 2023-07-14 – 2023-07-15 (×2): 5 mg via ORAL
  Filled 2023-07-14 (×2): qty 1

## 2023-07-14 MED ORDER — DIVALPROEX SODIUM 500 MG PO DR TAB: 500.0000 mg | DELAYED_RELEASE_TABLET | Freq: Three times a day (TID) | ORAL | Status: DC

## 2023-07-14 MED ORDER — SODIUM CHLORIDE 0.9% FLUSH
3.0000 mL | Freq: Two times a day (BID) | INTRAVENOUS | Status: DC
  Administered 2023-07-14 – 2023-07-15 (×3): 3 mL via INTRAVENOUS

## 2023-07-14 MED ORDER — ACETAMINOPHEN 650 MG RE SUPP: 650.0000 mg | Freq: Four times a day (QID) | RECTAL | Status: DC | PRN

## 2023-07-14 MED ORDER — THIAMINE HCL 100 MG/ML IJ SOLN
500.0000 mg | Freq: Once | INTRAVENOUS | Status: AC
  Administered 2023-07-14: 500 mg via INTRAVENOUS
  Filled 2023-07-14: qty 500

## 2023-07-14 MED ORDER — ACETAMINOPHEN 325 MG PO TABS: 650.0000 mg | ORAL_TABLET | Freq: Four times a day (QID) | ORAL | Status: DC | PRN

## 2023-07-14 MED ORDER — LACTATED RINGERS IV SOLN: INTRAVENOUS | Status: DC

## 2023-07-14 MED ORDER — DIVALPROEX SODIUM 500 MG PO DR TAB
500.0000 mg | DELAYED_RELEASE_TABLET | Freq: Two times a day (BID) | ORAL | Status: DC
  Administered 2023-07-15: 500 mg via ORAL
  Filled 2023-07-14 (×2): qty 1

## 2023-07-14 NOTE — ED Provider Notes (Signed)
 [place] EMERGENCY DEPARTMENT AT [facility] Provider Note   CSN: [id number] Arrival date & time: 07/14/23  9347     Patient presents with: Altered Mental Status   [name] is a 68 y.o. male patient with history of chronic kidney disease, CVA, hypertension who presents to the emergency department today for further evaluation of altered mental status.  Per EMS patient woke up around 5:30 AM screaming and altered per the wife.  He went to bed normal around 11 PM.  He was complaining of chills last night.  Due to the patient's mental status difficult to obtain rest of history.     Altered Mental Status      Prior to Admission medications   Medication Sig Start Date End Date Taking? Authorizing Provider  amoxicillin  (AMOXIL ) 500 MG capsule Take 4 capsules (2,000 mg total) by mouth as directed. 4 pills one hour before dental appointment 11/15/20  Yes Okey Vina GAILS, MD  buPROPion  (WELLBUTRIN  XL) 150 MG 24 hr tablet Take 150 mg by mouth daily.   Yes [provider]  diphenhydramine -acetaminophen  (TYLENOL  PM) 25-500 MG TABS tablet Take 2 tablets by mouth at bedtime.   Yes [provider]  finasteride  (PROSCAR ) 5 MG tablet Take 5 mg by mouth daily. 12/02/22  Yes [provider]  ketoconazole (NIZORAL) 2 % shampoo Apply 1 Application topically 2 (two) times a week.   Yes [provider]  losartan  (COZAAR ) 50 MG tablet Take 1 tablet (50 mg total) by mouth daily. 10/30/22  Yes Okey Vina GAILS, MD  OVER THE COUNTER MEDICATION Take 1-2 capsules by mouth See admin instructions. CBD gummies, Take 2 gummies at bedtime, may take 1 gummy in the middle of the night as needed for sleep   Yes [provider]  oxybutynin  (DITROPAN ) 5 MG tablet Take 5 mg by mouth at bedtime. 01/16/23  Yes [provider]  pantoprazole  (PROTONIX ) 40 MG tablet Take 40 mg by mouth at bedtime. 03/07/22  Yes [provider]  tadalafil (CIALIS) 20 MG tablet  Take 20 mg by mouth daily as needed for erectile dysfunction.   Yes [provider]  traMADol  (ULTRAM ) 50 MG tablet Take 50 mg by mouth every 6 (six) hours as needed. Patient not taking: Reported on 07/14/2023 01/31/23   [provider]    Allergies: Patient has no known allergies.    Review of Systems  All other systems reviewed and are negative.   Updated Vital Signs BP (!) 151/103   Pulse 82   Temp 98.8 F (37.1 C) (Rectal)   Resp 10   Ht 5' 8 (1.727 m)   Wt 84.7 kg   SpO2 99%   BMI 28.39 kg/m   Physical Exam Vitals and nursing note reviewed.  Constitutional:      General: He is not in acute distress.    Appearance: Normal appearance.  HENT:     Head: Normocephalic and atraumatic.  Eyes:     General:        Right eye: No discharge.        Left eye: No discharge.  Cardiovascular:     Rate and Rhythm: Tachycardia present.     Comments: Radial pulses are 2+ bilaterally.  Dorsalis pedis pulses are 2+ bilaterally.  No evidence of pedal edema. Pulmonary:     Comments: Clear to auscultation bilaterally.  Normal effort.  No respiratory distress.  No evidence of wheezes, rales, or rhonchi heard throughout. Abdominal:  General: Abdomen is flat. Bowel sounds are normal. There is no distension.     Tenderness: There is no abdominal tenderness. There is no guarding or rebound.  Musculoskeletal:        General: Normal range of motion.     Cervical back: Neck supple.  Skin:    General: Skin is warm and dry.     Findings: No rash.  Neurological:     General: No focal deficit present.     Mental Status: He is confused.     Comments: Moves all 4 limbs spontaneously without any clear weakness.  Psychiatric:        Mood and Affect: Mood normal.        Behavior: Behavior normal.     (all labs ordered are listed, but only abnormal results are displayed) Labs Reviewed  COMPREHENSIVE METABOLIC PANEL WITH GFR - Abnormal; Notable for the following components:       Result Value   CO2 18 (*)    Glucose, Bld 119 (*)    BUN 27 (*)    Creatinine, Ser 2.33 (*)    GFR, Estimated 30 (*)    All other components within normal limits  CBC WITH DIFFERENTIAL/PLATELET - Abnormal; Notable for the following components:   WBC 11.1 (*)    Neutro Abs 8.7 (*)    Monocytes Absolute 1.3 (*)    Abs Immature Granulocytes 0.11 (*)    All other components within normal limits  URINALYSIS, W/ REFLEX TO CULTURE (INFECTION SUSPECTED) - Abnormal; Notable for the following components:   Hgb urine dipstick SMALL (*)    Protein, ur 30 (*)    Bacteria, UA RARE (*)    All other components within normal limits  I-STAT CG4 LACTIC ACID, ED - Abnormal; Notable for the following components:   Lactic Acid, Venous 3.5 (*)    All other components within normal limits  TROPONIN I (HIGH SENSITIVITY) - Abnormal; Notable for the following components:   Troponin I (High Sensitivity) 21 (*)    All other components within normal limits  RESP PANEL BY RT-PCR (RSV, FLU A&B, COVID)  RVPGX2  CULTURE, BLOOD (ROUTINE X 2)  CULTURE, BLOOD (ROUTINE X 2)  PROTIME-INR  AMMONIA  RAPID URINE DRUG SCREEN, HOSP PERFORMED  CK  MAGNESIUM   D-DIMER, QUANTITATIVE  ETHANOL  PROLACTIN  VITAMIN B12  T4, FREE  TSH  HIV ANTIBODY (ROUTINE TESTING W REFLEX)  LIPID PANEL  I-STAT CG4 LACTIC ACID, ED  TROPONIN I (HIGH SENSITIVITY)    EKG: None  Radiology: CT Head Wo Contrast Result Date: 07/14/2023 EXAM: CT HEAD WITHOUT 07/14/2023 12:34:00 PM TECHNIQUE: CT of the head was performed without the administration of intravenous contrast. Automated exposure control, iterative reconstruction, and/or weight based adjustment of the mA/kV was utilized to reduce the radiation dose to as low as reasonably achievable. COMPARISON: MRI head without and with contrast 02/12/2023. CT head without contrast 06/16/2017. CLINICAL HISTORY: Altered mental status, nontraumatic (Ped 0-17y). FINDINGS: BRAIN AND VENTRICLES: No  acute intracranial hemorrhage. No mass effect or midline shift. No extra-axial fluid collection. Gray-white differentiation is maintained. A remote lacunar infarct is again noted in the left thalamus. A remote hemorrhagic infarct is present in the right external capsule. Mild ex vacuo dilation of the right lateral ventricle is noted. ORBITS: No acute abnormality. SINUSES AND MASTOIDS: No acute abnormality. SOFT TISSUES AND SKULL: No acute skull fracture. No acute soft tissue abnormality. IMPRESSION: 1. No acute intracranial abnormality. 2. Remote lacunar infarcts in the  left thalamus and remote hemorrhagic infarct in the right external capsule. 3. Mild ex vacuo dilation of the right lateral ventricle. Electronically signed by: Lonni Necessary MD 07/14/2023 12:52 PM EDT RP Workstation: [id number]   DG Chest Portable 1 View Result Date: 07/14/2023 CLINICAL DATA:  Altered mental status.  History of cancer. EXAM: PORTABLE CHEST 1 VIEW COMPARISON:  PA and lateral chest 01/15/2023 FINDINGS: There is mild cardiomegaly, with again noted metallic MVR. The mediastinum is normally outlined. No vascular congestion is seen. The lungs are clear. Stable mild dextroscoliosis and degenerative change thoracic spine. IMPRESSION: No evidence of acute chest disease. Mild cardiomegaly. Electronically Signed   By: Francis Quam M.D.   On: 07/14/2023 07:54     .Critical Care  Performed by: Theotis Cameron HERO, PA-C Authorized by: Theotis Cameron HERO, PA-C   Critical care provider statement:    Critical care time (minutes):  35   Critical care time was exclusive of:  Separately billable procedures and treating other patients   Critical care was necessary to treat or prevent imminent or life-threatening deterioration of the following conditions:  CNS failure or compromise   Critical care was time spent personally by me on the following activities:  Blood draw for specimens, development of treatment plan with patient or  surrogate, discussions with consultants, discussions with primary provider, ordering and performing treatments and interventions, ordering and review of laboratory studies, ordering and review of radiographic studies, pulse oximetry and re-evaluation of patient's condition    Medications Ordered in the ED  thiamine  (VITAMIN B1) 500 mg in sodium chloride  0.9 % 50 mL IVPB (has no administration in time range)  sodium chloride  flush (NS) 0.9 % injection 3 mL (3 mLs Intravenous Given 07/14/23 1430)  lactated ringers  infusion ( Intravenous New Bag/Given 07/14/23 1430)  acetaminophen  (TYLENOL ) tablet 650 mg (has no administration in time range)    Or  acetaminophen  (TYLENOL ) suppository 650 mg (has no administration in time range)  heparin  injection 5,000 Units (has no administration in time range)  sodium chloride  0.9 % bolus 1,000 mL (0 mLs Intravenous Stopped 07/14/23 1142)  LORazepam  (ATIVAN ) injection 1 mg (1 mg Intravenous Given 07/14/23 0726)    Clinical Course as of 07/14/23 1430  Sun Jul 14, 2023  0736 I spoke with the wife at bedside.  She states that they were at the pool yesterday and he was overall feeling well but was complaining of some chills all day.  Patient woke up this morning screaming around 5 to 5:30 AM both of his arms were up and he was altered.  Patient does not drink alcohol  on a regular basis nor does he use any illicit drugs.  However, patient does take CBD Gummies before bed to help with sleep. [CF]  1210 On reevaluation, patient is reading his Kindle.  He is completely back to baseline per wife. [CF]  1329 Shared decision-making was done about admission.  We answered all questions about the patient's labs and imaging results today.  We talked about the overall risks of leaving and going home.  Patient and wife are in agreement that the patient needs to be admitted for observation. [CF]  1403 I spoke with Dr. Matthews with neurology who agrees to consult on the patient. [CF]   1428 Troponin I (High Sensitivity)(!)  initial troponin is normal.  Delta troponin is slightly elevated.  Patient does not have any chest pain. [CF]  1428 CBC with Differential(!) Slight leukocytosis. [CF]  1428 Comprehensive metabolic panel(!) Increased  creatinine indicative of AKI.  No lecture light derangements [CF]  1429 Culture, blood (Routine x 2) Blood cultures are obtained and pending. [CF]  1429 Protime-INR Negative. [CF]  1429 Urinalysis, w/ Reflex to Culture (Infection Suspected) -Urine, Clean Catch(!) No signs of infection. [CF]  1429 Ammonia Negative. [CF]  1429 Resp panel by RT-PCR (RSV, Flu A&B, Covid) Anterior Nasal Swab Negative. [CF]  1429 DG Chest Portable 1 View No signs of pneumonia.  I do agree with the radiologist interpretation. [CF]  1430 CT Head Wo Contrast Personally ordered interpreted study.  No evidence of intracranial hemorrhage.  I do agree with radiologist interpretation. [CF]    Clinical Course User Index [CF] Theotis Cameron HERO, PA-C    Medical Decision Making Rucker Pridgeon is a 68 y.o. male patient who presents to the emergency department today for further evaluation of altered mental status.  Patient is tachycardic and altered.  After we performed a rectal temperature patient became more talkative but does not know why he is here.  He does state that he has no somatic complaints at this time.  Will scan his head, get sepsis labs, give him fluids.  Patient is now completely back to baseline.  There is evidence of a leukocytosis.  No signs of electrolyte derangements at this time.  CT head is clean.  No signs of intracranial hemorrhage.  I do feel the patient would likely need to be admitted.  Questionable whether or not the patient had a seizure.  Patient does not state that he has a seizure disorder nor has he ever had seizures in the past.  He also states he does not drink.  I spoke with Dr. Matthews with neurology who agrees to consult on the patient.   I will admit him to the hospitalist service.  Amount and/or Complexity of Data Reviewed Labs: ordered. Decision-making details documented in ED Course. Radiology: ordered.  Risk Prescription drug management. Decision regarding hospitalization.     Final diagnoses:  Altered mental status, unspecified altered mental status type    ED Discharge Orders     None          Theotis Cameron HERO, [place] 07/14/23 1430    Laurice Maude BROCKS, MD 07/14/23 1559

## 2023-07-14 NOTE — Evaluation (Signed)
 Clinical/Bedside Swallow Evaluation Patient Details  Name: [name] MRN: [id number] Date of Birth: [birth year]  Today's Date: 07/14/2023 Time: SLP Start Time (ACUTE ONLY): 1443 SLP Stop Time (ACUTE ONLY): 1454 SLP Time Calculation (min) (ACUTE ONLY): 11 min  Past Medical History:  Past Medical History:  Diagnosis Date   Bacteremia 110/2017   Cancer (HCC)    renal mass at preop   Chronic kidney disease    CVA (cerebral infarction)    hemmorhagic  CVA   Depression    Dyslipidemia    Endocarditis    MV Vegitation   GERD (gastroesophageal reflux disease)    History of kidney stones    Hypertension    Mitral regurgitation    MRSA infection    2011   Myocardial infarction [facility])    Stroke [facility])    Past Surgical History:  Past Surgical History:  Procedure Laterality Date   CARDIAC CATHETERIZATION     CARDIOVERSION N/A 09/14/2020   Procedure: CARDIOVERSION;  Surgeon: Loni Soyla LABOR, MD;  Location: [facility] ENDOSCOPY;  Service: Cardiovascular;  Laterality: N/A;   MITRAL VALVE REPAIR     NO PAST SURGERIES     RIGHT/LEFT HEART CATH AND CORONARY ANGIOGRAPHY N/A 05/11/2020   Procedure: RIGHT/LEFT HEART CATH AND CORONARY ANGIOGRAPHY;  Surgeon: Wonda Sharper, MD;  Location: [facility] INVASIVE CV LAB;  Service: Cardiovascular;  Laterality: N/A;   ROBOT ASSISTED LAPAROSCOPIC NEPHRECTOMY Right 02/22/2016   Procedure: XI ROBOTIC ASSISTED LAPAROSCOPIC NEPHRECTOMY;  Surgeon: Ricardo Likens, MD;  Location: [facility] ORS;  Service: Urology;  Laterality: Right;   ROBOTIC ASSITED PARTIAL NEPHRECTOMY Left 06/07/2021   Procedure: XI ROBOTIC ASSITED PARTIAL NEPHRECTOMY;  Surgeon: Likens Ricardo, MD;  Location: [facility] ORS;  Service: Urology;  Laterality: Left;  3 HRS   TEE WITHOUT CARDIOVERSION N/A 01/28/2020   Procedure: TRANSESOPHAGEAL ECHOCARDIOGRAM (TEE);  Surgeon: Okey Vina GAILS, MD;  Location: [facility] ENDOSCOPY;  Service: Cardiovascular;  Laterality: N/A;   TONSILLECTOMY     TRANSURETHRAL RESECTION OF PROSTATE N/A  03/28/2022   Procedure: TRANSURETHRAL RESECTION OF THE PROSTATE (TURP);  Surgeon: Likens Ricardo, MD;  Location: [facility] ORS;  Service: Urology;  Laterality: N/A;   HPI:  [name] is a 68 yo male presenting to ED 7/13 with delirium. Per MD note, pt's wife states that he woke up with both his UE straight up and was altered. Seen previously by SLP in 2017 for cognition and swallowing but ultimately discharged home on a regular diet. PMH includes asthma, HTN, bacterial endocarditis 2010, MI, CVA, mitral valve repair August 2022, A-fib on Eliquis , bilateral renal cell cancer s/p nephrectomy    Assessment / Plan / Recommendation  Clinical Impression  Pt denies difficulty swallowing since last being seen by SLP in 2017. He fed himself regular solids and thin liquids without overt s/s of dypshagia or aspiration. Will leave current diet in place and sign off. SLP Visit Diagnosis: Dysphagia, unspecified (R13.10)    Aspiration Risk  Mild aspiration risk    Diet Recommendation Regular;Thin liquid    Liquid Administration via: Cup;Straw Medication Administration: Whole meds with liquid Supervision: Patient able to self feed Compensations: Minimize environmental distractions;Slow rate;Small sips/bites Postural Changes: Seated upright at 90 degrees    Other  Recommendations Oral Care Recommendations: Oral care BID     Assistance Recommended at Discharge    Functional Status Assessment Patient has not had a recent decline in their functional status  Frequency and Duration            Prognosis Prognosis  for improved oropharyngeal function: Good Barriers to Reach Goals: Cognitive deficits      Swallow Study   General HPI: [name] is a 68 yo male presenting to ED 7/13 with delirium. Per MD note, pt's wife states that he woke up with both his UE straight up and was altered. Seen previously by SLP in 2017 for cognition and swallowing but ultimately discharged home on a regular diet. PMH  includes asthma, HTN, bacterial endocarditis 2010, MI, CVA, mitral valve repair August 2022, A-fib on Eliquis , bilateral renal cell cancer s/p nephrectomy Type of Study: Bedside Swallow Evaluation Previous Swallow Assessment: see HPI Diet Prior to this Study: Regular;Thin liquids (Level 0) Temperature Spikes Noted: No Respiratory Status: Room air History of Recent Intubation: No Behavior/Cognition: Alert;Cooperative;Pleasant mood Oral Cavity Assessment: Within Functional Limits Oral Care Completed by SLP: No Oral Cavity - Dentition: Adequate natural dentition Vision: Functional for self-feeding Self-Feeding Abilities: Able to feed self Patient Positioning: Upright in bed Baseline Vocal Quality: Normal Volitional Cough: Strong Volitional Swallow: Able to elicit    Oral/Motor/Sensory Function Overall Oral Motor/Sensory Function: Within functional limits   Ice Chips Ice chips: Not tested   Thin Liquid Thin Liquid: Within functional limits Presentation: Straw;Self Fed    Nectar Thick Nectar Thick Liquid: Not tested   Honey Thick Honey Thick Liquid: Not tested   Puree Puree: Not tested   Solid     Solid: Within functional limits Presentation: Self Fed      Damien Blumenthal, M.A., CCC-SLP Speech Language Pathology, Acute Rehabilitation Services  Secure Chat preferred [phone]  07/14/2023,3:03 PM

## 2023-07-14 NOTE — H&P (Addendum)
 History and Physical    Patient: [name] FMW:[id number] DOB: September 10, 1955 DOA: 07/14/2023 DOS: the patient was seen and examined on 07/14/2023 . PCP: [facility]  Patient coming from: Home Chief complaint: Chief Complaint  Patient presents with   Altered Mental Status   HPI:  [name] is a 68 y.o. male with past medical history  of asthma, hypertension,  bacterial endocarditis in 2010, MI, CVA,, mitral valve repair in August 2022 at [facility], A-fib on Eliquis  and amiodarone which was later stopped after cardioversion, bilateral renal cell cancer status post nephrectomy complete and partial presenting today with delirium.  Patient at bedside does not know what happened but wife told him that around 5:30 in the morning he woke up has his both UE straight up pointing up and  raised and was screaming and altered per his wife and last known normal was around 11 last night he did have chills to me patient does not complain of anything states that he just does not feel well since the stroke.  And no reports of chest pain palpitation shortness of breath or any other complaints.  Patient was confused in the emergency room and has returned to baseline wife was here earlier but has now gone home.   ED Course:  Vital signs in the ED were notable for the following:  Vitals:   07/14/23 0800 07/14/23 0815 07/14/23 0830 07/14/23 1200  BP: (!) 150/104 (!) 152/108 (!) 174/98 (!) 151/103  Pulse: 98 91 90 82  Temp:      Resp: 14 13 13 10   Height:      Weight:      SpO2: 96% 92% 100% 99%  TempSrc:      BMI (Calculated):      >>ED evaluation thus far shows: CMP shows CKD stage IIIb with a creatinine of 2.3 EGFR of 30, bicarb of 18 otherwise normal LFTs and electrolytes. Lactic acidosis of 3.5 with a repeat at 1.1. Ammonia less than 13. Troponin of 21. EKG shows sinus tach at 118 PR 133 QRS 155 QTc of 466 patient has PVCs, QRS of 154, right bundle branch block with RSR pattern  in V1.  >>While in the ED patient received the following: Medications  thiamine  (VITAMIN B1) 500 mg in sodium chloride  0.9 % 50 mL IVPB (has no administration in time range)  sodium chloride  flush (NS) 0.9 % injection 3 mL (has no administration in time range)  lactated ringers  infusion (has no administration in time range)  acetaminophen  (TYLENOL ) tablet 650 mg (has no administration in time range)    Or  acetaminophen  (TYLENOL ) suppository 650 mg (has no administration in time range)  heparin  injection 5,000 Units (has no administration in time range)  sodium chloride  0.9 % bolus 1,000 mL (0 mLs Intravenous Stopped 07/14/23 1142)  LORazepam  (ATIVAN ) injection 1 mg (1 mg Intravenous Given 07/14/23 0726)   Review of Systems  Neurological:        Baseline left facial droop and right hand weakness from previous stroke.  Psychiatric/Behavioral:         Delirium    Past Medical History:  Diagnosis Date   Bacteremia 110/2017   Cancer (HCC)    renal mass at preop   Chronic kidney disease    CVA (cerebral infarction)    hemmorhagic  CVA   Depression    Dyslipidemia    Endocarditis    MV Vegitation   GERD (gastroesophageal reflux disease)    History of  kidney stones    Hypertension    Mitral regurgitation    MRSA infection    2011   Myocardial infarction [facility])    Stroke [facility])    Past Surgical History:  Procedure Laterality Date   CARDIAC CATHETERIZATION     CARDIOVERSION N/A 09/14/2020   Procedure: CARDIOVERSION;  Surgeon: Loni Soyla LABOR, MD;  Location: [facility] ENDOSCOPY;  Service: Cardiovascular;  Laterality: N/A;   MITRAL VALVE REPAIR     NO PAST SURGERIES     RIGHT/LEFT HEART CATH AND CORONARY ANGIOGRAPHY N/A 05/11/2020   Procedure: RIGHT/LEFT HEART CATH AND CORONARY ANGIOGRAPHY;  Surgeon: Wonda Sharper, MD;  Location: [facility] INVASIVE CV LAB;  Service: Cardiovascular;  Laterality: N/A;   ROBOT ASSISTED LAPAROSCOPIC NEPHRECTOMY Right 02/22/2016   Procedure: XI ROBOTIC ASSISTED  LAPAROSCOPIC NEPHRECTOMY;  Surgeon: Ricardo Likens, MD;  Location: [facility] ORS;  Service: Urology;  Laterality: Right;   ROBOTIC ASSITED PARTIAL NEPHRECTOMY Left 06/07/2021   Procedure: XI ROBOTIC ASSITED PARTIAL NEPHRECTOMY;  Surgeon: Likens Ricardo, MD;  Location: [facility] ORS;  Service: Urology;  Laterality: Left;  3 HRS   TEE WITHOUT CARDIOVERSION N/A 01/28/2020   Procedure: TRANSESOPHAGEAL ECHOCARDIOGRAM (TEE);  Surgeon: Okey Vina GAILS, MD;  Location: [facility] ENDOSCOPY;  Service: Cardiovascular;  Laterality: N/A;   TONSILLECTOMY     TRANSURETHRAL RESECTION OF PROSTATE N/A 03/28/2022   Procedure: TRANSURETHRAL RESECTION OF THE PROSTATE (TURP);  Surgeon: Likens Ricardo, MD;  Location: [facility] ORS;  Service: Urology;  Laterality: N/A;    reports that he has never smoked. He has never used smokeless tobacco. He reports current alcohol  use of about 4.0 - 5.0 standard drinks of alcohol  per week. He reports current drug use. Drug: Marijuana. No Known Allergies Family History  Problem Relation Age of Onset   Heart disease Mother        mom RF as a child (died of heart problem in  105 )   Prior to Admission medications   Medication Sig Start Date End Date Taking? Authorizing Provider  acetaminophen  (TYLENOL ) 500 MG tablet Take 1,000 mg by mouth every 6 (six) hours as needed for moderate pain.    [provider]  amoxicillin  (AMOXIL ) 500 MG capsule Take 4 capsules (2,000 mg total) by mouth as directed. 4 pills one hour before dental appointment 11/15/20   Okey Vina GAILS, MD  buPROPion  (WELLBUTRIN  XL) 150 MG 24 hr tablet Take 150 mg by mouth daily.    [provider]  diphenhydramine -acetaminophen  (TYLENOL  PM) 25-500 MG TABS tablet Take 2 tablets by mouth at bedtime.    [provider]  finasteride  (PROSCAR ) 5 MG tablet Take 5 mg by mouth daily. 12/02/22   [provider]  losartan  (COZAAR ) 50 MG tablet Take 1 tablet (50 mg total) by mouth daily. 10/30/22   Okey Vina GAILS, MD  OVER THE  COUNTER MEDICATION Take 1-3 capsules by mouth See admin instructions. CBD gummies, Take 3 gummies at bedtime, may take 1 gummy in the middle of the night as needed for sleep    [provider]  oxybutynin  (DITROPAN ) 5 MG tablet Take 5 mg by mouth at bedtime. 01/16/23   [provider]  pantoprazole  (PROTONIX ) 40 MG tablet Take 40 mg by mouth daily as needed (acid reflux). 03/07/22   [provider]  tadalafil (CIALIS) 20 MG tablet Take 20 mg by mouth daily as needed for erectile dysfunction.    [provider]  traMADol  (ULTRAM ) 50 MG tablet Take 50 mg by mouth every 6 (  six) hours as needed. 01/31/23   [provider]                                                                                 Vitals:   07/14/23 0800 07/14/23 0815 07/14/23 0830 07/14/23 1200  BP: (!) 150/104 (!) 152/108 (!) 174/98 (!) 151/103  Pulse: 98 91 90 82  Resp: 14 13 13 10   Temp:      TempSrc:      SpO2: 96% 92% 100% 99%  Weight:      Height:       Physical Exam Vitals reviewed.  Constitutional:      General: He is not in acute distress.    Appearance: He is not ill-appearing.  HENT:     Head: Normocephalic and atraumatic.  Eyes:     Extraocular Movements: Extraocular movements intact.     Pupils: Pupils are equal, round, and reactive to light.  Cardiovascular:     Rate and Rhythm: Normal rate and regular rhythm.     Pulses: Normal pulses.     Heart sounds: Normal heart sounds.  Pulmonary:     Effort: Pulmonary effort is normal.     Breath sounds: Normal breath sounds.  Abdominal:     General: There is no distension.     Palpations: Abdomen is soft.     Tenderness: There is no abdominal tenderness.  Musculoskeletal:     Right lower leg: No edema.     Left lower leg: No edema.  Neurological:     General: No focal deficit present.     Mental Status: He is alert and oriented to person, place, and time. Mental status is at baseline.     Motor: Weakness  present.  Psychiatric:        Mood and Affect: Mood normal.        Behavior: Behavior normal.     Labs on Admission: I have personally reviewed following labs and imaging studies CBC: Recent Labs  Lab 07/14/23 0654  WBC 11.1*  NEUTROABS 8.7*  HGB 14.6  HCT 43.2  MCV 89.8  PLT 208   Basic Metabolic Panel: Recent Labs  Lab 07/14/23 0654  NA 139  K 3.7  CL 106  CO2 18*  GLUCOSE 119*  BUN 27*  CREATININE 2.33*  CALCIUM  9.5   GFR: Estimated Creatinine Clearance: 33 mL/min (A) (by C-G formula based on SCr of 2.33 mg/dL (H)). Liver Function Tests: Recent Labs  Lab 07/14/23 0654  AST 25  ALT 14  ALKPHOS 69  BILITOT 0.9  PROT 7.6  ALBUMIN 4.4   No results for input(s): LIPASE, AMYLASE in the last 168 hours. Recent Labs  Lab 07/14/23 1157  AMMONIA <13   Coagulation Profile: Recent Labs  Lab 07/14/23 0654  INR 1.1   Cardiac Enzymes: No results for input(s): CKTOTAL, CKMB, CKMBINDEX, TROPONINI in the last 168 hours. BNP (last 3 results) No results for input(s): PROBNP in the last 8760 hours. HbA1C: No results for input(s): HGBA1C in the last 72 hours. CBG: No results for input(s): GLUCAP in the last 168 hours. Lipid Profile: No results for input(s): CHOL, HDL, LDLCALC, TRIG, CHOLHDL, LDLDIRECT  in the last 72 hours. Thyroid  Function Tests: No results for input(s): TSH, T4TOTAL, FREET4, T3FREE, THYROIDAB in the last 72 hours. Anemia Panel: No results for input(s): VITAMINB12, FOLATE, FERRITIN, TIBC, IRON, RETICCTPCT in the last 72 hours. Urine analysis:    Component Value Date/Time   COLORURINE YELLOW 07/14/2023 0654   APPEARANCEUR CLEAR 07/14/2023 0654   LABSPEC 1.015 07/14/2023 0654   PHURINE 6.0 07/14/2023 0654   GLUCOSEU NEGATIVE 07/14/2023 0654   HGBUR SMALL (A) 07/14/2023 0654   BILIRUBINUR NEGATIVE 07/14/2023 0654   KETONESUR NEGATIVE 07/14/2023 0654   PROTEINUR 30 (A) 07/14/2023 0654    UROBILINOGEN 0.2 03/15/2008 1023   NITRITE NEGATIVE 07/14/2023 0654   LEUKOCYTESUR NEGATIVE 07/14/2023 0654   Radiological Exams on Admission: CT Head Wo Contrast Result Date: 07/14/2023 EXAM: CT HEAD WITHOUT 07/14/2023 12:34:00 PM TECHNIQUE: CT of the head was performed without the administration of intravenous contrast. Automated exposure control, iterative reconstruction, and/or weight based adjustment of the mA/kV was utilized to reduce the radiation dose to as low as reasonably achievable. COMPARISON: MRI head without and with contrast 02/12/2023. CT head without contrast 06/16/2017. CLINICAL HISTORY: Altered mental status, nontraumatic (Ped 0-17y). FINDINGS: BRAIN AND VENTRICLES: No acute intracranial hemorrhage. No mass effect or midline shift. No extra-axial fluid collection. Gray-white differentiation is maintained. A remote lacunar infarct is again noted in the left thalamus. A remote hemorrhagic infarct is present in the right external capsule. Mild ex vacuo dilation of the right lateral ventricle is noted. ORBITS: No acute abnormality. SINUSES AND MASTOIDS: No acute abnormality. SOFT TISSUES AND SKULL: No acute skull fracture. No acute soft tissue abnormality. IMPRESSION: 1. No acute intracranial abnormality. 2. Remote lacunar infarcts in the left thalamus and remote hemorrhagic infarct in the right external capsule. 3. Mild ex vacuo dilation of the right lateral ventricle. Electronically signed by: Lonni Necessary MD 07/14/2023 12:52 PM EDT RP Workstation: [id number]   DG Chest Portable 1 View Result Date: 07/14/2023 CLINICAL DATA:  Altered mental status.  History of cancer. EXAM: PORTABLE CHEST 1 VIEW COMPARISON:  PA and lateral chest 01/15/2023 FINDINGS: There is mild cardiomegaly, with again noted metallic MVR. The mediastinum is normally outlined. No vascular congestion is seen. The lungs are clear. Stable mild dextroscoliosis and degenerative change thoracic spine. IMPRESSION: No  evidence of acute chest disease. Mild cardiomegaly. Electronically Signed   By: Francis Quam M.D.   On: 07/14/2023 07:54   Data Reviewed: Relevant notes from primary care and specialist visits, past discharge summaries as available in EHR, including Care Everywhere . Prior diagnostic testing as pertinent to current admission diagnoses, Updated medications and problem lists for reconciliation .ED course, including vitals, labs, imaging, treatment and response to treatment,Triage notes, nursing and pharmacy notes and ED provider's notes.Notable results as noted in HPI.Discussed case with EDMD/ ED APP/ or Specialty MD on call and as needed.  Assessment & Plan  >> Delirium: Patient found to be screaming and called home to get additional details about duration of this episode with no answer at home with wife's number, was told by the nurse patient did not have any tongue biting or incontinence. Currently patient's mentation is at baseline and does not remember anything.  Differentials include CVA, seizure, metabolic encephalopathy.  We will admit patient to telemetry unit with continuous cardiac monitoring and evaluate with comprehensive blood work, neuroimaging and EEG. Aspiration and seizure precautions fall precautions hold BuSpar hold tramadol  discontinue CBD's discussed with patient about the same  >> Essential hypertension: Currently with will  keep blood pressure goal to be 140s and 150s as he has a history of aortic aneurysm permissive hypertension may not be optimal for the patient but will defer to neuro once MRI is resulted.   >>H/O CVA: 2ndary prevention with AP per neurology.   >> History of DVT Currently of anticoagulation.   >> Seizure disorder Seizure precaution aspiration precaution EEG. No AED in chart.     DVT prophylaxis:  scd's Consults:  Neurology.   Advance Care Planning:  DNR/DNI   Family Communication:  None  Disposition Plan:  Home. Severity of  Illness: The appropriate patient status for this patient is INPATIENT. Inpatient status is judged to be reasonable and necessary in order to provide the required intensity of service to ensure the patient's safety. The patient's presenting symptoms, physical exam findings, and initial radiographic and laboratory data in the context of their chronic comorbidities is felt to place them at high risk for further clinical deterioration. Furthermore, it is not anticipated that the patient will be medically stable for discharge from the hospital within 2 midnights of admission.   * I certify that at the point of admission it is my clinical judgment that the patient will require inpatient hospital care spanning beyond 2 midnights from the point of admission due to high intensity of service, high risk for further deterioration and high frequency of surveillance required.*  Unresulted Labs (From admission, onward)     Start     Ordered   07/14/23 1409  Lipid panel  Once,   R        07/14/23 1410   07/14/23 1407  Vitamin B12  Add-on,   AD        07/14/23 1406   07/14/23 1407  T4, free  Add-on,   AD        07/14/23 1406   07/14/23 1407  TSH  Add-on,   AD        07/14/23 1406   07/14/23 1407  HIV Antibody (routine testing w rflx)  (HIV Antibody (Routine testing w reflex) panel)  Once,   R        07/14/23 1410   07/14/23 1335  Prolactin  Add-on,   AD        07/14/23 1334   07/14/23 1334  CK  Once,   URGENT        07/14/23 1334   07/14/23 1334  Magnesium   Add-on,   AD        07/14/23 1334   07/14/23 1334  D-dimer, quantitative  Once,   STAT        07/14/23 1334   07/14/23 1334  Ethanol  Add-on,   AD        07/14/23 1334   07/14/23 0928  Rapid urine drug screen (hospital performed)  ONCE - STAT,   STAT        07/14/23 0927   07/14/23 0654  Culture, blood (Routine x 2)  BLOOD CULTURE X 2,   R      07/14/23 0654            Meds ordered this encounter  Medications   sodium chloride  0.9 % bolus  1,000 mL   LORazepam  (ATIVAN ) injection 1 mg   thiamine  (VITAMIN B1) 500 mg in sodium chloride  0.9 % 50 mL IVPB   sodium chloride  flush (NS) 0.9 % injection 3 mL   lactated ringers  infusion   OR Linked Order Group    acetaminophen  (TYLENOL ) tablet 650  mg    acetaminophen  (TYLENOL ) suppository 650 mg   heparin  injection 5,000 Units   finasteride  (PROSCAR ) tablet 5 mg   oxybutynin  (DITROPAN ) tablet 5 mg   pantoprazole  (PROTONIX ) EC tablet 40 mg     Orders Placed This Encounter  Procedures   Critical Care   Culture, blood (Routine x 2)   Resp panel by RT-PCR (RSV, Flu A&B, Covid) Anterior Nasal Swab   CT Head Wo Contrast   DG Chest Portable 1 View   MR BRAIN WO CONTRAST   Comprehensive metabolic panel   CBC with Differential   Protime-INR   Urinalysis, w/ Reflex to Culture (Infection Suspected) -Urine, Clean Catch   Rapid urine drug screen (hospital performed)   Ammonia   CK   Magnesium    D-dimer, quantitative   Ethanol   Prolactin   Vitamin B12   T4, free   TSH   HIV Antibody (routine testing w rflx)   Lipid panel   Diet Heart Room service appropriate? Yes; Fluid consistency: Thin   Notify physician (specify)  Specify: Notify provider for possible Code Sepsis   Document height and weight   Cardiac monitoring   Maintain IV access   Vital signs   Orthostatic vital signs on admission   Notify physician (specify)   Refer to Sidebar Report Refer to ICU, Med-Surg, Progressive, and Step-Down Mobility Protocol Sidebars   Daily weights   If patient diabetic or glucose greater than 140 notify physician for Sliding Scale Insulin Orders   Intake and output   Neuro checks   Apply Syncope Care Plan   Initiate Oral Care Protocol   Initiate Carrier Fluid Protocol   Mobility Protocol: No Restrictions RN to initiate protocols based on patient's level of care   NIH Stroke Scale   Full code   Consult to hospitalist   Consult to neurology   OT eval and treat   PT eval and treat    Pulse oximetry with vital signs   SLP eval and treat Reason for evaluation: .Swallowing evaluation (BSE, MBS and/or diet order as indicated)   I-Stat Lactic Acid, ED   EKG 12-Lead   ECHOCARDIOGRAM COMPLETE   EEG adult   Admit to Inpatient (patient's expected length of stay will be greater than 2 midnights or inpatient only procedure)   Aspiration precautions   Seizure precautions   Fall precautions   VAS US  CAROTID    Author: Mario LULLA Blanch, MD 12 pm- 8 pm. [facility]. 07/14/2023 2:39 PM Please note for any communication after hours contact [facility] Assigned provider on call on Amion.

## 2023-07-14 NOTE — Procedures (Signed)
 Patient Name: [name]  MRN: [id number]  Epilepsy Attending: Arlin MALVA Krebs  Referring Physician/Provider: Tobie Mario GAILS, MD  Date:  07/14/2023 Duration: 22.19 mins  Patient history: 68yo M with ams. EEG to evaluate for seizure  Level of alertness: Awake, drowsy  AEDs during EEG study: Ativan   Technical aspects: This EEG study was done with scalp electrodes positioned according to the 10-20 International system of electrode placement. Electrical activity was reviewed with band pass filter of 1-70Hz , sensitivity of 7 uV/mm, display speed of 60mm/sec with a 60Hz  notched filter applied as appropriate. EEG data were recorded continuously and digitally stored.  Video monitoring was available and reviewed as appropriate.  Description: The posterior dominant rhythm consists of 8 Hz activity of moderate voltage (25-35 uV) seen predominantly in posterior head regions, symmetric and reactive to eye opening and eye closing. Drowsiness was characterized by attenuation of the posterior background rhythm. EEG showed intermittent generalized and maximal right parieto-occipital 3 to 6 Hz theta-delta slowing. Sharp waves were noted in right parieto-occipital region. Hyperventilation and photic stimulation were not performed.     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 Additionally there is cortical dysfunction arising from right parieto-occipital likely secondary to underlying structural abnormality. Lastly there is mild diffuse encephalopathy. No seizures were seen throughout the recording.  [name] [name]

## 2023-07-14 NOTE — Progress Notes (Signed)
 New Admission Note:   Arrival Method: stretcher Mental Orientation: aa+ox4 Telemetry: A-Fib Assessment: Completed Skin: c/d/i IV: LR at 75 Pain: denies Tubes: n/a Safety Measures: Safety Fall Prevention Plan has been given, discussed and signed Admission: Completed [facility] Orientation: Patient has been orientated to the room, unit and staff.  Family: wife present  Orders have been reviewed and implemented. Will continue to monitor the patient. Call light has been placed within reach and bed alarm has been activated.   Doyal Sias, RN

## 2023-07-14 NOTE — ED Notes (Signed)
 Pt unable to provide urine at this time

## 2023-07-14 NOTE — ED Triage Notes (Signed)
 Patient BIB [facility] from home. Patient's wife called EMS this morning as patient woke up at 0545 and started screaming. Went to bed normally last night only complaining of chills, but normal otherwise. Patient now agitated, feels warm to the touch, not following commands. Patient tachycardic, BP 180/90 for EMS.

## 2023-07-14 NOTE — ED Notes (Signed)
 Pt transported to MRI

## 2023-07-14 NOTE — Consult Note (Signed)
 NEUROLOGY CONSULT NOTE   Date of service: July 14, 2023 Patient Name: [name] MRN:  [id number] DOB:  1955-12-04 Chief Complaint: Seizure activity Requesting Provider: Tobie Mario GAILS, MD  History of Present Illness  [name] is a 68 y.o. 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 Remembers going to bed, does not remember what he did yesterday but that is not unusual and he is able to give me a better history with some prompting. His wife states that they had a normal day yesterday and then they went to bed.  At about 4:00 in the morning he woke her up screaming.  He had both hands raised straight up in the air and they were both stiff and she was unable to wake them up.  She noted shaking in his right arm and he began gurgling.  She called EMS.  On arrival to the emergency room he remained altered with agitation.  He is now back to baseline and does not remember any of these events.  He states he feels like he just randomly woke up in the ER.  He denies recent head trauma, history of seizures, history of recent illness.  He did have an MRI brain with and without contrast in February to look for evidence of metastasis as he does have bilateral renal cell carcinoma.  He did have a complete and partial nephrectomy.    ROS  Comprehensive ROS performed and pertinent positives documented in HPI   Past History   Past Medical History:  Diagnosis Date   Bacteremia 110/2017   Cancer (HCC)    renal mass at preop   Chronic kidney disease    CVA (cerebral infarction)    hemmorhagic  CVA   Depression    Dyslipidemia    Endocarditis    MV Vegitation   GERD (gastroesophageal reflux disease)    History of kidney stones     Hypertension    Mitral regurgitation    MRSA infection    2011   Myocardial infarction [facility])    Stroke [facility])     Past Surgical History:  Procedure Laterality Date   CARDIAC CATHETERIZATION     CARDIOVERSION N/A 09/14/2020   Procedure: CARDIOVERSION;  Surgeon: Loni Soyla LABOR, MD;  Location: [facility] ENDOSCOPY;  Service: Cardiovascular;  Laterality: N/A;   MITRAL VALVE REPAIR     NO PAST SURGERIES     RIGHT/LEFT HEART CATH AND CORONARY ANGIOGRAPHY N/A 05/11/2020   Procedure: RIGHT/LEFT HEART CATH AND CORONARY ANGIOGRAPHY;  Surgeon: Wonda Sharper, MD;  Location: [facility] INVASIVE CV LAB;  Service: Cardiovascular;  Laterality: N/A;   ROBOT ASSISTED LAPAROSCOPIC NEPHRECTOMY Right 02/22/2016   Procedure: XI ROBOTIC ASSISTED LAPAROSCOPIC NEPHRECTOMY;  Surgeon: Ricardo Likens, MD;  Location: [facility] ORS;  Service: Urology;  Laterality: Right;   ROBOTIC ASSITED PARTIAL NEPHRECTOMY Left 06/07/2021   Procedure: XI ROBOTIC ASSITED PARTIAL NEPHRECTOMY;  Surgeon: Likens Ricardo, MD;  Location: [facility] ORS;  Service: Urology;  Laterality: Left;  3 HRS   TEE WITHOUT CARDIOVERSION N/A 01/28/2020   Procedure: TRANSESOPHAGEAL ECHOCARDIOGRAM (TEE);  Surgeon: Okey Vina GAILS, MD;  Location: [facility] ENDOSCOPY;  Service: Cardiovascular;  Laterality: N/A;   TONSILLECTOMY     TRANSURETHRAL RESECTION OF PROSTATE N/A 03/28/2022   Procedure: TRANSURETHRAL RESECTION OF THE  PROSTATE (TURP);  Surgeon: Alvaro Hummer, MD;  Location: [facility] ORS;  Service: Urology;  Laterality: N/A;    Family History: Family History  Problem Relation Age of Onset   Heart disease Mother        mom RF as a child (died of heart problem in  77 )    Social History  reports that he has never smoked. He has never used smokeless tobacco. He reports current alcohol  use of about 4.0 - 5.0 standard drinks of alcohol  per week. He reports current drug use. Drug: Marijuana.  No Known Allergies  Medications   Current Facility-Administered Medications:     acetaminophen  (TYLENOL ) tablet 650 mg, 650 mg, Oral, Q6H PRN **OR** acetaminophen  (TYLENOL ) suppository 650 mg, 650 mg, Rectal, Q6H PRN, Tobie Modest V, MD   finasteride  (PROSCAR ) tablet 5 mg, 5 mg, Oral, Daily, Tobie, Ekta V, MD   heparin  injection 5,000 Units, 5,000 Units, Subcutaneous, Q8H, Tobie, Modest GAILS, MD   lactated ringers  infusion, , Intravenous, Continuous, Tobie Modest GAILS, MD, Last Rate: 75 mL/hr at 07/14/23 1430, New Bag at 07/14/23 1430   oxybutynin  (DITROPAN ) tablet 5 mg, 5 mg, Oral, QHS, Patel, Ekta V, MD   pantoprazole  (PROTONIX ) EC tablet 40 mg, 40 mg, Oral, QHS, Patel, Ekta V, MD   sodium chloride  flush (NS) 0.9 % injection 3 mL, 3 mL, Intravenous, Q12H, Tobie Modest V, MD, 3 mL at 07/14/23 1430  Current Outpatient Medications:    amoxicillin  (AMOXIL ) 500 MG capsule, Take 4 capsules (2,000 mg total) by mouth as directed. 4 pills one hour before dental appointment, Disp: 4 capsule, Rfl: 2   buPROPion  (WELLBUTRIN  XL) 150 MG 24 hr tablet, Take 150 mg by mouth daily., Disp: , Rfl:    diphenhydramine -acetaminophen  (TYLENOL  PM) 25-500 MG TABS tablet, Take 2 tablets by mouth at bedtime., Disp: , Rfl:    finasteride  (PROSCAR ) 5 MG tablet, Take 5 mg by mouth daily., Disp: , Rfl:    ketoconazole (NIZORAL) 2 % shampoo, Apply 1 Application topically 2 (two) times a week., Disp: , Rfl:    losartan  (COZAAR ) 50 MG tablet, Take 1 tablet (50 mg total) by mouth daily., Disp: 90 tablet, Rfl: 3   OVER THE COUNTER MEDICATION, Take 1-2 capsules by mouth See admin instructions. CBD gummies, Take 2 gummies at bedtime, may take 1 gummy in the middle of the night as needed for sleep, Disp: , Rfl:    oxybutynin  (DITROPAN ) 5 MG tablet, Take 5 mg by mouth at bedtime., Disp: , Rfl:    pantoprazole  (PROTONIX ) 40 MG tablet, Take 40 mg by mouth at bedtime., Disp: , Rfl:    tadalafil (CIALIS) 20 MG tablet, Take 20 mg by mouth daily as needed for erectile dysfunction., Disp: , Rfl:    traMADol  (ULTRAM ) 50 MG tablet,  Take 50 mg by mouth every 6 (six) hours as needed. (Patient not taking: Reported on 07/14/2023), Disp: , Rfl:   Vitals   Vitals:   07/14/23 0815 07/14/23 0830 07/14/23 1200 07/14/23 1452  BP: (!) 152/108 (!) 174/98 (!) 151/103   Pulse: 91 90 82   Resp: 13 13 10    Temp:    98.5 F (36.9 C)  TempSrc:    Oral  SpO2: 92% 100% 99%   Weight:      Height:        Body mass index is 28.39 kg/m.   Physical Exam   Constitutional: Appears well-developed and well-nourished.  Psych: Affect appropriate to situation.  Eyes: No  scleral injection.  HENT: No OP obstruction.  Head: Normocephalic.  Cardiovascular: Normal rate and regular rhythm.  Respiratory: Effort normal, non-labored breathing.  GI: Soft.  No distension. There is no tenderness.  Skin: WDI.   Neurologic Examination   Neuro: Mental Status: Patient is awake, alert, oriented to person, place, month, year, and situation. Patient is able to give a clear and coherent history. No signs of aphasia or neglect Cranial Nerves: II: Visual Fields are full. Pupils are equal, round, and reactive to light.   III,IV, VI: EOMI without ptosis or diploplia.  V: Facial sensation is symmetric to temperature VII: Right facial asymmetry  VIII: Hearing is intact to voice X: Palate elevates symmetrically XI: Shoulder shrug is symmetric. XII: Tongue protrudes midline without atrophy or fasciculations.  Motor: Tone is normal. Bulk is normal. 5/5 strength was present in all four extremities.  Sensory: Sensation is symmetric to light touch and temperature in the arms and legs. No extinction to DSS present.  Cerebellar: HKS are intact bilaterally FNF with mild ataxia on the right with slow RAM.    Labs/Imaging/Neurodiagnostic studies   CBC:  Recent Labs  Lab 08/01/2023 0654  WBC 11.1*  NEUTROABS 8.7*  HGB 14.6  HCT 43.2  MCV 89.8  PLT 208   Basic Metabolic Panel:  Lab Results  Component Value Date   NA 139 Aug 01, 2023   K 3.7  08-01-23   CO2 18 (L) Aug 01, 2023   GLUCOSE 119 (H) 2023/08/01   BUN 27 (H) 2023/08/01   CREATININE 2.33 (H) 08-01-2023   CALCIUM  9.5 August 01, 2023   GFRNONAA 30 (L) 08/01/23   GFRAA 54 (L) 01/26/2020   Lipid Panel:  Lab Results  Component Value Date   LDLCALC 75 12/13/2017   HgbA1c:  Lab Results  Component Value Date   HGBA1C 5.8 (H) 03/07/2022   Urine Drug Screen: No results found for: LABOPIA, COCAINSCRNUR, LABBENZ, AMPHETMU, THCU, LABBARB  Alcohol  Level No results found for: [facility] INR  Lab Results  Component Value Date   INR 1.1 08-01-23   APTT  Lab Results  Component Value Date   APTT 25 10/29/2015   AED levels:  Lab Results  Component Value Date   PHENYTOIN  <2.5 (L) 03/01/2008    02/12/2023 MRI Brain w wo: 1. No acute or reversible finding. No evidence of metastatic disease. 2. Old infarctions affecting the left thalamus, left basal ganglia and right external capsule and radiating white matter tracts. Considerable chronic hemosiderin deposition in the region of the right external capsule infarction and mild hemosiderin deposition in the region of the left basal ganglia infarction. Few other scattered punctate foci of hemosiderin deposition. Wallerian degeneration noted within the radiating white matter tracts on the right extending down into the cerebral peduncle.  MRI Brain w wo: 1. No acute intracranial abnormality. 2. 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 3. Sequelae of prior hemorrhage in the right subinsular region and scattered foci of hemosiderin staining in cerebral hemispheres, suggestive of prior hemorrhage.   Neurodiagnostics rEEG:  This study showed evidence of epileptogenicity arising from right parieto-occipital. Additionally there is cortical dysfunction arising from right parieto-occipital likely secondary to underlying structural abnormality. Lastly  there is mild diffuse encephalopathy. No seizures were seen throughout the recording.   ASSESSMENT   [name] is a 68 y.o. 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 Given description consistent with clinical seizure localizing to the region of his prior stroke and epileptogenicity on EEG suspect etiology was post-ictal agitation after seizure.  RECOMMENDATIONS  - Depakote  load with 15mg /kg and then continue depakote  DR 500mg  BID - Ambulatory referral to [facility] for outpatient neurology f/u - Please emphasize to patient at hospital discharge the seizure precautions below including no driving for 6 mos after last seizure  Patient is back to baseline, OK to d/c tomorrow from neurology standpoint. We will not continue to actively follow but please re-engage if additional neurologic concerns arise. ______________________________________________________________________    Signed, Jorene Last, NP [facility]   Seizure precautions: Per [place]  DMV statutes, patients with seizures are not allowed to drive until they have been seizure-free for six months and cleared by a physician    Use caution when using heavy equipment or power tools. Avoid working on ladders or at heights. Take showers instead of baths. Ensure the water  temperature is not too high on the home water  heater. Do not go swimming alone. Do not lock yourself in a room alone (i.e. bathroom). When caring for infants or small children, sit down when holding, feeding, or changing them to minimize risk of injury to the child in the event you have a seizure. Maintain good sleep hygiene. Avoid alcohol .     After a  seizure, most patients experience confusion, fatigue, muscle pain and/or a headache. Thus, one should permit the individual to sleep. For the next few days, reassurance is essential. Being calm and helping reorient the person is also of importance.   Most seizures are painless and end spontaneously. Seizures are not harmful to others but can lead to complications such as stress on the lungs, brain and the heart. Individuals with prior lung problems may develop labored breathing and respiratory distress.    Attending Neurohospitalist Addendum Patient seen and examined with APP/Resident. Agree with the history and physical as documented above. Agree with the plan as documented, which I helped formulate. I have edited the note above to reflect my full findings and recommendations. I have independently reviewed the chart, obtained history, review of systems and examined the patient.I have personally reviewed pertinent head/neck/spine imaging (CT/MRI). Please feel free to call with any questions.  -- Elida Ross, MD [facility] [phone]  If 7pm- 7am, please page neurology on call as listed in [facility].

## 2023-07-14 NOTE — Plan of Care (Signed)
  Problem: Education: Goal: Knowledge of condition and prescribed therapy will improve Outcome: Progressing   Problem: Cardiac: Goal: Will achieve and/or maintain adequate cardiac output Outcome: Progressing   Problem: Physical Regulation: Goal: Complications related to the disease process, condition or treatment will be avoided or minimized Outcome: Progressing   

## 2023-07-15 ENCOUNTER — Inpatient Hospital Stay (HOSPITAL_BASED_OUTPATIENT_CLINIC_OR_DEPARTMENT_OTHER)

## 2023-07-15 DIAGNOSIS — N1832 Chronic kidney disease, stage 3b: Secondary | ICD-10-CM

## 2023-07-15 DIAGNOSIS — I10 Essential (primary) hypertension: Secondary | ICD-10-CM

## 2023-07-15 DIAGNOSIS — R569 Unspecified convulsions: Secondary | ICD-10-CM | POA: Diagnosis not present

## 2023-07-15 DIAGNOSIS — R4182 Altered mental status, unspecified: Secondary | ICD-10-CM

## 2023-07-15 DIAGNOSIS — I6930 Unspecified sequelae of cerebral infarction: Secondary | ICD-10-CM | POA: Diagnosis not present

## 2023-07-15 DIAGNOSIS — R410 Disorientation, unspecified: Secondary | ICD-10-CM | POA: Diagnosis not present

## 2023-07-15 LAB — ECHOCARDIOGRAM COMPLETE
AR max vel: 2.65 cm2
AV Area VTI: 2.86 cm2
AV Area mean vel: 2.66 cm2
AV Mean grad: 3 mmHg
AV Peak grad: 5.5 mmHg
Ao pk vel: 1.17 m/s
Area-P 1/2: 2.29 cm2
Height: 68 in
S' Lateral: 3.1 cm
Weight: 2980.62 [oz_av]

## 2023-07-15 LAB — HIV ANTIBODY (ROUTINE TESTING W REFLEX): HIV Screen 4th Generation wRfx: NONREACTIVE

## 2023-07-15 LAB — CK: Total CK: 643 U/L — ABNORMAL HIGH (ref 49–397)

## 2023-07-15 LAB — MAGNESIUM: Magnesium: 2 mg/dL (ref 1.7–2.4)

## 2023-07-15 LAB — T4, FREE: Free T4: 1.23 ng/dL — ABNORMAL HIGH (ref 0.61–1.12)

## 2023-07-15 MED ORDER — DIVALPROEX SODIUM 500 MG PO DR TAB: 500.0000 mg | DELAYED_RELEASE_TABLET | Freq: Two times a day (BID) | ORAL | 1 refills | Status: DC

## 2023-07-15 MED ORDER — ATORVASTATIN CALCIUM 40 MG PO TABS: 40.0000 mg | ORAL_TABLET | Freq: Every day | ORAL | 2 refills | Status: DC

## 2023-07-15 NOTE — Progress Notes (Signed)
 DISCHARGE NOTE HOME [name] to be discharged Home per MD order. Discussed prescriptions and follow up appointments with the patient. Prescriptions given to patient; medication list explained in detail. Patient verbalized understanding.  Skin clean, dry and intact without evidence of skin break down, no evidence of skin tears noted. IV catheter discontinued intact. Site without signs and symptoms of complications. Dressing and pressure applied. Pt denies pain at the site currently. No complaints noted.  Patient free of lines, drains, and wounds.   An After Visit Summary (AVS) was printed and given to the patient. Patient escorted via wheelchair, and discharged home via private auto.  Doyal Sias, RN

## 2023-07-15 NOTE — Plan of Care (Signed)
  Problem: Education: Goal: Knowledge of condition and prescribed therapy will improve Outcome: Progressing   Problem: Cardiac: Goal: Will achieve and/or maintain adequate cardiac output Outcome: Progressing   Problem: Physical Regulation: Goal: Complications related to the disease process, condition or treatment will be avoided or minimized Outcome: Progressing   

## 2023-07-15 NOTE — Care Management CC44 (Signed)
 Condition Code 44 Documentation Completed  Patient Details  Name: Langston Tuberville MRN: [id number] Date of Birth: 05/09/1955   Condition Code 44 given:  Yes Patient signature on Condition Code 44 notice:  Yes Documentation of 2 MD's agreement:  Yes Code 44 added to claim:  Yes    Tom-Johnson, Harvest Muskrat, RN 07/15/2023, 2:41 PM

## 2023-07-15 NOTE — Discharge Summary (Signed)
 Physician Discharge Summary   Patient: [name] MRN: [id number] DOB: 1955/07/03  Admit date:     07/14/2023  Discharge date: {dischdate:26783}  Discharge Physician: Concepcion Riser   PCP: [facility]   Recommendations at discharge:  {Tip this will not be part of the note when signed- Example include specific recommendations for outpatient follow-up, pending tests to follow-up on. (Optional):26781}  ***  Discharge Diagnoses: Principal Problem:   Seizure [facility]) Active Problems:   Essential hypertension   Delirium  Resolved Problems:   * No resolved hospital problems. [facility] Course: No notes on file  Assessment and Plan: No notes have been filed under this hospital service. Service: Hospitalist     {Tip this will not be part of the note when signed Body mass index is 28.33 kg/m. , ,  (Optional):26781}  {(NOTE) Pain control PDMP Statment (Optional):26782} Consultants: *** Procedures performed: ***  Disposition: {Plan; Disposition:26390} Diet recommendation:  Discharge Diet Orders (From admission, onward)     Start     Ordered   07/15/23 0000  Diet - low sodium heart healthy        07/15/23 1401           {Diet_Plan:26776} DISCHARGE MEDICATION: Allergies as of 07/15/2023   No Known Allergies      Medication List     STOP taking these medications    diphenhydramine -acetaminophen  25-500 MG Tabs tablet Commonly known as: TYLENOL  PM   traMADol  50 MG tablet Commonly known as: ULTRAM        TAKE these medications    amoxicillin  500 MG capsule Commonly known as: AMOXIL  Take 4 capsules (2,000 mg total) by mouth as directed. 4 pills one hour before dental appointment   atorvastatin  40 MG tablet Commonly known as: Lipitor Take 1 tablet (40 mg total) by mouth daily.   buPROPion  150 MG 24 hr tablet Commonly known as: WELLBUTRIN  XL Take 150 mg by mouth daily.   divalproex  500 MG DR tablet Commonly known as:  DEPAKOTE  Take 1 tablet (500 mg total) by mouth every 12 (twelve) hours.   finasteride  5 MG tablet Commonly known as: PROSCAR  Take 5 mg by mouth daily.   ketoconazole 2 % shampoo Commonly known as: NIZORAL Apply 1 Application topically 2 (two) times a week.   losartan  50 MG tablet Commonly known as: COZAAR  Take 1 tablet (50 mg total) by mouth daily.   OVER THE COUNTER MEDICATION Take 1-2 capsules by mouth See admin instructions. CBD gummies, Take 2 gummies at bedtime, may take 1 gummy in the middle of the night as needed for sleep   oxybutynin  5 MG tablet Commonly known as: DITROPAN  Take 5 mg by mouth at bedtime.   pantoprazole  40 MG tablet Commonly known as: PROTONIX  Take 40 mg by mouth at bedtime.   tadalafil 20 MG tablet Commonly known as: CIALIS Take 20 mg by mouth daily as needed for erectile dysfunction.        Follow-up Information     [name] Follow up in 1 week(s).   Contact information: [address], [address] [place] [address] [phone]         [facility]. Schedule an appointment as soon as possible for a visit in 3 week(s).   Specialty: Neurology Contact information: [address] [place]  [address] [phone]               Discharge Exam: [name]  07/14/23 0700 07/14/23 1746  Weight: 84.7 kg 84.5 kg   ***  Condition at discharge: {DC Condition:26389}  The results of significant diagnostics from this hospitalization (including imaging, microbiology, ancillary and laboratory) are listed below for reference.   Imaging Studies: EEG adult Result Date: 07/14/2023 Shelton Arlin KIDD, MD     07/14/2023  5:11 PM Patient Name: [name] MRN: [id number] Epilepsy Attending: Arlin KIDD Shelton Referring Physician/Provider: Tobie Mario GAILS, MD Date:  07/14/2023 Duration: 22.19 mins Patient history: 68yo M with ams. EEG to evaluate for seizure  Level of alertness: Awake, drowsy AEDs during EEG study: Ativan  Technical aspects: This EEG study was done with scalp electrodes positioned according to the 10-20 International system of electrode placement. Electrical activity was reviewed with band pass filter of 1-70Hz , sensitivity of 7 uV/mm, display speed of 66mm/sec with a 60Hz  notched filter applied as appropriate. EEG data were recorded continuously and digitally stored.  Video monitoring was available and reviewed as appropriate. Description: The posterior dominant rhythm consists of 8 Hz activity of moderate voltage (25-35 uV) seen predominantly in posterior head regions, symmetric and reactive to eye opening and eye closing. Drowsiness was characterized by attenuation of the posterior background rhythm. EEG showed intermittent generalized and maximal right parieto-occipital 3 to 6 Hz theta-delta slowing. Sharp waves were noted in right parieto-occipital region. Hyperventilation and photic stimulation were not performed.   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 Additionally there is cortical dysfunction arising from right parieto-occipital likely secondary to underlying structural abnormality. Lastly there is mild diffuse encephalopathy. No seizures were seen throughout the recording. Arlin KIDD Shelton   MR BRAIN WO CONTRAST Result Date: 07/14/2023 EXAM: MRI BRAIN WITHOUT CONTRAST 07/14/2023 03:27:45 PM TECHNIQUE: Multiplanar multisequence MRI of the head/brain was performed without the administration of intravenous contrast. COMPARISON: MRI brain 02/12/2023, head CT 07/14/2023. CLINICAL HISTORY: Mental status change, unknown cause. Pt had a difficult time holding still. Best images possible. 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 Old  perforator infarct in the medial aspect of the left thalamus. Sequelae of prior hemorrhage in the right subinsular region. Scattered foci of hemosiderin staining in cerebral hemispheres, suggestive of prior hemorrhage. No acute infarct. No acute intracranial hemorrhage. No mass. No midline shift. No hydrocephalus. The sella is unremarkable. Normal flow voids. ORBITS: No acute abnormality. SINUSES AND MASTOIDS: No acute abnormality. BONES AND SOFT TISSUES: Normal marrow signal. No acute soft tissue abnormality. IMPRESSION: 1. No acute intracranial abnormality. 2. 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 3. Sequelae of prior hemorrhage in the right subinsular region and scattered foci of hemosiderin staining in cerebral hemispheres, suggestive of prior hemorrhage. Electronically signed by: Ryan Chess MD 07/14/2023 03:32 PM EDT RP Workstation: [id number]   CT Head Wo Contrast Result Date: 07/14/2023 EXAM: CT HEAD WITHOUT 07/14/2023 12:34:00 PM TECHNIQUE: CT of the head was performed without the administration of intravenous contrast. Automated exposure control, iterative reconstruction, and/or weight based adjustment of the mA/kV was utilized to reduce the radiation dose to as low as reasonably achievable. COMPARISON: MRI head without and with contrast 02/12/2023. CT head without contrast 06/16/2017. CLINICAL HISTORY: Altered mental status, nontraumatic (Ped 0-17y). FINDINGS: BRAIN AND VENTRICLES: No acute intracranial hemorrhage. No mass effect or midline shift. No extra-axial fluid collection. Gray-white differentiation is maintained. A  remote lacunar infarct is again noted in the left thalamus. A remote hemorrhagic infarct is present in the right external capsule. Mild ex vacuo dilation of the right lateral ventricle is noted. ORBITS: No acute abnormality. SINUSES AND MASTOIDS: No acute abnormality. SOFT TISSUES AND SKULL: No  acute skull fracture. No acute soft tissue abnormality. IMPRESSION: 1. No acute intracranial abnormality. 2. Remote lacunar infarcts in the left thalamus and remote hemorrhagic infarct in the right external capsule. 3. Mild ex vacuo dilation of the right lateral ventricle. Electronically signed by: Lonni Necessary MD 07/14/2023 12:52 PM EDT RP Workstation: [id number]   DG Chest Portable 1 View Result Date: 07/14/2023 CLINICAL DATA:  Altered mental status.  History of cancer. EXAM: PORTABLE CHEST 1 VIEW COMPARISON:  PA and lateral chest 01/15/2023 FINDINGS: There is mild cardiomegaly, with again noted metallic MVR. The mediastinum is normally outlined. No vascular congestion is seen. The lungs are clear. Stable mild dextroscoliosis and degenerative change thoracic spine. IMPRESSION: No evidence of acute chest disease. Mild cardiomegaly. Electronically Signed   By: Francis Quam M.D.   On: 07/14/2023 07:54    Microbiology: Results for orders placed or performed during the hospital encounter of 07/14/23  Culture, blood (Routine x 2)     Status: None (Preliminary result)   Collection Time: 07/14/23  6:54 AM   Specimen: BLOOD  Result Value Ref Range Status   Specimen Description BLOOD LEFT ANTECUBITAL  Final   Special Requests   Final    BOTTLES DRAWN AEROBIC AND ANAEROBIC Blood Culture adequate volume   Culture   Final    NO GROWTH < 24 HOURS Performed at [facility], [address], [place], [place] [address]    Report Status PENDING  Incomplete  Culture, blood (Routine x 2)     Status: None (Preliminary result)   Collection Time: 07/14/23  6:59 AM   Specimen: BLOOD  Result Value Ref Range Status   Specimen Description BLOOD RIGHT ANTECUBITAL  Final   Special Requests   Final    BOTTLES DRAWN AEROBIC AND ANAEROBIC Blood Culture results may not be optimal due to an inadequate volume of blood received in culture bottles   Culture   Final    NO GROWTH < 24 HOURS Performed at [facility], [address], [place], [place] [address]    Report Status PENDING  Incomplete  Resp panel by RT-PCR (RSV, Flu A&B, Covid) Anterior Nasal Swab     Status: None   Collection Time: 07/14/23 11:56 AM   Specimen: Anterior Nasal Swab  Result Value Ref Range Status   SARS Coronavirus 2 by RT PCR NEGATIVE NEGATIVE Final   Influenza A by PCR NEGATIVE NEGATIVE Final   Influenza B by PCR NEGATIVE NEGATIVE Final    Comment: (NOTE) The Xpert Xpress SARS-CoV-2/FLU/RSV plus assay is intended as an aid in the diagnosis of influenza from Nasopharyngeal swab specimens and should not be used as a sole basis for treatment. Nasal washings and aspirates are unacceptable for Xpert Xpress SARS-CoV-2/FLU/RSV testing.  Fact Sheet for Patients: BloggerCourse.com  Fact Sheet for Healthcare Providers: SeriousBroker.it  This test is not yet approved or cleared by the [place]  FDA and has been authorized for detection and/or diagnosis of SARS-CoV-2 by FDA under an Emergency Use Authorization (EUA). This EUA will remain in effect (meaning this test can be used) for the duration of the COVID-19 declaration under Section 564(b)(1) of the Act, 21 U.S.C. section 360bbb-3(b)(1), unless the authorization  is terminated or revoked.     Resp Syncytial Virus by PCR NEGATIVE NEGATIVE Final    Comment: (NOTE) Fact Sheet for Patients: BloggerCourse.com  Fact Sheet for Healthcare Providers: SeriousBroker.it  This test is not yet approved or cleared by the [place]  FDA and has been authorized for detection and/or diagnosis of SARS-CoV-2 by FDA under an Emergency Use Authorization (EUA). This EUA will remain in effect (meaning this test can be used) for the duration of the COVID-19 declaration under Section 564(b)(1) of the Act, 21 U.S.C. section 360bbb-3(b)(1), unless the authorization is  terminated or revoked.  Performed at [facility], [address] [place], [place] [address]     Labs: CBC: Recent Labs  Lab 07/14/23 0654  WBC 11.1*  NEUTROABS 8.7*  HGB 14.6  HCT 43.2  MCV 89.8  PLT 208   Basic Metabolic Panel: Recent Labs  Lab 07/14/23 0654 07/14/23 2321  NA 139  --   K 3.7  --   CL 106  --   CO2 18*  --   GLUCOSE 119*  --   BUN 27*  --   CREATININE 2.33*  --   CALCIUM  9.5  --   MG  --  2.0   Liver Function Tests: Recent Labs  Lab 07/14/23 0654  AST 25  ALT 14  ALKPHOS 69  BILITOT 0.9  PROT 7.6  ALBUMIN 4.4   CBG: Recent Labs  Lab 07/14/23 2149  GLUCAP 109*    Discharge time spent: {LESS THAN/GREATER THAN:26388} 30 minutes.  Signed: Concepcion Riser, MD [facility] 07/15/2023

## 2023-07-15 NOTE — Progress Notes (Signed)
*  PRELIMINARY RESULTS* Echocardiogram 2D Echocardiogram has been performed.  Benard FORBES Stallion 07/15/2023, 10:35 AM

## 2023-07-15 NOTE — Evaluation (Signed)
 Occupational Therapy Evaluation Patient Details Name: [name] MRN: [id number] DOB: 12/31/1955 Today's Date: 07/15/2023   History of Present Illness   Pt is a 68 yo male presenting on 7/13 with delirium. Per MD note, pt's wife states that he woke up with both his UE straight up and was altered. EEG with epileptogenicity and cortical dysfunction arising from R parieto-occipital, mild diffuse encephalopathy. 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 Admitted for above and presents near baseline at modified independence for ADLs and functional mobility. Pt oriented and following multiple step commands, dual tasking without difficulty; scored 2/28 (normal) on short blessed test.  Pt with some decreased FMC of L hand but appears functional.  He was educated to have supervision for med mgmt and not driving until cleared by MD.  No further OT needs identified at this time and OT will sign off.      If plan is discharge home, recommend the following:   Assistance with cooking/housework;Direct supervision/assist for medications management;Direct supervision/assist for financial management;Assist for transportation     Functional Status Assessment   Patient has had a recent decline in their functional status and demonstrates the ability to make significant improvements in function in a reasonable and predictable amount of time.     Equipment Recommendations   None recommended by OT     Recommendations for Other Services         Precautions/Restrictions   Precautions Precautions: Fall Recall of Precautions/Restrictions: Intact Restrictions Weight Bearing Restrictions Per Provider Order: No     Mobility Bed Mobility               General bed mobility comments: OOB in recliner upon entry     Transfers Overall transfer level: Modified independent                 General transfer comment: no AD, no assist required      Balance Overall balance assessment: No apparent balance deficits (not formally assessed)                                         ADL either performed or assessed with clinical judgement   ADL Overall ADL's : Modified independent                                     Functional mobility during ADLs: Modified independent General ADL Comments: pt managing ADls and mobility in room without assist     Vision Baseline Vision/History: 1 Wears glasses Patient Visual Report: No change from baseline Vision Assessment?: Wears glasses for reading Additional Comments: appears [facility]     Perception         Praxis         Pertinent Vitals/Pain Pain Assessment Pain Assessment: No/denies pain     Extremity/Trunk Assessment Upper Extremity Assessment Upper Extremity Assessment: Right hand dominant;Overall WFL for tasks assessed (some decreased FMC on L side, but functional)   Lower Extremity Assessment Lower Extremity Assessment: Defer to PT evaluation   Cervical / Trunk Assessment Cervical / Trunk Assessment: Normal   Communication Communication Communication: Impaired Factors Affecting Communication: Hearing impaired   Cognition Arousal: Alert  Behavior During Therapy: WFL for tasks assessed/performed Cognition: No family/caregiver present to determine baseline             OT - Cognition Comments: appears WFL, pt oriented and following multiple step commands.  He reports not recalling incident but able to recall events from being told.Short blessed test scored 2/28 (normal)                 Following commands: Intact       Cueing  General Comments   Cueing Techniques: Verbal cues  discussed safety with supervision for med mgmt, and not driving until cleared by MD.  Pt agreeable.   Exercises      Shoulder Instructions      Home Living Family/patient expects to be discharged to:: Private residence Living Arrangements: Spouse/significant other Available Help at Discharge: Family;Available 24 hours/day Type of Home: House Home Access: Stairs to enter [employer] of Steps: 3 Entrance Stairs-Rails: Right Home Layout: Two level;Full bath on main level;Able to live on main level with bedroom/bathroom (bounus room upstairs q) Alternate Level Stairs-Number of Steps: flight   Bathroom Shower/Tub: Producer, television/film/video: Standard     Home Equipment: None          Prior Functioning/Environment Prior Level of Function : Independent/Modified Independent;Driving             Mobility Comments: independent ADLs Comments: independent ADLs, light IADLs    OT Problem List: Decreased coordination   OT Treatment/Interventions:        OT Goals(Current goals can be found in the care plan section)   Acute Rehab OT Goals Patient Stated Goal: home OT Goal Formulation: With patient   OT Frequency:       Co-evaluation              AM-PAC OT 6 Clicks Daily Activity     Outcome Measure Help from another person eating meals?: None Help from another person taking care of personal grooming?: None Help from another person toileting, which includes using toliet, bedpan, or urinal?: None Help from another person bathing (including washing, rinsing, drying)?: None Help from another person to put on and taking off regular upper body clothing?: None Help from another person to put on and taking off regular lower body clothing?: None 6 Click Score: 24   End of Session Nurse Communication: Mobility status  Activity Tolerance: Patient tolerated treatment well Patient left: in chair;with call bell/phone within reach  OT Visit Diagnosis: Unsteadiness on feet (R26.81)                Time: 9073-9060 OT Time Calculation (min): 13 min Charges:  OT  General Charges $OT Visit: 1 Visit OT Evaluation $OT Eval Low Complexity: 1 Low  Etta NOVAK, OT Acute Rehabilitation Services Office [phone] Secure Chat Preferred    Etta GORMAN Hope 07/15/2023, 9:49 AM

## 2023-07-15 NOTE — Progress Notes (Signed)
 VASCULAR LAB    Carotid duplex has been performed.  See CV proc for preliminary results.   Rontavious Albright, RVT 07/15/2023, 12:13 PM

## 2023-07-15 NOTE — TOC Transition Note (Signed)
 Transition of Care [facility]) - Discharge Note   Patient Details  Name: [name] MRN: [id number] Date of Birth: 02-10-1955  [facility] CM/SW Contact:  Tom-Johnson, Harvest Muskrat, RN Phone Number: 07/15/2023, 2:43 PM   Clinical Narrative:     Patient is scheduled for discharge today.  Readmission Risk Assessment done. Outpatient referral, hospital f/u and discharge instructions on AVS. No PT/OT f/u, no TOC needs or recommendations noted. Wife, [name] to transport at discharge.  No further TOC needs noted.       Final next level of care: Home/Self Care Barriers to Discharge: Barriers Resolved   Patient Goals and CMS Choice Patient states their goals for this hospitalization and ongoing recovery are:: To return home CMS Medicare.gov Compare Post Acute Care list provided to:: Patient Choice offered to / list presented to : NA      Discharge Placement                Patient to be transferred to facility by: Wife Name of family member notified: [name]    Discharge Plan and Services Additional resources added to the After Visit Summary for                  DME Arranged: N/A DME Agency: NA       HH Arranged: NA HH Agency: NA        Social Drivers of Health (SDOH) Interventions SDOH Screenings   Food Insecurity: No Food Insecurity (07/14/2023)  Housing: Low Risk  (07/14/2023)  Transportation Needs: No Transportation Needs (07/14/2023)  Utilities: Not At Risk (07/14/2023)  Depression (PHQ2-9): Low Risk  (02/20/2021)  Social Connections: Unknown (07/14/2023)  Tobacco Use: Low Risk  (07/14/2023)     Readmission Risk Interventions    07/15/2023    2:42 PM  Readmission Risk Prevention Plan  Post Dischage Appt Complete  Medication Screening Complete  Transportation Screening Complete

## 2023-07-15 NOTE — Care Management Obs Status (Signed)
 MEDICARE OBSERVATION STATUS NOTIFICATION   Patient Details  Name: Frank Novelo MRN: [id number] Date of Birth: 10/21/1955   Medicare Observation Status Notification Given:  Yes    Tom-Johnson, Harvest Muskrat, RN 07/15/2023, 2:41 PM

## 2023-07-15 NOTE — Evaluation (Signed)
 Physical Therapy Evaluation and Discharge Patient Details Name: [name] MRN: [id number] DOB: 02/24/1955 Today's Date: 07/15/2023  History of Present Illness  Pt is a 68 yo male presenting on 7/13 with delirium. Per MD note, pt's wife states that he woke up with both his UE straight up and was altered. EEG with epileptogenicity and cortical dysfunction arising from R parieto-occipital, mild diffuse encephalopathy. PMH includes asthma, HTN, bacterial endocarditis 2010, MI, CVA, mitral valve repair August 2022, A-fib on Eliquis , bilateral renal cell cancer s/p nephrectomy  Clinical Impression   Patient evaluated by Physical Therapy with no further acute PT needs identified. All education has been completed and the patient has no further questions. Able to walk the hallways without difficulty and manage a flight of stairs safely; See below for any follow-up Physical Therapy or equipment needs. PT is signing off. Thank you for this referral.         If plan is discharge home, recommend the following: Assist for transportation   Can travel by private vehicle        Equipment Recommendations None recommended by PT  Recommendations for Other Services       Functional Status Assessment Patient has not had a recent decline in their functional status     Precautions / Restrictions Precautions Precautions: Fall Recall of Precautions/Restrictions: Intact Restrictions Weight Bearing Restrictions Per Provider Order: No      Mobility  Bed Mobility Overal bed mobility: Independent                  Transfers Overall transfer level: Modified independent                 General transfer comment: no AD, no assist required    Ambulation/Gait Ambulation/Gait assistance: Independent Gait Distance (Feet): 200 Feet Assistive device: None Gait Pattern/deviations: Step-through pattern          Stairs Stairs: Yes Stairs assistance: Modified independent  (Device/Increase time) Stair Management: One rail Right, Forwards, Alternating pattern Number of Stairs: 12 General stair comments: No difficulty  Wheelchair Mobility     Tilt Bed    Modified Rankin (Stroke Patients Only)       Balance Overall balance assessment: No apparent balance deficits (not formally assessed)                                           Pertinent Vitals/Pain Pain Assessment Pain Assessment: No/denies pain    Home Living Family/patient expects to be discharged to:: Private residence Living Arrangements: Spouse/significant other Available Help at Discharge: Family;Available 24 hours/day Type of Home: House Home Access: Stairs to enter Entrance Stairs-Rails: Right Entrance Stairs-Number of Steps: 3 Alternate Level Stairs-Number of Steps: flight Home Layout: Two level;Full bath on main level;Able to live on main level with bedroom/bathroom (bounus room upstairs q) Home Equipment: None      Prior Function Prior Level of Function : Independent/Modified Independent;Driving             Mobility Comments: independent ADLs Comments: independent ADLs, light IADLs     Extremity/Trunk Assessment   Upper Extremity Assessment Upper Extremity Assessment: Defer to OT evaluation    Lower Extremity Assessment Lower Extremity Assessment: Overall WFL for tasks assessed    Cervical / Trunk Assessment Cervical / Trunk Assessment: Normal  Communication   Communication Communication: Impaired Factors Affecting Communication: Hearing impaired    Cognition  Arousal: Alert Behavior During Therapy: WFL for tasks assessed/performed                             Following commands: Intact       Cueing Cueing Techniques: Verbal cues     General Comments      Exercises     Assessment/Plan    PT Assessment Patient does not need any further PT services  PT Problem List         PT Treatment Interventions      PT  Goals (Current goals can be found in the Care Plan section)  Acute Rehab PT Goals Patient Stated Goal: Wants to get his lunch ordered PT Goal Formulation: All assessment and education complete, DC therapy    Frequency       Co-evaluation               AM-PAC PT 6 Clicks Mobility  Outcome Measure Help needed turning from your back to your side while in a flat bed without using bedrails?: None Help needed moving from lying on your back to sitting on the side of a flat bed without using bedrails?: None Help needed moving to and from a bed to a chair (including a wheelchair)?: None Help needed standing up from a chair using your arms (e.g., wheelchair or bedside chair)?: None Help needed to walk in hospital room?: None Help needed climbing 3-5 steps with a railing? : None 6 Click Score: 24    End of Session   Activity Tolerance: Patient tolerated treatment well Patient left: in bed;with call bell/phone within reach;Other (comment) (ordering lunch) Nurse Communication: Mobility status PT Visit Diagnosis: Other symptoms and signs involving the nervous system (R29.898)    Time: 8753-8698 PT Time Calculation (min) (ACUTE ONLY): 15 min   Charges:   PT Evaluation $PT Eval Low Complexity: 1 Low   PT General Charges $$ ACUTE PT VISIT: 1 Visit         Silvano Currier, PT  Acute Rehabilitation Services Office [phone] Secure Chat welcomed   Silvano VEAR Currier 07/15/2023, 1:21 PM

## 2023-07-16 LAB — PROLACTIN: Prolactin: 27.6 ng/mL — ABNORMAL HIGH (ref 3.6–25.2)

## 2023-07-17 ENCOUNTER — Telehealth: Payer: Self-pay | Admitting: Internal Medicine

## 2023-07-17 ENCOUNTER — Telehealth: Payer: Self-pay | Admitting: Pharmacist

## 2023-07-17 NOTE — Telephone Encounter (Signed)
 Received request from Dr. Okey for lipid management. Call pt to schedule appointment. Spoke to wife as per DPR Will be seeing him on July 31 at 3:30

## 2023-07-17 NOTE — Telephone Encounter (Signed)
 Spoke to pt.   I saw him in ER on Sunday by coincidenc   HE wa admitted after seizure   Reviewed labs    LDL is severely elevated (over 200    He has not tolerated lipitor or statins in past     He would like to try  Leqvio for this    Avoid statin side effect   Also ease of use   Will send [facility] to look at approval

## 2023-07-18 NOTE — Telephone Encounter (Signed)
 Apt scheduled on July 31 at 3:30 with lipid clinic

## 2023-07-19 LAB — CULTURE, BLOOD (ROUTINE X 2)
Culture: NO GROWTH
Culture: NO GROWTH
Special Requests: ADEQUATE

## 2023-07-23 DIAGNOSIS — Z905 Acquired absence of kidney: Secondary | ICD-10-CM | POA: Diagnosis not present

## 2023-07-23 DIAGNOSIS — R3915 Urgency of urination: Secondary | ICD-10-CM | POA: Diagnosis not present

## 2023-07-23 DIAGNOSIS — C641 Malignant neoplasm of right kidney, except renal pelvis: Secondary | ICD-10-CM | POA: Diagnosis not present

## 2023-07-23 DIAGNOSIS — C642 Malignant neoplasm of left kidney, except renal pelvis: Secondary | ICD-10-CM | POA: Diagnosis not present

## 2023-07-25 ENCOUNTER — Encounter: Payer: Self-pay | Admitting: Internal Medicine

## 2023-07-29 DIAGNOSIS — N529 Male erectile dysfunction, unspecified: Secondary | ICD-10-CM | POA: Diagnosis not present

## 2023-07-29 DIAGNOSIS — G40909 Epilepsy, unspecified, not intractable, without status epilepticus: Secondary | ICD-10-CM | POA: Diagnosis not present

## 2023-07-29 DIAGNOSIS — I69319 Unspecified symptoms and signs involving cognitive functions following cerebral infarction: Secondary | ICD-10-CM | POA: Diagnosis not present

## 2023-07-29 DIAGNOSIS — I10 Essential (primary) hypertension: Secondary | ICD-10-CM | POA: Diagnosis not present

## 2023-07-29 DIAGNOSIS — N1832 Chronic kidney disease, stage 3b: Secondary | ICD-10-CM | POA: Diagnosis not present

## 2023-07-29 DIAGNOSIS — G479 Sleep disorder, unspecified: Secondary | ICD-10-CM | POA: Diagnosis not present

## 2023-07-29 DIAGNOSIS — F4320 Adjustment disorder, unspecified: Secondary | ICD-10-CM | POA: Diagnosis not present

## 2023-07-29 DIAGNOSIS — G4700 Insomnia, unspecified: Secondary | ICD-10-CM | POA: Diagnosis not present

## 2023-07-29 DIAGNOSIS — Z905 Acquired absence of kidney: Secondary | ICD-10-CM | POA: Diagnosis not present

## 2023-07-29 DIAGNOSIS — E7800 Pure hypercholesterolemia, unspecified: Secondary | ICD-10-CM | POA: Diagnosis not present

## 2023-07-29 NOTE — Telephone Encounter (Signed)
 Agree   Discontinue statin

## 2023-08-01 ENCOUNTER — Telehealth: Payer: Self-pay | Admitting: Pharmacy Technician

## 2023-08-01 ENCOUNTER — Other Ambulatory Visit (HOSPITAL_COMMUNITY): Payer: Self-pay

## 2023-08-01 ENCOUNTER — Telehealth: Payer: Self-pay | Admitting: Pharmacist

## 2023-08-01 ENCOUNTER — Encounter: Payer: Self-pay | Admitting: Pharmacist

## 2023-08-01 ENCOUNTER — Ambulatory Visit: Attending: Cardiology | Admitting: Pharmacist

## 2023-08-01 DIAGNOSIS — E785 Hyperlipidemia, unspecified: Secondary | ICD-10-CM | POA: Diagnosis not present

## 2023-08-01 MED ORDER — EZETIMIBE 10 MG PO TABS
10.0000 mg | ORAL_TABLET | Freq: Every day | ORAL | 3 refills | Status: AC
  Filled 2023-08-01: qty 90, 90d supply, fill #0

## 2023-08-01 MED ORDER — REPATHA SURECLICK 140 MG/ML ~~LOC~~ SOAJ
140.0000 mg | SUBCUTANEOUS | 3 refills | Status: AC
  Filled 2023-08-01: qty 6, 84d supply, fill #0
  Filled 2023-08-01: qty 2, 28d supply, fill #0
  Filled 2023-08-15 – 2023-08-22 (×2): qty 2, 28d supply, fill #1

## 2023-08-01 NOTE — Patient Instructions (Addendum)
 Your Results:             Your most recent labs Goal  Total Cholesterol 299 < 200  Triglycerides 132 < 150  HDL (happy/good cholesterol) 52 > 40  LDL (lousy/bad cholesterol 221 < 70   Medication changes:  Start taking Zetia  10 mg by mouth daily and Repatha  140 mg under the skin every 14 days      Repatha  is a cholesterol medication that improved your body's ability to get rid of bad cholesterol known as LDL. It can lower your LDL up to 60%! It is an injection that is given under the skin every 2 weeks. The medication often requires a prior authorization from your insurance company. The most common side effects of Repatha  include runny nose, symptoms of the common cold, rarely flu or flu-like symptoms, back/muscle pain in about 3-4% of the patients, and redness, pain, or bruising at the injection site.   Lab orders: We want to repeat labs after 2-3 months.  We will send you a lab order to remind you once we get closer to that time.

## 2023-08-01 NOTE — Assessment & Plan Note (Signed)
 Assessment:  LDL goal: < 70 mg/dl last LDLc 778 mg/dl (92/86/7974) Currently not on any therapy  Intolerance to statins - rosuvastatin  and atorvastatin   Discussed next potential options (Zetia  PCSK-9 inhibitors, bempedoic acid and inclisiran); cost, dosing efficacy, side effects  Dicussed importance healthy diet and regular exercise in detail   Plan: Start taking Zetia  10 mg by mouth daily and Repatha  140 mg under the skin every 14 days F/u lab due in 3 months

## 2023-08-01 NOTE — Telephone Encounter (Signed)
 Pharmacy Patient Advocate Encounter  Received notification from [employer] that Prior Authorization for repatha  has been APPROVED from 01/02/23 to 01/01/24. Per test claim cost is 144.24 for 28 days   PA #/Case ID/Reference #: [id number]

## 2023-08-01 NOTE — Progress Notes (Signed)
 Patient ID: [name]                 DOB: May 26, 1955                    MRN: [id number]      HPI: [name] is a 68 y.o. male patient referred to lipid clinic by Dr.Ross. PMH is significant for PAF, hypertension, mitral valve insufficiency, hx of endocarditis, DVT, hx of CVA, EtOH abuse, familial hypercholesterolemia    At recent hopsiptalization patient was complining about myalgia from crestor  20 mg so it was changed to atorvastatin  bu pt is not able to tolerate it either due to lower legs muscle cramps. Pt referred to lipid clinic for injectable treatments   Reviewed options for lowering LDL cholesterol, including ezetimibe , PCSK-9 inhibitors, bempedoic acid and inclisiran.  Discussed mechanisms of action, dosing, side effects and potential decreases in LDL cholesterol.  Also reviewed cost information and potential options for patient assistance.  Current Medications: none  Intolerances: atorvastatin  40 mg - severe leg cramps rosuvastatin  20 mg and 10 mg  Risk Factors: familial hypercholesterolemia , hypertension, hx of CVA  LDL goal: <70 mg/dl TG <849 mg /dl  Last lab 92/86/7974 TC 299, LDL 221, TG 132 HDL 52  Diet: eats out 1 meal per day Diet coke,sweet ice half and half, water  - need to improve on water  intake    Exercise: 1 mile per day, gym 3 times per day - resistance bands   Family History:   Relation Problem Comments  Mother (Deceased) Heart disease mom RF as a child (died of heart problem in  83 )    Father (Deceased)   Maternal Grandmother (Deceased)   Maternal Grandfather (Deceased)   Paternal Grandmother (Deceased)   Paternal Grandfather (Deceased)     Social History:  Alcohol : 1 glass per day  Smoking: none    Labs:  Lipid Panel     Component Value Date/Time   CHOL 299 (H) 07/14/2023 1429   CHOL 151 12/13/2017 1147   TRIG 132 07/14/2023 1429   HDL 52 07/14/2023 1429   HDL 53 12/13/2017 1147   CHOLHDL 5.8 07/14/2023 1429   VLDL 26 07/14/2023 1429    LDLCALC 221 (H) 07/14/2023 1429   LDLCALC 75 12/13/2017 1147   LDLDIRECT 164.1 01/19/2010 0904   LABVLDL 23 12/13/2017 1147    Past Medical History:  Diagnosis Date   Bacteremia 110/2017   Cancer (HCC)    renal mass at preop   Chronic kidney disease    CVA (cerebral infarction)    hemmorhagic  CVA   Depression    Dyslipidemia    Endocarditis    [facility] [place]   GERD (gastroesophageal reflux disease)    History of kidney stones    Hypertension    Mitral regurgitation    MRSA infection    2011   Myocardial infarction [facility])    Stroke [facility])     Current Outpatient Medications on File Prior to Visit  Medication Sig Dispense Refill   amoxicillin  (AMOXIL ) 500 MG capsule Take 4 capsules (2,000 mg total) by mouth as directed. 4 pills one hour before dental appointment 4 capsule 2   atorvastatin  (LIPITOR) 40 MG tablet Take 1 tablet (40 mg total) by mouth daily. 30 tablet 2   buPROPion  (WELLBUTRIN  XL) 150 MG 24 hr tablet Take 150 mg by mouth daily.     divalproex  (DEPAKOTE ) 500 MG DR tablet Take 1 tablet (500 mg total) by  mouth every 12 (twelve) hours. 180 tablet 1   finasteride  (PROSCAR ) 5 MG tablet Take 5 mg by mouth daily.     ketoconazole (NIZORAL) 2 % shampoo Apply 1 Application topically 2 (two) times a week.     losartan  (COZAAR ) 50 MG tablet Take 1 tablet (50 mg total) by mouth daily. 90 tablet 3   OVER THE COUNTER MEDICATION Take 1-2 capsules by mouth See admin instructions. CBD gummies, Take 2 gummies at bedtime, may take 1 gummy in the middle of the night as needed for sleep     oxybutynin  (DITROPAN ) 5 MG tablet Take 5 mg by mouth at bedtime.     pantoprazole  (PROTONIX ) 40 MG tablet Take 40 mg by mouth at bedtime.     tadalafil (CIALIS) 20 MG tablet Take 20 mg by mouth daily as needed for erectile dysfunction.     No current facility-administered medications on file prior to visit.    No Known Allergies  Assessment/Plan:  1. Hyperlipidemia -  Problem  Dyslipidemia    Dyslipidemia Assessment:  LDL goal: < 70 mg/dl last LDLc 778 mg/dl (92/86/7974) Currently not on any therapy  Intolerance to statins - rosuvastatin  and atorvastatin   Discussed next potential options (Zetia  PCSK-9 inhibitors, bempedoic acid and inclisiran); cost, dosing efficacy, side effects  Dicussed importance healthy diet and regular exercise in detail   Plan: Start taking Zetia  10 mg by mouth daily and Repatha  140 mg under the skin every 14 days F/u lab due in 3 months      Thank you,  [name], Pharm.D [name]. [facility] [address], [place], [place] [address] Phone: [phone]; Fax: [phone]

## 2023-08-01 NOTE — Telephone Encounter (Signed)
 From pt calls  Pharmacy Patient Advocate Encounter   Received notification from Pt Calls Messages that prior authorization for REPATHA  140MG  is required/requested.   Insurance verification completed.   The patient is insured through [employer] .   Per test claim: PA required; PA submitted to above mentioned insurance via LAT[employer] Key/confirmation #/EOC [id number] Status is pending

## 2023-08-02 ENCOUNTER — Encounter: Payer: Self-pay | Admitting: Neurology

## 2023-08-02 ENCOUNTER — Ambulatory Visit: Admitting: Neurology

## 2023-08-02 VITALS — BP 158/97 | HR 84 | Ht 68.0 in | Wt 176.5 lb

## 2023-08-02 DIAGNOSIS — R569 Unspecified convulsions: Secondary | ICD-10-CM | POA: Diagnosis not present

## 2023-08-02 DIAGNOSIS — G4700 Insomnia, unspecified: Secondary | ICD-10-CM

## 2023-08-02 DIAGNOSIS — Z5181 Encounter for therapeutic drug level monitoring: Secondary | ICD-10-CM

## 2023-08-02 MED ORDER — TRAZODONE HCL 50 MG PO TABS: 50.0000 mg | ORAL_TABLET | Freq: Every day | ORAL | 0 refills | Status: DC

## 2023-08-02 MED ORDER — DIVALPROEX SODIUM 500 MG PO DR TAB: 500.0000 mg | DELAYED_RELEASE_TABLET | Freq: Two times a day (BID) | ORAL | 3 refills | Status: DC

## 2023-08-02 NOTE — Patient Instructions (Addendum)
 Continue with Depakote  500 mg twice daily, refill given Will obtain Depakote  level with CMP Trial of trazodone  as sleep aid Discontinue CBD Advised patient to stop drinking fluid after 6 PM Discussed driving restriction for the next 6 months.  He voiced understanding Return in 6 to 8 months or sooner if worse.

## 2023-08-02 NOTE — Progress Notes (Signed)
 [facility]  PATIENT: [name] DOB: 30-Jan-1955  REQUESTING CLINICIAN: Matthews Elida HERO, MD HISTORY FROM: Patient/Spouse  REASON FOR VISIT: New seizure   HISTORICAL  CHIEF COMPLAINT:  Chief Complaint  Patient presents with   New Patient (Initial Visit)    Pt in 13 with wife Pt here for seizures Pt states had first seizure two weeks ago Pt states very fatigue. Pt states not ablr to sleep at night     HISTORY OF PRESENT ILLNESS:  This is a 68 year old gentleman with medical history including hypertension, hyperlipidemia, renal cell carcinoma status post nephrectomy, atrial fibrillation on Eliquis  who is presenting after a seizure on July 13.  Seizure was witnessed by wife.  This was during sleep.  Wife heard him make a loud noise, extend both arms, and was having right arm jerking.  EMS was called and patient taken to the hospital.  In the hospital, he was noted to be combative and altered.  He did not bite his tongue and no urinary incontinence. His workup including MRI brain did not show any acute abnormality but his EEG showed right sided epileptiform discharges.  Patient was started on Depakote .  He is doing well on Depakote  500 mg twice daily, denies any side effect from the medication. His main complaint today is insomnia.  He does have a history of BPH, status post TURP, states that he wakes Multiple times during the night to sleep on top of that he does have difficulty staying asleep.  He has tried in the past Ambien and trazodone , but was unsuccessful.  He also had tried melatonin but he is taking it at bedtime and not a few hours before bedtime.  He denies any previous history, denies any family history of seizures or seizures and no other seizure risk factors other that using high amount of CBD.   Handedness: Right handed   Onset: July 13, out of sleep   Seizure Type: Generalized convulsion   Current frequency: Only once   Any injuries from  seizures: Denies   Seizure risk factors: High amount of CBD  Previous ASMs: None   Currenty ASMs: Valproic  acid   ASMs side effects: Denies   Brain Images: Old strokes, no acute abnormalities   Previous EEGs: Right parieto occipital epileptiform discharges    OTHER MEDICAL CONDITIONS: CKD, Kidney cancer, hypertension, hyperlipidemia, atrial fibrillation.  Previous strokes  REVIEW OF SYSTEMS: Full 14 system review of systems performed and negative with exception of: As noted in the HPI   ALLERGIES: No Known Allergies  HOME MEDICATIONS: Outpatient Medications Prior to Visit  Medication Sig Dispense Refill   desvenlafaxine (PRISTIQ) 50 MG 24 hr tablet      ezetimibe  (ZETIA ) 10 MG tablet Take 1 tablet (10 mg total) by mouth daily. 90 tablet 3   finasteride  (PROSCAR ) 5 MG tablet Take 5 mg by mouth daily.     losartan  (COZAAR ) 50 MG tablet Take 1 tablet (50 mg total) by mouth daily. 90 tablet 3   oxybutynin  (DITROPAN ) 5 MG tablet Take 5 mg by mouth at bedtime.     pantoprazole  (PROTONIX ) 40 MG tablet Take 40 mg by mouth at bedtime.     tadalafil (CIALIS) 20 MG tablet Take 20 mg by mouth daily as needed for erectile dysfunction.     divalproex  (DEPAKOTE ) 500 MG DR tablet Take 1 tablet (500 mg total) by mouth every 12 (twelve) hours. 180 tablet 1   amoxicillin  (AMOXIL ) 500 MG capsule Take 4 capsules (  2,000 mg total) by mouth as directed. 4 pills one hour before dental appointment (Patient not taking: Reported on 08/02/2023) 4 capsule 2   Evolocumab  (REPATHA  SURECLICK) 140 MG/ML SOAJ Inject 140 mg into the skin every 14 (fourteen) days. (Patient not taking: Reported on 08/02/2023) 6 mL 3   No facility-administered medications prior to visit.    PAST MEDICAL HISTORY: Past Medical History:  Diagnosis Date   Bacteremia 110/2017   Cancer (HCC)    renal mass at preop   Chronic kidney disease    CVA (cerebral infarction)    hemmorhagic  CVA   Depression    Dyslipidemia    Endocarditis     MV Vegitation   GERD (gastroesophageal reflux disease)    History of kidney stones    Hypertension    Mitral regurgitation    MRSA infection    2011   Myocardial infarction (HCC)    Stroke (HCC)     PAST SURGICAL HISTORY: Past Surgical History:  Procedure Laterality Date   CARDIAC CATHETERIZATION     CARDIOVERSION N/A 09/14/2020   Procedure: CARDIOVERSION;  Surgeon: Loni Soyla LABOR, MD;  Location: [facility] ENDOSCOPY;  Service: Cardiovascular;  Laterality: N/A;   MITRAL VALVE REPAIR     NO PAST SURGERIES     RIGHT/LEFT HEART CATH AND CORONARY ANGIOGRAPHY N/A 05/11/2020   Procedure: RIGHT/LEFT HEART CATH AND CORONARY ANGIOGRAPHY;  Surgeon: Wonda Sharper, MD;  Location: [facility] INVASIVE CV LAB;  Service: Cardiovascular;  Laterality: N/A;   ROBOT ASSISTED LAPAROSCOPIC NEPHRECTOMY Right 02/22/2016   Procedure: XI ROBOTIC ASSISTED LAPAROSCOPIC NEPHRECTOMY;  Surgeon: Ricardo Likens, MD;  Location: [facility] ORS;  Service: Urology;  Laterality: Right;   ROBOTIC ASSITED PARTIAL NEPHRECTOMY Left 06/07/2021   Procedure: XI ROBOTIC ASSITED PARTIAL NEPHRECTOMY;  Surgeon: Likens Ricardo, MD;  Location: [facility] ORS;  Service: Urology;  Laterality: Left;  3 HRS   TEE WITHOUT CARDIOVERSION N/A 01/28/2020   Procedure: TRANSESOPHAGEAL ECHOCARDIOGRAM (TEE);  Surgeon: Okey Vina GAILS, MD;  Location: [facility] ENDOSCOPY;  Service: Cardiovascular;  Laterality: N/A;   TONSILLECTOMY     TRANSURETHRAL RESECTION OF PROSTATE N/A 03/28/2022   Procedure: TRANSURETHRAL RESECTION OF THE PROSTATE (TURP);  Surgeon: Likens Ricardo, MD;  Location: [facility] ORS;  Service: Urology;  Laterality: N/A;    FAMILY HISTORY: Family History  Problem Relation Age of Onset   Heart disease Mother        mom RF as a child (died of heart problem in  2 )   Seizures Neg Hx     SOCIAL HISTORY: Social History   Socioeconomic History   Marital status: Married    Spouse name: Not on file   Number of children: 3   Years of education: 18   Highest  education level: Professional school degree (e.g., MD, DDS, DVM, JD)  Occupational History   Not on file  Tobacco Use   Smoking status: Never   Smokeless tobacco: Never  Vaping Use   Vaping status: Never Used  Substance and Sexual Activity   Alcohol  use: Yes    Alcohol /week: 7.0 standard drinks of alcohol     Types: 7 Glasses of wine per week   Drug use: Yes    Types: Marijuana    Comment: Takes CBD gummies before bedtime   Sexual activity: Yes  Other Topics Concern   Not on file  Social History Narrative   MarriedLawyer3 childrenNever smokedAlcohol use--yes   Retired    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 Strain: Not on file  Food Insecurity: No Food Insecurity (07/14/2023)   Hunger Vital Sign    Worried About Running Out of Food in the Last Year: Never true    Ran Out of Food in the Last Year: Never true  Transportation Needs: No Transportation Needs (07/14/2023)   PRAPARE - Administrator, Civil Service (Medical): No    Lack of Transportation (Non-Medical): No  Physical Activity: Not on file  Stress: Not on file  Social Connections: Unknown (07/14/2023)   Social Connection and Isolation Panel    Frequency of Communication with Friends and Family: Patient declined    Frequency of Social Gatherings with Friends and Family: Patient declined    Attends Religious Services: Patient declined    Database administrator or Organizations: Patient declined    Attends Banker Meetings: Patient declined    Marital Status: Married  Catering manager Violence: Not At Risk (07/14/2023)   Humiliation, Afraid, Rape, and Kick questionnaire    Fear of Current or Ex-Partner: No    Emotionally Abused: No    Physically Abused: No    Sexually Abused: No     PHYSICAL EXAM   GENERAL EXAM/CONSTITUTIONAL: Vitals:  Vitals:   08/02/23 0816  BP: (!) 158/97  Pulse: 84  Weight: 176 lb 8 oz (80.1 kg)  Height: 5' 8 (1.727 m)   Body mass index is 26.84  kg/m. Wt Readings from Last 3 Encounters:  08/02/23 176 lb 8 oz (80.1 kg)  07/14/23 186 lb 4.6 oz (84.5 kg)  03/07/23 186 lb 12.8 oz (84.7 kg)   Patient is in no distress; well developed, nourished and groomed; neck is supple  MUSCULOSKELETAL: Gait, strength, tone, movements noted in Neurologic exam below  NEUROLOGIC: MENTAL STATUS:      No data to display         awake, alert, oriented to person, place and time recent and remote memory intact normal attention and concentration language fluent, comprehension intact, naming intact fund of knowledge appropriate  CRANIAL NERVE:  2nd, 3rd, 4th, 6th - Visual fields full to confrontation, extraocular muscles intact, no nystagmus 5th - facial sensation symmetric 7th - facial strength symmetric 8th - hearing intact 9th - palate elevates symmetrically, uvula midline 11th - shoulder shrug symmetric 12th - tongue protrusion midline  MOTOR:  normal bulk and tone, full strength in the BUE, BLE  SENSORY:  normal and symmetric to light touch  COORDINATION:  finger-nose-finger, fine finger movements normal. There is bilateral action tremors   GAIT/STATION:  normal    DIAGNOSTIC DATA (LABS, IMAGING, TESTING) - I reviewed patient records, labs, notes, testing and imaging myself where available.  Lab Results  Component Value Date   WBC 11.1 (H) 07/14/2023   HGB 14.6 07/14/2023   HCT 43.2 07/14/2023   MCV 89.8 07/14/2023   PLT 208 07/14/2023      Component Value Date/Time   NA 139 07/14/2023 0654   NA 142 09/07/2020 1430   K 3.7 07/14/2023 0654   CL 106 07/14/2023 0654   CO2 18 (L) 07/14/2023 0654   GLUCOSE 119 (H) 07/14/2023 0654   BUN 27 (H) 07/14/2023 0654   BUN 17 09/07/2020 1430   CREATININE 2.33 (H) 07/14/2023 0654   CREATININE 2.04 (H) 06/10/2023 1009   CALCIUM  9.5 07/14/2023 0654   PROT 7.6 07/14/2023 0654   PROT 6.5 03/07/2022 0817   ALBUMIN 4.4 07/14/2023 0654   ALBUMIN 4.3 03/07/2022 0817   AST 25  07/14/2023 0654  AST 13 (L) 06/10/2023 1009   ALT 14 07/14/2023 0654   ALT 9 06/10/2023 1009   ALKPHOS 69 07/14/2023 0654   BILITOT 0.9 07/14/2023 0654   BILITOT 0.6 06/10/2023 1009   GFRNONAA 30 (L) 07/14/2023 0654   GFRNONAA 35 (L) 06/10/2023 1009   GFRAA 54 (L) 01/26/2020 0848   Lab Results  Component Value Date   CHOL 299 (H) 07/14/2023   HDL 52 07/14/2023   LDLCALC 221 (H) 07/14/2023   LDLDIRECT 164.1 01/19/2010   TRIG 132 07/14/2023   Lab Results  Component Value Date   HGBA1C 5.8 (H) 03/07/2022   Lab Results  Component Value Date   VITAMINB12 238 07/14/2023   Lab Results  Component Value Date   TSH 4.268 07/14/2023    MRI Brain 07/14/2023 1. No acute intracranial abnormality. 2. 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 3. Sequelae of prior hemorrhage in the right subinsular region and scattered foci of hemosiderin staining in cerebral hemispheres, suggestive of prior hemorrhage.  EEG 07/14/2023 - Sharp wave, right parieto-occipital - Intermittent slow, generalized and maximal right parieto-occipital   I personally reviewed brain Images and previous EEG reports.   ASSESSMENT AND PLAN  68 y.o. year old male  with history of hypertension, hyperlipidemia, atrial fibrillation, renal cell carcinoma status post nephrectomy who is presenting after his first lifetime seizure.  This was a generalized convulsion.  His EEG showed epileptiform discharges in the right parietal occipital region and MRI did not show any acute abnormality.  Patient was started on Depakote  500 mg twice daily, denies any side effect from the medication.  Plan will be for patient to continue with Depakote  500 mg twice daily, I will check a level with CMP.  We also discussed driving restriction for next 6 months and he voiced understanding. In terms of his insomnia, we will try him on low-dose trazodone , we discussed sleep  hygiene, advised him to not read his Kindle in bed.  Most importantly I also advised him to discontinue all CBD. He understanding to stop drinking fluid after 6 PM. He voiced understanding.  Return in 6 to 8 months or sooner if worse.   1. Focal seizure (HCC)   2. Therapeutic drug monitoring   3. Insomnia, unspecified type     Patient Instructions  Continue with Depakote  500 mg twice daily, refill given Will obtain Depakote  level with CMP Trial of trazodone  as sleep aid Discontinue CBD Advised patient to stop drinking fluid after 6 PM Discussed driving restriction for the next 6 months.  He voiced understanding Return in 6 to 8 months or sooner if worse.   Per [place]  DMV statutes, patients with seizures are not allowed to drive until they have been seizure-free for six months.  Other recommendations include using caution when using heavy equipment or power tools. Avoid working on ladders or at heights. Take showers instead of baths.  Do not swim alone.  Ensure the water  temperature is not too high on the home water  heater. Do not go swimming alone. Do not lock yourself in a room alone (i.e. bathroom). When caring for infants or small children, sit down when holding, feeding, or changing them to minimize risk of injury to the child in the event you have a seizure. Maintain good sleep hygiene. Avoid alcohol .  Also recommend adequate sleep, hydration, good diet and minimize stress.   During the Seizure  - First,  ensure adequate ventilation and place patients on the floor on their left side  Loosen clothing around the neck and ensure the airway is patent. If the patient is clenching the teeth, do not force the mouth open with any object as this can cause severe damage - Remove all items from the surrounding that can be hazardous. The patient may be oblivious to what's happening and may not even know what he or she is doing. If the patient is confused and wandering, either gently guide  him/her away and block access to outside areas - Reassure the individual and be comforting - Call 911. In most cases, the seizure ends before EMS arrives. However, there are cases when seizures may last over 3 to 5 minutes. Or the individual may have developed breathing difficulties or severe injuries. If a pregnant patient or a person with diabetes develops a seizure, it is prudent to call an ambulance. - Finally, if the patient does not regain full consciousness, then call EMS. Most patients will remain confused for about 45 to 90 minutes after a seizure, so you must use judgment in calling for help. - Avoid restraints but make sure the patient is in a bed with padded side rails - Place the individual in a lateral position with the neck slightly flexed; this will help the saliva drain from the mouth and prevent the tongue from falling backward - Remove all nearby furniture and other hazards from the area - Provide verbal assurance as the individual is regaining consciousness - Provide the patient with privacy if possible - Call for help and start treatment as ordered by the caregiver   After the Seizure (Postictal Stage)  After a seizure, most patients experience confusion, fatigue, muscle pain and/or a headache. Thus, one should permit the individual to sleep. For the next few days, reassurance is essential. Being calm and helping reorient the person is also of importance.  Most seizures are painless and end spontaneously. Seizures are not harmful to others but can lead to complications such as stress on the lungs, brain and the heart. Individuals with prior lung problems may develop labored breathing and respiratory distress.    Discussed Patients with epilepsy have a small risk of sudden unexpected death, a condition referred to as sudden unexpected death in epilepsy (SUDEP). SUDEP is defined specifically as the sudden, unexpected, witnessed or unwitnessed, nontraumatic and nondrowning death in  patients with epilepsy with or without evidence for a seizure, and excluding documented status epilepticus, in which post mortem examination does not reveal a structural or toxicologic cause for death     Orders Placed This Encounter  Procedures   Valproic  Acid Level   CMP    Meds ordered this encounter  Medications   traZODone  (DESYREL ) 50 MG tablet    Sig: Take 1 tablet (50 mg total) by mouth at bedtime.    Dispense:  15 tablet    Refill:  0   divalproex  (DEPAKOTE ) 500 MG DR tablet    Sig: Take 1 tablet (500 mg total) by mouth every 12 (twelve) hours.    Dispense:  180 tablet    Refill:  3    Return in about 6 months (around 02/02/2024).    Pastor Falling, MD 08/02/2023, 9:02 AM  [facility] [address] [place], [place] [address] [phone]

## 2023-08-03 LAB — COMPREHENSIVE METABOLIC PANEL WITH GFR
ALT: 14 IU/L (ref 0–44)
AST: 17 IU/L (ref 0–40)
Albumin: 4.7 g/dL (ref 3.9–4.9)
Alkaline Phosphatase: 68 IU/L (ref 44–121)
BUN/Creatinine Ratio: 12 (ref 10–24)
BUN: 22 mg/dL (ref 8–27)
Bilirubin Total: 0.5 mg/dL (ref 0.0–1.2)
CO2: 17 mmol/L — ABNORMAL LOW (ref 20–29)
Calcium: 9.3 mg/dL (ref 8.6–10.2)
Chloride: 105 mmol/L (ref 96–106)
Creatinine, Ser: 1.79 mg/dL — ABNORMAL HIGH (ref 0.76–1.27)
Globulin, Total: 2.2 g/dL (ref 1.5–4.5)
Glucose: 95 mg/dL (ref 70–99)
Potassium: 4.5 mmol/L (ref 3.5–5.2)
Sodium: 140 mmol/L (ref 134–144)
Total Protein: 6.9 g/dL (ref 6.0–8.5)
eGFR: 41 mL/min/1.73 — ABNORMAL LOW (ref >59)

## 2023-08-03 LAB — VALPROIC ACID LEVEL: Valproic Acid Lvl: 59 ug/mL (ref 50–100)

## 2023-08-04 ENCOUNTER — Ambulatory Visit: Payer: Self-pay | Admitting: Neurology

## 2023-08-07 ENCOUNTER — Encounter: Payer: Self-pay | Admitting: Neurology

## 2023-08-07 NOTE — Telephone Encounter (Signed)
 Repatha PA approved see other encounter.

## 2023-08-09 ENCOUNTER — Other Ambulatory Visit: Payer: Self-pay | Admitting: Neurology

## 2023-08-12 ENCOUNTER — Other Ambulatory Visit: Payer: Self-pay | Admitting: Neurology

## 2023-08-12 MED ORDER — TRAZODONE HCL 100 MG PO TABS: 100.0000 mg | ORAL_TABLET | Freq: Every day | ORAL | 6 refills | Status: DC

## 2023-08-13 NOTE — Progress Notes (Signed)
 Cardiology Office Note   Date:  08/15/2023   ID:  [name], DOB 14-Feb-1955, MRN [id number]  PCP:  Charlott Dorn LABOR, MD  Cardiologist:   Vina Gull, MD    F/U of MV dz        History of Present Illness: [name] is a 68 y.o. male with a history of  MVP and mitral regurgitation.    Feb 2010  Pt had viral illness  Presented to [facility] with CP, elevated troponin Felt to have acute coronary syndrome LHC showed dissection of prox LAD  Rx ASA and Plavix.   Post procedure he developed   L sided weakness, aphasia.  MRI with temporal infarct R.  Received TPA. MRI showed diffuse strokes felt to be embolic Follow up CT showed hemorrhagic  conversion.     Found to have strep gordonii bacteremia.  Echo showed no definite vegitation but felt to still felt to be endocarditis with embolic events    Treated with ABX   Admitted to inpatient rehab   Followed by ID after     2017  Admitted for sepsis.  Echo and TEE showed bileaflet prolapse  Several jets of MR  NO vegetation  During admit, pt incidentally found to have renal mass Underwent nephrectomy 2018  2021/2022 TTE and TEE showed MVP and severe MR 2022 LHC showed no significant CAD AUg 2022 PT underwent  MV repair at [facility] (Dr Ricky)   Post procedure developed afib   Started on amiodarone and Eliquis    Underwent cardioversion on 09/14/20   Amiodarone stopped and then Eliquis  stopped   2023:  Pt underwent removal of L renal mass.    July 2024  PT hospitalized with focal seizure    Rx Depakote      Since recent discharge the pt says he has been fatigued   Not sleeping well even with Trazodone  being added.   He is being set up for a sleep study by neuro   Denies CP  Breathing is OK  NO palpitations   Outpatient Medications Prior to Visit  Medication Sig Dispense Refill   amoxicillin  (AMOXIL ) 500 MG capsule Take 4 capsules (2,000 mg total) by mouth as directed. 4 pills one hour before dental appointment 4 capsule 2    desvenlafaxine (PRISTIQ) 50 MG 24 hr tablet      divalproex  (DEPAKOTE ) 500 MG DR tablet Take 1 tablet (500 mg total) by mouth every 12 (twelve) hours. 180 tablet 3   Evolocumab  (REPATHA  SURECLICK) 140 MG/ML SOAJ Inject 140 mg into the skin every 14 (fourteen) days. 6 mL 3   ezetimibe  (ZETIA ) 10 MG tablet Take 1 tablet (10 mg total) by mouth daily. 90 tablet 3   finasteride  (PROSCAR ) 5 MG tablet Take 5 mg by mouth daily.     losartan  (COZAAR ) 50 MG tablet Take 1 tablet (50 mg total) by mouth daily. 90 tablet 3   oxybutynin  (DITROPAN ) 5 MG tablet Take 5 mg by mouth at bedtime.     pantoprazole  (PROTONIX ) 40 MG tablet Take 40 mg by mouth at bedtime.     tadalafil (CIALIS) 20 MG tablet Take 20 mg by mouth daily as needed for erectile dysfunction.     traZODone  (DESYREL ) 100 MG tablet Take 1 tablet (100 mg total) by mouth at bedtime. 30 tablet 6   No facility-administered medications prior to visit.     Allergies:   Patient has no known allergies.   Past Medical History:  Diagnosis Date  Bacteremia 110/2017   Cancer (HCC)    renal mass at preop   Chronic kidney disease    CVA (cerebral infarction)    hemmorhagic  CVA   Depression    Dyslipidemia    Endocarditis    MV Vegitation   GERD (gastroesophageal reflux disease)    History of kidney stones    Hypertension    Mitral regurgitation    MRSA infection    2011   Myocardial infarction [facility])    Stroke [facility])     Past Surgical History:  Procedure Laterality Date   CARDIAC CATHETERIZATION     CARDIOVERSION N/A 09/14/2020   Procedure: CARDIOVERSION;  Surgeon: Loni Soyla LABOR, MD;  Location: [facility] ENDOSCOPY;  Service: Cardiovascular;  Laterality: N/A;   MITRAL VALVE REPAIR     NO PAST SURGERIES     RIGHT/LEFT HEART CATH AND CORONARY ANGIOGRAPHY N/A 05/11/2020   Procedure: RIGHT/LEFT HEART CATH AND CORONARY ANGIOGRAPHY;  Surgeon: Wonda Sharper, MD;  Location: [facility] INVASIVE CV LAB;  Service: Cardiovascular;  Laterality: N/A;    ROBOT ASSISTED LAPAROSCOPIC NEPHRECTOMY Right 02/22/2016   Procedure: XI ROBOTIC ASSISTED LAPAROSCOPIC NEPHRECTOMY;  Surgeon: Ricardo Likens, MD;  Location: [facility] ORS;  Service: Urology;  Laterality: Right;   ROBOTIC ASSITED PARTIAL NEPHRECTOMY Left 06/07/2021   Procedure: XI ROBOTIC ASSITED PARTIAL NEPHRECTOMY;  Surgeon: Likens Ricardo, MD;  Location: [facility] ORS;  Service: Urology;  Laterality: Left;  3 HRS   TEE WITHOUT CARDIOVERSION N/A 01/28/2020   Procedure: TRANSESOPHAGEAL ECHOCARDIOGRAM (TEE);  Surgeon: Okey Vina GAILS, MD;  Location: [facility] ENDOSCOPY;  Service: Cardiovascular;  Laterality: N/A;   TONSILLECTOMY     TRANSURETHRAL RESECTION OF PROSTATE N/A 03/28/2022   Procedure: TRANSURETHRAL RESECTION OF THE PROSTATE (TURP);  Surgeon: Likens Ricardo, MD;  Location: [facility] ORS;  Service: Urology;  Laterality: N/A;     Social History:  The patient  reports that he has never smoked. He has never used smokeless tobacco. He reports current alcohol  use of about 7.0 standard drinks of alcohol  per week. He reports current drug use. Drug: Marijuana.   Family History:  The patient's family history includes Heart disease in his mother.    ROS:  Please see the history of present illness. All other systems are reviewed and  Negative to the above problem except as noted.    PHYSICAL EXAM: VS:  BP (!) 164/97   Pulse 73   Ht 5' 8 (1.727 m)   Wt 174 lb 6.4 oz (79.1 kg)   SpO2 99%   BMI 26.52 kg/m   GEN: Obese 68 yo in no acute distress  HEENT: normal  Neck: JVP normal Cardiac: RRR; No murmur Respiratory:  clear to auscultation  GI: soft, nontender  No hepatomegaly  Ext  No edema   EKG:  EKG not done   July 2025   1. Left ventricular ejection fraction, by estimation, is 55 to 60%. The  left ventricle has normal function. Left ventricular endocardial border  not optimally defined to evaluate regional wall motion. Left ventricular  diastolic function could not be  evaluated. Elevated left atrial  pressure. The E/e' is 23.   2. Right ventricular systolic function is normal. The right ventricular  size is normal.   3. S/P Mitral valve repair w/ 40mm Simulus ring, implant 08/12/2020, no  valvular regurgitation, no PVL, and visually no mitral stenosis (doppler  data was not acquired).   4. The aortic valve is tricuspid. Aortic valve regurgitation is mild to  moderate. Aortic valve  sclerosis is present, with no evidence of aortic  valve stenosis.   Comparison(s): A prior study was performed on 11/27/2022. Reported LVEF  55-60%, moderate LAE, s/p MV repair MG (mild MS), mild MR, mild to  moderate AR, aortic root 42mm, estimated RAP .   Conclusion(s)/Recommendation(s): No intracardiac source of embolism  detected on this transthoracic study. Consider a transesophageal  echocardiogram to exclude cardiac source of embolism if clinically  indicated.    Echo   05/3021  Left ventricular ejection fraction, by estimation, is 55%%. The left ventricle has normal function. The left ventricle has no regional wall motion abnormalities. Left ventricular diastolic parameters are consistent with Grade I diastolic dysfunction (impaired relaxation). Elevated left atrial pressure. 1. 2. Right ventricular systolic function is normal. The right ventricular size is normal. 3. Left atrial size was mild to moderately dilated. 4. Right atrial size was mild to moderately dilated. S/p MV repair. Mean gradient through the valve is 3 mm Hg. . The mitral valve has been repaired/replaced. Trivial mitral valve regurgitation. 5. The aortic valve is tricuspid. Aortic valve regurgitation is mild. Aortic valve sclerosis/calcification is present, without any evidence of aortic stenosis. 6. Aortic dilatation noted. There is mild dilatation of the aortic root, measuring 43 mm. There is borderline dilatation of the ascending aorta, measuring 38 mm. 7. Comparison(s): EF 55%, mild AI, AOR 40mm, MVP with  mod-severe MR.   R and L heart cath May 2022  1.  Widely patent coronary arteries with minimal irregularity, right dominant 2.  Normal/low intracardiac filling pressures with no evidence of pulmonary hypertension 3.  Preserved cardiac output   Lipid Panel    Component Value Date/Time   CHOL 299 (H) 07/14/2023 1429   CHOL 151 12/13/2017 1147   TRIG 132 07/14/2023 1429   HDL 52 07/14/2023 1429   HDL 53 12/13/2017 1147   CHOLHDL 5.8 07/14/2023 1429   VLDL 26 07/14/2023 1429   LDLCALC 221 (H) 07/14/2023 1429   LDLCALC 75 12/13/2017 1147   LDLDIRECT 164.1 01/19/2010 0904      Wt Readings from Last 3 Encounters:  08/15/23 174 lb 6.4 oz (79.1 kg)  08/02/23 176 lb 8 oz (80.1 kg)  07/14/23 186 lb 4.6 oz (84.5 kg)      ASSESSMENT AND PLAN:  1.  Mitral valve disease.Pt s/p MV repair at [facility] in 2022  Excellent result    Follow   Abx prophylaxis    2Hx of PAF  Pt only had afib post valve surgery in 2022  Rx amiodarone transiently  Cardioverted   Now off of meds   No recurrence   3.  Hypertension.BP is high today   150/80 on my check   He and wife say it has been running high    I would add amlodipine  2.5 mg to regimen    He has an appt with J Coladonato in a few wks      4 Hx coronary dissection.   2010   Unable to see / find recoreds   (before Epic)    ALl in setting of bacteremia, endocarditis>    Last cath in 2022 prior to MV surgery, coronaries were patent   5 Aortic size  Dilated on one echo   Last echo 07/2023  aortic root and asc aorta normal    6  HL  Pt intolerant to statins   Has profound dyslipidemia  LDL 221  HDL 52  Trig 132   Wife says he was eating  out every day.prior to admit   He is doing much better now      He is now on Zetia  and just started Repatha    Will follow up with labs this fall Keep working on diet   WIll taper meds as cholesterol allows   7.  Renal. S/p nephrectomy and partial nephrecomy   Last Cr 2.3    Follows with Drs [facility] and [facility]    8   Neuro  On Depakote  for seizure  9  Sleep    Being set up for a sleep study   On trazodone    Discussed CBT -I apps for help   F/U in clinic in March   Sooner if BP does not improve   Current medicines are reviewed at length with the patient today.  The patient does not have concerns regarding medicines.  Signed, Vina Gull, MD  08/15/2023 3:16 PM    [facility] [address] [place], [place], [place]  [address] Phone: [phone]; Fax: [phone]

## 2023-08-15 ENCOUNTER — Ambulatory Visit: Attending: Internal Medicine | Admitting: Internal Medicine

## 2023-08-15 ENCOUNTER — Other Ambulatory Visit (HOSPITAL_COMMUNITY): Payer: Self-pay

## 2023-08-15 VITALS — BP 164/97 | HR 73 | Ht 68.0 in | Wt 174.4 lb

## 2023-08-15 DIAGNOSIS — I10 Essential (primary) hypertension: Secondary | ICD-10-CM | POA: Diagnosis not present

## 2023-08-15 MED ORDER — AMLODIPINE BESYLATE 2.5 MG PO TABS
2.5000 mg | ORAL_TABLET | Freq: Every day | ORAL | 3 refills | Status: AC
  Filled 2023-08-15: qty 90, 90d supply, fill #0

## 2023-08-15 NOTE — Patient Instructions (Signed)
 Medication Instructions:  Your physician has recommended you make the following change in your medication:   1) START amlodipine  2.5 mg daily  *If you need a refill on your cardiac medications before your next appointment, please call your pharmacy*  Follow-Up: At [facility], you and your health needs are our priority.  As part of our continuing mission to provide you with exceptional heart care, our providers are all part of one team.  This team includes your primary Cardiologist (physician) and Advanced Practice Providers or APPs (Physician Assistants and Nurse Practitioners) who all work together to provide you with the care you need, when you need it.  Your next appointment:   7 month(s)  Provider:   Vina Gull, MD  We recommend signing up for the patient portal called MyChart.  Sign up information is provided on this After Visit Summary.  MyChart is used to connect with patients for Virtual Visits (Telemedicine).  Patients are able to view lab/test results, encounter notes, upcoming appointments, etc.  Non-urgent messages can be sent to your provider as well.    To learn more about what you can do with MyChart, go to [email or web address].

## 2023-08-16 ENCOUNTER — Encounter: Payer: Self-pay | Admitting: Internal Medicine

## 2023-08-21 ENCOUNTER — Other Ambulatory Visit (HOSPITAL_COMMUNITY): Payer: Self-pay

## 2023-08-22 ENCOUNTER — Other Ambulatory Visit (HOSPITAL_COMMUNITY): Payer: Self-pay

## 2023-08-26 DIAGNOSIS — D631 Anemia in chronic kidney disease: Secondary | ICD-10-CM | POA: Diagnosis not present

## 2023-08-26 DIAGNOSIS — E7800 Pure hypercholesterolemia, unspecified: Secondary | ICD-10-CM | POA: Diagnosis not present

## 2023-08-26 DIAGNOSIS — I4891 Unspecified atrial fibrillation: Secondary | ICD-10-CM | POA: Diagnosis not present

## 2023-08-26 DIAGNOSIS — C641 Malignant neoplasm of right kidney, except renal pelvis: Secondary | ICD-10-CM | POA: Diagnosis not present

## 2023-08-26 DIAGNOSIS — D6869 Other thrombophilia: Secondary | ICD-10-CM | POA: Diagnosis not present

## 2023-08-26 DIAGNOSIS — I10 Essential (primary) hypertension: Secondary | ICD-10-CM | POA: Diagnosis not present

## 2023-08-26 DIAGNOSIS — Z0000 Encounter for general adult medical examination without abnormal findings: Secondary | ICD-10-CM | POA: Diagnosis not present

## 2023-08-26 DIAGNOSIS — G40909 Epilepsy, unspecified, not intractable, without status epilepticus: Secondary | ICD-10-CM | POA: Diagnosis not present

## 2023-08-26 DIAGNOSIS — N400 Enlarged prostate without lower urinary tract symptoms: Secondary | ICD-10-CM | POA: Diagnosis not present

## 2023-08-26 DIAGNOSIS — N1832 Chronic kidney disease, stage 3b: Secondary | ICD-10-CM | POA: Diagnosis not present

## 2023-08-26 DIAGNOSIS — R918 Other nonspecific abnormal finding of lung field: Secondary | ICD-10-CM | POA: Diagnosis not present

## 2023-08-26 DIAGNOSIS — I77810 Thoracic aortic ectasia: Secondary | ICD-10-CM | POA: Diagnosis not present

## 2023-08-26 DIAGNOSIS — R911 Solitary pulmonary nodule: Secondary | ICD-10-CM | POA: Diagnosis not present

## 2023-08-26 DIAGNOSIS — H6121 Impacted cerumen, right ear: Secondary | ICD-10-CM | POA: Diagnosis not present

## 2023-08-26 DIAGNOSIS — N1831 Chronic kidney disease, stage 3a: Secondary | ICD-10-CM | POA: Diagnosis not present

## 2023-08-26 DIAGNOSIS — I69319 Unspecified symptoms and signs involving cognitive functions following cerebral infarction: Secondary | ICD-10-CM | POA: Diagnosis not present

## 2023-08-26 DIAGNOSIS — F4320 Adjustment disorder, unspecified: Secondary | ICD-10-CM | POA: Diagnosis not present

## 2023-08-26 DIAGNOSIS — I129 Hypertensive chronic kidney disease with stage 1 through stage 4 chronic kidney disease, or unspecified chronic kidney disease: Secondary | ICD-10-CM | POA: Diagnosis not present

## 2023-08-28 ENCOUNTER — Other Ambulatory Visit: Payer: Self-pay | Admitting: Internal Medicine

## 2023-09-04 ENCOUNTER — Other Ambulatory Visit: Payer: Self-pay | Admitting: Neurology

## 2023-09-10 ENCOUNTER — Inpatient Hospital Stay

## 2023-09-10 ENCOUNTER — Ambulatory Visit: Payer: Self-pay

## 2023-09-10 ENCOUNTER — Ambulatory Visit (HOSPITAL_COMMUNITY)
Admission: RE | Admit: 2023-09-10 | Discharge: 2023-09-10 | Disposition: A | Discharge status: Home / Self Care | Source: Ambulatory Visit

## 2023-09-10 DIAGNOSIS — I700 Atherosclerosis of aorta: Secondary | ICD-10-CM | POA: Diagnosis not present

## 2023-09-10 DIAGNOSIS — K5730 Diverticulosis of large intestine without perforation or abscess without bleeding: Secondary | ICD-10-CM | POA: Diagnosis not present

## 2023-09-10 DIAGNOSIS — K7689 Other specified diseases of liver: Secondary | ICD-10-CM | POA: Diagnosis not present

## 2023-09-10 DIAGNOSIS — Z85528 Personal history of other malignant neoplasm of kidney: Secondary | ICD-10-CM | POA: Insufficient documentation

## 2023-09-10 DIAGNOSIS — R918 Other nonspecific abnormal finding of lung field: Secondary | ICD-10-CM | POA: Diagnosis not present

## 2023-09-10 DIAGNOSIS — C649 Malignant neoplasm of unspecified kidney, except renal pelvis: Secondary | ICD-10-CM

## 2023-09-10 DIAGNOSIS — Z905 Acquired absence of kidney: Secondary | ICD-10-CM | POA: Diagnosis not present

## 2023-09-10 DIAGNOSIS — N1832 Chronic kidney disease, stage 3b: Secondary | ICD-10-CM | POA: Insufficient documentation

## 2023-09-10 LAB — CMP (CANCER CENTER ONLY)
ALT: 11 U/L (ref 0–44)
AST: 15 U/L (ref 15–41)
Albumin: 4.7 g/dL (ref 3.5–5.0)
Alkaline Phosphatase: 54 U/L (ref 38–126)
Anion gap: 5 (ref 5–15)
BUN: 32 mg/dL — ABNORMAL HIGH (ref 8–23)
CO2: 28 mmol/L (ref 22–32)
Calcium: 9.2 mg/dL (ref 8.9–10.3)
Chloride: 107 mmol/L (ref 98–111)
Creatinine: 1.76 mg/dL — ABNORMAL HIGH (ref 0.61–1.24)
GFR, Estimated: 42 mL/min — ABNORMAL LOW (ref >60)
Glucose, Bld: 90 mg/dL (ref 70–99)
Potassium: 4.6 mmol/L (ref 3.5–5.1)
Sodium: 140 mmol/L (ref 135–145)
Total Bilirubin: 0.5 mg/dL (ref 0.0–1.2)
Total Protein: 7.3 g/dL (ref 6.5–8.1)

## 2023-09-10 LAB — CBC WITH DIFFERENTIAL (CANCER CENTER ONLY)
Abs Immature Granulocytes: 0.04 K/uL (ref 0.00–0.07)
Basophils Absolute: 0 K/uL (ref 0.0–0.1)
Basophils Relative: 0 %
Eosinophils Absolute: 0.1 K/uL (ref 0.0–0.5)
Eosinophils Relative: 2 %
HCT: 39.7 % (ref 39.0–52.0)
Hemoglobin: 13.6 g/dL (ref 13.0–17.0)
Immature Granulocytes: 1 %
Lymphocytes Relative: 14 %
Lymphs Abs: 0.8 K/uL (ref 0.7–4.0)
MCH: 30.7 pg (ref 26.0–34.0)
MCHC: 34.3 g/dL (ref 30.0–36.0)
MCV: 89.6 fL (ref 80.0–100.0)
Monocytes Absolute: 0.7 K/uL (ref 0.1–1.0)
Monocytes Relative: 13 %
Neutro Abs: 4 K/uL (ref 1.7–7.7)
Neutrophils Relative %: 70 %
Platelet Count: 220 K/uL (ref 150–400)
RBC: 4.43 MIL/uL (ref 4.22–5.81)
RDW: 13.3 % (ref 11.5–15.5)
WBC Count: 5.7 K/uL (ref 4.0–10.5)
nRBC: 0 % (ref 0.0–0.2)

## 2023-09-10 LAB — LACTATE DEHYDROGENASE: LDH: 255 U/L — ABNORMAL HIGH (ref 98–192)

## 2023-09-11 ENCOUNTER — Other Ambulatory Visit (HOSPITAL_COMMUNITY): Payer: Self-pay

## 2023-09-16 ENCOUNTER — Other Ambulatory Visit: Payer: Self-pay | Admitting: Neurology

## 2023-09-16 MED ORDER — DIVALPROEX SODIUM ER 500 MG PO TB24: 1000.0000 mg | ORAL_TABLET | Freq: Every evening | ORAL | 3 refills | Status: DC

## 2023-09-16 NOTE — Progress Notes (Signed)
 Depakote  Dr caused worsening tremors, will switch to Depakote  XR

## 2023-09-18 NOTE — Progress Notes (Unsigned)
 [facility] OFFICE PROGRESS NOTE  Patient Care Team: Charlott Dorn LABOR, MD as PCP - General (Internal Medicine) Okey Vina GAILS, MD as PCP - Cardiology (Cardiology)  Yahir is a 68 y.o.male with history of CVA, HTN, MI, MVR and bilateral RCC being seen at [facility].   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   Staging from 02/2023 showed one RLL nodule of 9 mm, small mediastinal lymph nodes. Three other nodules are stable from 2022 suggesting benign.  Repeat CT in 06/2023 shows stable pulmonary nodules.  Repeat CT in 09/2023 shows stable right lower lobe nodule.  Stable left upper lobe nodule.  MRI of the brain from 02/2023 and 7/2025showed no malignancy Assessment & Plan Renal cell carcinoma, unspecified laterality (HCC) No new concerning findings on imaging Will repeat labs and CT in February Pulmonary nodules Stable pulmonary nodules Repeat CT in February Stage 3b chronic kidney disease (HCC) Fluid 60-70 oz per day Stable.  Will use contrast next time.  Advised to increase fluid around times of IV contrast.  Orders Placed This Encounter  Procedures   CT CHEST ABDOMEN PELVIS W CONTRAST    Standing Status:   Future    Expected Date:   02/24/2024    Expiration Date:   09/18/2024    If indicated for the ordered procedure, I authorize the administration of contrast media per Radiology protocol:   Yes    Does the patient have a contrast media/X-ray dye allergy?:   Yes    Preferred imaging location?:   [facility]    If indicated for the ordered procedure, I authorize the administration of oral contrast media per Radiology protocol:   Yes   CBC with Differential (Cancer Center Only)    Standing Status:   Future    Expiration Date:   09/18/2024   CMP (Cancer Center only)    Standing Status:   Future    Expiration Date:   09/18/2024   Lactate dehydrogenase    Standing  Status:   Future    Expiration Date:   09/18/2024     Pauletta JAYSON Chihuahua, MD  INTERVAL HISTORY: Patient returns for follow-up.  Overall feeling well.  Report of seizure and currently on antiseizure medication.  Recent MRI did not show any malignancy.  Thought to related to his sleep and he does not sleep well.  No new systemic symptoms.  Oncology History  Renal cell carcinoma [facility])   Initial Diagnosis   Malignant neoplasm of renal pelvis (HCC)   02/22/2016 Pathology Results   Kidney, radical nephrectomy for tumor, right - CLEAR CELL RENAL CELL CARCINOMA, FUHRMAN NUCLEAR GRADE 2, SPANNING 4 CM IN GREATEST DIMENSION. - TUMOR IS CONFINED TO RENAL PARENCHYMA. - MARGINS ARE NEGATIVE. - SEE ONCOLOGY TEMPLATE. Microscopic Comment KIDNEY Specimen, including laterality: Right kidney. Procedure: Right robotic radical nephrectomy. Tumor focality: Right. Maximum tumor size (cm): 4 cm. Macroscopic extent of tumor: Tumor confined to kidney. Histologic type: Clear cell renal cell carcinoma. If sarcomatoid features give percentage: No sarcomatoid features identified. Fuhrman Nuclear Grade: 2. Margins (renal vein, ureter, soft tissue): Negative. Renal vein invasion: Not identified. Microscopic tumor extension: Tumor confined to kidney. Adrenal gland: Not received. Lymph nodes: No lymph nodes received. TNM code: pT1a, pNX.   06/07/2021 Pathology Results   A. KIDNEY, LEFT, MASS, PARTIAL NEPHRECTOMY:  Clear cell renal cell carcinoma, nuclear grade  3, sized 2.8 cm  Tumor is limited to the kidney (pT1a)  All margins of resection are negative for tumor   KIDNEY:  Procedure: Partial Nephrectomy  Specimen Laterality: Left  Tumor Location: Lower pole  Tumor Size: 2.8 cm  Histologic Type: Clear cell renal cell carcinoma  Sarcomatoid Features: Negative  Rhabdoid Features: Negative  Histologic Grade (WHO/ISUP grade): G3  Tumor Necrosis: Negative  Tumor Extension: Organ confined  Margins: Negative   Regional Lymph Nodes:       No lymph nodes submitted or found: X       Number of Lymph Nodes Involved: NA       Number of Lymph Nodes Examined: NA  Distant Metastasis: NA  Pathologic Stage Classification (pTNM, AJCC 8th Edition):  pT1a,  pN  Pathologic Findings in Nonneoplastic Kidney: interstitial chronic  inflammation, peritubular fibrosis and obstructive changes.    03/28/2022 Pathology Results   A. PROSTATE CHIPS, TRANSURETHRAL RESECTION:  Benign nodular glandular and stromal hyperplasia (15 g)  Focal acute and chronic prostatitis  Chronic and follicular urethritis  Negative for carcinoma    01/17/2023 Imaging   CT ABD RLL 8 mm nodule Right nephrectomy Partial left nephrectomy. No nodularity or growth along margin. No enhancing lesion.   02/07/2023 Cancer Staging   Staging form: Kidney, AJCC 7th Edition - Clinical: Stage I (T1a, N0, M0) - Signed by Tina Pauletta BROCKS, MD on 02/07/2023 Histologic grade (G): G3 Sarcomatoid features: Absent   06/03/2023 Imaging   CT CAP Stable pulmonary nodules measuring up to 8 mm.   Status post right nephrectomy and partial left nephrectomy with no evidence for malignancy within the abdomen or pelvis.      PHYSICAL EXAMINATION: ECOG PERFORMANCE STATUS: 1 - Symptomatic but completely ambulatory  Vitals:   09/19/23 1200  BP: 139/71  Pulse: 73  Resp: 18  Temp: (!) 97.5 F (36.4 C)  SpO2: 99%   Filed Weights   09/19/23 1200  Weight: 176 lb 14.4 oz (80.2 kg)    GENERAL: alert, no distress and comfortable SKIN: skin color normal NECK: No palpable mass LYMPH:  no palpable cervical lymphadenopathy  LUNGS: clear to auscultation and normal breathing effort HEART: regular rate & rhythm  ABDOMEN: abdomen soft, non-tender and nondistended. Musculoskeletal: no edema NEURO: no focal motor/sensory deficits  Relevant data reviewed during this visit included labs and imaging.  I personally reviewed his imaging.  New labs and imaging  ordered.

## 2023-09-19 ENCOUNTER — Inpatient Hospital Stay

## 2023-09-19 VITALS — BP 139/71 | HR 73 | Temp 97.5°F | Resp 18 | Wt 176.9 lb

## 2023-09-19 DIAGNOSIS — R918 Other nonspecific abnormal finding of lung field: Secondary | ICD-10-CM

## 2023-09-19 DIAGNOSIS — C649 Malignant neoplasm of unspecified kidney, except renal pelvis: Secondary | ICD-10-CM

## 2023-09-19 DIAGNOSIS — Z85528 Personal history of other malignant neoplasm of kidney: Secondary | ICD-10-CM | POA: Diagnosis not present

## 2023-09-19 DIAGNOSIS — Z905 Acquired absence of kidney: Secondary | ICD-10-CM | POA: Diagnosis not present

## 2023-09-19 DIAGNOSIS — N1832 Chronic kidney disease, stage 3b: Secondary | ICD-10-CM | POA: Diagnosis not present

## 2023-09-19 NOTE — Assessment & Plan Note (Addendum)
 Fluid 60-70 oz per day Stable.  Will use contrast next time.  Advised to increase fluid around times of IV contrast.

## 2023-09-19 NOTE — Assessment & Plan Note (Signed)
 Stable pulmonary nodules Repeat CT in February

## 2023-09-19 NOTE — Assessment & Plan Note (Signed)
 No new concerning findings on imaging Will repeat labs and CT in February

## 2023-09-20 ENCOUNTER — Other Ambulatory Visit (HOSPITAL_COMMUNITY): Payer: Self-pay

## 2023-09-23 ENCOUNTER — Encounter: Payer: Self-pay | Admitting: *Deleted

## 2023-09-23 ENCOUNTER — Encounter: Payer: Self-pay | Admitting: Neurology

## 2023-09-23 ENCOUNTER — Ambulatory Visit: Admitting: Neurology

## 2023-09-23 VITALS — BP 128/82 | HR 75 | Ht 68.0 in | Wt 179.0 lb

## 2023-09-23 DIAGNOSIS — H6120 Impacted cerumen, unspecified ear: Secondary | ICD-10-CM | POA: Insufficient documentation

## 2023-09-23 DIAGNOSIS — G4700 Insomnia, unspecified: Secondary | ICD-10-CM | POA: Diagnosis not present

## 2023-09-23 DIAGNOSIS — R0681 Apnea, not elsewhere classified: Secondary | ICD-10-CM

## 2023-09-23 DIAGNOSIS — R634 Abnormal weight loss: Secondary | ICD-10-CM | POA: Insufficient documentation

## 2023-09-23 DIAGNOSIS — Z9189 Other specified personal risk factors, not elsewhere classified: Secondary | ICD-10-CM

## 2023-09-23 DIAGNOSIS — R351 Nocturia: Secondary | ICD-10-CM

## 2023-09-23 DIAGNOSIS — D6869 Other thrombophilia: Secondary | ICD-10-CM | POA: Insufficient documentation

## 2023-09-23 DIAGNOSIS — R0683 Snoring: Secondary | ICD-10-CM | POA: Diagnosis not present

## 2023-09-23 DIAGNOSIS — N529 Male erectile dysfunction, unspecified: Secondary | ICD-10-CM | POA: Insufficient documentation

## 2023-09-23 DIAGNOSIS — I69319 Unspecified symptoms and signs involving cognitive functions following cerebral infarction: Secondary | ICD-10-CM | POA: Insufficient documentation

## 2023-09-23 DIAGNOSIS — I77819 Aortic ectasia, unspecified site: Secondary | ICD-10-CM | POA: Insufficient documentation

## 2023-09-23 DIAGNOSIS — F329 Major depressive disorder, single episode, unspecified: Secondary | ICD-10-CM | POA: Insufficient documentation

## 2023-09-23 DIAGNOSIS — E559 Vitamin D deficiency, unspecified: Secondary | ICD-10-CM | POA: Insufficient documentation

## 2023-09-23 DIAGNOSIS — E663 Overweight: Secondary | ICD-10-CM | POA: Diagnosis not present

## 2023-09-23 DIAGNOSIS — R946 Abnormal results of thyroid function studies: Secondary | ICD-10-CM | POA: Insufficient documentation

## 2023-09-23 DIAGNOSIS — G4719 Other hypersomnia: Secondary | ICD-10-CM | POA: Diagnosis not present

## 2023-09-23 DIAGNOSIS — R059 Cough, unspecified: Secondary | ICD-10-CM | POA: Insufficient documentation

## 2023-09-23 NOTE — Patient Instructions (Signed)

## 2023-09-23 NOTE — Progress Notes (Signed)
 Patient ID: [name] is a 68 y.o. male.  HPI    True Mar, MD, PhD [facility] [address] [address] [place], [place] [address]  Dear Dr. Charlott,   I saw your patient, [name], upon your kind request in my Sleep Clinic today for initial consultation of his sleep disorder, in particular, concern for underlying obstructive sleep apnea.  The patient is accompanied by his wife today.  As you know, Mr. Garriga is a 68 year old male with an underlying complex medical history of chronic kidney disease, kidney stone, kidney cancer with status post nephrectomy, history of hearing loss with bilateral hearing aids, history of endocarditis, stroke, hyperlipidemia, depression, hypertension, coronary artery disease with history of MI, status post cardioversion, status post mitral valve repair, status post tonsillectomy, history of seizure (followed by my colleague, Dr. Gregg), and mildly overweight state, who reports snoring and excessive daytime somnolence, difficulty initiating and maintaining sleep as well as severe nocturia.  His Epworth sleepiness score is 4 out of 24, fatigue severity score is 34 out of 63.  He goes to bed generally around 10:30 PM or 11 and rise time is around 6.  He has been on trazodone  for the past 2 months or so and while it helps him fall asleep it does not help him stay asleep.  He is followed by multiple specialists including nephrology and urology.  He is status post TURP.  He drinks no daily caffeine.  He drinks alcohol  rarely, he is a non-smoker.  He denies morning headaches.  He is not aware of any family history of sleep apnea.  They have 1 dog and 1 cat in the household.  He is retired for 15 years, he was a Clinical research associate.  Sometimes he naps in the afternoons.  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 clear cell renal carcinoma.   Stroke [facility])     His Past Surgical History Is Significant For: Past Surgical History:  Procedure Laterality Date   CARDIAC CATHETERIZATION     CARDIOVERSION N/A 09/14/2020   Procedure: CARDIOVERSION;  Surgeon: Loni Soyla LABOR, MD;  Location: [facility] ENDOSCOPY;  Service: Cardiovascular;  Laterality: N/A;   MITRAL VALVE REPAIR     NO PAST SURGERIES     RIGHT/LEFT HEART CATH AND CORONARY ANGIOGRAPHY N/A 05/11/2020   Procedure: RIGHT/LEFT HEART CATH AND CORONARY ANGIOGRAPHY;  Surgeon: Wonda Sharper, MD;  Location: [facility] INVASIVE CV LAB;  Service: Cardiovascular;  Laterality: N/A;   ROBOT ASSISTED LAPAROSCOPIC NEPHRECTOMY Right 02/22/2016   Procedure: XI ROBOTIC ASSISTED LAPAROSCOPIC  NEPHRECTOMY;  Surgeon: Ricardo Likens, MD;  Location: [facility] ORS;  Service: Urology;  Laterality: Right;   ROBOTIC ASSITED PARTIAL NEPHRECTOMY Left 06/07/2021   Procedure: XI ROBOTIC ASSITED PARTIAL NEPHRECTOMY;  Surgeon: Likens Ricardo, MD;  Location: [facility] ORS;  Service: Urology;  Laterality: Left;  3 HRS   TEE WITHOUT CARDIOVERSION N/A 01/28/2020   Procedure: TRANSESOPHAGEAL ECHOCARDIOGRAM (TEE);  Surgeon: Okey Vina GAILS, MD;  Location: [facility] ENDOSCOPY;  Service: Cardiovascular;  Laterality: N/A;   TONSILLECTOMY     TRANSURETHRAL RESECTION OF PROSTATE N/A 03/28/2022   Procedure:  TRANSURETHRAL RESECTION OF THE PROSTATE (TURP);  Surgeon: Likens Ricardo, MD;  Location: [facility] ORS;  Service: Urology;  Laterality: N/A;    His Family History Is Significant For: Family History  Problem Relation Age of Onset   Heart disease Mother        mom RF as a child (died of heart problem in  59 )   Seizures Neg Hx    Sleep apnea Neg Hx     His Social History Is Significant For: Social History   Socioeconomic History   Marital status: Married    Spouse name: Not on file   Number of children: 3   Years of education: 18   Highest education level: Professional school degree (e.g., MD, DDS, DVM, JD)  Occupational History   Not on file  Tobacco Use   Smoking status: Never   Smokeless tobacco: Never  Vaping Use   Vaping status: Never Used  Substance and Sexual Activity   Alcohol  use: Not Currently    Comment: rarley   Drug use: Not Currently    Types: Marijuana    Comment: Takes CBD gummies before bedtime   Sexual activity: Yes  Other Topics Concern   Not on file  Social History Narrative   MarriedLawyer3 childrenNever smokedAlcohol use--yes   Retired    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 Strain: Not on file  Food Insecurity: No Food Insecurity (07/14/2023)   Hunger Vital Sign    Worried About Running Out of Food in the Last Year: Never true    Ran Out of Food in the Last Year: Never true  Transportation Needs: No Transportation Needs (07/14/2023)   PRAPARE - Administrator, Civil Service (Medical): No    Lack of Transportation (Non-Medical): No  Physical Activity: Not on file  Stress: Not on file  Social Connections: Unknown (07/14/2023)   Social Connection and Isolation Panel    Frequency of Communication with Friends and Family: Patient declined    Frequency of Social Gatherings with Friends and Family: Patient declined    Attends Religious Services: Patient declined    Database administrator or Organizations: Patient declined    Attends  Banker Meetings: Patient declined    Marital Status: Married    His Allergies Are:  No Known Allergies:   His Current Medications Are:  Outpatient Encounter Medications as of 09/23/2023  Medication Sig   amLODipine  (NORVASC ) 2.5 MG tablet Take 1 tablet (2.5 mg total) by mouth daily.   desvenlafaxine (PRISTIQ) 50 MG 24 hr tablet    divalproex  (DEPAKOTE  ER) 500 MG 24 hr tablet Take 2 tablets (1,000 mg total) by mouth at bedtime.   Evolocumab  (REPATHA  SURECLICK) 140 MG/ML SOAJ Inject 140 mg into the skin every 14 (fourteen) days.   ezetimibe  (ZETIA ) 10 MG tablet Take 1 tablet (10 mg total) by mouth daily.   finasteride  (PROSCAR ) 5 MG tablet Take 5  mg by mouth daily.   losartan  (COZAAR ) 50 MG tablet TAKE 1 TABLET BY MOUTH EVERY DAY   oxybutynin  (DITROPAN ) 5 MG tablet Take 5 mg by mouth at bedtime.   pantoprazole  (PROTONIX ) 40 MG tablet Take 40 mg by mouth at bedtime.   traZODone  (DESYREL ) 100 MG tablet TAKE 1 TABLET BY MOUTH EVERYDAY AT BEDTIME   amoxicillin  (AMOXIL ) 500 MG capsule Take 4 capsules (2,000 mg total) by mouth as directed. 4 pills one hour before dental appointment (Patient not taking: Reported on 09/23/2023)   tadalafil (CIALIS) 20 MG tablet Take 20 mg by mouth daily as needed for erectile dysfunction.   No facility-administered encounter medications on file as of 09/23/2023.  :   Review of Systems:  Out of a complete 14 point review of systems, all are reviewed and negative with the exception of these symptoms as listed below: Review of Systems  Neurological:        Pt here for sleep consult Pt snores ,hypertension,headaches,fatigue Pt denies sleep study,cpap machine    ESS:4 FSS:34     Objective:  Neurological Exam  Physical Exam Physical Examination:   Vitals:   09/23/23 1023  BP: 128/82  Pulse: 75    General Examination: The patient is a very pleasant 68 y.o. male in no acute distress. He appears well-developed and well-nourished and well  groomed.   HEENT: Normocephalic, atraumatic, pupils are equal, round and reactive to light, extraocular tracking is well-preserved, face is symmetric, hearing grossly intact with bilateral hearing aids in place.  Airway examination reveals mild mouth dryness, tongue protrudes centrally and palate elevates symmetrically.  He has mild airway crowding secondary to small airway and redundant soft palate.  Tonsils absent.  Neck circumference 16 1 eighths inches, no significant overbite.  No carotid bruits.  Chest: Clear to auscultation without wheezing, rhonchi or crackles noted.  Heart: S1+S2+0, regular and normal without murmurs, rubs or gallops noted.   Abdomen: Soft, non-tender and non-distended.  Extremities: There is trace pitting edema in the left ankle area.  Distal lower extremities bilaterally.   Skin: Warm and dry without trophic changes noted.   Musculoskeletal: exam reveals no obvious joint deformities.   Neurologically:  Mental status: The patient is awake, alert and oriented in all 4 spheres. His immediate and remote memory, attention, language skills and fund of knowledge are appropriate. There is no evidence of aphasia, agnosia, apraxia or anomia. Speech is clear with normal prosody and enunciation. Thought process is linear. Mood is normal and affect is normal.  Cranial nerves II - XII are as described above under HEENT exam.  Motor exam: Normal bulk, moving all 4 extremities without obvious restriction, no obvious action or resting tremor.  Fine motor skills and coordination: grossly intact.  Cerebellar testing: No dysmetria or intention tremor. There is no truncal or gait ataxia.  Sensory exam: intact to light touch in the upper and lower extremities.  Gait, station and balance: He stands easily. No veering to one side is noted. No leaning to one side is noted. Posture is age-appropriate and stance is narrow based. Gait shows normal stride length and normal pace. No problems  turning are noted.   Assessment and plan:  In summary, Ananda Sitzer is a 68 year old male with an underlying complex medical history of chronic kidney disease, kidney stone, kidney cancer with status post nephrectomy, history of hearing loss with bilateral hearing aids, history of endocarditis, stroke, hyperlipidemia, depression, hypertension, coronary artery disease with history of MI, status  post cardioversion, status post mitral valve repair, status post tonsillectomy, history of seizure (followed by my colleague, Dr. Gregg), and mildly overweight state, whose history and physical exam are concerning for sleep disordered breathing, particularly obstructive sleep apnea (OSA). A laboratory attended sleep study is typically considered gold standard for evaluation of sleep disordered breathing.   I had a long chat with the patient and his wife about my findings and the diagnosis of sleep apnea, particularly OSA, its prognosis and treatment options. We talked about medical/conservative treatments, surgical interventions and non-pharmacological approaches for symptom control. I explained, in particular, the risks and ramifications of untreated moderate to severe OSA, especially with respect to developing cardiovascular disease down the road, including congestive heart failure (CHF), difficult to treat hypertension, cardiac arrhythmias (particularly A-fib), neurovascular complications including TIA, stroke and dementia. Even type 2 diabetes has, in part, been linked to untreated OSA. Symptoms of untreated OSA may include (but may not be limited to) daytime sleepiness, nocturia (i.e. frequent nighttime urination), memory problems, mood irritability and suboptimally controlled or worsening mood disorder such as depression and/or anxiety, lack of energy, lack of motivation, physical discomfort, as well as recurrent headaches, especially morning or nocturnal headaches.  I recommended a sleep study at this time. I  outlined the differences between a laboratory attended sleep study which is considered more comprehensive and accurate over the option of a home sleep test (HST); the latter may lead to underestimation of sleep disordered breathing in some instances and does not help with diagnosing upper airway resistance syndrome and is not accurate enough to diagnose primary central sleep apnea typically. I outlined possible surgical and non-surgical treatment options of OSA, including the use of a positive airway pressure (PAP) device (i.e. CPAP, AutoPAP/APAP or BiPAP in certain circumstances), a custom-made dental device (aka oral appliance, which would require a referral to a specialist dentist or orthodontist typically, and is generally speaking not considered for patients with full dentures or edentulous state), upper airway surgical options, such as traditional UPPP (which is not considered a first-line treatment) or the Inspire device (hypoglossal nerve stimulator, which would involve a referral for consultation with an ENT surgeon, after careful selection, following inclusion criteria - also not first-line treatment). I explained the PAP treatment option to the patient in detail, as this is generally considered first-line treatment.  The patient indicated that he would be reluctant but willing to try PAP therapy, if the need arises. I explained the importance of being compliant with PAP treatment, not only for insurance purposes but primarily to improve patient's symptoms symptoms, and for the patient's long term health benefit, including to reduce His cardiovascular risks longer-term.    We will pick up our discussion about the next steps and treatment options after testing.  We will keep them posted as to the test results by phone call and/or MyChart messaging where possible.  We will plan to follow-up in sleep clinic accordingly as well.  I answered all their questions today and the patient and his wife were in  agreement.   I encouraged him to call with any interim questions, concerns, problems or updates or email us  through MyChart.  Generally speaking, sleep test authorizations may take up to 2 weeks, sometimes less, sometimes longer, the patient is encouraged to get in touch with us  if they do not hear back from the sleep lab staff directly within the next 2 weeks.  Thank you very much for allowing me to participate in the care of this  nice patient. If I can be of any further assistance to you please do not hesitate to call me at [phone].  Sincerely,   True Mar, MD, PhD

## 2023-09-25 ENCOUNTER — Telehealth: Payer: Self-pay | Admitting: Neurology

## 2023-09-25 NOTE — Telephone Encounter (Signed)
 [employer] pending.

## 2023-09-30 NOTE — Telephone Encounter (Signed)
 [employer] shara: [id number] (exp. 09/25/23 to 03/23/24)

## 2023-10-02 NOTE — Telephone Encounter (Signed)
 [employer] shara: [id number] (exp. 09/25/23 to 03/23/24)  Patient is scheduled at [facility] for 11/13/23 at 9 pm.  Mailed packet and sent mychart.

## 2023-11-13 ENCOUNTER — Ambulatory Visit: Admitting: Neurology

## 2023-11-13 DIAGNOSIS — G4719 Other hypersomnia: Secondary | ICD-10-CM

## 2023-11-13 DIAGNOSIS — E663 Overweight: Secondary | ICD-10-CM

## 2023-11-13 DIAGNOSIS — G4700 Insomnia, unspecified: Secondary | ICD-10-CM

## 2023-11-13 DIAGNOSIS — G4720 Circadian rhythm sleep disorder, unspecified type: Secondary | ICD-10-CM

## 2023-11-13 DIAGNOSIS — G4733 Obstructive sleep apnea (adult) (pediatric): Secondary | ICD-10-CM | POA: Diagnosis not present

## 2023-11-13 DIAGNOSIS — R9431 Abnormal electrocardiogram [ECG] [EKG]: Secondary | ICD-10-CM

## 2023-11-13 DIAGNOSIS — Z9189 Other specified personal risk factors, not elsewhere classified: Secondary | ICD-10-CM

## 2023-11-13 DIAGNOSIS — R0683 Snoring: Secondary | ICD-10-CM

## 2023-11-13 DIAGNOSIS — R351 Nocturia: Secondary | ICD-10-CM

## 2023-11-13 DIAGNOSIS — R0681 Apnea, not elsewhere classified: Secondary | ICD-10-CM

## 2023-11-14 NOTE — Procedures (Signed)
 Physician Interpretation: Please see link under Procedure Tab or under Encounters tab for physician report, technical report, as well as O2 titration and/or PAP titration tables (if applicable).

## 2023-11-21 ENCOUNTER — Ambulatory Visit: Payer: Self-pay | Admitting: Neurology

## 2023-11-21 ENCOUNTER — Encounter: Payer: Self-pay | Admitting: Neurology

## 2023-11-21 DIAGNOSIS — G4733 Obstructive sleep apnea (adult) (pediatric): Secondary | ICD-10-CM

## 2023-11-22 ENCOUNTER — Other Ambulatory Visit: Payer: Self-pay | Admitting: Neurology

## 2023-11-22 MED ORDER — LEVETIRACETAM 500 MG PO TABS: 500.0000 mg | ORAL_TABLET | Freq: Two times a day (BID) | ORAL | 11 refills | Status: AC

## 2023-12-17 NOTE — Telephone Encounter (Signed)
 Tried calling patient on 11/25: no answer, LVM.  Sent mychart msg.

## 2023-12-18 NOTE — Telephone Encounter (Signed)
 Just notified that patient's insurance is changing Jan 02, 2024 to [facility] HMO. Only [employer] has contract with that insurance. Will forward to them.

## 2023-12-18 NOTE — Progress Notes (Signed)
 Sent community message to [facility] for set-up asap. Forwarded note to PCP.

## 2023-12-24 DIAGNOSIS — G40909 Epilepsy, unspecified, not intractable, without status epilepticus: Secondary | ICD-10-CM | POA: Diagnosis not present

## 2023-12-24 DIAGNOSIS — R918 Other nonspecific abnormal finding of lung field: Secondary | ICD-10-CM | POA: Diagnosis not present

## 2023-12-24 DIAGNOSIS — N1831 Chronic kidney disease, stage 3a: Secondary | ICD-10-CM | POA: Diagnosis not present

## 2023-12-24 DIAGNOSIS — C642 Malignant neoplasm of left kidney, except renal pelvis: Secondary | ICD-10-CM | POA: Diagnosis not present

## 2023-12-24 DIAGNOSIS — D631 Anemia in chronic kidney disease: Secondary | ICD-10-CM | POA: Diagnosis not present

## 2023-12-24 DIAGNOSIS — C641 Malignant neoplasm of right kidney, except renal pelvis: Secondary | ICD-10-CM | POA: Diagnosis not present

## 2023-12-24 DIAGNOSIS — I129 Hypertensive chronic kidney disease with stage 1 through stage 4 chronic kidney disease, or unspecified chronic kidney disease: Secondary | ICD-10-CM | POA: Diagnosis not present

## 2024-01-17 ENCOUNTER — Encounter: Payer: Self-pay | Admitting: Neurology

## 2024-01-31 ENCOUNTER — Other Ambulatory Visit: Payer: Self-pay | Admitting: Neurology

## 2024-02-24 ENCOUNTER — Ambulatory Visit (HOSPITAL_COMMUNITY)

## 2024-03-18 ENCOUNTER — Ambulatory Visit: Admitting: Neurology
